# Patient Record
Sex: Female | Born: 1970
Health system: Southern US, Community
[De-identification: ages and names within clinical notes are randomized; demographics above are authoritative.]

## PROBLEM LIST (undated history)

## (undated) DIAGNOSIS — B029 Zoster without complications: Secondary | ICD-10-CM

## (undated) DIAGNOSIS — E7800 Pure hypercholesterolemia, unspecified: Secondary | ICD-10-CM

## (undated) DIAGNOSIS — F419 Anxiety disorder, unspecified: Secondary | ICD-10-CM

## (undated) DIAGNOSIS — E282 Polycystic ovarian syndrome: Secondary | ICD-10-CM

## (undated) DIAGNOSIS — I10 Essential (primary) hypertension: Secondary | ICD-10-CM

## (undated) DIAGNOSIS — F329 Major depressive disorder, single episode, unspecified: Secondary | ICD-10-CM

## (undated) DIAGNOSIS — M1990 Unspecified osteoarthritis, unspecified site: Secondary | ICD-10-CM

## (undated) DIAGNOSIS — N189 Chronic kidney disease, unspecified: Secondary | ICD-10-CM

## (undated) DIAGNOSIS — Z8619 Personal history of other infectious and parasitic diseases: Secondary | ICD-10-CM

## (undated) DIAGNOSIS — F32A Depression, unspecified: Secondary | ICD-10-CM

## (undated) DIAGNOSIS — E739 Lactose intolerance, unspecified: Secondary | ICD-10-CM

## (undated) DIAGNOSIS — M549 Dorsalgia, unspecified: Secondary | ICD-10-CM

## (undated) DIAGNOSIS — M2550 Pain in unspecified joint: Secondary | ICD-10-CM

## (undated) DIAGNOSIS — J302 Other seasonal allergic rhinitis: Secondary | ICD-10-CM

## (undated) DIAGNOSIS — G4730 Sleep apnea, unspecified: Secondary | ICD-10-CM

## (undated) DIAGNOSIS — K5792 Diverticulitis of intestine, part unspecified, without perforation or abscess without bleeding: Secondary | ICD-10-CM

## (undated) DIAGNOSIS — E119 Type 2 diabetes mellitus without complications: Secondary | ICD-10-CM

## (undated) DIAGNOSIS — E785 Hyperlipidemia, unspecified: Secondary | ICD-10-CM

## (undated) DIAGNOSIS — E1165 Type 2 diabetes mellitus with hyperglycemia: Secondary | ICD-10-CM

## (undated) HISTORY — DX: Chronic kidney disease, unspecified: N18.9

## (undated) HISTORY — DX: Type 2 diabetes mellitus without complications: E11.9

## (undated) HISTORY — DX: Major depressive disorder, single episode, unspecified: F32.9

## (undated) HISTORY — DX: Anxiety disorder, unspecified: F41.9

## (undated) HISTORY — DX: Diverticulitis of intestine, part unspecified, without perforation or abscess without bleeding: K57.92

## (undated) HISTORY — DX: Polycystic ovarian syndrome: E28.2

## (undated) HISTORY — DX: Dorsalgia, unspecified: M54.9

## (undated) HISTORY — DX: Lactose intolerance, unspecified: E73.9

## (undated) HISTORY — DX: Depression, unspecified: F32.A

## (undated) HISTORY — DX: Essential (primary) hypertension: I10

## (undated) HISTORY — DX: Other seasonal allergic rhinitis: J30.2

## (undated) HISTORY — DX: Zoster without complications: B02.9

## (undated) HISTORY — DX: Type 2 diabetes mellitus with hyperglycemia: E11.65

## (undated) HISTORY — DX: Hyperlipidemia, unspecified: E78.5

## (undated) HISTORY — DX: Pain in unspecified joint: M25.50

## (undated) HISTORY — DX: Unspecified osteoarthritis, unspecified site: M19.90

## (undated) HISTORY — DX: Personal history of other infectious and parasitic diseases: Z86.19

---
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
## 1998-12-23 ENCOUNTER — Other Ambulatory Visit: Admission: RE | Admit: 1998-12-23 | Discharge: 1998-12-23 | Payer: Self-pay | Admitting: Internal Medicine

## 1998-12-30 ENCOUNTER — Encounter: Admission: RE | Admit: 1998-12-30 | Discharge: 1999-03-30 | Payer: Self-pay | Admitting: Internal Medicine

## 2000-01-09 ENCOUNTER — Other Ambulatory Visit: Admission: RE | Admit: 2000-01-09 | Discharge: 2000-01-09 | Payer: Self-pay | Admitting: Internal Medicine

## 2000-01-17 ENCOUNTER — Encounter: Admission: RE | Admit: 2000-01-17 | Discharge: 2000-04-16 | Payer: Self-pay | Admitting: Internal Medicine

## 2000-12-10 ENCOUNTER — Emergency Department (HOSPITAL_COMMUNITY): Admission: EM | Admit: 2000-12-10 | Discharge: 2000-12-10 | Payer: Self-pay | Admitting: Internal Medicine

## 2001-02-03 ENCOUNTER — Encounter: Payer: Self-pay | Admitting: Emergency Medicine

## 2001-02-03 ENCOUNTER — Emergency Department (HOSPITAL_COMMUNITY): Admission: EM | Admit: 2001-02-03 | Discharge: 2001-02-03 | Payer: Self-pay | Admitting: Unknown Physician Specialty

## 2003-07-02 ENCOUNTER — Encounter (INDEPENDENT_AMBULATORY_CARE_PROVIDER_SITE_OTHER): Payer: Self-pay | Admitting: Specialist

## 2003-07-02 ENCOUNTER — Encounter: Admission: RE | Admit: 2003-07-02 | Discharge: 2003-07-02 | Payer: Self-pay | Admitting: Family Medicine

## 2003-07-05 ENCOUNTER — Encounter: Admission: RE | Admit: 2003-07-05 | Discharge: 2003-07-05 | Payer: Self-pay | Admitting: Family Medicine

## 2003-08-02 ENCOUNTER — Encounter: Admission: RE | Admit: 2003-08-02 | Discharge: 2003-10-31 | Payer: Self-pay | Admitting: Family Medicine

## 2003-10-07 ENCOUNTER — Encounter: Admission: RE | Admit: 2003-10-07 | Discharge: 2003-10-07 | Payer: Self-pay | Admitting: Family Medicine

## 2003-12-29 ENCOUNTER — Encounter: Admission: RE | Admit: 2003-12-29 | Discharge: 2003-12-29 | Payer: Self-pay | Admitting: Sports Medicine

## 2004-03-27 ENCOUNTER — Ambulatory Visit: Payer: Self-pay | Admitting: Sports Medicine

## 2004-05-18 ENCOUNTER — Emergency Department (HOSPITAL_COMMUNITY): Admission: EM | Admit: 2004-05-18 | Discharge: 2004-05-18 | Payer: Self-pay | Admitting: Family Medicine

## 2004-06-01 ENCOUNTER — Ambulatory Visit: Payer: Self-pay | Admitting: Family Medicine

## 2004-07-04 ENCOUNTER — Ambulatory Visit: Payer: Self-pay | Admitting: Family Medicine

## 2004-11-02 ENCOUNTER — Ambulatory Visit: Payer: Self-pay | Admitting: Sports Medicine

## 2004-11-06 ENCOUNTER — Emergency Department (HOSPITAL_COMMUNITY): Admission: EM | Admit: 2004-11-06 | Discharge: 2004-11-06 | Payer: Self-pay | Admitting: Family Medicine

## 2005-02-26 ENCOUNTER — Ambulatory Visit: Payer: Self-pay | Admitting: Family Medicine

## 2005-03-13 ENCOUNTER — Ambulatory Visit: Payer: Self-pay | Admitting: Family Medicine

## 2005-03-18 ENCOUNTER — Emergency Department (HOSPITAL_COMMUNITY): Admission: EM | Admit: 2005-03-18 | Discharge: 2005-03-18 | Payer: Self-pay | Admitting: Emergency Medicine

## 2005-03-26 ENCOUNTER — Ambulatory Visit: Payer: Self-pay | Admitting: Sports Medicine

## 2005-04-02 ENCOUNTER — Ambulatory Visit: Payer: Self-pay | Admitting: Family Medicine

## 2005-04-30 ENCOUNTER — Ambulatory Visit: Payer: Self-pay | Admitting: Family Medicine

## 2005-07-21 ENCOUNTER — Emergency Department (HOSPITAL_COMMUNITY): Admission: EM | Admit: 2005-07-21 | Discharge: 2005-07-21 | Payer: Self-pay | Admitting: Family Medicine

## 2005-07-31 ENCOUNTER — Emergency Department (HOSPITAL_COMMUNITY): Admission: EM | Admit: 2005-07-31 | Discharge: 2005-07-31 | Payer: Self-pay | Admitting: Family Medicine

## 2006-04-27 ENCOUNTER — Encounter (INDEPENDENT_AMBULATORY_CARE_PROVIDER_SITE_OTHER): Payer: Self-pay | Admitting: *Deleted

## 2006-04-27 LAB — CONVERTED CEMR LAB

## 2006-04-28 ENCOUNTER — Emergency Department (HOSPITAL_COMMUNITY): Admission: EM | Admit: 2006-04-28 | Discharge: 2006-04-28 | Payer: Self-pay | Admitting: Family Medicine

## 2006-04-29 ENCOUNTER — Ambulatory Visit: Payer: Self-pay | Admitting: Sports Medicine

## 2006-05-15 ENCOUNTER — Ambulatory Visit: Payer: Self-pay | Admitting: Family Medicine

## 2006-05-15 ENCOUNTER — Other Ambulatory Visit: Admission: RE | Admit: 2006-05-15 | Discharge: 2006-05-15 | Payer: Self-pay | Admitting: Family Medicine

## 2006-05-28 HISTORY — PX: CHOLECYSTECTOMY: SHX55

## 2006-06-20 ENCOUNTER — Encounter: Admission: RE | Admit: 2006-06-20 | Discharge: 2006-09-18 | Payer: Self-pay | Admitting: Family Medicine

## 2006-06-21 ENCOUNTER — Emergency Department (HOSPITAL_COMMUNITY): Admission: EM | Admit: 2006-06-21 | Discharge: 2006-06-21 | Payer: Self-pay | Admitting: Family Medicine

## 2006-07-18 ENCOUNTER — Ambulatory Visit: Payer: Self-pay | Admitting: Family Medicine

## 2006-07-26 ENCOUNTER — Encounter (INDEPENDENT_AMBULATORY_CARE_PROVIDER_SITE_OTHER): Payer: Self-pay | Admitting: *Deleted

## 2006-07-30 ENCOUNTER — Ambulatory Visit: Payer: Self-pay | Admitting: Family Medicine

## 2006-07-30 ENCOUNTER — Encounter: Payer: Self-pay | Admitting: Pharmacist

## 2006-07-30 DIAGNOSIS — IMO0002 Reserved for concepts with insufficient information to code with codable children: Secondary | ICD-10-CM | POA: Insufficient documentation

## 2006-07-30 DIAGNOSIS — E1165 Type 2 diabetes mellitus with hyperglycemia: Secondary | ICD-10-CM

## 2006-07-30 DIAGNOSIS — I10 Essential (primary) hypertension: Secondary | ICD-10-CM | POA: Insufficient documentation

## 2006-07-30 HISTORY — DX: Reserved for concepts with insufficient information to code with codable children: IMO0002

## 2006-07-30 HISTORY — DX: Type 2 diabetes mellitus with hyperglycemia: E11.65

## 2006-08-02 ENCOUNTER — Encounter (INDEPENDENT_AMBULATORY_CARE_PROVIDER_SITE_OTHER): Payer: Self-pay | Admitting: Family Medicine

## 2006-08-02 ENCOUNTER — Ambulatory Visit: Payer: Self-pay | Admitting: Sports Medicine

## 2006-08-02 LAB — CONVERTED CEMR LAB
Cholesterol: 211 mg/dL — ABNORMAL HIGH (ref 0–200)
HDL: 65 mg/dL (ref >39)
Hgb A1c MFr Bld: 8.8 %
LDL Cholesterol: 122 mg/dL — ABNORMAL HIGH (ref 0–99)
Total CHOL/HDL Ratio: 3.2
Triglycerides: 119 mg/dL (ref <150)
VLDL: 24 mg/dL (ref 0–40)

## 2006-08-06 ENCOUNTER — Encounter (INDEPENDENT_AMBULATORY_CARE_PROVIDER_SITE_OTHER): Payer: Self-pay | Admitting: Family Medicine

## 2006-08-06 DIAGNOSIS — E785 Hyperlipidemia, unspecified: Secondary | ICD-10-CM | POA: Insufficient documentation

## 2006-09-06 ENCOUNTER — Telehealth (INDEPENDENT_AMBULATORY_CARE_PROVIDER_SITE_OTHER): Payer: Self-pay | Admitting: Family Medicine

## 2006-09-22 ENCOUNTER — Emergency Department (HOSPITAL_COMMUNITY): Admission: EM | Admit: 2006-09-22 | Discharge: 2006-09-22 | Payer: Self-pay | Admitting: Emergency Medicine

## 2006-09-25 ENCOUNTER — Telehealth: Payer: Self-pay | Admitting: *Deleted

## 2006-09-26 ENCOUNTER — Ambulatory Visit: Payer: Self-pay | Admitting: Sports Medicine

## 2006-09-30 ENCOUNTER — Encounter (INDEPENDENT_AMBULATORY_CARE_PROVIDER_SITE_OTHER): Payer: Self-pay | Admitting: Family Medicine

## 2006-10-03 ENCOUNTER — Ambulatory Visit: Payer: Self-pay | Admitting: Family Medicine

## 2006-10-03 LAB — CONVERTED CEMR LAB

## 2006-10-08 ENCOUNTER — Telehealth: Payer: Self-pay | Admitting: *Deleted

## 2006-10-14 ENCOUNTER — Ambulatory Visit (HOSPITAL_COMMUNITY): Admission: RE | Admit: 2006-10-14 | Discharge: 2006-10-14 | Payer: Self-pay | Admitting: Family Medicine

## 2006-10-14 ENCOUNTER — Encounter (INDEPENDENT_AMBULATORY_CARE_PROVIDER_SITE_OTHER): Payer: Self-pay | Admitting: Family Medicine

## 2006-10-14 ENCOUNTER — Ambulatory Visit: Payer: Self-pay | Admitting: Family Medicine

## 2006-10-17 ENCOUNTER — Encounter: Payer: Self-pay | Admitting: *Deleted

## 2006-10-29 ENCOUNTER — Encounter: Payer: Self-pay | Admitting: *Deleted

## 2007-01-02 ENCOUNTER — Ambulatory Visit: Payer: Self-pay | Admitting: Family Medicine

## 2007-01-03 ENCOUNTER — Encounter: Payer: Self-pay | Admitting: Family Medicine

## 2007-02-03 ENCOUNTER — Ambulatory Visit: Payer: Self-pay | Admitting: Sports Medicine

## 2007-02-03 ENCOUNTER — Encounter: Payer: Self-pay | Admitting: Family Medicine

## 2007-02-03 LAB — CONVERTED CEMR LAB
BUN: 12 mg/dL (ref 6–23)
Calcium: 9.1 mg/dL (ref 8.4–10.5)
Cholesterol: 186 mg/dL (ref 0–200)
Creatinine, Ser: 0.69 mg/dL (ref 0.40–1.20)
Glucose, Bld: 136 mg/dL — ABNORMAL HIGH (ref 70–99)
Hgb A1c MFr Bld: 8.5 %
Triglycerides: 135 mg/dL (ref <150)

## 2007-02-11 ENCOUNTER — Ambulatory Visit: Payer: Self-pay | Admitting: Family Medicine

## 2007-02-11 LAB — CONVERTED CEMR LAB
HDL goal, serum: 40 mg/dL
LDL Goal: 100 mg/dL

## 2007-02-28 ENCOUNTER — Encounter: Payer: Self-pay | Admitting: Family Medicine

## 2007-03-12 ENCOUNTER — Ambulatory Visit: Payer: Self-pay | Admitting: Family Medicine

## 2007-06-27 ENCOUNTER — Telehealth: Payer: Self-pay | Admitting: *Deleted

## 2007-07-02 ENCOUNTER — Encounter: Payer: Self-pay | Admitting: Family Medicine

## 2008-01-02 ENCOUNTER — Encounter: Payer: Self-pay | Admitting: Family Medicine

## 2008-01-27 ENCOUNTER — Telehealth: Payer: Self-pay | Admitting: *Deleted

## 2008-02-18 ENCOUNTER — Telehealth: Payer: Self-pay | Admitting: *Deleted

## 2008-02-18 ENCOUNTER — Encounter: Payer: Self-pay | Admitting: Family Medicine

## 2008-02-18 ENCOUNTER — Ambulatory Visit: Payer: Self-pay | Admitting: Family Medicine

## 2008-02-18 LAB — CONVERTED CEMR LAB
Calcium: 9.3 mg/dL (ref 8.4–10.5)
Cholesterol: 261 mg/dL — ABNORMAL HIGH (ref 0–200)
Glucose, Bld: 155 mg/dL — ABNORMAL HIGH (ref 70–99)
HDL: 53 mg/dL (ref >39)
Hgb A1c MFr Bld: 10 %
Sodium: 138 meq/L (ref 135–145)
Total CHOL/HDL Ratio: 4.9
Triglycerides: 103 mg/dL (ref <150)

## 2008-02-20 ENCOUNTER — Telehealth (INDEPENDENT_AMBULATORY_CARE_PROVIDER_SITE_OTHER): Payer: Self-pay | Admitting: Family Medicine

## 2008-02-20 ENCOUNTER — Ambulatory Visit (HOSPITAL_COMMUNITY): Admission: RE | Admit: 2008-02-20 | Discharge: 2008-02-20 | Payer: Self-pay | Admitting: Family Medicine

## 2008-02-20 DIAGNOSIS — K8020 Calculus of gallbladder without cholecystitis without obstruction: Secondary | ICD-10-CM | POA: Insufficient documentation

## 2008-02-23 ENCOUNTER — Encounter (INDEPENDENT_AMBULATORY_CARE_PROVIDER_SITE_OTHER): Payer: Self-pay | Admitting: *Deleted

## 2008-02-24 ENCOUNTER — Telehealth: Payer: Self-pay | Admitting: *Deleted

## 2008-03-10 ENCOUNTER — Encounter: Payer: Self-pay | Admitting: Family Medicine

## 2008-03-17 ENCOUNTER — Ambulatory Visit: Payer: Self-pay | Admitting: Family Medicine

## 2008-03-17 ENCOUNTER — Encounter: Payer: Self-pay | Admitting: Family Medicine

## 2008-03-17 LAB — CONVERTED CEMR LAB: Whiff Test: NEGATIVE

## 2008-03-20 ENCOUNTER — Encounter: Payer: Self-pay | Admitting: Family Medicine

## 2008-03-23 ENCOUNTER — Telehealth: Payer: Self-pay | Admitting: Family Medicine

## 2008-03-24 ENCOUNTER — Encounter: Payer: Self-pay | Admitting: Family Medicine

## 2008-03-30 ENCOUNTER — Telehealth: Payer: Self-pay | Admitting: *Deleted

## 2008-04-02 ENCOUNTER — Encounter: Payer: Self-pay | Admitting: Family Medicine

## 2008-04-05 ENCOUNTER — Encounter: Payer: Self-pay | Admitting: Family Medicine

## 2008-04-07 ENCOUNTER — Encounter: Payer: Self-pay | Admitting: Family Medicine

## 2008-04-09 ENCOUNTER — Encounter (INDEPENDENT_AMBULATORY_CARE_PROVIDER_SITE_OTHER): Payer: Self-pay | Admitting: General Surgery

## 2008-04-09 ENCOUNTER — Ambulatory Visit (HOSPITAL_COMMUNITY): Admission: RE | Admit: 2008-04-09 | Discharge: 2008-04-10 | Payer: Self-pay | Admitting: General Surgery

## 2008-05-04 ENCOUNTER — Telehealth: Payer: Self-pay | Admitting: *Deleted

## 2008-07-02 ENCOUNTER — Encounter: Payer: Self-pay | Admitting: Family Medicine

## 2008-12-23 ENCOUNTER — Encounter: Payer: Self-pay | Admitting: Family Medicine

## 2009-10-27 ENCOUNTER — Encounter: Payer: Self-pay | Admitting: Family Medicine

## 2010-10-10 NOTE — Op Note (Signed)
Audrey Goodman, Audrey Goodman                ACCOUNT NO.:  [id number]   MEDICAL RECORD NO.:  [id number]          PATIENT TYPE:  OIB   LOCATION:  1523                         FAC[facility]  [facility]   PHYSICIAN:  Juanetta Gosling, MDDATE OF BIRTH:  1970-07-09   DATE OF PROCEDURE:  04/09/2008  DATE OF DISCHARGE:                               OPERATIVE REPORT   PREOPERATIVE DIAGNOSES:  Symptomatic cholelithiasis.   POSTOPERATIVE DIAGNOSES:  Symptomatic cholelithiasis.   PROCEDURE:  Laparoscopic cholecystectomy.   SURGEON:  Juanetta Gosling, M.D.   ASSISTANT:  Dr. Felicity Pellegrini   ANESTHESIA:  General.   FINDINGS:  Chronic inflammation of gallbladder.   SPECIMEN:  Gallbladder and contents to pathology.   ESTIMATED BLOOD LOSS:  Minimal.   COMPLICATIONS:  None.   DRAINS:  None.   DISPOSITION:  To PACU in stable condition.   INDICATIONS:  Audrey Goodman is a morbidly obese, 40 year old female with  a history of hypertension, diabetes, hypercholesterolemia, who has had  intermittent right upper quadrant pain for several months.  This has  been steadily getting worse over this time.  She has an ultrasound that  shows multiple gallstones with no Murphy's sign and gallbladder wall not  thickened.  Common duct was 4 mm.  Her LFTs were normal preoperatively.  Counseled her for laparoscopic cholecystectomy for symptomatic  cholelithiasis.   PROCEDURE:  After informed consent was obtained, the patient was taken  to the operating room.  She was administered 1 g of cefoxitin  preoperatively as well as 5000 units of subcutaneous heparin due to her  morbid obesity.  Sequential compression devices were placed on her lower  extremities.  She then underwent general orotracheal anesthesia with  some difficulty, as her mouth did not open very well.  Following this,  her abdomen was then prepped and draped in standard sterile surgical  fashion.  Surgical time-out was then performed.  Due to her size, I  made  a 5 mm incision in her left upper quadrant and used a 5-mm scope with an  Optiview trocar and entered the abdomen without difficulty.  The abdomen  was then insufflated to 15 mmHg pressure without complication.  I then  made a 10-mm infraumbilical vertical incision and then inserted a 10 mm  trocar under direct vision without complication.  Three other 5 mm  trocars were placed in the epigastrium and right upper quadrant after  infiltration with local anesthetic under direct vision without  complication.   Her gallbladder was then retracted cephalad and lateral.  The  gallbladder was intrahepatic and noted to be scarred in the region of  the triangle of Calot, as well as to the liver throughout, indicating  chronic cholecystitis.  The triangle of Calot was then dissected.  The  cystic artery and cystic duct were completely dissected and the critical  view of safety was obtained with these two structures entering into the  gallbladder with the liver behind that.  Following this dissection, the  cystic duct was then clipped and divided and the cystic artery was then  clipped and divided.  Bovie  electrocautery was then used to remove the  gallbladder from the liver bed.  This was very adherent to the liver and  the liver was entered in several positions during this dissection.  The  gallbladder was then removed.  The gallbladder bed was then visualized.  Electrocautery was then used to control several points of bleeding.  This was then irrigated clear.  No further bleeding was noted.  The  gallbladder was then removed from the liver bed with electrocautery.  A  5-mm scope was then inserted into the epigastrium and an EndoCatch was  inserted through the umbilicus.  The gallbladder was placed in the  umbilicus and this was then removed after enlarging the prior fascial  hole.  This was passed off the table as a specimen.  Following this,  further irrigation was performed.  Hemostasis  was observed.  All the  irrigant was suctioned from the abdomen.  An Endoclose device with a 0  Vicryl suture was then used to close the 10-mm infraumbilical port and  two stitches were used to do this.  Following this, all trocars were  removed.  The abdomen was then desufflated.  All of the trocar sites  were then closed with a 4-0 Monocryl in subcuticular fashion.  Dermabond  was placed over this.  She tolerated this procedure well, was  transferred to the recovery room in stable condition.      Juanetta Gosling, MD  Electronically Signed     MCW/MEDQ  D:  04/09/2008  T:  04/09/2008  Job:  [id number]   cc:   Pearlean Brownie, M.D.

## 2011-02-27 LAB — GLUCOSE, CAPILLARY
Glucose-Capillary: 112 — ABNORMAL HIGH
Glucose-Capillary: 114 — ABNORMAL HIGH
Glucose-Capillary: 150 — ABNORMAL HIGH
Glucose-Capillary: 229 — ABNORMAL HIGH
Glucose-Capillary: 331 — ABNORMAL HIGH

## 2011-02-27 LAB — COMPREHENSIVE METABOLIC PANEL
AST: 17
Alkaline Phosphatase: 72
CO2: 28
Chloride: 102
Creatinine, Ser: 0.83
GFR calc Af Amer: >60
GFR calc non Af Amer: >60
Potassium: 3.7
Total Bilirubin: 0.7

## 2011-02-27 LAB — CBC
HCT: 42
MCV: 88.2
RBC: 4.76
WBC: 8.5

## 2011-02-27 LAB — PREGNANCY, URINE: Preg Test, Ur: NEGATIVE

## 2011-02-27 LAB — DIFFERENTIAL
Basophils Absolute: 0.1
Basophils Relative: 1
Eosinophils Absolute: 0.2
Eosinophils Relative: 2
Lymphocytes Relative: 32
Monocytes Absolute: 0.6

## 2012-09-02 LAB — HM PAP SMEAR: HM PAP: NORMAL

## 2013-04-14 ENCOUNTER — Telehealth: Payer: Self-pay

## 2013-04-14 NOTE — Telephone Encounter (Signed)
New Patient Left message for call back Non identifiable  

## 2013-04-15 ENCOUNTER — Ambulatory Visit: Payer: Self-pay | Admitting: Family Medicine

## 2013-04-15 DIAGNOSIS — Z0289 Encounter for other administrative examinations: Secondary | ICD-10-CM

## 2013-04-16 NOTE — Telephone Encounter (Signed)
Unable to reach pre visit//no show 

## 2013-07-14 ENCOUNTER — Ambulatory Visit: Payer: Self-pay | Admitting: Family Medicine

## 2013-09-09 ENCOUNTER — Telehealth: Payer: Self-pay

## 2013-09-09 NOTE — Telephone Encounter (Signed)
Number has been disconnected.

## 2013-09-10 ENCOUNTER — Encounter: Payer: Self-pay | Admitting: Family Medicine

## 2013-09-10 ENCOUNTER — Ambulatory Visit (INDEPENDENT_AMBULATORY_CARE_PROVIDER_SITE_OTHER): Payer: 59 | Admitting: Family Medicine

## 2013-09-10 ENCOUNTER — Encounter: Payer: Self-pay | Admitting: General Practice

## 2013-09-10 ENCOUNTER — Other Ambulatory Visit: Payer: Self-pay | Admitting: Family Medicine

## 2013-09-10 VITALS — BP 132/86 | HR 81 | Temp 98.2°F | Resp 16 | Ht 65.75 in | Wt 396.1 lb

## 2013-09-10 DIAGNOSIS — E282 Polycystic ovarian syndrome: Secondary | ICD-10-CM | POA: Insufficient documentation

## 2013-09-10 DIAGNOSIS — E1165 Type 2 diabetes mellitus with hyperglycemia: Principal | ICD-10-CM

## 2013-09-10 DIAGNOSIS — IMO0001 Reserved for inherently not codable concepts without codable children: Secondary | ICD-10-CM

## 2013-09-10 DIAGNOSIS — E785 Hyperlipidemia, unspecified: Secondary | ICD-10-CM

## 2013-09-10 DIAGNOSIS — I10 Essential (primary) hypertension: Secondary | ICD-10-CM

## 2013-09-10 LAB — BASIC METABOLIC PANEL
BUN: 15 mg/dL (ref 6–23)
CO2: 27 mEq/L (ref 19–32)
Calcium: 8.9 mg/dL (ref 8.4–10.5)
Chloride: 100 mEq/L (ref 96–112)
Creatinine, Ser: 0.8 mg/dL (ref 0.4–1.2)
GFR: 97.04 mL/min (ref >60.00)
GLUCOSE: 199 mg/dL — AB (ref 70–99)
POTASSIUM: 3.8 meq/L (ref 3.5–5.1)
Sodium: 136 mEq/L (ref 135–145)

## 2013-09-10 LAB — CBC WITH DIFFERENTIAL/PLATELET
BASOS ABS: 0 10*3/uL (ref 0.0–0.1)
Basophils Relative: 0.5 % (ref 0.0–3.0)
Eosinophils Absolute: 0.3 10*3/uL (ref 0.0–0.7)
Eosinophils Relative: 3.6 % (ref 0.0–5.0)
HCT: 39.8 % (ref 36.0–46.0)
HEMOGLOBIN: 13.2 g/dL (ref 12.0–15.0)
LYMPHS PCT: 33.9 % (ref 12.0–46.0)
Lymphs Abs: 2.5 10*3/uL (ref 0.7–4.0)
MCHC: 33.1 g/dL (ref 30.0–36.0)
MCV: 87.5 fl (ref 78.0–100.0)
MONOS PCT: 6.9 % (ref 3.0–12.0)
Monocytes Absolute: 0.5 10*3/uL (ref 0.1–1.0)
NEUTROS PCT: 55.1 % (ref 43.0–77.0)
Neutro Abs: 4.1 10*3/uL (ref 1.4–7.7)
PLATELETS: 237 10*3/uL (ref 150.0–400.0)
RBC: 4.55 Mil/uL (ref 3.87–5.11)
RDW: 14 % (ref 11.5–14.6)
WBC: 7.5 10*3/uL (ref 4.5–10.5)

## 2013-09-10 LAB — HEPATIC FUNCTION PANEL
ALBUMIN: 3.4 g/dL — AB (ref 3.5–5.2)
ALK PHOS: 68 U/L (ref 39–117)
ALT: 16 U/L (ref 0–35)
AST: 16 U/L (ref 0–37)
Bilirubin, Direct: 0 mg/dL (ref 0.0–0.3)
TOTAL PROTEIN: 7.2 g/dL (ref 6.0–8.3)
Total Bilirubin: 0.9 mg/dL (ref 0.3–1.2)

## 2013-09-10 LAB — TSH: TSH: 0.81 u[IU]/mL (ref 0.35–5.50)

## 2013-09-10 LAB — LIPID PANEL
CHOLESTEROL: 142 mg/dL (ref 0–200)
HDL: 45.7 mg/dL (ref >39.00)
LDL Cholesterol: 75 mg/dL (ref 0–99)
Total CHOL/HDL Ratio: 3
Triglycerides: 107 mg/dL (ref 0.0–149.0)
VLDL: 21.4 mg/dL (ref 0.0–40.0)

## 2013-09-10 LAB — HEMOGLOBIN A1C: HEMOGLOBIN A1C: 9.3 % — AB (ref 4.6–6.5)

## 2013-09-10 NOTE — Assessment & Plan Note (Signed)
New.  Pt already on Metformin.  May need to switch Lasix to Spironolactone.  Will follow.

## 2013-09-10 NOTE — Progress Notes (Signed)
Pre visit review using our clinic review tool, if applicable. No additional management support is needed unless otherwise documented below in the visit note. 

## 2013-09-10 NOTE — Assessment & Plan Note (Signed)
New to provider, ongoing for pt.  On Crestor and Zetia w/o difficulty.  Check labs.  Adjust meds prn

## 2013-09-10 NOTE — Assessment & Plan Note (Signed)
New to provider, ongoing for pt.  Already taking 60 units of Novolog + SSI.  Depending on A1C level, may need endocrinology referral.  Pt aware and in agreement w/ plan.

## 2013-09-10 NOTE — Assessment & Plan Note (Signed)
New to provider, adequate but not ideal control.  Currently asymptomatic.  Check labs.  No anticipated med changes.  Due to pt's PCOS, may be reasonable to change lasix to spironolactone.  Will follow.

## 2013-09-10 NOTE — Patient Instructions (Signed)
Follow up in 3 months to recheck sugars We'll notify you of your lab results and make any changes if needed Try and eat a low carb diet and get regular exercise- this is very important Once we get your lab results, we'll determine the next steps Call with any questions or concerns Hang in there!  We'll figure this out!!

## 2013-09-10 NOTE — Assessment & Plan Note (Signed)
New to provider.  This is pt's biggest health issue.  Stressed need for healthy diet and regular exercise.  Will follow.

## 2013-09-10 NOTE — Progress Notes (Signed)
   Subjective:    Patient ID: Audrey Goodman, female    DOB: 11/08/1970, 43 y.o.   MRN: [id number]  HPI New to establish.  Previous MD- Dr Marquis BuggyVang in [place]  DM- chronic problem, dx'd 1995.  Currently on Novolog 20 units each meal w/ additional SSI and Lantus 60 units QAM.  Also on Metformin.  On Lisinopril for renal protection.  UTD on eye exam ([facility]).  No neuropathy.  Denies symptomatic lows.  No CP, SOB, HAs, visual changes, N/V/D, edema.  HTN- chronic problem, adequate control on Lasix, HCTZ, Lisinopril.  Currently asymptomatic  Hyperlipidemia- chronic problem, on Crestor and Zetia.  Denies abd pain, N/V, myalgias.  Obesity- current BMI is 64.4.  Admits to no regular exercise.  Not following low carb diet, 'eating what i want right now'.   Review of Systems For ROS see HPI     Objective:   Physical Exam  Vitals reviewed. Constitutional: She is oriented to person, place, and time. She appears well-developed and well-nourished. No distress.  Morbidly obese  HENT:  Head: Normocephalic and atraumatic.  Eyes: Conjunctivae and EOM are normal. Pupils are equal, round, and reactive to light.  Neck: Normal range of motion. Neck supple. No thyromegaly present.  Cardiovascular: Normal rate, regular rhythm, normal heart sounds and intact distal pulses.   No murmur heard. Pulmonary/Chest: Effort normal and breath sounds normal. No respiratory distress.  Abdominal: Soft. She exhibits no distension. There is no tenderness.  Musculoskeletal: She exhibits edema (1+ pitting edema).  Lymphadenopathy:    She has no cervical adenopathy.  Neurological: She is alert and oriented to person, place, and time.  Skin: Skin is warm and dry.  Psychiatric: She has a normal mood and affect. Her behavior is normal.          Assessment & Plan:

## 2013-09-11 ENCOUNTER — Other Ambulatory Visit: Payer: Self-pay | Admitting: Family Medicine

## 2013-09-17 ENCOUNTER — Encounter: Payer: Self-pay | Admitting: Family Medicine

## 2013-09-18 MED ORDER — INSULIN GLARGINE 100 UNIT/ML SOLOSTAR PEN: 60.0000 [IU] | PEN_INJECTOR | Freq: Every day | SUBCUTANEOUS | Status: DC

## 2013-09-18 NOTE — Telephone Encounter (Signed)
Medication filled and pt notified.  

## 2013-09-22 ENCOUNTER — Ambulatory Visit: Payer: 59 | Admitting: Internal Medicine

## 2013-09-23 ENCOUNTER — Telehealth: Payer: Self-pay

## 2013-09-23 NOTE — Telephone Encounter (Signed)
Relevant patient education assigned to patient using Emmi. ° °

## 2013-10-12 ENCOUNTER — Telehealth: Payer: Self-pay | Admitting: *Deleted

## 2013-10-12 ENCOUNTER — Encounter: Payer: Self-pay | Admitting: Internal Medicine

## 2013-10-12 ENCOUNTER — Encounter: Payer: Self-pay | Admitting: Family Medicine

## 2013-10-12 ENCOUNTER — Other Ambulatory Visit: Payer: Self-pay | Admitting: *Deleted

## 2013-10-12 ENCOUNTER — Ambulatory Visit (INDEPENDENT_AMBULATORY_CARE_PROVIDER_SITE_OTHER): Payer: 59 | Admitting: Internal Medicine

## 2013-10-12 VITALS — BP 122/76 | HR 86 | Temp 98.4°F | Resp 16 | Ht 65.0 in | Wt 390.0 lb

## 2013-10-12 DIAGNOSIS — E1165 Type 2 diabetes mellitus with hyperglycemia: Secondary | ICD-10-CM

## 2013-10-12 DIAGNOSIS — E282 Polycystic ovarian syndrome: Secondary | ICD-10-CM

## 2013-10-12 DIAGNOSIS — IMO0001 Reserved for inherently not codable concepts without codable children: Secondary | ICD-10-CM

## 2013-10-12 MED ORDER — INSULIN ASPART 100 UNIT/ML FLEXPEN: PEN_INJECTOR | SUBCUTANEOUS | Status: DC

## 2013-10-12 MED ORDER — SPIRONOLACTONE 50 MG PO TABS: 50.0000 mg | ORAL_TABLET | Freq: Two times a day (BID) | ORAL | Status: DC

## 2013-10-12 MED ORDER — INSULIN GLARGINE 100 UNIT/ML SOLOSTAR PEN: 50.0000 [IU] | PEN_INJECTOR | Freq: Every day | SUBCUTANEOUS | Status: DC

## 2013-10-12 NOTE — Telephone Encounter (Signed)
Pt wants rx for insulins to be sent to [facility].

## 2013-10-12 NOTE — Progress Notes (Signed)
Patient ID: Audrey CruelLena C Tello, female   DOB: 1971-01-29, 43 y.o.   MRN: [id number]  HPI: Audrey Goodman is a 43 y.o.-year-old female, referred by her PCP, Dr. Beverely Lowabori, for management of DM2, insulin-dependent, uncontrolled, without complications and also in consultation for possible PCOS.  Patient has been diagnosed with GDM in 1995, then diabetes 6 mo after son was born; she started insulin ~2010. Last hemoglobin A1c was: Lab Results  Component Value Date   HGBA1C 9.3* 09/10/2013   HGBA1C 10.0 02/18/2008   HGBA1C 8.5 02/03/2007   Pt is on a regimen of: - Metformin 850 mg po bid - Lantus 60 units qam - does not miss usually - Novolog 20 units tid ac - usually misses it 3-4x a week - NovoLog SSI: target 150 ISF 50 - uses it for every meal  Pt checks her sugars 1-3x a day and they are: - am: 120-200 - 2h after b'fast: n/c - before lunch: (90) 120-225 (after a snack or carb-loaded b'fast) - 2h after lunch: n/c - before dinner: 120-220 - 2h after dinner: n/c - bedtime: n/c - nighttime: n/c Can have lows at night (1-2x a week). Lowest sugar was 67 >> wakes her up; she has hypoglycemia awareness at 80.  Highest sugar was 300s.  Pt's meals are: - Breakfast: fast food, or yoghurt, or sometimes skips - Lunch: takeout or cooked: meat + veggies + starch - Dinner:  meat + veggies + starch - Snacks: apple sauce, fruit, PB and crackers Sodas: regular - most days  - no CKD, last BUN/creatinine:  Lab Results  Component Value Date   BUN 15 09/10/2013   CREATININE 0.8 09/10/2013  On Lisinopril. - last set of lipids: Lab Results  Component Value Date   CHOL 142 09/10/2013   HDL 45.70 09/10/2013   LDLCALC 75 09/10/2013   TRIG 107.0 09/10/2013   CHOLHDL 3 09/10/2013  On Zetia, Crestor 40.  - last eye exam was in 04/2013. No DR.  - no numbness and tingling in her feet.  Pt has FH of DM in [place], [place], uncles, aunts.   PCOS: - has had irregular menses since menarche - on Mirena IUD - for ~4 years - had  1 pregnancy, no miscarriages - + significant hirsutism  ROS: Constitutional: +weight gain, + fatigue, no subjective hyperthermia/hypothermia Eyes: no blurry vision, no xerophthalmia ENT: no sore throat, no nodules palpated in throat, no dysphagia/odynophagia, no hoarseness Cardiovascular: no CP/+ SOB/no palpitations/+ leg swelling Respiratory: no cough/+ SOB Gastrointestinal: no N/V/D/C Musculoskeletal: no muscle/+ joint aches Skin: no rashes, +++ excess hair on chin and chest; no acne Neurological: no tremors/numbness/tingling/dizziness Psychiatric: no depression/anxiety + low libido   Past Medical History  Diagnosis Date  . Arthritis   . History of chicken pox   . Depression   . Diabetes mellitus without complication   . Diverticulitis   . Hypertension   . Hyperlipidemia   . Shingles    Past Surgical History  Procedure Laterality Date  . Cholecystectomy  2008  . Ankle fracture surgery  1991   History   Social History  . Marital Status: Single    Spouse Name: N/A    Number of Children: 1   Occupational History  . Claims agent   Social History Main Topics  . Smoking status: Never Smoker   . Smokeless tobacco: Never Used  . Alcohol Use: Yes, socially  . Drug Use: No  . Sexual Activity: No   Current Outpatient Prescriptions on  File Prior to Visit  Medication Sig Dispense Refill  . escitalopram (LEXAPRO) 10 MG tablet Take 10 mg by mouth daily.      Marland Kitchen ezetimibe (ZETIA) 10 MG tablet Take 10 mg by mouth daily.      . furosemide (LASIX) 40 MG tablet Take 40 mg by mouth daily as needed.      . hydrochlorothiazide (HYDRODIURIL) 25 MG tablet Take 25 mg by mouth daily.      Marland Kitchen levonorgestrel (MIRENA) 20 MCG/24HR IUD 1 each by Intrauterine route once.      Marland Kitchen lisinopril (PRINIVIL,ZESTRIL) 40 MG tablet Take 40 mg by mouth daily.      . metFORMIN (GLUCOPHAGE) 850 MG tablet Take 850 mg by mouth 2 (two) times daily with a meal.      . naproxen (NAPROSYN) 500 MG tablet Take 500  mg by mouth 2 (two) times daily with a meal.      . rosuvastatin (CRESTOR) 40 MG tablet Take 40 mg by mouth daily.       No current facility-administered medications on file prior to visit.   No Known Allergies Family History  Problem Relation Age of Onset  . Cancer Mother     breast  . Hyperlipidemia Mother   . Heart disease Mother   . Hypertension Mother   . Mental illness Mother     depression  . Diabetes Mother   . Hyperlipidemia Father   . Stroke Father   . Hypertension Father   . Diabetes Maternal Uncle   . Cancer Maternal Grandmother     colon  . Diabetes Maternal Grandmother    PE: BP 122/76  Pulse 86  Temp(Src) 98.4 F (36.9 C) (Oral)  Resp 16  Ht 5\' 5"  (1.651 m)  Wt 390 lb (176.903 kg)  BMI 64.90 kg/m2  SpO2 98% Wt Readings from Last 3 Encounters:  10/12/13 390 lb (176.903 kg)  09/10/13 396 lb 2 oz (179.681 kg)  03/17/08 327 lb 8 oz (148.553 kg)   Constitutional: obese, in NAD Eyes: PERRLA, EOMI, no exophthalmos ENT: moist mucous membranes, no thyromegaly, no cervical lymphadenopathy Cardiovascular: RRR, No MRG Respiratory: CTA B Gastrointestinal: abdomen soft, NT, ND, BS+ Musculoskeletal: no deformities, strength intact in all 4 Skin: moist, warm, + rash on chin from shaving; +++ hirsutism on chin and chest; no acne Neurological: no tremor with outstretched hands, DTR normal in all 4  ASSESSMENT: 1. DM2, insulin-dependent, uncontrolled, without complications  PLAN:  1. DM2 Patient with long-standing, uncontrolled diabetes, on basal-bolus insulin regimen, with intermittent noncompliance - We discussed about options for treatment, and I suggested to:  Patient Instructions  Please continue Metformin 850 mg 2x a day. Decrease Lantus to 50 units in am  Change the Novolog insulin with meals as follows: - smaller meal: 20 units  - regular meal: 25 units - large meal: 30 units - Change the NovoLog SSI as follows: - 150- 165: + 1 unit  - 166- 180: +  2 units  - 181- 195: + 3 units  - 196- 210: + 4 units  - 211- 225: + 5 units  - 226- 240: + 6 units  - 241- 255: + 7 units  - >255: + 8 units  Please do not miss any NovoLog injections.  When injecting insulin:  Inject in the abdomen  Rotate the injection sites around the belly button  Change needle for each injection  Keep needle in for 10 sec after last unit of insulin in  Keep the insulin in use out of the fridge - Strongly advised her to start checking sugars at different times of the day - check 3 times a day, rotating checks - given sugar log and advised how to fill it and to bring it at next appt  - given foot care handout and explained the principles  - given instructions for hypoglycemia management "15-15 rule"  - advised for yearly eye exams, she is up to date - Return to clinic in 1 mo with sugar log   2. PCOS - pt with clinically evident PCOS.  - cannot test LH and FSH since on Mirena. - she will need to have the IUD out soon >> I suggested an OCP with estrogen to help with her hirsutism - will check testosterone, TFTs and 17 HO Pg - will start Spironolactone (see dose below) - will come back for nurse visit for BP in 1 week; will also check potassium then - advised to strict contraception while on Spironolactone - continue Metformin (see dose below): Patient Instructions  Please continue Metformin 850 mg 2x a day. Please start Spironolactone 50 mg 2x a day. Come back in 7 days for a nurse visit for BP check and for potasium check.  - time spent with the patient: 60 min, of which >50% was spent in obtaining information about her symptoms, diabetes control, reviewing her previous labs, evaluations, and treatments, counseling her about both DM2 and PCOS (please see the discussed topics above), and designing new tx plans for both conditions.  Component     Latest Ref Rng 10/16/2013  Testosterone     10 - 70 ng/dL 54  Sex Hormone Binding     18 - 114 nmol/L 12  (L)  Testosterone Free     0.6 - 6.8 pg/mL 15.5 (H)  Testosterone-% Free     0.4 - 2.4 % 2.9 (H)  TSH     0.35 - 4.50 uIU/mL 0.54  17-OH-Progesterone, LC/MS/MS      <8  Free T4     0.60 - 1.60 ng/dL 5.280.84  T3, Free     2.3 - 4.2 pg/mL 3.0   Labs confirm PCOS (high testosterone, low 17-HO Prog, normal TFTs). See plan above.

## 2013-10-12 NOTE — Patient Instructions (Addendum)
Please continue Metformin 850 mg 2x a day. Decrease Lantus to 50 units in am  Change the Novolog insulin with meals as follows: - smaller meal: 20 units  - regular meal: 25 units - large meal: 30 units - Change the NovoLog SSI as follows: - 150- 165: + 1 unit  - 166- 180: + 2 units  - 181- 195: + 3 units  - 196- 210: + 4 units  - 211- 225: + 5 units  - 226- 240: + 6 units  - 241- 255: + 7 units  - >255: + 8 units  Please do not miss any NovoLog injections.  When injecting insulin:  Inject in the abdomen  Rotate the injection sites around the belly button  Change needle for each injection  Keep needle in for 10 sec after last unit of insulin in  Keep the insulin in use out of the fridge  Please start Spironolactone 50 mg 2x a day.  Come back in 7 days for a nurse visit for BP check and for potasium check.  Please return in 1 month with your sugar log.   PATIENT INSTRUCTIONS FOR TYPE 2 DIABETES:  DIET AND EXERCISE Diet and exercise is an important part of diabetic treatment.  We recommended aerobic exercise in the form of brisk walking (working between 40-60% of maximal aerobic capacity, similar to brisk walking) for 150 minutes per week (such as 30 minutes five days per week) along with 3 times per week performing 'resistance' training (using various gauge rubber tubes with handles) 5-10 exercises involving the major muscle groups (upper body, lower body and core) performing 10-15 repetitions (or near fatigue) each exercise. Start at half the above goal but build slowly to reach the above goals. If limited by weight, joint pain, or disability, we recommend daily walking in a swimming pool with water up to waist to reduce pressure from joints while allow for adequate exercise.    BLOOD GLUCOSES Monitoring your blood glucoses is important for continued management of your diabetes. Please check your blood glucoses 2-4 times a day: fasting, before meals and at bedtime (you can  rotate these measurements - e.g. one day check before the 3 meals, the next day check before 2 of the meals and before bedtime, etc.).   HYPOGLYCEMIA (low blood sugar) Hypoglycemia is usually a reaction to not eating, exercising, or taking too much insulin/ other diabetes drugs.  Symptoms include tremors, sweating, hunger, confusion, headache, etc. Treat IMMEDIATELY with 15 grams of Carbs:   4 glucose tablets    cup regular juice/soda   2 tablespoons raisins   4 teaspoons sugar   1 tablespoon honey Recheck blood glucose in 15 mins and repeat above if still symptomatic/blood glucose <100.  RECOMMENDATIONS TO REDUCE YOUR RISK OF DIABETIC COMPLICATIONS: * Take your prescribed MEDICATION(S) * Follow a DIABETIC diet: Complex carbs, fiber rich foods, (monounsaturated and polyunsaturated) fats * AVOID saturated/trans fats, high fat foods, >2,300 mg salt per day. * EXERCISE at least 5 times a week for 30 minutes or preferably daily.  * DO NOT SMOKE OR DRINK more than 1 drink a day. * Check your FEET every day. Do not wear tightfitting shoes. Contact us if you develop an ulcer * See your EYE doctor once a year or more if needed * Get a FLU shot once a year * Get a PNEUMONIA vaccine once before and once after age 42 years  GOALS:  * Your Hemoglobin A1c of <7%  * fasting  sugars need to be <130 * after meals sugars need to be <180 (2h after you start eating) * Your Systolic BP should be 140 or lower  * Your Diastolic BP should be 80 or lower  * Your HDL (Good Cholesterol) should be 40 or higher  * Your LDL (Bad Cholesterol) should be 100 or lower. * Your Triglycerides should be 150 or lower  * Your Urine microalbumin (kidney function) should be <30 * Your Body Mass Index should be 25 or lower   We will be glad to help you achieve these goals. Our telephone number is: [phone].

## 2013-10-12 NOTE — Telephone Encounter (Signed)
Called pt per Dr Charlean SanfilippoGherghe's note: The testosterone needs to be drawn before then. Please call her to not start spironolactone until we get the testosterone. The rest of the labs are ok to be drawn later. Had to lvm to come back and have testosterone drawn. Be advised.

## 2013-10-13 ENCOUNTER — Other Ambulatory Visit: Payer: Self-pay | Admitting: General Practice

## 2013-10-13 MED ORDER — EZETIMIBE 10 MG PO TABS: 10.0000 mg | ORAL_TABLET | Freq: Every day | ORAL | Status: DC

## 2013-10-13 MED ORDER — HYDROCHLOROTHIAZIDE 25 MG PO TABS: 25.0000 mg | ORAL_TABLET | Freq: Every day | ORAL | Status: DC

## 2013-10-13 MED ORDER — ROSUVASTATIN CALCIUM 40 MG PO TABS: 40.0000 mg | ORAL_TABLET | Freq: Every day | ORAL | Status: DC

## 2013-10-13 NOTE — Telephone Encounter (Signed)
Referral placed and pt notified

## 2013-10-16 ENCOUNTER — Other Ambulatory Visit: Payer: Self-pay | Admitting: *Deleted

## 2013-10-16 ENCOUNTER — Other Ambulatory Visit (INDEPENDENT_AMBULATORY_CARE_PROVIDER_SITE_OTHER): Payer: 59

## 2013-10-16 DIAGNOSIS — E282 Polycystic ovarian syndrome: Secondary | ICD-10-CM

## 2013-10-16 DIAGNOSIS — E876 Hypokalemia: Secondary | ICD-10-CM

## 2013-10-16 LAB — T4, FREE: FREE T4: 0.84 ng/dL (ref 0.60–1.60)

## 2013-10-16 LAB — T3, FREE: T3, Free: 3 pg/mL (ref 2.3–4.2)

## 2013-10-16 LAB — TSH: TSH: 0.54 u[IU]/mL (ref 0.35–4.50)

## 2013-10-16 MED ORDER — INSULIN ASPART 100 UNIT/ML FLEXPEN: PEN_INJECTOR | SUBCUTANEOUS | Status: DC

## 2013-10-16 MED ORDER — INSULIN GLARGINE 100 UNIT/ML SOLOSTAR PEN: PEN_INJECTOR | SUBCUTANEOUS | Status: DC

## 2013-10-16 NOTE — Telephone Encounter (Signed)
Had to resend rx for more clarification to [facility].

## 2013-10-20 ENCOUNTER — Other Ambulatory Visit: Payer: 59

## 2013-10-20 LAB — TESTOSTERONE, FREE, TOTAL, SHBG
SEX HORMONE BINDING: 12 nmol/L — AB (ref 18–114)
TESTOSTERONE FREE: 15.5 pg/mL — AB (ref 0.6–6.8)
Testosterone-% Free: 2.9 % — ABNORMAL HIGH (ref 0.4–2.4)
Testosterone: 54 ng/dL (ref 10–70)

## 2013-10-21 LAB — 17-HYDROXYPROGESTERONE: 17-OH-Progesterone, LC/MS/MS: <8 ng/dL

## 2013-10-23 ENCOUNTER — Other Ambulatory Visit (INDEPENDENT_AMBULATORY_CARE_PROVIDER_SITE_OTHER): Payer: 59

## 2013-10-23 ENCOUNTER — Ambulatory Visit: Payer: 59

## 2013-10-23 DIAGNOSIS — E876 Hypokalemia: Secondary | ICD-10-CM

## 2013-10-23 LAB — POTASSIUM: Potassium: 4.4 mEq/L (ref 3.5–5.1)

## 2013-10-28 ENCOUNTER — Encounter: Payer: Self-pay | Admitting: General Practice

## 2013-11-17 ENCOUNTER — Ambulatory Visit: Payer: 59 | Admitting: Internal Medicine

## 2013-11-19 ENCOUNTER — Ambulatory Visit: Payer: Self-pay | Admitting: Obstetrics & Gynecology

## 2013-11-20 ENCOUNTER — Encounter: Payer: Self-pay | Admitting: Internal Medicine

## 2013-12-10 ENCOUNTER — Other Ambulatory Visit: Payer: Self-pay | Admitting: *Deleted

## 2013-12-10 ENCOUNTER — Ambulatory Visit: Payer: 59 | Admitting: Family Medicine

## 2013-12-10 MED ORDER — INSULIN GLARGINE 100 UNIT/ML SOLOSTAR PEN: PEN_INJECTOR | SUBCUTANEOUS | Status: DC

## 2014-01-15 ENCOUNTER — Telehealth: Payer: Self-pay | Admitting: *Deleted

## 2014-01-15 NOTE — Telephone Encounter (Signed)
Left message on machine for patient to call and schedule an appointment to follow up with diabetes.

## 2014-01-29 ENCOUNTER — Ambulatory Visit: Payer: 59 | Admitting: Internal Medicine

## 2014-01-29 ENCOUNTER — Other Ambulatory Visit: Payer: Self-pay | Admitting: *Deleted

## 2014-01-29 MED ORDER — METFORMIN HCL 850 MG PO TABS: 850.0000 mg | ORAL_TABLET | Freq: Two times a day (BID) | ORAL | Status: DC

## 2014-01-29 MED ORDER — LISINOPRIL 40 MG PO TABS: 40.0000 mg | ORAL_TABLET | Freq: Every day | ORAL | Status: DC

## 2014-02-01 ENCOUNTER — Other Ambulatory Visit: Payer: Self-pay | Admitting: Family Medicine

## 2014-02-01 ENCOUNTER — Other Ambulatory Visit: Payer: Self-pay | Admitting: Internal Medicine

## 2014-02-03 NOTE — Telephone Encounter (Signed)
Med filled.  

## 2014-02-12 ENCOUNTER — Encounter: Payer: Self-pay | Admitting: Internal Medicine

## 2014-02-15 ENCOUNTER — Other Ambulatory Visit: Payer: Self-pay | Admitting: *Deleted

## 2014-02-15 MED ORDER — GLUCOSE BLOOD VI STRP: ORAL_STRIP | Status: DC

## 2014-02-24 ENCOUNTER — Encounter: Payer: Self-pay | Admitting: Family Medicine

## 2014-03-12 ENCOUNTER — Other Ambulatory Visit: Payer: Self-pay

## 2014-03-15 ENCOUNTER — Telehealth: Payer: Self-pay | Admitting: *Deleted

## 2014-03-15 NOTE — Telephone Encounter (Signed)
Received medical record request via fax from [employer] Form [facility]. [id number]//[id number]

## 2014-03-22 ENCOUNTER — Encounter: Payer: Self-pay | Admitting: Internal Medicine

## 2014-03-22 ENCOUNTER — Ambulatory Visit (INDEPENDENT_AMBULATORY_CARE_PROVIDER_SITE_OTHER): Payer: 59 | Admitting: Internal Medicine

## 2014-03-22 VITALS — BP 118/68 | HR 89 | Temp 99.0°F | Resp 12 | Wt 375.0 lb

## 2014-03-22 DIAGNOSIS — E282 Polycystic ovarian syndrome: Secondary | ICD-10-CM

## 2014-03-22 DIAGNOSIS — E1165 Type 2 diabetes mellitus with hyperglycemia: Secondary | ICD-10-CM

## 2014-03-22 DIAGNOSIS — IMO0002 Reserved for concepts with insufficient information to code with codable children: Secondary | ICD-10-CM

## 2014-03-22 MED ORDER — INSULIN ASPART 100 UNIT/ML FLEXPEN: PEN_INJECTOR | SUBCUTANEOUS | Status: DC

## 2014-03-22 MED ORDER — INSULIN GLARGINE 100 UNIT/ML SOLOSTAR PEN: PEN_INJECTOR | SUBCUTANEOUS | Status: DC

## 2014-03-22 NOTE — Progress Notes (Signed)
Patient ID: Audrey Goodman, female   DOB: [birth year], 43 y.o.   MRN: [id number]  HPI: Audrey Goodman is a 43 y.o.-year-old female, returning for f/u for DM2, dx with GDM in 1995, then DM 6 mo after son was born, insulin-dependent since ~2010, uncontrolled, without complications and PCOS.  She has a URI.  Patient has been ; she started insulin   Last hemoglobin A1c was: Lab Results  Component Value Date   HGBA1C 9.3* 09/10/2013   HGBA1C 10.0 02/18/2008   HGBA1C 8.5 02/03/2007   Pt is on a regimen of: - Metformin 850 mg 2x a day - Lantus 60 >> 50 units qam - Novolog insulin with meals as follows: - smaller meal: 20 units  - regular meal: 25 units - large meal: 30 units - NovoLog SSI: - 150- 165: + 1 unit  - 166- 180: + 2 units  - 181- 195: + 3 units  - 196- 210: + 4 units  - 211- 225: + 5 units  - 226- 240: + 6 units  - 241- 255: + 7 units  - >255: + 8 units   Pt checks her sugars 1-3x a day and they are improved: - am: 120-200 >> 90-150 - 2h after b'fast: n/c - before lunch: (90) 120-225 (after a snack or carb-loaded b'fast) >> 150-160 - 2h after lunch: n/c >> 113 - before dinner: 120-220 >> 150s (rarely 200) - 2h after dinner: n/c - bedtime: n/c - nighttime: n/c Can have lows at night (1-2x a week). Lowest sugar was 90; she has hypoglycemia awareness at 80.  Highest sugar was 200s.  Pt's meals are: - Breakfast: fast food, or yoghurt, or sometimes skips - Lunch: takeout or cooked: meat + veggies + starch - Dinner:  meat + veggies + starch - Snacks: apple sauce, fruit, PB and crackers Sodas: regular - most days  - no CKD, last BUN/creatinine:  Lab Results  Component Value Date   BUN 15 09/10/2013   CREATININE 0.8 09/10/2013  On Lisinopril. - last set of lipids: Lab Results  Component Value Date   CHOL 142 09/10/2013   HDL 45.70 09/10/2013   LDLCALC 75 09/10/2013   TRIG 107.0 09/10/2013   CHOLHDL 3 09/10/2013  On Zetia, Crestor 40.  - last eye exam was in 04/2013. No  DR.  - no numbness and tingling in her feet.  She is thinking about GBP.  PCOS: - has had irregular menses since menarche - on Mirena IUD - for ~4 years - had 1 pregnancy, no miscarriages - + significant hirsutism - on Metformin 850 mg bid - On Spironolactone 50 mg bid  - tolerates it well. BP normal. Last K normal: Lab Results  Component Value Date   K 4.4 10/23/2013   Lost 15 lbs since last visit!  ROS: Constitutional: + weight loss, + fatigue, no subjective hyperthermia/hypothermia Eyes: no blurry vision, no xerophthalmia ENT: no sore throat, no nodules palpated in throat, no dysphagia/odynophagia, no hoarseness Cardiovascular: no CP/+ SOB/no palpitations/leg swelling Respiratory: + cough/+ SOB Gastrointestinal: no N/V/D/C Musculoskeletal: no muscle/joint aches Skin: no rashes, +++ excess hair on chin and chest; no acne Neurological: no tremors/numbness/tingling/dizziness  PE: BP 118/68  Pulse 89  Temp(Src) 99 F (37.2 C) (Oral)  Resp 12  Wt 375 lb (170.099 kg)  SpO2 98% Wt Readings from Last 3 Encounters:  03/22/14 375 lb (170.099 kg)  10/12/13 390 lb (176.903 kg)  09/10/13 396 lb 2 oz (179.681 kg)  Constitutional: obese, in NAD Eyes: PERRLA, EOMI, no exophthalmos ENT: moist mucous membranes, no thyromegaly, no cervical lymphadenopathy Cardiovascular: RRR, No MRG Respiratory: CTA B Gastrointestinal: abdomen soft, NT, ND, BS+ Musculoskeletal: no deformities, strength intact in all 4 Skin: moist, warm, + rash on chin from shaving; +++ hirsutism on chin and chest; no acne Neurological: no tremor with outstretched hands, DTR normal in all 4  ASSESSMENT: 1. DM2, insulin-dependent, uncontrolled, without complications  2. PCOS Component     Latest Ref Rng 10/16/2013  Testosterone     10 - 70 ng/dL 54  Sex Hormone Binding     18 - 114 nmol/L 12 (L)  Testosterone Free     0.6 - 6.8 pg/mL 15.5 (H)  Testosterone-% Free     0.4 - 2.4 % 2.9 (H)  TSH     0.35 -  4.50 uIU/mL 0.54  17-OH-Progesterone, LC/MS/MS      <8  Free T4     0.60 - 1.60 ng/dL 1.610.84  T3, Free     2.3 - 4.2 pg/mL 3.0   Labs confirm PCOS (high testosterone, low 17-HO Prog, normal TFTs). We could not test LH and FSH since on Mirena.  PLAN:  1. DM2 Patient with long-standing, uncontrolled diabetes, on basal-bolus insulin regimen, with intermittent noncompliance - We discussed about options for treatment, and I suggested to:  Patient Instructions  Please continue: - Metformin 850 mg 2x a day  - Lantus 50 units in am  Increase: - Novolog insulin with meals as follows:  - smaller meal: 23 units  - regular meal: 28 units  - large meal: 33 units  - NovoLog SSI:  - 150- 165: + 2 unit  - 166- 180: + 3 units  - 181- 195: + 4 units  - 196- 210: + 5 units  - 211- 225: + 6 units  - 226- 240: + 7 units  - 241- 255: + 8 units  - >255: + 9 units   Please stop at the lab.  Please come back for a follow-up appointment in 3 months with your sugar log.  - continue checking sugars at different times of the day - check 3 times a day, rotating checks - advised for yearly eye exams, she is up to date - Return to clinic in 1 mo with sugar log   2. PCOS - pt with clinically evident PCOS, confirmed biochemically at last visit - she will have the IUD removed soon >> I suggested an OCP with estrogen to help with her hirsutism (Orthocyclen, for e.g.) - will check not recheck testosterone today, only after at least 3 mo on the new contraceptive - will continue Spironolactone 50 mg bid - no change in hirsutism noted yet (started 09/2013) - will check potassium today - again advised to strict contraception while on Spironolactone - continue Metformin 850 mg 2x a day  Office Visit on 03/22/2014  Component Date Value Ref Range Status  . Hemoglobin A1C 03/22/2014 8.6* 4.6 - 6.5 % Final   Glycemic Control Guidelines for People with Diabetes:Non Diabetic:  <6%Goal of Therapy: <7%Additional  Action Suggested:  >8%   . Sodium 03/22/2014 135  135 - 145 mEq/L Final  . Potassium 03/22/2014 4.1  3.5 - 5.1 mEq/L Final  . Chloride 03/22/2014 101  96 - 112 mEq/L Final  . CO2 03/22/2014 24  19 - 32 mEq/L Final  . Glucose, Bld 03/22/2014 58* 70 - 99 mg/dL Final  . BUN 09/60/454010/26/2015 19  6 -  23 mg/dL Final  . Creatinine, Ser 03/22/2014 1.2  0.4 - 1.2 mg/dL Final  . Calcium 69/62/952810/26/2015 9.3  8.4 - 10.5 mg/dL Final  . GFR 41/32/440110/26/2015 66.44  >60.00 mL/min Final   HbA1c improved. Glu low >> will advise to let me know if she continues to have lows. Cr higher >> stay hydrated >> will need repeat kidney fxn at next visit. Potassium normal.

## 2014-03-22 NOTE — Patient Instructions (Addendum)
Please continue: - Metformin 850 mg 2x a day  - Lantus 50 units in am  Increase: - Novolog insulin with meals as follows:  - smaller meal: 23 units  - regular meal: 28 units  - large meal: 33 units  - NovoLog SSI:  - 150- 165: + 2 unit  - 166- 180: + 3 units  - 181- 195: + 4 units  - 196- 210: + 5 units  - 211- 225: + 6 units  - 226- 240: + 7 units  - 241- 255: + 8 units  - >255: + 9 units   Please stop at the lab.  Please come back for a follow-up appointment in 3 months with your sugar log.

## 2014-03-23 LAB — BASIC METABOLIC PANEL
BUN: 19 mg/dL (ref 6–23)
CO2: 24 meq/L (ref 19–32)
CREATININE: 1.2 mg/dL (ref 0.4–1.2)
Calcium: 9.3 mg/dL (ref 8.4–10.5)
Chloride: 101 mEq/L (ref 96–112)
GFR: 66.44 mL/min (ref >60.00)
Glucose, Bld: 58 mg/dL — ABNORMAL LOW (ref 70–99)
Potassium: 4.1 mEq/L (ref 3.5–5.1)
Sodium: 135 mEq/L (ref 135–145)

## 2014-03-23 LAB — HEMOGLOBIN A1C: HEMOGLOBIN A1C: 8.6 % — AB (ref 4.6–6.5)

## 2014-04-03 ENCOUNTER — Telehealth: Payer: 59 | Admitting: Family

## 2014-04-03 DIAGNOSIS — J069 Acute upper respiratory infection, unspecified: Secondary | ICD-10-CM

## 2014-04-03 MED ORDER — BENZONATATE 200 MG PO CAPS: 200.0000 mg | ORAL_CAPSULE | Freq: Three times a day (TID) | ORAL | Status: DC | PRN

## 2014-04-03 NOTE — Progress Notes (Signed)
We are sorry that you are not feeling well.  Here is how we plan to help!  Based on what you have shared with me it looks like you have upper respiratory tract inflammation that has resulted in a signification cough.  Inflammation and infection in the upper respiratory tract is commonly called bronchitis and has four common causes:  Allergies, Viral Infections, Acid Reflux and Bacterial Infections.  Allergies, viruses and acid reflux are treated by controlling symptoms or eliminating the cause. An example might be a cough caused by taking certain blood pressure medications. You stop the cough by changing the medication. Another example might be a cough caused by acid reflux. Controlling the reflux helps control the cough.  Based on your presentation I believe you most likely have A cough due to allergies.  I recommend that you start the an over-the counter-allergy medication such as Claritin 10 mg or Zyrtec 10 mg daily.    In addition you may use A non-prescription cough medication called Mucinex DM: take 2 tablets every 12 hours. and A prescription cough medication called Tessalon Perles 100mg . You may take 1-2 capsules every 8 hours as needed for your cough.    HOME CARE . Only take medications as instructed by your medical team. . Complete the entire course of an antibiotic. . Drink plenty of fluids and get plenty of rest. . Avoid close contacts especially the very young and the elderly . Cover your mouth if you cough or cough into your sleeve. . Always remember to wash your hands . A steam or ultrasonic humidifier can help congestion.    GET HELP RIGHT AWAY IF: . You develop worsening fever. . You become short of breath . You cough up blood. . Your symptoms persist after you have completed your treatment plan MAKE SURE YOU   Understand these instructions.  Will watch your condition.  Will get help right away if you are not doing well or get worse.  Your e-visit answers were  reviewed by a board certified advanced clinical practitioner to complete your personal care plan.  Depending on the condition, your plan could have included both over the counter or prescription medications.  Please review your pharmacy choice.  If there is a problem, you may call our nursing hot line at [phone] and have the prescription routed to another pharmacy.  Your safety is important to us.  If you have drug allergies check your prescription carefully.    You can use MyChart to ask questions about today's visit, request a non-urgent call back, or ask for a work or school excuse.  You will get an e-mail in the next two days asking about your experience.  I hope that your e-visit has been valuable and will speed your recovery. Thank you for using e-visits.

## 2014-04-12 ENCOUNTER — Encounter: Payer: Self-pay | Admitting: Internal Medicine

## 2014-04-13 ENCOUNTER — Other Ambulatory Visit: Payer: Self-pay | Admitting: *Deleted

## 2014-04-13 MED ORDER — INSULIN PEN NEEDLE 32G X 4 MM MISC: Status: DC

## 2014-04-14 ENCOUNTER — Encounter: Payer: Self-pay | Admitting: Internal Medicine

## 2014-04-27 ENCOUNTER — Other Ambulatory Visit: Payer: Self-pay | Admitting: Internal Medicine

## 2014-04-27 LAB — HM DIABETES EYE EXAM

## 2014-05-23 ENCOUNTER — Other Ambulatory Visit: Payer: Self-pay | Admitting: Internal Medicine

## 2014-05-24 ENCOUNTER — Encounter: Payer: Self-pay | Admitting: *Deleted

## 2014-05-25 ENCOUNTER — Encounter: Payer: Self-pay | Admitting: Obstetrics & Gynecology

## 2014-05-27 ENCOUNTER — Encounter: Payer: Self-pay | Admitting: Internal Medicine

## 2014-05-27 ENCOUNTER — Other Ambulatory Visit: Payer: Self-pay | Admitting: Internal Medicine

## 2014-05-27 MED ORDER — METFORMIN HCL 850 MG PO TABS: 850.0000 mg | ORAL_TABLET | Freq: Two times a day (BID) | ORAL | Status: DC

## 2014-05-27 MED ORDER — LISINOPRIL 40 MG PO TABS: 40.0000 mg | ORAL_TABLET | Freq: Every day | ORAL | Status: DC

## 2014-06-03 ENCOUNTER — Other Ambulatory Visit: Payer: Self-pay | Admitting: Family

## 2014-06-11 ENCOUNTER — Encounter: Payer: Self-pay | Admitting: Family Medicine

## 2014-06-11 MED ORDER — FLUCONAZOLE 150 MG PO TABS: 150.0000 mg | ORAL_TABLET | Freq: Once | ORAL | Status: DC

## 2014-06-11 NOTE — Telephone Encounter (Signed)
Med filled.  

## 2014-06-22 ENCOUNTER — Encounter: Payer: Self-pay | Admitting: Internal Medicine

## 2014-06-22 ENCOUNTER — Encounter: Payer: Self-pay | Admitting: *Deleted

## 2014-06-22 ENCOUNTER — Ambulatory Visit (INDEPENDENT_AMBULATORY_CARE_PROVIDER_SITE_OTHER): Payer: 59 | Admitting: Internal Medicine

## 2014-06-22 ENCOUNTER — Telehealth: Payer: Self-pay | Admitting: *Deleted

## 2014-06-22 VITALS — BP 118/64 | HR 85 | Temp 99.0°F | Resp 12 | Wt 360.0 lb

## 2014-06-22 DIAGNOSIS — IMO0002 Reserved for concepts with insufficient information to code with codable children: Secondary | ICD-10-CM

## 2014-06-22 DIAGNOSIS — E282 Polycystic ovarian syndrome: Secondary | ICD-10-CM

## 2014-06-22 DIAGNOSIS — E1165 Type 2 diabetes mellitus with hyperglycemia: Secondary | ICD-10-CM

## 2014-06-22 LAB — BASIC METABOLIC PANEL
BUN: 26 mg/dL — AB (ref 6–23)
CO2: 24 meq/L (ref 19–32)
Calcium: 9.3 mg/dL (ref 8.4–10.5)
Chloride: 103 mEq/L (ref 96–112)
Creatinine, Ser: 1.27 mg/dL — ABNORMAL HIGH (ref 0.40–1.20)
GFR: 59.18 mL/min — ABNORMAL LOW (ref >60.00)
Glucose, Bld: 174 mg/dL — ABNORMAL HIGH (ref 70–99)
Potassium: 4.6 mEq/L (ref 3.5–5.1)
Sodium: 135 mEq/L (ref 135–145)

## 2014-06-22 LAB — HEMOGLOBIN A1C: HEMOGLOBIN A1C: 10.2 % — AB (ref 4.6–6.5)

## 2014-06-22 NOTE — Patient Instructions (Signed)
Please continue: - Metformin 850 mg 2x a day  - Lantus 50 units in am  - Novolog insulin with meals as follows:  - smaller meal: 23 units  - regular meal: 28 units >> use this for breakfast - large meal: 33 units  - NovoLog SSI:  - 150- 165: + 2 unit  - 166- 180: + 3 units  - 181- 195: + 4 units  - 196- 210: + 5 units  - 211- 225: + 6 units  - 226- 240: + 7 units  - 241- 255: + 8 units  - >255: + 9 units   Please stop at the lab.  Please come back for a follow-up appointment in 3 months with your sugar log.

## 2014-06-22 NOTE — Progress Notes (Signed)
Patient ID: Audrey Goodman, female   DOB: 05-09-1971, 44 y.o.   MRN: [id number]  HPI: Audrey CruelLena C Henes is a 44 y.o.-year-old female, returning for f/u for DM2, dx with GDM in 1995, then DM 6 mo after son was born, insulin-dependent since ~2010, uncontrolled, without complications and PCOS. Last visit 3 mo ago.   Last hemoglobin A1c was: Lab Results  Component Value Date   HGBA1C 8.6* 03/22/2014   HGBA1C 9.3* 09/10/2013   HGBA1C 10.0 02/18/2008   Pt is on a regimen of: - Metformin 850 mg 2x a day - Lantus 60 >> 50 units qam - Novolog insulin with meals as follows:  - smaller meal: 23 units  - regular meal: 28 units  - large meal: 33 units - NovoLog SSI:  - 150- 165: + 2 unit  - 166- 180: + 3 units  - 181- 195: + 4 units  - 196- 210: + 5 units  - 211- 225: + 6 units  - 226- 240: + 7 units  - 241- 255: + 8 units  - >255: + 9 units    Pt checks her sugars 1-3x a day and they are a little improved: - am: 120-200 >> 90-150 >> 100-130 (uses 23 units  - shake) - 2h after b'fast: n/c - before lunch: (90) 120-225 >> 150-160 >> 150-170 - 2h after lunch: n/c >> 113 >> n/c - before dinner: 120-220 >> 150s (rarely 200) >> 140-160 - 2h after dinner: n/c - bedtime: n/c >> 150s - nighttime: n/c No more lows at night. Lowest sugar was 80; she has hypoglycemia awareness at 80.  Highest sugar was 170.  Pt's meals are: - Breakfast: fast food, or yoghurt, or sometimes skips - Lunch: takeout or cooked: meat + veggies + starch - Dinner:  meat + veggies + starch - Snacks: apple sauce, fruit, PB and crackers Sodas: regular - most days  - No CKD, last BUN/creatinine:  Lab Results  Component Value Date   BUN 19 03/22/2014   CREATININE 1.2 03/22/2014  On Lisinopril. - last set of lipids: Lab Results  Component Value Date   CHOL 142 09/10/2013   HDL 45.70 09/10/2013   LDLCALC 75 09/10/2013   TRIG 107.0 09/10/2013   CHOLHDL 3 09/10/2013  On Zetia, Crestor 40.  - last eye exam was in  04/2014. No DR.  - no numbness and tingling in her feet.  She is thinking about GBP >> needs clearance from DM pov. She will have sleeve gastrectomy.  PCOS: - has had irregular menses since menarche - on Mirena IUD - for ~4.5 years >> [facility] - had 1 pregnancy, no miscarriages - + significant hirsutism - on Metformin 850 mg bid - On Spironolactone 50 mg bid  - tolerates it well. BP normal. Last K normal: Lab Results  Component Value Date   K 4.1 03/22/2014   Lost 15 lbs since last visit!  ROS: Constitutional: + weight loss, + fatigue, no subjective hyperthermia/hypothermia Eyes: no blurry vision, no xerophthalmia ENT: no sore throat, no nodules palpated in throat, no dysphagia/odynophagia, no hoarseness Cardiovascular: no CP/+ SOB/no palpitations/leg swelling Respiratory: + cough/+ SOB Gastrointestinal: no N/V/D/C Musculoskeletal: no muscle/joint aches Skin: no rashes, +++ excess hair on chin and chest; no acne Neurological: no tremors/numbness/tingling/dizziness  PE: BP 118/64 mmHg  Pulse 85  Temp(Src) 99 F (37.2 C) (Oral)  Resp 12  Wt 360 lb (163.295 kg)  SpO2 98% Wt Readings from Last 3  Encounters:  06/22/14 360 lb (163.295 kg)  03/22/14 375 lb (170.099 kg)  10/12/13 390 lb (176.903 kg)   Constitutional: obese, in NAD Eyes: PERRLA, EOMI, no exophthalmos ENT: moist mucous membranes, no thyromegaly, no cervical lymphadenopathy Cardiovascular: RRR, No MRG Respiratory: CTA B Gastrointestinal: abdomen soft, NT, ND, BS+ Musculoskeletal: no deformities, strength intact in all 4 Skin: moist, warm, + rash on chin from shaving; +++ hirsutism on chin and chest; no acne Neurological: no tremor with outstretched hands, DTR normal in all 4  ASSESSMENT: 1. DM2, insulin-dependent, uncontrolled, without complications  2. PCOS Component     Latest Ref Rng 10/16/2013  Testosterone     10 - 70 ng/dL 54  Sex Hormone Binding     18 - 114 nmol/L 12 (L)   Testosterone Free     0.6 - 6.8 pg/mL 15.5 (H)  Testosterone-% Free     0.4 - 2.4 % 2.9 (H)  TSH     0.35 - 4.50 uIU/mL 0.54  17-OH-Progesterone, LC/MS/MS      <8  Free T4     0.60 - 1.60 ng/dL 1.30  T3, Free     2.3 - 4.2 pg/mL 3.0   Labs confirm PCOS (high testosterone, low 17-HO Prog, normal TFTs). We could not test LH and FSH since on Mirena.  PLAN:  1. DM2 Patient with long-standing, uncontrolled diabetes, on basal-bolus insulin regimen, with still higher sugars before lunch and dinner >> need to increase the NovoLog with b'fast and lunch - We discussed about options for treatment, and I suggested to:  Patient Instructions  Please continue: - Metformin 850 mg 2x a day  - Lantus 50 units in am  - Novolog insulin with meals as follows:  - smaller meal: 23 units  - regular meal: 28 units >> use this for breakfast - large meal: 33 units  - NovoLog SSI:  - 150- 165: + 2 unit  - 166- 180: + 3 units  - 181- 195: + 4 units  - 196- 210: + 5 units  - 211- 225: + 6 units  - 226- 240: + 7 units  - 241- 255: + 8 units  - >255: + 9 units   Please stop at the lab.  Please come back for a follow-up appointment in 3 months with your sugar log.  - continue checking sugars at different times of the day - check 3 times a day, rotating checks - advised for yearly eye exams, she is up to date - Return to clinic in 1 mo with sugar log   2. PCOS - pt with clinically evident PCOS, confirmed biochemically at last visit - she will have the IUD removed soon >> I suggested an OCP with estrogen to help with her hirsutism (Orthocyclen, for e.g.) - will check not recheck testosterone today, only after at least 3 mo on the new contraceptive - will continue Spironolactone 50 mg bid - no change in hirsutism noted yet (started 09/2013) - again advised to strict contraception while on Spironolactone - continue Metformin 850 mg 2x a day  Office Visit on 06/22/2014  Component Date Value Ref  Range Status  . Hgb A1c MFr Bld 06/22/2014 10.2* 4.6 - 6.5 % Final   Glycemic Control Guidelines for People with Diabetes:Non Diabetic:  <6%Goal of Therapy: <7%Additional Action Suggested:  >8%   . Sodium 06/22/2014 135  135 - 145 mEq/L Final  . Potassium 06/22/2014 4.6  3.5 - 5.1 mEq/L Final  . Chloride 06/22/2014  103  96 - 112 mEq/L Final  . CO2 06/22/2014 24  19 - 32 mEq/L Final  . Glucose, Bld 06/22/2014 174* 70 - 99 mg/dL Final  . BUN 16/02/9603 26* 6 - 23 mg/dL Final  . Creatinine, Ser 06/22/2014 1.27* 0.40 - 1.20 mg/dL Final  . Calcium 54/01/8118 9.3  8.4 - 10.5 mg/dL Final  . GFR 14/78/2956 59.18* >60.00 mL/min Final   HbA1c higher than expected from log. Needs more insulin and I advised her to titrate up mealtime insulin. Kidney fxn ~same as before, which is high for her. I will route this to Dr Beverely Low.

## 2014-06-22 NOTE — Telephone Encounter (Signed)
Pre Visit Call:  Verified medications, allergies, and health history with patient and made changes as necessary.   Preferred pharmacy: [id number]- [place], [place] [phone]  Pap:   09/02/12    Negative Flu- 02/09/14 PNM 02/09/14 (unspecified)

## 2014-06-23 ENCOUNTER — Ambulatory Visit (INDEPENDENT_AMBULATORY_CARE_PROVIDER_SITE_OTHER): Payer: 59 | Admitting: Family Medicine

## 2014-06-23 ENCOUNTER — Encounter: Payer: Self-pay | Admitting: Family Medicine

## 2014-06-23 VITALS — BP 124/80 | HR 86 | Temp 98.4°F | Resp 16 | Ht 65.0 in | Wt 364.0 lb

## 2014-06-23 DIAGNOSIS — Z0000 Encounter for general adult medical examination without abnormal findings: Secondary | ICD-10-CM | POA: Insufficient documentation

## 2014-06-23 DIAGNOSIS — Z1231 Encounter for screening mammogram for malignant neoplasm of breast: Secondary | ICD-10-CM

## 2014-06-23 DIAGNOSIS — Z975 Presence of (intrauterine) contraceptive device: Secondary | ICD-10-CM

## 2014-06-23 NOTE — Progress Notes (Signed)
Pre visit review using our clinic review tool, if applicable. No additional management support is needed unless otherwise documented below in the visit note. 

## 2014-06-23 NOTE — Assessment & Plan Note (Signed)
PE WNL w/ exception of morbid obesity.  Due for mammo- order entered.  Needs GYN referral to remove IUD and have pap- order entered.  Check labs.  Anticipatory guidance provided.

## 2014-06-23 NOTE — Progress Notes (Signed)
   Subjective:    Patient ID: Audrey Goodman, female    DOB: November 15, 1970, 44 y.o.   MRN: [id number]  HPI CPE- does not have GYN but has IUD in place that needs to be removed.  Endo is recommending estrogen containing OCPs.  Due for mammo.   Review of Systems Patient reports no vision/ hearing changes, adenopathy,fever, weight change,  persistant/recurrent hoarseness , swallowing issues, chest pain, palpitations, edema, persistant/recurrent cough, hemoptysis, dyspnea (rest/exertional/paroxysmal nocturnal), gastrointestinal bleeding (melena, rectal bleeding), abdominal pain, significant heartburn, bowel changes, GU symptoms (dysuria, hematuria, incontinence), Gyn symptoms (abnormal  bleeding, pain),  syncope, focal weakness, memory loss, numbness & tingling, skin/hair/nail changes, abnormal bruising or bleeding, anxiety, or depression.     Objective:   Physical Exam General Appearance:    Alert, cooperative, no distress, appears stated age, morbidly obese  Head:    Normocephalic, without obvious abnormality, atraumatic  Eyes:    PERRL, conjunctiva/corneas clear, EOM's intact, fundi    benign, both eyes  Ears:    Normal TM's and external ear canals, both ears  Nose:   Nares normal, septum midline, mucosa normal, no drainage    or sinus tenderness  Throat:   Lips, mucosa, and tongue normal; teeth and gums normal  Neck:   Supple, symmetrical, trachea midline, no adenopathy;    Thyroid: no enlargement/tenderness/nodules  Back:     Symmetric, no curvature, ROM normal, no CVA tenderness  Lungs:     Clear to auscultation bilaterally, respirations unlabored  Chest Wall:    No tenderness or deformity   Heart:    Regular rate and rhythm, S1 and S2 normal, no murmur, rub   or gallop  Breast Exam:    Deferred to GYN  Abdomen:     Soft, non-tender, bowel sounds active all four quadrants,    no masses, no organomegaly  Genitalia:    Deferred to GYN  Rectal:    Extremities:   Extremities normal,  atraumatic, no cyanosis or edema  Pulses:   2+ and symmetric all extremities  Skin:   Skin color, texture, turgor normal, no rashes or lesions  Lymph nodes:   Cervical, supraclavicular, and axillary nodes normal  Neurologic:   CNII-XII intact, normal strength, sensation and reflexes    throughout          Assessment & Plan:   Problem List Items Addressed This Visit    Physical exam - Primary    PE WNL w/ exception of morbid obesity.  Due for mammo- order entered.  Needs GYN referral to remove IUD and have pap- order entered.  Check labs.  Anticipatory guidance provided.       Relevant Orders   Lipid panel   TSH   Hepatic function panel   CBC with Differential/Platelet   Vitamin D (25 hydroxy)    Other Visit Diagnoses    IUD (intrauterine device) in place        Relevant Orders    Ambulatory referral to Gynecology    Encounter for screening mammogram for breast cancer        Relevant Orders    MM DIGITAL SCREENING BILATERAL

## 2014-06-23 NOTE — Patient Instructions (Signed)
Follow up in 6 months to recheck BP and cholesterol We'll call you with your mammo and GYN appts We'll notify you of your lab results and make any changes if needed Continue the good work on weight loss efforts- you can do this! Call with any questions or concerns Happy New Year!!!

## 2014-06-24 ENCOUNTER — Encounter: Payer: Self-pay | Admitting: Family Medicine

## 2014-06-24 LAB — CBC WITH DIFFERENTIAL/PLATELET
Basophils Absolute: 0 10*3/uL (ref 0.0–0.1)
Basophils Relative: 0.3 % (ref 0.0–3.0)
EOS ABS: 0.2 10*3/uL (ref 0.0–0.7)
EOS PCT: 2.4 % (ref 0.0–5.0)
HCT: 37.7 % (ref 36.0–46.0)
Hemoglobin: 12.6 g/dL (ref 12.0–15.0)
Lymphocytes Relative: 37.4 % (ref 12.0–46.0)
Lymphs Abs: 3.6 10*3/uL (ref 0.7–4.0)
MCHC: 33.5 g/dL (ref 30.0–36.0)
MCV: 85.9 fl (ref 78.0–100.0)
MONOS PCT: 7.6 % (ref 3.0–12.0)
Monocytes Absolute: 0.7 10*3/uL (ref 0.1–1.0)
Neutro Abs: 5.1 10*3/uL (ref 1.4–7.7)
Neutrophils Relative %: 52.3 % (ref 43.0–77.0)
PLATELETS: 313 10*3/uL (ref 150.0–400.0)
RBC: 4.39 Mil/uL (ref 3.87–5.11)
RDW: 13.7 % (ref 11.5–15.5)
WBC: 9.7 10*3/uL (ref 4.0–10.5)

## 2014-06-24 LAB — LIPID PANEL
Cholesterol: 128 mg/dL (ref 0–200)
HDL: 46.7 mg/dL (ref >39.00)
LDL Cholesterol: 60 mg/dL (ref 0–99)
NONHDL: 81.3
TRIGLYCERIDES: 107 mg/dL (ref 0.0–149.0)
Total CHOL/HDL Ratio: 3
VLDL: 21.4 mg/dL (ref 0.0–40.0)

## 2014-06-24 LAB — HEPATIC FUNCTION PANEL
ALT: 19 U/L (ref 0–35)
AST: 20 U/L (ref 0–37)
Albumin: 4 g/dL (ref 3.5–5.2)
Alkaline Phosphatase: 73 U/L (ref 39–117)
Bilirubin, Direct: 0.1 mg/dL (ref 0.0–0.3)
TOTAL PROTEIN: 7.8 g/dL (ref 6.0–8.3)
Total Bilirubin: 0.4 mg/dL (ref 0.2–1.2)

## 2014-06-24 LAB — VITAMIN D 25 HYDROXY (VIT D DEFICIENCY, FRACTURES): VITD: 22.54 ng/mL — ABNORMAL LOW (ref 30.00–100.00)

## 2014-06-24 LAB — TSH: TSH: 2.65 u[IU]/mL (ref 0.35–4.50)

## 2014-06-25 ENCOUNTER — Other Ambulatory Visit: Payer: Self-pay | Admitting: General Practice

## 2014-06-25 MED ORDER — VITAMIN D (ERGOCALCIFEROL) 1.25 MG (50000 UNIT) PO CAPS: 50000.0000 [IU] | ORAL_CAPSULE | ORAL | Status: DC

## 2014-07-07 ENCOUNTER — Other Ambulatory Visit: Payer: Self-pay | Admitting: Internal Medicine

## 2014-07-07 MED ORDER — LISINOPRIL 40 MG PO TABS: 40.0000 mg | ORAL_TABLET | Freq: Every day | ORAL | Status: DC

## 2014-07-27 ENCOUNTER — Other Ambulatory Visit: Payer: Self-pay | Admitting: Obstetrics & Gynecology

## 2014-07-27 LAB — HM PAP SMEAR: HM Pap smear: NORMAL

## 2014-07-28 ENCOUNTER — Ambulatory Visit
Admission: RE | Admit: 2014-07-28 | Discharge: 2014-07-28 | Disposition: A | Discharge status: Home / Self Care | Payer: 59 | Source: Ambulatory Visit | Attending: Family Medicine | Admitting: Family Medicine

## 2014-07-28 DIAGNOSIS — Z1231 Encounter for screening mammogram for malignant neoplasm of breast: Secondary | ICD-10-CM

## 2014-07-28 LAB — CYTOLOGY - PAP

## 2014-08-05 ENCOUNTER — Other Ambulatory Visit: Payer: Self-pay | Admitting: General Practice

## 2014-08-05 MED ORDER — ROSUVASTATIN CALCIUM 40 MG PO TABS: 40.0000 mg | ORAL_TABLET | Freq: Every day | ORAL | Status: DC

## 2014-08-06 ENCOUNTER — Telehealth: Payer: Self-pay | Admitting: Family Medicine

## 2014-08-06 MED ORDER — FLUCONAZOLE 150 MG PO TABS: 150.0000 mg | ORAL_TABLET | Freq: Once | ORAL | Status: DC

## 2014-08-06 NOTE — Telephone Encounter (Signed)
[facility] [address] [place]

## 2014-08-06 NOTE — Telephone Encounter (Signed)
Med filled.  

## 2014-08-12 ENCOUNTER — Telehealth: Payer: Self-pay | Admitting: Family Medicine

## 2014-08-12 NOTE — Telephone Encounter (Signed)
Rec'd from [facility] forward 5 pages to Dr. Beverely Lowabori

## 2014-08-18 ENCOUNTER — Encounter: Payer: Self-pay | Admitting: General Practice

## 2014-08-27 NOTE — Patient Instructions (Addendum)
   Your procedure is scheduled on: Monday, April 18  [facility] at: 6 AM Pick up the phone at the desk and dial [phone] and inform us of your arrival.  Please call this number if you have any problems the morning of surgery: [phone]  Remember: Do not eat or drink after midnight: Sunday Take these medicines the morning of surgery with a SIP OF WATER:  zetia and spironolactone.  Do not take metformin on Sunday night's dose or Monday morning's dose. Do not take any diabetes medication on day of surgery.  We will check your sugar upon arrival to Short Stay Dept.  Do not wear jewelry, make-up, or FINGER nail polish No metal in your hair or on your body. Do not wear lotions, powders, perfumes.  You may wear deodorant.  Do not bring valuables to the hospital. Contacts, dentures or bridgework may not be worn into surgery.  Patients discharged on the day of surgery will not be allowed to drive home.  Home with firend Lynden Angathy

## 2014-08-30 ENCOUNTER — Encounter (HOSPITAL_COMMUNITY): Payer: Self-pay

## 2014-08-30 ENCOUNTER — Other Ambulatory Visit: Payer: Self-pay

## 2014-08-30 ENCOUNTER — Encounter (HOSPITAL_COMMUNITY)
Admission: RE | Admit: 2014-08-30 | Discharge: 2014-08-30 | Disposition: A | Discharge status: Home / Self Care | Payer: 59 | Source: Ambulatory Visit | Attending: Obstetrics & Gynecology | Admitting: Obstetrics & Gynecology

## 2014-08-30 DIAGNOSIS — Z01810 Encounter for preprocedural cardiovascular examination: Secondary | ICD-10-CM | POA: Diagnosis not present

## 2014-08-30 DIAGNOSIS — Z01812 Encounter for preprocedural laboratory examination: Secondary | ICD-10-CM | POA: Diagnosis present

## 2014-08-30 HISTORY — DX: Sleep apnea, unspecified: G47.30

## 2014-08-30 LAB — CBC
HCT: 34.8 % — ABNORMAL LOW (ref 36.0–46.0)
HEMOGLOBIN: 11.4 g/dL — AB (ref 12.0–15.0)
MCH: 28.8 pg (ref 26.0–34.0)
MCHC: 32.8 g/dL (ref 30.0–36.0)
MCV: 87.9 fL (ref 78.0–100.0)
PLATELETS: 325 10*3/uL (ref 150–400)
RBC: 3.96 MIL/uL (ref 3.87–5.11)
RDW: 12.7 % (ref 11.5–15.5)
WBC: 7.3 10*3/uL (ref 4.0–10.5)

## 2014-08-30 LAB — BASIC METABOLIC PANEL
Anion gap: 6 (ref 5–15)
BUN: 17 mg/dL (ref 6–23)
CO2: 28 mmol/L (ref 19–32)
Calcium: 8.8 mg/dL (ref 8.4–10.5)
Chloride: 104 mmol/L (ref 96–112)
Creatinine, Ser: 0.86 mg/dL (ref 0.50–1.10)
GFR calc Af Amer: >90 mL/min (ref >90)
GFR, EST NON AFRICAN AMERICAN: 82 mL/min — AB (ref >90)
GLUCOSE: 129 mg/dL — AB (ref 70–99)
Potassium: 4.1 mmol/L (ref 3.5–5.1)
Sodium: 138 mmol/L (ref 135–145)

## 2014-09-07 ENCOUNTER — Other Ambulatory Visit: Payer: Self-pay | Admitting: Family Medicine

## 2014-09-07 NOTE — Telephone Encounter (Signed)
Med denied, pt only needed to take 12 weeks

## 2014-09-10 NOTE — H&P (Signed)
Audrey Goodman is an 44 y.o. female with retained IUD secondary to strings not visible on exam.  The IUD was placed in 2011 and at attempted removal, typical measures did not bring the strings into view.  Ultrasound shows IUD in endometrial cavity.  Patient did not tolerated blind attempts at grasping for strings in office.    Pertinent Gynecological History: Menses: flow is light Bleeding: dysfunctional uterine bleeding Contraception: IUD DES exposure: unknown Blood transfusions: none Sexually transmitted diseases: Trichomonas Previous GYN Procedures: none  Last mammogram: normal Date: 07/2014 Last pap: normal Date: 07/2014 OB History: G1, P1   Menstrual History: Menarche age: n/a  No LMP recorded. Patient is not currently having periods (Reason: IUD).    Past Medical History  Diagnosis Date  . History of chicken pox   . Depression   . Diverticulitis   . Hypertension   . Hyperlipidemia   . Shingles   . Type 2 diabetes mellitus, uncontrolled 07/30/2006    Qualifier: Diagnosis of  By: Lanier PrudeBolden  MD, Cathrine Musteraineisha    . SVD (spontaneous vaginal delivery) 1995    x 1  . Sleep apnea     uses CPAP nightly  . Diabetes mellitus without complication     type 2  . Arthritis     left shoulder    Past Surgical History  Procedure Laterality Date  . Cholecystectomy  2008  . Ankle fracture surgery  1991    right    Family History  Problem Relation Age of Onset  . Cancer Mother     breast  . Hyperlipidemia Mother   . Heart disease Mother   . Hypertension Mother   . Mental illness Mother     depression  . Diabetes Mother   . Hyperlipidemia Father   . Stroke Father   . Hypertension Father   . Diabetes Maternal Uncle   . Cancer Maternal Grandmother     colon  . Diabetes Maternal Grandmother     Social History:  reports that she has never smoked. She has never used smokeless tobacco. She reports that she drinks alcohol. She reports that she does not use illicit drugs.  Allergies:  No Known Allergies  No prescriptions prior to admission    ROS  There were no vitals taken for this visit. Physical Exam  Constitutional: She is oriented to person, place, and time. She appears well-developed and well-nourished.  GI: Soft. There is no rebound and no guarding.  Neurological: She is alert and oriented to person, place, and time.  Skin: Skin is warm and dry.  Psychiatric: She has a normal mood and affect. Her behavior is normal.    No results found for this or any previous visit (from the past 24 hour(s)).  No results found.  Assessment/Plan: 43yo with retained IUD -Hysteroscopy to remove IUD -Patient is counseled re: risk of bleeding, infection, scarring and damage to surrounding structures.  All questions were answered and the patient wishes to proceed.  Conway Fedora 09/10/2014, 12:00 PM

## 2014-09-12 NOTE — Anesthesia Preprocedure Evaluation (Addendum)
Anesthesia Evaluation  Patient identified by MRN, date of birth, ID band Patient awake    Reviewed: Allergy & Precautions, NPO status , Patient's Chart, lab work & pertinent test results  History of Anesthesia Complications Negative for: history of anesthetic complications  Airway Mallampati: III  TM Distance: >3 FB Neck ROM: Full    Dental no notable dental hx. (+) Dental Advisory Given   Pulmonary sleep apnea ,  breath sounds clear to auscultation  Pulmonary exam normal       Cardiovascular hypertension, Pt. on medications Rhythm:Regular Rate:Normal     Neuro/Psych PSYCHIATRIC DISORDERS Anxiety Depression negative neurological ROS     GI/Hepatic negative GI ROS, Neg liver ROS,   Endo/Other  diabetes, Insulin DependentMorbid obesity  Renal/GU negative Renal ROS  negative genitourinary   Musculoskeletal  (+) Arthritis -,   Abdominal (+) + obese,   Peds negative pediatric ROS (+)  Hematology negative hematology ROS (+)   Anesthesia Other Findings   Reproductive/Obstetrics negative OB ROS                            Anesthesia Physical Anesthesia Plan  ASA: III  Anesthesia Plan: General   Post-op Pain Management:    Induction: Intravenous  Airway Management Planned: Oral ETT  Additional Equipment:   Intra-op Plan:   Post-operative Plan: Extubation in OR  Informed Consent: I have reviewed the patients History and Physical, chart, labs and discussed the procedure including the risks, benefits and alternatives for the proposed anesthesia with the patient or authorized representative who has indicated his/her understanding and acceptance.   Dental advisory given  Plan Discussed with: CRNA  Anesthesia Plan Comments:        Anesthesia Quick Evaluation

## 2014-09-13 ENCOUNTER — Ambulatory Visit (HOSPITAL_COMMUNITY)
Admission: RE | Admit: 2014-09-13 | Discharge: 2014-09-13 | Disposition: A | Discharge status: Home / Self Care | Payer: 59 | Source: Ambulatory Visit | Attending: Obstetrics & Gynecology | Admitting: Obstetrics & Gynecology

## 2014-09-13 ENCOUNTER — Ambulatory Visit (HOSPITAL_COMMUNITY): Payer: 59 | Admitting: Anesthesiology

## 2014-09-13 ENCOUNTER — Encounter (HOSPITAL_COMMUNITY)
Admission: RE | Disposition: A | Discharge status: Home / Self Care | Payer: Self-pay | Source: Ambulatory Visit | Attending: Obstetrics & Gynecology

## 2014-09-13 ENCOUNTER — Encounter (HOSPITAL_COMMUNITY): Payer: Self-pay | Admitting: Certified Registered Nurse Anesthetist

## 2014-09-13 DIAGNOSIS — I10 Essential (primary) hypertension: Secondary | ICD-10-CM | POA: Diagnosis not present

## 2014-09-13 DIAGNOSIS — E1165 Type 2 diabetes mellitus with hyperglycemia: Secondary | ICD-10-CM | POA: Insufficient documentation

## 2014-09-13 DIAGNOSIS — G4730 Sleep apnea, unspecified: Secondary | ICD-10-CM | POA: Diagnosis not present

## 2014-09-13 DIAGNOSIS — Z79899 Other long term (current) drug therapy: Secondary | ICD-10-CM | POA: Diagnosis not present

## 2014-09-13 DIAGNOSIS — Z30432 Encounter for removal of intrauterine contraceptive device: Secondary | ICD-10-CM | POA: Insufficient documentation

## 2014-09-13 DIAGNOSIS — M13812 Other specified arthritis, left shoulder: Secondary | ICD-10-CM | POA: Insufficient documentation

## 2014-09-13 DIAGNOSIS — Z9989 Dependence on other enabling machines and devices: Secondary | ICD-10-CM | POA: Diagnosis not present

## 2014-09-13 DIAGNOSIS — N858 Other specified noninflammatory disorders of uterus: Secondary | ICD-10-CM | POA: Diagnosis not present

## 2014-09-13 HISTORY — PX: HYSTEROSCOPY: SHX211

## 2014-09-13 LAB — GLUCOSE, CAPILLARY
Glucose-Capillary: 98 mg/dL (ref 70–99)
Glucose-Capillary: 108 mg/dL — ABNORMAL HIGH (ref 70–99)

## 2014-09-13 LAB — PREGNANCY, URINE: Preg Test, Ur: NEGATIVE

## 2014-09-13 SURGERY — HYSTEROSCOPY
Anesthesia: General

## 2014-09-13 MED ORDER — ONDANSETRON HCL 4 MG/2ML IJ SOLN
INTRAMUSCULAR | Status: DC | PRN
  Administered 2014-09-13: 4 mg via INTRAVENOUS

## 2014-09-13 MED ORDER — LACTATED RINGERS IR SOLN
Status: DC | PRN
Start: 2014-09-13 — End: 2014-09-13
  Administered 2014-09-13: 1

## 2014-09-13 MED ORDER — ONDANSETRON HCL 4 MG/2ML IJ SOLN
INTRAMUSCULAR | Status: AC
  Filled 2014-09-13: qty 2

## 2014-09-13 MED ORDER — BUPIVACAINE HCL (PF) 0.5 % IJ SOLN
INTRAMUSCULAR | Status: AC
  Filled 2014-09-13: qty 30

## 2014-09-13 MED ORDER — MIDAZOLAM HCL 2 MG/2ML IJ SOLN
INTRAMUSCULAR | Status: DC | PRN
  Administered 2014-09-13 (×2): 1 mg via INTRAVENOUS

## 2014-09-13 MED ORDER — PROPOFOL 10 MG/ML IV BOLUS
INTRAVENOUS | Status: AC
  Filled 2014-09-13: qty 20

## 2014-09-13 MED ORDER — FENTANYL CITRATE (PF) 100 MCG/2ML IJ SOLN
INTRAMUSCULAR | Status: DC | PRN
  Administered 2014-09-13: 100 ug via INTRAVENOUS

## 2014-09-13 MED ORDER — DEXAMETHASONE SODIUM PHOSPHATE 10 MG/ML IJ SOLN
INTRAMUSCULAR | Status: DC | PRN
  Administered 2014-09-13: 4 mg via INTRAVENOUS

## 2014-09-13 MED ORDER — DEXAMETHASONE SODIUM PHOSPHATE 4 MG/ML IJ SOLN
INTRAMUSCULAR | Status: AC
  Filled 2014-09-13: qty 1

## 2014-09-13 MED ORDER — ONDANSETRON HCL 4 MG/2ML IJ SOLN: 4.0000 mg | Freq: Once | INTRAMUSCULAR | Status: DC | PRN

## 2014-09-13 MED ORDER — SUCCINYLCHOLINE CHLORIDE 20 MG/ML IJ SOLN
INTRAMUSCULAR | Status: AC
  Filled 2014-09-13: qty 1

## 2014-09-13 MED ORDER — FENTANYL CITRATE (PF) 100 MCG/2ML IJ SOLN
25.0000 ug | INTRAMUSCULAR | Status: DC | PRN
  Administered 2014-09-13: 25 ug via INTRAVENOUS

## 2014-09-13 MED ORDER — SUCCINYLCHOLINE CHLORIDE 20 MG/ML IJ SOLN
INTRAMUSCULAR | Status: DC | PRN
  Administered 2014-09-13: 160 mg via INTRAVENOUS

## 2014-09-13 MED ORDER — LIDOCAINE HCL (CARDIAC) 20 MG/ML IV SOLN
INTRAVENOUS | Status: AC
  Filled 2014-09-13: qty 5

## 2014-09-13 MED ORDER — MIDAZOLAM HCL 2 MG/2ML IJ SOLN
INTRAMUSCULAR | Status: AC
  Filled 2014-09-13: qty 2

## 2014-09-13 MED ORDER — DEXTROSE IN LACTATED RINGERS 5 % IV SOLN: INTRAVENOUS | Status: DC

## 2014-09-13 MED ORDER — IBUPROFEN 800 MG PO TABS: 800.0000 mg | ORAL_TABLET | Freq: Four times a day (QID) | ORAL | Status: DC | PRN

## 2014-09-13 MED ORDER — LACTATED RINGERS IV SOLN
INTRAVENOUS | Status: DC
  Administered 2014-09-13: 06:00:00 via INTRAVENOUS

## 2014-09-13 MED ORDER — KETOROLAC TROMETHAMINE 30 MG/ML IJ SOLN
INTRAMUSCULAR | Status: DC | PRN
  Administered 2014-09-13: 30 mg via INTRAVENOUS

## 2014-09-13 MED ORDER — LIDOCAINE HCL (CARDIAC) 20 MG/ML IV SOLN
INTRAVENOUS | Status: DC | PRN
  Administered 2014-09-13: 80 mg via INTRAVENOUS

## 2014-09-13 MED ORDER — FENTANYL CITRATE (PF) 100 MCG/2ML IJ SOLN
INTRAMUSCULAR | Status: AC
  Filled 2014-09-13: qty 2

## 2014-09-13 MED ORDER — LIDOCAINE HCL 1 % IJ SOLN
INTRAMUSCULAR | Status: AC
  Filled 2014-09-13: qty 20

## 2014-09-13 MED ORDER — PROPOFOL 10 MG/ML IV BOLUS
INTRAVENOUS | Status: DC | PRN
  Administered 2014-09-13: 200 mg via INTRAVENOUS
  Administered 2014-09-13: 50 mg via INTRAVENOUS

## 2014-09-13 MED ORDER — FENTANYL CITRATE (PF) 100 MCG/2ML IJ SOLN
INTRAMUSCULAR | Status: AC
  Administered 2014-09-13: 25 ug via INTRAVENOUS
  Filled 2014-09-13: qty 2

## 2014-09-13 MED ORDER — OXYCODONE-ACETAMINOPHEN 5-325 MG PO TABS: 1.0000 | ORAL_TABLET | ORAL | Status: DC | PRN

## 2014-09-13 MED ORDER — KETOROLAC TROMETHAMINE 30 MG/ML IJ SOLN
INTRAMUSCULAR | Status: AC
Start: 2014-09-13 — End: 2014-09-13
  Filled 2014-09-13: qty 1

## 2014-09-13 MED ORDER — SCOPOLAMINE 1 MG/3DAYS TD PT72: 1.0000 | MEDICATED_PATCH | Freq: Once | TRANSDERMAL | Status: DC

## 2014-09-13 SURGICAL SUPPLY — 19 items
ABLATOR ENDOMETRIAL BIPOLAR (ABLATOR) IMPLANT
CANISTER SUCT 3000ML (MISCELLANEOUS) ×2 IMPLANT
CATH ROBINSON RED A/P 16FR (CATHETERS) ×2 IMPLANT
CATH THERMACHOICE III (CATHETERS) IMPLANT
CLOTH BEACON ORANGE TIMEOUT ST (SAFETY) ×2 IMPLANT
CONTAINER PREFILL 10% NBF 60ML (FORM) ×4 IMPLANT
ELECT REM PT RETURN 9FT ADLT (ELECTROSURGICAL) ×2
ELECTRODE REM PT RTRN 9FT ADLT (ELECTROSURGICAL) ×1 IMPLANT
GLOVE BIO SURGEON STRL SZ 6 (GLOVE) ×2 IMPLANT
GLOVE BIOGEL PI IND STRL 6 (GLOVE) ×2 IMPLANT
GLOVE BIOGEL PI INDICATOR 6 (GLOVE) ×2
GOWN STRL REUS W/TWL LRG LVL3 (GOWN DISPOSABLE) ×4 IMPLANT
LOOP ANGLED CUTTING 22FR (CUTTING LOOP) IMPLANT
PACK VAGINAL MINOR WOMEN LF (CUSTOM PROCEDURE TRAY) ×2 IMPLANT
PAD OB MATERNITY 4.3X12.25 (PERSONAL CARE ITEMS) ×2 IMPLANT
TOWEL OR 17X24 6PK STRL BLUE (TOWEL DISPOSABLE) ×4 IMPLANT
TUBING AQUILEX INFLOW (TUBING) ×2 IMPLANT
TUBING AQUILEX OUTFLOW (TUBING) ×2 IMPLANT
WATER STERILE IRR 1000ML POUR (IV SOLUTION) ×2 IMPLANT

## 2014-09-13 NOTE — Transfer of Care (Signed)
Immediate Anesthesia Transfer of Care Note  Patient: Audrey Goodman  Procedure(s) Performed: Procedure(s) with comments: HYSTEROSCOPY with IUD removal (N/A) - need longest speculum and instruments that aresavailable due to patient's habitus (BMI ~60)  Patient Location: PACU  Anesthesia Type:General  Level of Consciousness: awake, alert , oriented and patient cooperative  Airway & Oxygen Therapy: Patient Spontanous Breathing  Post-op Assessment: Report given to RN and Post -op Vital signs reviewed and stable  Post vital signs: Reviewed and stable  Last Vitals:  Filed Vitals:   09/13/14 0611  BP: 144/70  Pulse: 79  Temp: 37.5 C  Resp: 20    Complications: No apparent anesthesia complications

## 2014-09-13 NOTE — Anesthesia Procedure Notes (Signed)
Procedure Name: Intubation Date/Time: 09/13/2014 7:27 AM Performed by: Yolonda KidaARVER, Neve Branscomb L Pre-anesthesia Checklist: Patient identified, Emergency Drugs available, Suction available and Patient being monitored Patient Re-evaluated:Patient Re-evaluated prior to inductionOxygen Delivery Method: Circle system utilized Preoxygenation: Pre-oxygenation with 100% oxygen Intubation Type: IV induction Ventilation: Mask ventilation without difficulty and Oral airway inserted - appropriate to patient size Laryngoscope Size: Miller and 3 Grade View: Grade III Tube type: Oral Tube size: 7.0 mm Number of attempts: 1 Airway Equipment and Method: Patient positioned with wedge pillow and Stylet Placement Confirmation: positive ETCO2,  CO2 detector and breath sounds checked- equal and bilateral Secured at: 23 cm Tube secured with: Tape Dental Injury: Teeth and Oropharynx as per pre-operative assessment  Difficulty Due To: Difficulty was anticipated Future Recommendations: Recommend- induction with short-acting agent, and alternative techniques readily available

## 2014-09-13 NOTE — Op Note (Signed)
PREOPERATIVE DIAGNOSIS:  44 y.o. with retained IUD  POSTOPERATIVE DIAGNOSIS: The same  PROCEDURE: Hysteroscopic removal of IUD  SURGEON:  Dr. Mitchel HonourMegan Tait Balistreri  INDICATIONS: 44 y.o. for scheduled surgery for hysteroscopic removal of retained IUD.  IUD removal was attempted in the office but because of patient's body habitus and IUD strings not visible, the decision was made to proceed to OR. Risks of surgery were discussed with the patient including but not limited to: bleeding which may require transfusion; infection which may require antibiotics; injury to uterus or surrounding organs; intrauterine scarring which may impair future fertility; need for additional procedures including laparotomy or laparoscopy; and other postoperative/anesthesia complications. Written informed consent was obtained.    FINDINGS:  A 6 week size uterus.  Atrophic appearing endometrium.  Normal ostia bilaterally.  IUD within cavity.  ANESTHESIA:   General  ESTIMATED BLOOD LOSS:  None  SPECIMENS: None  COMPLICATIONS:  None immediate.  PROCEDURE DETAILS:  The patient was taken to the operating room where general anesthesia was administered and was found to be adequate.  After an adequate timeout was performed, she was placed in the dorsal lithotomy position and examined; then prepped and draped in the sterile manner.   Her bladder was catheterized.  A speculum was then placed in the patient's vagina and a single tooth tenaculum was applied to the anterior lip of the cervix. The uterus was sounded to 8 cm and dilated manually with metal dilators to accommodate the 5.5 mm hysteroscope.  Once the cervix was dilated, the hysteroscope was inserted under direct visualization using saline as a suspension medium.  The uterine cavity was carefully examined with the above listed findings.   Using the hysteroscopic grasper, the IUD was removed with first attempt.  The tenaculum was removed from the anterior lip of the cervix and the  vaginal speculum was removed after noting good hemostasis.  The patient tolerated the procedure well and was taken to the recovery area awake, extubated and in stable condition.

## 2014-09-13 NOTE — Progress Notes (Signed)
No change to H&P. 

## 2014-09-13 NOTE — Anesthesia Postprocedure Evaluation (Signed)
  Anesthesia Post-op Note  Patient: Audrey Goodman  Procedure(s) Performed: Procedure(s) (LRB): HYSTEROSCOPY with IUD removal (N/A)  Patient Location: PACU  Anesthesia Type: General  Level of Consciousness: awake and alert   Airway and Oxygen Therapy: Patient Spontanous Breathing  Post-op Pain: mild  Post-op Assessment: Post-op Vital signs reviewed, Patient's Cardiovascular Status Stable, Respiratory Function Stable, Patent Airway and No signs of Nausea or vomiting  Last Vitals:  Filed Vitals:   09/13/14 0830  BP: 123/56  Pulse: 80  Temp:   Resp: 22    Post-op Vital Signs: stable   Complications: No apparent anesthesia complications

## 2014-09-13 NOTE — Discharge Instructions (Signed)
Call MD for T>100.4, heavy vaginal bleeding, severe abdominal pain, intractable nausea and/or vomiting.  Call office to schedule postop appointment in 2 weeks.  No driving while taking narcotics.  Pelvic rest x 4 weeks.   DISCHARGE INSTRUCTIONS: HYSTEROSCOPY The following instructions have been prepared to help you care for yourself upon your return home.   MAY TAKE IBUPROFEN (MOTRIN, ADVIL) OR ALEVE AFTER 1:45 PM FOR PAIN!!  Personal hygiene:  Use sanitary pads for vaginal drainage, not tampons.  Shower the day after your procedure.  NO tub baths, pools or Jacuzzis for 2-3 weeks.  Wipe front to back after using the bathroom.  Activity and limitations:  Do NOT drive or operate any equipment for 24 hours. The effects of anesthesia are still present and drowsiness may result.  Do NOT rest in bed all day.  Walking is encouraged.  Walk up and down stairs slowly.  You may resume your normal activity in one to two days or as indicated by your physician.  Sexual activity: NO intercourse for at least 2 weeks after the procedure, or as indicated by your physician.  Diet: Eat a light meal as desired this evening. You may resume your usual diet tomorrow.  Return to work: You may resume your work activities in one to two days or as indicated by your doctor.  What to expect after your surgery: Expect to have vaginal bleeding/discharge for 2-3 days and spotting for up to 10 days. It is not unusual to have soreness for up to 1-2 weeks. You may have a slight burning sensation when you urinate for the first day. Mild cramps may continue for a couple of days. You may have a regular period in 2-6 weeks.  Call your doctor for any of the following:  Excessive vaginal bleeding, saturating and changing one pad every hour.  Inability to urinate 6 hours after discharge from hospital.  Pain not relieved by pain medication.  Fever of 100.4 F or greater.  Unusual vaginal discharge or odor.   Call for an appointment:    Patients signature: ______________________  Nurses signature ________________________  Support person's signature_______________________

## 2014-09-14 ENCOUNTER — Encounter (HOSPITAL_COMMUNITY): Payer: Self-pay | Admitting: Obstetrics & Gynecology

## 2014-09-19 ENCOUNTER — Other Ambulatory Visit: Payer: Self-pay | Admitting: Internal Medicine

## 2014-09-20 ENCOUNTER — Other Ambulatory Visit: Payer: Self-pay | Admitting: *Deleted

## 2014-09-20 MED ORDER — SPIRONOLACTONE 50 MG PO TABS: ORAL_TABLET | ORAL | Status: DC

## 2014-09-21 ENCOUNTER — Other Ambulatory Visit: Payer: Self-pay | Admitting: Internal Medicine

## 2014-09-22 ENCOUNTER — Ambulatory Visit: Payer: 59 | Admitting: Internal Medicine

## 2014-09-29 ENCOUNTER — Ambulatory Visit: Payer: 59 | Admitting: Internal Medicine

## 2014-10-03 ENCOUNTER — Other Ambulatory Visit: Payer: Self-pay | Admitting: Internal Medicine

## 2014-10-04 ENCOUNTER — Other Ambulatory Visit: Payer: Self-pay | Admitting: Internal Medicine

## 2014-10-04 ENCOUNTER — Other Ambulatory Visit: Payer: Self-pay | Admitting: *Deleted

## 2014-10-04 MED ORDER — LISINOPRIL 40 MG PO TABS: 40.0000 mg | ORAL_TABLET | Freq: Every day | ORAL | Status: DC

## 2014-10-04 MED ORDER — METFORMIN HCL 850 MG PO TABS: 850.0000 mg | ORAL_TABLET | Freq: Two times a day (BID) | ORAL | Status: DC

## 2014-10-08 ENCOUNTER — Encounter: Payer: Self-pay | Admitting: *Deleted

## 2014-10-15 NOTE — Telephone Encounter (Signed)
Patient need a refill of Novalog Flex pen [facility] [address]

## 2014-10-19 ENCOUNTER — Other Ambulatory Visit: Payer: Self-pay | Admitting: *Deleted

## 2014-10-19 MED ORDER — INSULIN ASPART 100 UNIT/ML FLEXPEN: 35.0000 [IU] | PEN_INJECTOR | Freq: Three times a day (TID) | SUBCUTANEOUS | Status: DC

## 2014-11-02 ENCOUNTER — Ambulatory Visit: Payer: 59 | Admitting: Internal Medicine

## 2014-11-02 DIAGNOSIS — Z0289 Encounter for other administrative examinations: Secondary | ICD-10-CM

## 2014-11-22 ENCOUNTER — Other Ambulatory Visit: Payer: Self-pay

## 2014-12-06 ENCOUNTER — Other Ambulatory Visit: Payer: Self-pay | Admitting: Internal Medicine

## 2014-12-07 ENCOUNTER — Other Ambulatory Visit: Payer: Self-pay | Admitting: *Deleted

## 2014-12-07 MED ORDER — LISINOPRIL 40 MG PO TABS: 40.0000 mg | ORAL_TABLET | Freq: Every day | ORAL | Status: DC

## 2014-12-08 ENCOUNTER — Telehealth: Payer: Self-pay | Admitting: General Practice

## 2014-12-08 MED ORDER — ESCITALOPRAM OXALATE 5 MG PO TABS: 5.0000 mg | ORAL_TABLET | Freq: Every day | ORAL | Status: DC

## 2014-12-08 NOTE — Telephone Encounter (Signed)
Last OV was CPE in January 2016 Lexapro does not show being filled at our office previously,was D/C'd at CPE at [facility]  SIG states pt take 5mg  tablet, with not further directions. Please advise

## 2014-12-08 NOTE — Telephone Encounter (Signed)
Ok to restart  daily.  Will need OV in 1 month to recheck mood

## 2014-12-08 NOTE — Telephone Encounter (Signed)
Letter mailed to pt to schedule follow up in 1 month.

## 2014-12-10 ENCOUNTER — Telehealth: Payer: Self-pay

## 2014-12-10 NOTE — Telephone Encounter (Signed)
Left pt a VM for pt to make an appt with Dr.Gherghe

## 2014-12-22 ENCOUNTER — Ambulatory Visit: Payer: Self-pay | Admitting: Family Medicine

## 2014-12-22 ENCOUNTER — Telehealth: Payer: Self-pay | Admitting: Family Medicine

## 2014-12-29 NOTE — Telephone Encounter (Signed)
Pt was no show 12/22/14 8:30am, follow up appt, left msg for pt to call and reschedule, charge for no show?

## 2014-12-29 NOTE — Telephone Encounter (Signed)
Yes- pt needs a no-show fee 

## 2015-01-05 ENCOUNTER — Other Ambulatory Visit: Payer: Self-pay | Admitting: Internal Medicine

## 2015-02-03 ENCOUNTER — Telehealth: Payer: Self-pay | Admitting: Internal Medicine

## 2015-02-03 MED ORDER — INSULIN GLARGINE 100 UNIT/ML SOLOSTAR PEN: PEN_INJECTOR | SUBCUTANEOUS | Status: DC

## 2015-02-03 NOTE — Telephone Encounter (Signed)
Pt need refill on her lantus insulin, crestor 40 mg

## 2015-02-11 ENCOUNTER — Other Ambulatory Visit: Payer: Self-pay | Admitting: Internal Medicine

## 2015-03-01 ENCOUNTER — Ambulatory Visit: Payer: Self-pay | Admitting: Internal Medicine

## 2015-03-01 DIAGNOSIS — Z0289 Encounter for other administrative examinations: Secondary | ICD-10-CM

## 2015-03-03 ENCOUNTER — Other Ambulatory Visit: Payer: Self-pay | Admitting: Internal Medicine

## 2015-03-05 ENCOUNTER — Emergency Department (INDEPENDENT_AMBULATORY_CARE_PROVIDER_SITE_OTHER): Payer: 59

## 2015-03-05 ENCOUNTER — Encounter (HOSPITAL_COMMUNITY): Payer: Self-pay | Admitting: Emergency Medicine

## 2015-03-05 ENCOUNTER — Emergency Department (INDEPENDENT_AMBULATORY_CARE_PROVIDER_SITE_OTHER)
Admission: EM | Admit: 2015-03-05 | Discharge: 2015-03-05 | Disposition: A | Discharge status: Home / Self Care | Payer: 59 | Source: Home / Self Care | Attending: Family Medicine | Admitting: Family Medicine

## 2015-03-05 DIAGNOSIS — S93401A Sprain of unspecified ligament of right ankle, initial encounter: Secondary | ICD-10-CM

## 2015-03-05 NOTE — ED Notes (Signed)
Right ankle pain is patient's complaint.  Patient was wearing sneakers yesterday, walking and felt something pop at slightly medial of the back of the ankle.  Pain continues today and now involves pain in bottom of foot.  Patient reports she did not roll ankle

## 2015-03-05 NOTE — Discharge Instructions (Signed)
Wear ankle support as needed for comfort, activity as tolerated. advil and ice as needed, return or see orthopedist if further problems. °

## 2015-03-05 NOTE — ED Provider Notes (Signed)
CSN: [id number]     Arrival date & time 03/05/15  1353 History   First MD Initiated Contact with Patient 03/05/15 1418     Chief Complaint  Patient presents with  . Ankle Pain   (Consider location/radiation/quality/duration/timing/severity/associated sxs/prior Treatment) Patient is a 44 y.o. female presenting with ankle pain. The history is provided by the patient.  Ankle Pain Location:  Ankle Time since incident:  1 day Injury: no (walking in atletic shoes and felt medial ankle pop, today extends to medial arch, achilles intact, no lat pain or sts, toes intact.)   Ankle location:  R ankle Pain details:    Quality:  Sharp   Radiates to:  Does not radiate   Severity:  Mild   Onset quality:  Gradual   Progression:  Unchanged Chronicity:  New Dislocation: no     Past Medical History  Diagnosis Date  . History of chicken pox   . Depression   . Diverticulitis   . Hypertension   . Hyperlipidemia   . Shingles   . Type 2 diabetes mellitus, uncontrolled (HCC) 07/30/2006    Qualifier: Diagnosis of  By: Lanier Prude  MD, Cathrine Muster    . SVD (spontaneous vaginal delivery) 1995    x 1  . Sleep apnea     uses CPAP nightly  . Diabetes mellitus without complication (HCC)     type 2  . Arthritis     left shoulder   Past Surgical History  Procedure Laterality Date  . Cholecystectomy  2008  . Ankle fracture surgery  1991    right  . Hysteroscopy N/A 09/13/2014    Procedure: HYSTEROSCOPY with IUD removal;  Surgeon: Mitchel Honour, DO;  Location: [facility];  Service: Gynecology;  Laterality: N/A;  need longest speculum and instruments that aresavailable due to patient's habitus (BMI ~60)   Family History  Problem Relation Age of Onset  . Cancer Mother     breast  . Hyperlipidemia Mother   . Heart disease Mother   . Hypertension Mother   . Mental illness Mother     depression  . Diabetes Mother   . Hyperlipidemia Father   . Stroke Father   . Hypertension Father   . Diabetes Maternal Uncle    . Cancer Maternal Grandmother     colon  . Diabetes Maternal Grandmother    Social History  Substance Use Topics  . Smoking status: Never Smoker   . Smokeless tobacco: Never Used  . Alcohol Use: Yes     Comment: socially   OB History    No data available     Review of Systems  Musculoskeletal: Positive for gait problem. Negative for joint swelling.  Skin: Negative.     Allergies  Review of patient's allergies indicates no known allergies.  Home Medications   Prior to Admission medications   Medication Sig Start Date End Date Taking? Authorizing Provider  benzonatate (TESSALON) 200 MG capsule TAKE 1 CAPSULE (200 MG TOTAL) BY MOUTH 3 (THREE) TIMES DAILY AS NEEDED. Patient not taking: Reported on 06/23/2014 06/04/14   Junie Spencer, FNP  escitalopram (LEXAPRO) 5 MG tablet Take 1 tablet (5 mg total) by mouth daily. 12/08/14   Sheliah Hatch, MD  ezetimibe (ZETIA) 10 MG tablet Take 1 tablet (10 mg total) by mouth daily. Patient taking differently: Take 10 mg by mouth every morning.  10/13/13   Sheliah Hatch, MD  fluconazole (DIFLUCAN) 150 MG tablet Take 1 tablet (150 mg total) by mouth  once. Patient not taking: Reported on 08/30/2014 08/06/14   Sheliah Hatch, MD  furosemide (LASIX) 40 MG tablet Take 40 mg by mouth at bedtime as needed for fluid or edema.     Historical Provider, MD  hydrochlorothiazide (HYDRODIURIL) 25 MG tablet Take 1 tablet (25 mg total) by mouth daily. Patient not taking: Reported on 08/30/2014 10/13/13   Sheliah Hatch, MD  ibuprofen (ADVIL,MOTRIN) 800 MG tablet Take 1 tablet (800 mg total) by mouth every 6 (six) hours as needed. 09/13/14   Megan Morris, DO  insulin aspart (NOVOLOG FLEXPEN) 100 UNIT/ML FlexPen Inject 35 Units into the skin 3 (three) times daily with meals. Inject into the skin up to 35 units 3x a day as advised 10/19/14   Carlus Pavlov, MD  Insulin Pen Needle (CAREFINE PEN NEEDLES) 32G X 4 MM MISC Use to inject insulin 4 times daily  as instructed. 04/13/14   Carlus Pavlov, MD  LANTUS SOLOSTAR 100 UNIT/ML Solostar Pen INJECT 50 UNITS            SUBCUTANEOUSLY IN THE      MORNING AS ADVISED Patient not taking: Reported on 08/30/2014 07/07/14   Carlus Pavlov, MD  LANTUS SOLOSTAR 100 UNIT/ML Solostar Pen INJECT 50 UNITS INTO THE SKIN AT BEDTIME **PT NEEDS FOLLOW UP APPT** 03/03/15   Carlus Pavlov, MD  lisinopril (PRINIVIL,ZESTRIL) 40 MG tablet Take 1 tablet (40 mg total) by mouth daily. 12/07/14   Carlus Pavlov, MD  metFORMIN (GLUCOPHAGE) 850 MG tablet Take 1 tablet (850 mg total) by mouth 2 (two) times daily with a meal. **PT NEEDS FOLLOW UP APPT FOR FURTHER REFILLS** 02/11/15   Carlus Pavlov, MD  norgestimate-ethinyl estradiol (ORTHO-CYCLEN,SPRINTEC,PREVIFEM) 0.25-35 MG-MCG tablet Take 1 tablet by mouth daily.    Historical Provider, MD  ONE TOUCH ULTRA TEST test strip USE TO TEST BLOOD SUGAR 3 TIMES DAILY AS INSTRUCTED 09/21/14   Carlus Pavlov, MD  oxyCODONE-acetaminophen (ROXICET) 5-325 MG per tablet Take 1-2 tablets by mouth every 4 (four) hours as needed for severe pain. 09/13/14   Megan Morris, DO  phentermine 37.5 MG capsule Take 37.5 mg by mouth every morning.    Historical Provider, MD  rosuvastatin (CRESTOR) 40 MG tablet Take 1 tablet (40 mg total) by mouth daily. Patient taking differently: Take 40 mg by mouth at bedtime.  08/05/14   Sheliah Hatch, MD  spironolactone (ALDACTONE) 50 MG tablet TAKE 1 TABLET (50 MG TOTAL) BY MOUTH 2 (TWO) TIMES DAILY. 01/05/15   Carlus Pavlov, MD  Vitamin D, Ergocalciferol, (DRISDOL) 50000 UNITS CAPS capsule Take 1 capsule (50,000 Units total) by mouth every 7 (seven) days. 06/25/14   Sheliah Hatch, MD   Meds Ordered and Administered this Visit  Medications - No data to display  BP 117/79 mmHg  Pulse 80  Temp(Src) 98.8 F (37.1 C) (Oral)  Resp 16  SpO2 98% No data found.   Physical Exam  Constitutional: She is oriented to person, place, and time. She appears  well-developed and well-nourished.  Musculoskeletal: Normal range of motion. She exhibits tenderness.       Right ankle: She exhibits no swelling, no ecchymosis and normal pulse. Tenderness. Medial malleolus tenderness found. No head of 5th metatarsal and no proximal fibula tenderness found. Achilles tendon normal.  Neurological: She is alert and oriented to person, place, and time.  Skin: Skin is warm and dry.  Nursing note and vitals reviewed.   ED Course  Procedures (including critical care time)  Labs Review  Labs Reviewed - No data to display  Imaging Review Dg Ankle Complete Right  03/05/2015   CLINICAL DATA:  Pt states she felt a "pop" in right ankle 1 day ago; Hx of previous surgery to same ankle 18 yrs ago due to fracture. She complains of pain medial and posterior ankle  EXAM: RIGHT ANKLE - COMPLETE 3+ VIEW  COMPARISON:  None.  FINDINGS: Fixation hardware across the medial malleolus and in the distal right fibula. No fracture or dislocation. Ankle mortise intact. Normal mineralization and alignment. Regional soft tissues unremarkable.  IMPRESSION: Postop changes as above.  No acute fracture or dislocation.   Electronically Signed   By: Corlis Leak M.D.   On: 03/05/2015 14:53   X-rays reviewed and report per radiologist.  Visual Acuity Review  Right Eye Distance:   Left Eye Distance:   Bilateral Distance:    Right Eye Near:   Left Eye Near:    Bilateral Near:         MDM   1. Ankle sprain, right, initial encounter       Linna Hoff, MD 03/05/15 2021

## 2015-04-05 ENCOUNTER — Ambulatory Visit: Payer: Self-pay | Admitting: Family Medicine

## 2015-04-05 DIAGNOSIS — Z0289 Encounter for other administrative examinations: Secondary | ICD-10-CM

## 2015-04-07 ENCOUNTER — Telehealth: Payer: Self-pay | Admitting: Family Medicine

## 2015-04-07 NOTE — Telephone Encounter (Signed)
Pt was no show for follow up 04/05/15 8:45am, pt has not rescheduled, charge or no charge?

## 2015-04-07 NOTE — Telephone Encounter (Signed)
Yes- pt needs a no-show fee 

## 2015-04-18 ENCOUNTER — Other Ambulatory Visit: Payer: Self-pay | Admitting: *Deleted

## 2015-04-18 ENCOUNTER — Other Ambulatory Visit: Payer: Self-pay | Admitting: Internal Medicine

## 2015-04-18 MED ORDER — INSULIN ASPART 100 UNIT/ML FLEXPEN: 28.0000 [IU] | PEN_INJECTOR | Freq: Three times a day (TID) | SUBCUTANEOUS | Status: DC

## 2015-04-18 MED ORDER — INSULIN GLARGINE 100 UNIT/ML SOLOSTAR PEN: PEN_INJECTOR | SUBCUTANEOUS | Status: DC

## 2015-04-18 NOTE — Telephone Encounter (Signed)
Message via MyChart rx request from pt:  I am asking to have my Lantus and Novolog flex pens refilled enough to last me through Jan 1st. I am financially unable to come in for a visit until that time, which is when my flex spend for 2017 will kick in at work. Please contact me if you need to. [phone]. Thanks. Refill sent to pt's pharmacy.

## 2015-05-23 ENCOUNTER — Other Ambulatory Visit: Payer: Self-pay | Admitting: Internal Medicine

## 2015-06-20 ENCOUNTER — Telehealth: Payer: Self-pay | Admitting: Internal Medicine

## 2015-06-20 MED ORDER — INSULIN DETEMIR 100 UNIT/ML FLEXPEN: 50.0000 [IU] | PEN_INJECTOR | Freq: Every day | SUBCUTANEOUS | Status: DC

## 2015-06-20 NOTE — Telephone Encounter (Signed)
OK to refill Levemir for 2 mo until next appt (0 refills). She has multiple No Shows and cancellations. If she does not come at next visit, I will d/c her from the practice.

## 2015-06-20 NOTE — Telephone Encounter (Signed)
Patient called stating that her insurance will no longer cover her Rx  Rx: Lantus Please send Levemir   Pharmacy: [facility] [address]    Thank you

## 2015-06-20 NOTE — Telephone Encounter (Signed)
Please read message below and advise if ok to switch.

## 2015-06-29 LAB — HM DIABETES EYE EXAM

## 2015-07-13 ENCOUNTER — Telehealth: Payer: Self-pay | Admitting: *Deleted

## 2015-07-13 ENCOUNTER — Encounter: Payer: Self-pay | Admitting: Family

## 2015-07-13 ENCOUNTER — Ambulatory Visit (INDEPENDENT_AMBULATORY_CARE_PROVIDER_SITE_OTHER): Payer: 59 | Admitting: Family

## 2015-07-13 VITALS — BP 124/76 | HR 77 | Temp 99.2°F | Resp 18 | Ht 65.0 in | Wt 340.6 lb

## 2015-07-13 DIAGNOSIS — F329 Major depressive disorder, single episode, unspecified: Secondary | ICD-10-CM | POA: Diagnosis not present

## 2015-07-13 DIAGNOSIS — F32A Depression, unspecified: Secondary | ICD-10-CM | POA: Insufficient documentation

## 2015-07-13 DIAGNOSIS — Z0000 Encounter for general adult medical examination without abnormal findings: Secondary | ICD-10-CM | POA: Diagnosis not present

## 2015-07-13 DIAGNOSIS — Z23 Encounter for immunization: Secondary | ICD-10-CM | POA: Diagnosis not present

## 2015-07-13 DIAGNOSIS — Z0001 Encounter for general adult medical examination with abnormal findings: Secondary | ICD-10-CM | POA: Diagnosis not present

## 2015-07-13 LAB — HEPATIC FUNCTION PANEL
ALBUMIN: 3.7 g/dL (ref 3.5–5.2)
ALT: 11 U/L (ref 0–35)
AST: 12 U/L (ref 0–37)
Alkaline Phosphatase: 73 U/L (ref 39–117)
Bilirubin, Direct: 0.1 mg/dL (ref 0.0–0.3)
TOTAL PROTEIN: 7.4 g/dL (ref 6.0–8.3)
Total Bilirubin: 0.4 mg/dL (ref 0.2–1.2)

## 2015-07-13 LAB — URINALYSIS, ROUTINE W REFLEX MICROSCOPIC
Bilirubin Urine: NEGATIVE
KETONES UR: NEGATIVE
LEUKOCYTES UA: NEGATIVE
NITRITE: POSITIVE — AB
PH: 5.5 (ref 5.0–8.0)
Total Protein, Urine: 30 — AB
URINE GLUCOSE: NEGATIVE
UROBILINOGEN UA: 0.2 (ref 0.0–1.0)

## 2015-07-13 LAB — LIPID PANEL
CHOLESTEROL: 249 mg/dL — AB (ref 0–200)
HDL: 65.4 mg/dL (ref >39.00)
LDL Cholesterol: 150 mg/dL — ABNORMAL HIGH (ref 0–99)
NONHDL: 183.79
Total CHOL/HDL Ratio: 4
Triglycerides: 171 mg/dL — ABNORMAL HIGH (ref 0.0–149.0)
VLDL: 34.2 mg/dL (ref 0.0–40.0)

## 2015-07-13 LAB — CBC WITH DIFFERENTIAL/PLATELET
BASOS PCT: 0.5 % (ref 0.0–3.0)
Basophils Absolute: 0 10*3/uL (ref 0.0–0.1)
EOS PCT: 3.1 % (ref 0.0–5.0)
Eosinophils Absolute: 0.3 10*3/uL (ref 0.0–0.7)
HEMATOCRIT: 37.2 % (ref 36.0–46.0)
Hemoglobin: 12.6 g/dL (ref 12.0–15.0)
LYMPHS PCT: 37.9 % (ref 12.0–46.0)
Lymphs Abs: 3.2 10*3/uL (ref 0.7–4.0)
MCHC: 33.7 g/dL (ref 30.0–36.0)
MCV: 84.8 fl (ref 78.0–100.0)
MONOS PCT: 6.2 % (ref 3.0–12.0)
Monocytes Absolute: 0.5 10*3/uL (ref 0.1–1.0)
NEUTROS PCT: 52.3 % (ref 43.0–77.0)
Neutro Abs: 4.4 10*3/uL (ref 1.4–7.7)
Platelets: 288 10*3/uL (ref 150.0–400.0)
RBC: 4.39 Mil/uL (ref 3.87–5.11)
RDW: 13.8 % (ref 11.5–15.5)
WBC: 8.4 10*3/uL (ref 4.0–10.5)

## 2015-07-13 LAB — BASIC METABOLIC PANEL
BUN: 14 mg/dL (ref 6–23)
CHLORIDE: 100 meq/L (ref 96–112)
CO2: 26 mEq/L (ref 19–32)
Calcium: 9.3 mg/dL (ref 8.4–10.5)
Creatinine, Ser: 0.72 mg/dL (ref 0.40–1.20)
GFR: 113.35 mL/min (ref >60.00)
Glucose, Bld: 203 mg/dL — ABNORMAL HIGH (ref 70–99)
POTASSIUM: 4.3 meq/L (ref 3.5–5.1)
SODIUM: 136 meq/L (ref 135–145)

## 2015-07-13 LAB — TSH: TSH: 4.4 u[IU]/mL (ref 0.35–4.50)

## 2015-07-13 MED ORDER — BUPROPION HCL 75 MG PO TABS: ORAL_TABLET | ORAL | Status: DC

## 2015-07-13 NOTE — Assessment & Plan Note (Signed)
Uncontrolled.  Will give trial of wellbutrin. Advised pt on common side effects. Also advised pt to go to the ED if she develops SI/HI. Pt verbalizes understanding.

## 2015-07-13 NOTE — Assessment & Plan Note (Addendum)
Obtain routine lab work. Pt counseled on healthy diet, exercise, weight loss. Refer for mammogram. Tdap today.

## 2015-07-13 NOTE — Telephone Encounter (Signed)
-----   Message from [name], NP sent at 07/13/2015  1:29 PM EST ----- Is it possible for lab to add on Urine culture, dx abnormal UA? please

## 2015-07-13 NOTE — Progress Notes (Signed)
Subjective:    Patient ID: Audrey Goodman, female    DOB: [birth year], 45 y.o.   MRN: [id number]  HPI  Patient presents today for complete physical.  Immunizations: due for tdap Diet: working on diet, has lost 17 pounds.  Decided not to pursue bariatric surgery Exercise: not exercising but plans to resume.  Pap Smear: 07/27/14- negative Mammogram: 07/28/14 1/17- eye exam Dental:  12/16  Wt Readings from Last 3 Encounters:  07/13/15 340 lb 9.6 oz (154.495 kg)  08/30/14 357 lb (161.934 kg)  06/23/14 364 lb (165.109 kg)   Previously on Lexapro- stopped in December due to side effects. Reports lexapro made her "even more tired." felt more tired.   Sometimes has trouble getting out of bed. Feels like she just "doesn't care." Admits that depression has likely played a role in not caring for herself and her diabetes.  Reports that she sleeps too much.  Denies SI/HI  Review of Systems  Constitutional: Negative for fever, fatigue and unexpected weight change.  HENT: Negative for congestion, rhinorrhea, tinnitus, trouble swallowing and voice change.   Eyes: Negative for visual disturbance.  Respiratory: Negative for cough and shortness of breath.   Cardiovascular: Negative for chest pain and palpitations.  Gastrointestinal: Negative for nausea, diarrhea, constipation and blood in stool.       Denies LE edema  Genitourinary: Negative for dysuria and frequency.  Musculoskeletal: Negative for myalgias.       Mild joint stiffness  Skin: Negative for rash.  Neurological: Negative for dizziness.   Past Medical History  Diagnosis Date  . History of chicken pox   . Depression   . Diverticulitis   . Hypertension   . Hyperlipidemia   . Shingles   . Type 2 diabetes mellitus, uncontrolled (HCC) 07/30/2006    Qualifier: Diagnosis of  By: Lanier Prude  MD, Cathrine Muster    . SVD (spontaneous vaginal delivery) 1995    x 1  . Sleep apnea     uses CPAP nightly  . Diabetes mellitus without complication  (HCC)     type 2  . Arthritis     left shoulder    Social History   Social History  . Marital Status: Single    Spouse Name: N/A  . Number of Children: N/A  . Years of Education: N/A   Occupational History  . Not on file.   Social History Main Topics  . Smoking status: Never Smoker   . Smokeless tobacco: Never Used  . Alcohol Use: 0.0 oz/week    0 Standard drinks or equivalent per week     Comment: socially  . Drug Use: No  . Sexual Activity: Yes    Birth Control/ Protection: IUD     Comment: Mirena   Other Topics Concern  . Not on file   Social History Narrative   Single- lives with boyfriend.   Has a boyfriend   Son 71- senior at [employer]   Enjoys reading   Works as a Advertising account planner at [employer]        Past Surgical History  Procedure Laterality Date  . Cholecystectomy  2008  . Ankle fracture surgery  1991    right  . Hysteroscopy N/A 09/13/2014    Procedure: HYSTEROSCOPY with IUD removal;  Surgeon: Mitchel Honour, DO;  Location: [facility];  Service: Gynecology;  Laterality: N/A;  need longest speculum and instruments that aresavailable due to patient's habitus (BMI ~60)    Family History  Problem Relation Age of  Onset  . Cancer Mother     breast  . Hyperlipidemia Mother   . Heart disease Mother   . Hypertension Mother   . Mental illness Mother     depression  . Diabetes Mother   . Hyperlipidemia Father   . Stroke Father   . Hypertension Father   . Diabetes Maternal Uncle   . Cancer Maternal Grandmother     colon  . Diabetes Maternal Grandmother     No Known Allergies  Current Outpatient Prescriptions on File Prior to Visit  Medication Sig Dispense Refill  . furosemide (LASIX) 40 MG tablet Take 40 mg by mouth at bedtime as needed for fluid or edema.     Marland Kitchen ibuprofen (ADVIL,MOTRIN) 800 MG tablet Take 1 tablet (800 mg total) by mouth every 6 (six) hours as needed. 30 tablet 0  . insulin aspart (NOVOLOG FLEXPEN) 100 UNIT/ML FlexPen Inject 35 Units  into the skin 3 (three) times daily with meals. **PT NEEDS APPT FOR FURTHER REFILLS** 10 pen 1  . Insulin Detemir (LEVEMIR FLEXTOUCH) 100 UNIT/ML Pen Inject 50 Units into the skin daily at 10 pm. **LAST REFILL** 15 mL 1  . Insulin Pen Needle (CAREFINE PEN NEEDLES) 32G X 4 MM MISC Use to inject insulin 4 times daily as instructed. 130 each 5  . lisinopril (PRINIVIL,ZESTRIL) 40 MG tablet Take 1 tablet (40 mg total) by mouth daily. **LAST REFILL**PT NEEDS OFFICE VISIT WITH DR GHERGHE** 90 tablet 0  . metFORMIN (GLUCOPHAGE) 850 MG tablet Take 1 tablet (850 mg total) by mouth 2 (two) times daily with a meal. **PT NEEDS FOLLOW UP APPT FOR FURTHER REFILLS** 180 tablet 0  . norgestimate-ethinyl estradiol (ORTHO-CYCLEN,SPRINTEC,PREVIFEM) 0.25-35 MG-MCG tablet Take 1 tablet by mouth daily.    . ONE TOUCH ULTRA TEST test strip USE TO TEST BLOOD SUGAR 3 TIMES DAILY AS INSTRUCTED 100 each 2  . spironolactone (ALDACTONE) 50 MG tablet TAKE 1 TABLET (50 MG TOTAL) BY MOUTH 2 (TWO) TIMES DAILY. 60 tablet 1   No current facility-administered medications on file prior to visit.    BP 124/76 mmHg  Pulse 77  Temp(Src) 99.2 F (37.3 C) (Oral)  Resp 18  Ht  (1.651 m)  Wt 340 lb 9.6 oz (154.495 kg)  BMI 56.68 kg/m2  SpO2 100%  LMP 07/06/2015       Objective:   Physical Exam Physical Exam  Constitutional: Morbidly obese AA female.  She is oriented to person, place, and time.  No distress.  HENT:  Head: Normocephalic and atraumatic.  Right Ear: Tympanic membrane and ear canal normal.  Left Ear: Tympanic membrane and ear canal normal.  Mouth/Throat: Oropharynx is clear and moist.  Eyes: Pupils are equal, round, and reactive to light. No scleral icterus.  Neck: Normal range of motion. No thyromegaly present.  Cardiovascular: Normal rate and regular rhythm.   No murmur heard. Pulmonary/Chest: Effort normal and breath sounds normal. No respiratory distress. He has no wheezes. She has no rales. She  exhibits no tenderness.  Abdominal: Soft. Bowel sounds are normal. He exhibits no distension and no mass. There is no tenderness. There is no rebound and no guarding.  Musculoskeletal: She exhibits no edema.  Lymphadenopathy:    She has no cervical adenopathy.  Neurological: She is alert and oriented to person, place, and time. She exhibits normal muscle tone. Coordination normal.  Skin: Skin is warm and dry. Hirsuitism noted on neck Psychiatric: She has mildly flat affect. Her behavior is normal. Judgment  and thought content normal.  Breasts: Examined lying Right: Without masses, retractions, discharge or axillary adenopathy.  Left: Without masses, retractions, discharge or axillary adenopathy.  Pelvic: deferred to GYN          Assessment & Plan:          Assessment & Plan:  EKG tracing is personally reviewed.  EKG notes NSR.  No acute changes.   15 minutes spent with pt today discussing depression.  >50% of this time was spent counseling patient on depression/treatment.

## 2015-07-13 NOTE — Telephone Encounter (Signed)
Add on form faxed to lab, awaiting result.

## 2015-07-13 NOTE — Telephone Encounter (Signed)
Left message for pt to return my call.  Need to document tdap in [facility] and found a possible match. Has pt ever lived in [place].

## 2015-07-13 NOTE — Addendum Note (Signed)
Addended by: Mervin Kung A on: 07/13/2015 10:16 AM   Modules accepted: Orders

## 2015-07-13 NOTE — Progress Notes (Signed)
Pre visit review using our clinic review tool, if applicable. No additional management support is needed unless otherwise documented below in the visit note. 

## 2015-07-13 NOTE — Telephone Encounter (Signed)
Pt called back and confirmed previous address. [facility] updated.

## 2015-07-13 NOTE — Patient Instructions (Signed)
Please complete lab work prior to leaving. Continue to work on [employer], exercise and weight loss. Start wellbutrin 1 tab once daily for 1 week, then increase to 1 tab twice daily on week two. You will be contacted about your mammogram. Please keep your upcoming appointment with [name].

## 2015-07-14 ENCOUNTER — Other Ambulatory Visit: Payer: 59

## 2015-07-14 ENCOUNTER — Encounter: Payer: Self-pay | Admitting: General Practice

## 2015-07-14 DIAGNOSIS — R8290 Unspecified abnormal findings in urine: Secondary | ICD-10-CM

## 2015-07-16 LAB — URINE CULTURE

## 2015-07-17 ENCOUNTER — Other Ambulatory Visit: Payer: Self-pay | Admitting: Family

## 2015-07-17 DIAGNOSIS — E039 Hypothyroidism, unspecified: Secondary | ICD-10-CM

## 2015-07-17 DIAGNOSIS — E785 Hyperlipidemia, unspecified: Secondary | ICD-10-CM

## 2015-07-17 DIAGNOSIS — R946 Abnormal results of thyroid function studies: Principal | ICD-10-CM

## 2015-07-17 MED ORDER — NITROFURANTOIN MACROCRYSTAL 100 MG PO CAPS: 100.0000 mg | ORAL_CAPSULE | Freq: Two times a day (BID) | ORAL | Status: DC

## 2015-07-17 NOTE — Telephone Encounter (Signed)
Urine culture + for infection. Start macrodantin. Cholesterol elevated- recommend that she start statin, however she needs to continue OCP while taking statin as not safe to become pregnant on statin. Liver, kidney function normal.  Thyroid normal, but borderline for underactive.  I would recommend that she repeat lipid panel (hypothyroid)  tsh, free t3 and free t4  (borderline hypothyroid)  in 3 months.

## 2015-07-18 MED ORDER — ATORVASTATIN CALCIUM 40 MG PO TABS: 40.0000 mg | ORAL_TABLET | Freq: Every day | ORAL | Status: DC

## 2015-07-18 NOTE — Telephone Encounter (Signed)
Notified pt and she voices understanding. Rx sent, lab appt scheduled for 10/17/15 and future orders entered.

## 2015-07-18 NOTE — Telephone Encounter (Signed)
Left message for pt to return my call.

## 2015-07-27 ENCOUNTER — Other Ambulatory Visit (INDEPENDENT_AMBULATORY_CARE_PROVIDER_SITE_OTHER): Payer: 59 | Admitting: *Deleted

## 2015-07-27 ENCOUNTER — Encounter: Payer: Self-pay | Admitting: Internal Medicine

## 2015-07-27 ENCOUNTER — Other Ambulatory Visit: Payer: Self-pay | Admitting: *Deleted

## 2015-07-27 ENCOUNTER — Ambulatory Visit (INDEPENDENT_AMBULATORY_CARE_PROVIDER_SITE_OTHER): Payer: 59 | Admitting: Internal Medicine

## 2015-07-27 VITALS — BP 118/62 | HR 85 | Temp 99.0°F | Resp 12 | Wt 342.0 lb

## 2015-07-27 DIAGNOSIS — E282 Polycystic ovarian syndrome: Secondary | ICD-10-CM | POA: Diagnosis not present

## 2015-07-27 DIAGNOSIS — E1165 Type 2 diabetes mellitus with hyperglycemia: Secondary | ICD-10-CM

## 2015-07-27 LAB — POCT GLYCOSYLATED HEMOGLOBIN (HGB A1C): HEMOGLOBIN A1C: 7.9

## 2015-07-27 MED ORDER — LISINOPRIL 40 MG PO TABS: 40.0000 mg | ORAL_TABLET | Freq: Every day | ORAL | Status: DC

## 2015-07-27 MED ORDER — METFORMIN HCL 850 MG PO TABS: 850.0000 mg | ORAL_TABLET | Freq: Two times a day (BID) | ORAL | Status: DC

## 2015-07-27 MED ORDER — INSULIN DETEMIR 100 UNIT/ML FLEXPEN: 50.0000 [IU] | PEN_INJECTOR | Freq: Every day | SUBCUTANEOUS | Status: DC

## 2015-07-27 MED ORDER — GLUCOSE BLOOD VI STRP: ORAL_STRIP | Status: DC

## 2015-07-27 MED ORDER — INSULIN PEN NEEDLE 32G X 4 MM MISC: Status: DC

## 2015-07-27 NOTE — Patient Instructions (Signed)
Please continue: - Metformin 850 mg 2x a day  - Lantus 50 units in am  - Novolog insulin with meals as follows:  - smaller meal: 20 units - large meal: 30 units  - NovoLog SSI:  - 150- 165: + 2 unit  - 166- 180: + 3 units  - 181- 195: + 4 units  - 196- 210: + 5 units  - 211- 225: + 6 units  - 226- 240: + 7 units  - 241- 255: + 8 units  - >255: + 9 units   Continue Spironolactone 50 mg x 2 a day.  Please stop at the lab.  Please come back for a follow-up appointment in 3 months with your sugar log.

## 2015-07-27 NOTE — Progress Notes (Signed)
Patient ID: Audrey Goodman, female   DOB: 1971-03-31, 45 y.o.   MRN: [id number]  HPI: AGATHA DUPLECHAIN is a 45 y.o.-year-old female, returning for f/u for DM2, dx with GDM in 1995, then DM 6 mo after son was born, insulin-dependent since ~2010, uncontrolled, without complications and PCOS. Last visit 13 mo ago!  No GBP as she could not afford it >> depressed >> started Wellbutrin (started last week) >> feels better.  She lost 15 lbs since last Spring (had a low carb meal replacement before the surgery).   Last hemoglobin A1c was: Lab Results  Component Value Date   HGBA1C 10.2* 06/22/2014   HGBA1C 8.6* 03/22/2014   HGBA1C 9.3* 09/10/2013   Pt is on a regimen of: - Metformin 850 mg 2x a day  - Levemir 50 units in am  - Novolog insulin with meals as follows:  - smaller meal: 20 units  - large meal: 30 units  (- NovoLog SSI:  - 150- 165: + 2 unit  - 166- 180: + 3 units  - 181- 195: + 4 units  - 196- 210: + 5 units  - 211- 225: + 6 units  - 226- 240: + 7 units  - 241- 255: + 8 units  - >255: + 9 units)  Pt checks her sugars 0-1x a day: - am: 120-200 >> 90-150 >> 100-130 (uses 23 units  - shake) >> 120s - 2h after b'fast: n/c - before lunch: (90) 120-225 >> 150-160 >> 150-170 >> n/c - 2h after lunch: n/c >> 113 >> n/c - before dinner: 120-220 >> 150s (rarely 200) >> 140-160 >> n/c - 2h after dinner: n/c - bedtime: n/c >> 150s >> n/c - nighttime: n/c No more lows at night. Lowest sugar was 80 >> 59 (on the diet, 1 year ago); she has hypoglycemia awareness at 80.  Highest sugar was 170 >> 160.  Still drinking regular sodas.  - No CKD, last BUN/creatinine:  Lab Results  Component Value Date   BUN 14 07/13/2015   CREATININE 0.72 07/13/2015  On Lisinopril. - last set of lipids: Lab Results  Component Value Date   CHOL 249* 07/13/2015   HDL 65.40 07/13/2015   LDLCALC 150* 07/13/2015   TRIG 171.0* 07/13/2015   CHOLHDL 4 07/13/2015  On Zetia, Crestor 40.  - last eye exam  was: Abstract on 07/14/2015  Component Date Value Ref Range Status  . HM Diabetic Eye Exam 06/29/2015 No Retinopathy  No Retinopathy Final   - no numbness and tingling in her feet.  She is thinking about GBP >> needs clearance from DM pov. She will have sleeve gastrectomy.  PCOS: - has had irregular menses since menarche - had 1 pregnancy, no miscarriages - was on Mirena IUD - for ~4.5 years >> OCP (Sprintec) in the last year - On Spironolactone 50 mg bid  - tolerates it well. BP normal.  - + significant hirsutism >> much improved on Spironolactone - on Metformin 850 mg bid Last K normal: Lab Results  Component Value Date   K 4.3 07/13/2015   ROS: Constitutional: no weight gain/loss, no fatigue, no subjective hyperthermia/hypothermia Eyes: no blurry vision, no xerophthalmia ENT: no sore throat, no nodules palpated in throat, no dysphagia/odynophagia, no hoarseness Cardiovascular: no CP/SOB/palpitations/leg swelling Respiratory: no cough/SOB Gastrointestinal: no N/V/D/C Musculoskeletal: no muscle/joint aches Skin: no rashes Neurological: no tremors/numbness/tingling/dizziness  PE: BP 118/62 mmHg  Pulse 85  Temp(Src) 99 F (37.2 C) (Oral)  Resp 12  Wt 342 lb (155.13 kg)  SpO2 97%  LMP 07/06/2015 Wt Readings from Last 3 Encounters:  07/27/15 342 lb (155.13 kg)  07/13/15 340 lb 9.6 oz (154.495 kg)  08/30/14 357 lb (161.934 kg)   Constitutional: obese, in NAD Eyes: PERRLA, EOMI, no exophthalmos ENT: moist mucous membranes, no thyromegaly, no cervical lymphadenopathy Cardiovascular: RRR, No MRG Respiratory: CTA B Gastrointestinal: abdomen soft, NT, ND, BS+ Musculoskeletal: no deformities, strength intact in all 4 Skin: moist, warm, + rash on chin from shaving; +++ hirsutism on chin and chest; no acne Neurological: no tremor with outstretched hands, DTR normal in all 4  ASSESSMENT: 1. DM2, insulin-dependent, uncontrolled, without complications  2. PCOS Component      Latest Ref Rng 10/16/2013  Testosterone     10 - 70 ng/dL 54  Sex Hormone Binding     18 - 114 nmol/L 12 (L)  Testosterone Free     0.6 - 6.8 pg/mL 15.5 (H)  Testosterone-% Free     0.4 - 2.4 % 2.9 (H)  TSH     0.35 - 4.50 uIU/mL 0.54  17-OH-Progesterone, LC/MS/MS      <8  Free T4     0.60 - 1.60 ng/dL 2.13  T3, Free     2.3 - 4.2 pg/mL 3.0   Labs confirmed PCOS (high testosterone, low 17-HO Prog, normal TFTs). We could not test LH and FSH since on Mirena.  PLAN:  1. DM2 Patient with long-standing, uncontrolled diabetes, on basal-bolus insulin regimen, with improved sugars in last year. However, she only checks sugars in am >> advised to also check later in the day - given logs -  I suggested to:  Patient Instructions  Please continue: - Metformin 850 mg 2x a day  - Lantus 50 units in am  - Novolog insulin with meals as follows:  - smaller meal: 20 units - large meal: 30 units  - NovoLog SSI:  - 150- 165: + 2 unit  - 166- 180: + 3 units  - 181- 195: + 4 units  - 196- 210: + 5 units  - 211- 225: + 6 units  - 226- 240: + 7 units  - 241- 255: + 8 units  - >255: + 9 units   Continue Spironolactone 50 mg x 2 a day.  Please stop at the lab.  Please come back for a follow-up appointment in 3 months with your sugar log.  - start checking sugars at different times of the day - check 3 times a day, rotating checks - advised for yearly eye exams, she is up to date - checked HbA1c today >> 7.9% (great decrease!) - Return to clinic in 3 mo with sugar log   2. 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 Hemoglobin A1C 07/27/2015 7.9   Final  Office Visit on 07/27/2015  Component Date  Value Ref Range Status  . Testosterone 07/27/2015 24  8 - 48 ng/dL Final  . Testosterone, Free 07/27/2015 2.0  0.0 - 4.2 pg/mL Final   Excellent results!!!

## 2015-07-29 LAB — TESTOSTERONE,FREE AND TOTAL
TESTOSTERONE: 24 ng/dL (ref 8–48)
Testosterone, Free: 2 pg/mL (ref 0.0–4.2)

## 2015-07-30 ENCOUNTER — Encounter: Payer: Self-pay | Admitting: Family

## 2015-08-02 ENCOUNTER — Other Ambulatory Visit: Payer: Self-pay | Admitting: Internal Medicine

## 2015-08-10 ENCOUNTER — Encounter: Payer: Self-pay | Admitting: Family

## 2015-08-10 ENCOUNTER — Ambulatory Visit (INDEPENDENT_AMBULATORY_CARE_PROVIDER_SITE_OTHER): Payer: 59 | Admitting: Family

## 2015-08-10 VITALS — BP 120/70 | HR 87 | Temp 98.9°F | Resp 16 | Ht 65.0 in | Wt 336.2 lb

## 2015-08-10 DIAGNOSIS — F329 Major depressive disorder, single episode, unspecified: Secondary | ICD-10-CM | POA: Diagnosis not present

## 2015-08-10 DIAGNOSIS — F32A Depression, unspecified: Secondary | ICD-10-CM

## 2015-08-10 MED ORDER — BUPROPION HCL ER (XL) 150 MG PO TB24: 150.0000 mg | ORAL_TABLET | Freq: Every day | ORAL | Status: DC

## 2015-08-10 NOTE — Assessment & Plan Note (Signed)
Improved. She would like an extended release version. Will change wellbutrin to xr.

## 2015-08-10 NOTE — Progress Notes (Signed)
Subjective:    Patient ID: Audrey Goodman, female    DOB: [birth year], 45 y.o.   MRN: [id number]  HPI  Audrey  Goodman is a 45 yr old female who presents today for follow up of her depression.  Last visit she began wellbutrin. Feels like her mood is more even.  Denies insomnia, daytime fatigue.  Denies anxiety. Feels like it is easier to get up in the AM.  She is trying to limit portions and has lost 6 pounds.  Wt Readings from Last 3 Encounters:  08/10/15 336 lb 3.2 oz (152.499 kg)  07/27/15 342 lb (155.13 kg)  07/13/15 340 lb 9.6 oz (154.495 kg)      Review of Systems  Respiratory: Negative for shortness of breath.   Cardiovascular: Negative for chest pain, palpitations and leg swelling.  Gastrointestinal: Negative for nausea and diarrhea.   Past Medical History  Diagnosis Date  . History of chicken pox   . Depression   . Diverticulitis   . Hypertension   . Hyperlipidemia   . Shingles   . Type 2 diabetes mellitus, uncontrolled (HCC) 07/30/2006    Qualifier: Diagnosis of  By: Lanier Prude  MD, Cathrine Muster    . SVD (spontaneous vaginal delivery) 1995    x 1  . Sleep apnea     uses CPAP nightly  . Diabetes mellitus without complication (HCC)     type 2  . Arthritis     left shoulder    Social History   Social History  . Marital Status: Single    Spouse Name: N/A  . Number of Children: N/A  . Years of Education: N/A   Occupational History  . Not on file.   Social History Main Topics  . Smoking status: Never Smoker   . Smokeless tobacco: Never Used  . Alcohol Use: 0.0 oz/week    0 Standard drinks or equivalent per week     Comment: socially  . Drug Use: No  . Sexual Activity: Yes    Birth Control/ Protection: IUD     Comment: Mirena   Other Topics Concern  . Not on file   Social History Narrative   Single- lives with boyfriend.   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 Cholecystectomy  2008  . Ankle fracture surgery  1991    right  . Hysteroscopy N/A 09/13/2014    Procedure: HYSTEROSCOPY with IUD removal;  Surgeon: Mitchel Honour, DO;  Location: [facility];  Service: Gynecology;  Laterality: N/A;  need longest speculum and instruments that aresavailable due to patient's habitus (BMI ~60)    Family History  Problem Relation Age of Onset  . Cancer Mother     breast  . Hyperlipidemia Mother   . Heart disease Mother   . Hypertension Mother   . Mental illness Mother     depression  . Diabetes Mother   . Hyperlipidemia Father   . Stroke Father   . Hypertension Father   . Diabetes Maternal Uncle   . Cancer Maternal Grandmother     colon  . Diabetes Maternal Grandmother     Allergies  Allergen Reactions  . Lipitor [Atorvastatin] Other (See Comments)    Myalgia/foot cramping    Current Outpatient Prescriptions on File Prior to Visit  Medication Sig Dispense Refill  .  buPROPion (WELLBUTRIN) 75 MG tablet One tab by mouth once daily for 1 week, then increase to one tab twice daily on week two 60 tablet 0  . furosemide (LASIX) 40 MG tablet Take 40 mg by mouth at bedtime as needed for fluid or edema.     Marland Kitchen. glucose blood (ONE TOUCH ULTRA TEST) test strip USE TO TEST BLOOD SUGAR 3 TIMES DAILY AS INSTRUCTED 200 each 11  . ibuprofen (ADVIL,MOTRIN) 800 MG tablet Take 1 tablet (800 mg total) by mouth every 6 (six) hours as needed. 30 tablet 0  . insulin aspart (NOVOLOG FLEXPEN) 100 UNIT/ML FlexPen Inject 35 Units into the skin 3 (three) times daily with meals. **PT NEEDS APPT FOR FURTHER REFILLS** 10 pen 1  . Insulin Detemir (LEVEMIR FLEXTOUCH) 100 UNIT/ML Pen Inject 50 Units into the skin daily at 10 pm. 45 mL 1  . Insulin Pen Needle (CAREFINE PEN NEEDLES) 32G X 4 MM MISC Use to inject insulin 4 times daily as instructed. 200 each 11  . lisinopril (PRINIVIL,ZESTRIL) 40 MG tablet Take 1 tablet (40 mg total) by mouth daily. 90  tablet 1  . metFORMIN (GLUCOPHAGE) 850 MG tablet Take 1 tablet (850 mg total) by mouth 2 (two) times daily with a meal. 180 tablet 1  . norgestimate-ethinyl estradiol (ORTHO-CYCLEN,SPRINTEC,PREVIFEM) 0.25-35 MG-MCG tablet Take 1 tablet by mouth daily.    Marland Kitchen. spironolactone (ALDACTONE) 50 MG tablet TAKE 1 TABLET BY MOUTH TWICE A DAY 60 tablet 2   No current facility-administered medications on file prior to visit.    BP 120/70 mmHg  Pulse 87  Temp(Src) 98.9 F (37.2 C) (Oral)  Resp 16  Ht 5\' 5"  (1.651 m)  Wt 336 lb 3.2 oz (152.499 kg)  BMI 55.95 kg/m2  SpO2 100%  LMP 07/06/2015       Objective:   Physical Exam  Constitutional: She is oriented to person, place, and time. She appears well-developed and well-nourished.  HENT:  Head: Normocephalic and atraumatic.  Cardiovascular: Normal rate, regular rhythm and normal heart sounds.   No murmur heard. Pulmonary/Chest: Effort normal and breath sounds normal. No respiratory distress. She has no wheezes.  Neurological: She is alert and oriented to person, place, and time.  Psychiatric: She has a normal mood and affect. Her behavior is normal. Judgment and thought content normal.          Assessment & Plan:

## 2015-08-10 NOTE — Assessment & Plan Note (Signed)
She is losing weight, continue weight loss efforts.

## 2015-08-10 NOTE — Patient Instructions (Signed)
Continue wellbutrin (this has been changed to extended release). Follow up in 3 months.

## 2015-08-10 NOTE — Progress Notes (Signed)
Pre visit review using our clinic review tool, if applicable. No additional management support is needed unless otherwise documented below in the visit note. 

## 2015-09-15 ENCOUNTER — Encounter: Payer: Self-pay | Admitting: Family

## 2015-09-15 MED ORDER — BUPROPION HCL 75 MG PO TABS: 75.0000 mg | ORAL_TABLET | Freq: Two times a day (BID) | ORAL | Status: DC

## 2015-09-22 ENCOUNTER — Other Ambulatory Visit: Payer: Self-pay | Admitting: Internal Medicine

## 2015-09-23 ENCOUNTER — Other Ambulatory Visit: Payer: Self-pay | Admitting: *Deleted

## 2015-09-23 MED ORDER — SPIRONOLACTONE 50 MG PO TABS: 50.0000 mg | ORAL_TABLET | Freq: Two times a day (BID) | ORAL | Status: DC

## 2015-09-23 NOTE — Telephone Encounter (Signed)
Pharmacy requested 90 day supply of Spironolactone due to ins.

## 2015-10-04 ENCOUNTER — Encounter: Payer: Self-pay | Admitting: Internal Medicine

## 2015-10-04 ENCOUNTER — Other Ambulatory Visit: Payer: Self-pay | Admitting: *Deleted

## 2015-10-04 MED ORDER — LISINOPRIL 40 MG PO TABS: 40.0000 mg | ORAL_TABLET | Freq: Every day | ORAL | Status: DC

## 2015-10-04 NOTE — Telephone Encounter (Signed)
Pt requested refill of lisinopril via MyChart.

## 2015-10-17 ENCOUNTER — Other Ambulatory Visit: Payer: 59

## 2015-10-26 ENCOUNTER — Ambulatory Visit: Payer: 59 | Admitting: Internal Medicine

## 2015-10-31 ENCOUNTER — Other Ambulatory Visit: Payer: Self-pay | Admitting: Family

## 2015-10-31 NOTE — Telephone Encounter (Signed)
Rx sent to the pharmacy by e-script.//AB/CMA 

## 2015-11-02 ENCOUNTER — Telehealth: Payer: Self-pay | Admitting: Family

## 2015-11-09 ENCOUNTER — Other Ambulatory Visit: Payer: Self-pay

## 2015-11-09 MED ORDER — SPIRONOLACTONE 50 MG PO TABS: 50.0000 mg | ORAL_TABLET | Freq: Two times a day (BID) | ORAL | Status: DC

## 2015-11-25 ENCOUNTER — Ambulatory Visit: Payer: 59 | Admitting: Family

## 2015-11-28 ENCOUNTER — Telehealth: Payer: Self-pay | Admitting: Internal Medicine

## 2015-11-28 ENCOUNTER — Other Ambulatory Visit: Payer: Self-pay

## 2015-11-28 ENCOUNTER — Other Ambulatory Visit: Payer: Self-pay | Admitting: Internal Medicine

## 2015-11-28 MED ORDER — INSULIN ASPART 100 UNIT/ML FLEXPEN: PEN_INJECTOR | SUBCUTANEOUS | Status: DC

## 2015-11-28 NOTE — Telephone Encounter (Signed)
Pharmacy is requesting a 90 day supply of the flex pens please

## 2015-12-07 ENCOUNTER — Other Ambulatory Visit: Payer: Self-pay

## 2015-12-07 MED ORDER — INSULIN ASPART 100 UNIT/ML FLEXPEN: PEN_INJECTOR | SUBCUTANEOUS | Status: DC

## 2015-12-07 NOTE — Telephone Encounter (Signed)
Faxed refill request for novolog sent a 90 day supply to pharmacy.

## 2015-12-14 NOTE — Telephone Encounter (Signed)
Complete

## 2015-12-30 ENCOUNTER — Other Ambulatory Visit: Payer: Self-pay | Admitting: Family

## 2015-12-30 NOTE — Telephone Encounter (Signed)
Received a request from patient pharmacy. Atorvastatin is listed as an allergy and patient is not currently taking medication.

## 2016-01-20 ENCOUNTER — Other Ambulatory Visit: Payer: Self-pay | Admitting: Internal Medicine

## 2016-02-17 ENCOUNTER — Other Ambulatory Visit: Payer: Self-pay | Admitting: Family

## 2016-02-17 MED ORDER — BUPROPION HCL ER (XL) 150 MG PO TB24: 150.0000 mg | ORAL_TABLET | Freq: Every day | ORAL | 0 refills | Status: DC

## 2016-02-17 NOTE — Telephone Encounter (Signed)
Received eRx request from [facility] for Wellbutrin 150mg  qd, 90 day supply. Previously received 75mg  twice a day. Rx sent for 90 day supply of 150mg  strength. Pt has f/u with PCP on 02/24/16.

## 2016-02-24 ENCOUNTER — Ambulatory Visit: Payer: 59 | Admitting: Family

## 2016-02-24 ENCOUNTER — Encounter: Payer: Self-pay | Admitting: Family

## 2016-02-24 DIAGNOSIS — Z0289 Encounter for other administrative examinations: Secondary | ICD-10-CM

## 2016-03-20 ENCOUNTER — Emergency Department (HOSPITAL_COMMUNITY)
Admission: EM | Admit: 2016-03-20 | Discharge: 2016-03-20 | Disposition: A | Discharge status: Home / Self Care | Payer: 59 | Attending: Emergency Medicine | Admitting: Emergency Medicine

## 2016-03-20 ENCOUNTER — Telehealth: Payer: Self-pay | Admitting: Family

## 2016-03-20 ENCOUNTER — Telehealth: Payer: Self-pay

## 2016-03-20 ENCOUNTER — Encounter (HOSPITAL_COMMUNITY): Payer: Self-pay | Admitting: Emergency Medicine

## 2016-03-20 DIAGNOSIS — Z794 Long term (current) use of insulin: Secondary | ICD-10-CM | POA: Insufficient documentation

## 2016-03-20 DIAGNOSIS — Z7984 Long term (current) use of oral hypoglycemic drugs: Secondary | ICD-10-CM | POA: Diagnosis not present

## 2016-03-20 DIAGNOSIS — T7840XA Allergy, unspecified, initial encounter: Secondary | ICD-10-CM | POA: Insufficient documentation

## 2016-03-20 DIAGNOSIS — I10 Essential (primary) hypertension: Secondary | ICD-10-CM | POA: Diagnosis not present

## 2016-03-20 DIAGNOSIS — R220 Localized swelling, mass and lump, head: Secondary | ICD-10-CM | POA: Diagnosis present

## 2016-03-20 DIAGNOSIS — E119 Type 2 diabetes mellitus without complications: Secondary | ICD-10-CM | POA: Insufficient documentation

## 2016-03-20 DIAGNOSIS — Z79899 Other long term (current) drug therapy: Secondary | ICD-10-CM | POA: Diagnosis not present

## 2016-03-20 HISTORY — DX: Pure hypercholesterolemia, unspecified: E78.00

## 2016-03-20 MED ORDER — METHYLPREDNISOLONE SODIUM SUCC 125 MG IJ SOLR
125.0000 mg | Freq: Once | INTRAMUSCULAR | Status: AC
  Administered 2016-03-20: 125 mg via INTRAVENOUS
  Filled 2016-03-20: qty 2

## 2016-03-20 MED ORDER — FAMOTIDINE 20 MG PO TABS: 20.0000 mg | ORAL_TABLET | Freq: Two times a day (BID) | ORAL | 0 refills | Status: DC

## 2016-03-20 MED ORDER — DIPHENHYDRAMINE HCL 25 MG PO CAPS: 25.0000 mg | ORAL_CAPSULE | Freq: Four times a day (QID) | ORAL | 0 refills | Status: DC | PRN

## 2016-03-20 MED ORDER — FAMOTIDINE IN NACL 20-0.9 MG/50ML-% IV SOLN
20.0000 mg | Freq: Once | INTRAVENOUS | Status: AC
  Administered 2016-03-20: 20 mg via INTRAVENOUS
  Filled 2016-03-20: qty 50

## 2016-03-20 MED ORDER — PREDNISONE 10 MG PO TABS: 20.0000 mg | ORAL_TABLET | Freq: Every day | ORAL | 0 refills | Status: DC

## 2016-03-20 NOTE — ED Provider Notes (Signed)
[facility]-EMERGENCY DEPT Provider Note   CSN: [id number] Arrival date & time: 03/20/16  0608     History   Chief Complaint Chief Complaint  Patient presents with  . Oral Swelling    HPI Audrey Goodman is a 45 y.o. female.  HPI Audrey Goodman is a 45 y.o. female with DM, depression, HTN, presents to ED with complaint of tongue swelling. Pt states swelling started this morning. Denies pain or injuries. Denies swelling of lips, throat. Denies difficulty breathing. Denies hx of the same. Denies any allergies. Pt is on Lisinopril.   Past Medical History:  Diagnosis Date  . Arthritis    left shoulder  . Depression   . Diabetes mellitus without complication (HCC)    type 2  . Diverticulitis   . High cholesterol   . History of chicken pox   . Hyperlipidemia   . Hypertension   . Shingles   . Sleep apnea    uses CPAP nightly  . SVD (spontaneous vaginal delivery) 1995   x 1  . Type 2 diabetes mellitus, uncontrolled (HCC) 07/30/2006   Qualifier: Diagnosis of  By: Lanier PrudeBolden  MD, Taineisha      Patient Active Problem List   Diagnosis Date Noted  . Depression 07/13/2015  . Preventative health care 06/23/2014  . PCOS (polycystic ovarian syndrome) 09/10/2013  . CHOLELITHIASIS 02/20/2008  . HYPERLIPIDEMIA NEC/NOS 08/06/2006  . Type 2 diabetes mellitus, uncontrolled (HCC) 07/30/2006  . HYPERTENSION, BENIGN ESSENTIAL 07/30/2006  . MORBID OBESITY 07/25/2006    Past Surgical History:  Procedure Laterality Date  . ANKLE FRACTURE SURGERY  1991   right  . CHOLECYSTECTOMY  2008  . HYSTEROSCOPY N/A 09/13/2014   Procedure: HYSTEROSCOPY with IUD removal;  Surgeon: Mitchel HonourMegan Morris, DO;  Location: [facility];  Service: Gynecology;  Laterality: N/A;  need longest speculum and instruments that aresavailable due to patient's habitus (BMI ~60)    OB History    No data available       Home Medications    Prior to Admission medications   Medication Sig Start Date End Date Taking? Authorizing  Provider  buPROPion (WELLBUTRIN XL) 150 MG 24 hr tablet Take 1 tablet (150 mg total) by mouth daily. 02/17/16   Sandford CrazeMelissa O'Sullivan, NP  furosemide (LASIX) 40 MG tablet Take 40 mg by mouth at bedtime as needed for fluid or edema.     Historical Provider, MD  glucose blood (ONE TOUCH ULTRA TEST) test strip USE TO TEST BLOOD SUGAR 3 TIMES DAILY AS INSTRUCTED 07/27/15   Carlus Pavlovristina Gherghe, MD  ibuprofen (ADVIL,MOTRIN) 800 MG tablet Take 1 tablet (800 mg total) by mouth every 6 (six) hours as needed. 09/13/14   Megan Morris, DO  insulin aspart (NOVOLOG FLEXPEN) 100 UNIT/ML FlexPen INJECT 28 TO 35 UNITS INTO THE SKIN 3 TIMES DAILY BEFORE MEALS. 12/07/15   Carlus Pavlovristina Gherghe, MD  Insulin Pen Needle (CAREFINE PEN NEEDLES) 32G X 4 MM MISC Use to inject insulin 4 times daily as instructed. 07/27/15   Carlus Pavlovristina Gherghe, MD  LEVEMIR FLEXTOUCH 100 UNIT/ML Pen INJECT 50 UNITS INTO THE SKIN DAILY AT 10 PM. 01/20/16   Carlus Pavlovristina Gherghe, MD  lisinopril (PRINIVIL,ZESTRIL) 40 MG tablet Take 1 tablet (40 mg total) by mouth daily. 10/04/15   Carlus Pavlovristina Gherghe, MD  metFORMIN (GLUCOPHAGE) 850 MG tablet Take 1 tablet (850 mg total) by mouth 2 (two) times daily with a meal. 07/27/15   Carlus Pavlovristina Gherghe, MD  norgestimate-ethinyl estradiol (ORTHO-CYCLEN,SPRINTEC,PREVIFEM) 0.25-35 MG-MCG tablet Take 1 tablet by mouth  daily.    Historical Provider, MD  spironolactone (ALDACTONE) 50 MG tablet Take 1 tablet (50 mg total) by mouth 2 (two) times daily. 11/09/15   Carlus Pavlov, MD    Family History Family History  Problem Relation Age of Onset  . Cancer Mother     breast  . Hyperlipidemia Mother   . Heart disease Mother   . Hypertension Mother   . Mental illness Mother     depression  . Diabetes Mother   . Hyperlipidemia Father   . Stroke Father   . Hypertension Father   . Cancer Maternal Grandmother     colon  . Diabetes Maternal Grandmother   . Diabetes Maternal Uncle     Social History Social History  Substance Use Topics  .  Smoking status: Never Smoker  . Smokeless tobacco: Never Used  . Alcohol use No     Allergies   Lipitor [atorvastatin]   Review of Systems Review of Systems  Constitutional: Negative for chills and fever.  HENT: Negative for facial swelling, sore throat and trouble swallowing.        Tongue swelling  Respiratory: Negative for cough, chest tightness and shortness of breath.   Cardiovascular: Negative for chest pain, palpitations and leg swelling.  Gastrointestinal: Negative for abdominal pain, diarrhea, nausea and vomiting.  Musculoskeletal: Negative for arthralgias, myalgias, neck pain and neck stiffness.  Skin: Negative for rash.  Neurological: Negative for dizziness, weakness and headaches.  All other systems reviewed and are negative.    Physical Exam Updated Vital Signs BP 143/72 (BP Location: Left Arm)   Pulse 78   Temp 98.3 F (36.8 C) (Oral)   Resp 18   SpO2 100%   Physical Exam  Constitutional: She appears well-developed and well-nourished. No distress.  HENT:  Head: Normocephalic.  Left-sided tongue swelling. Normal lips, oropharynx, uvula.  Eyes: Conjunctivae are normal.  Neck: Neck supple.  Cardiovascular: Normal rate, regular rhythm and normal heart sounds.   Pulmonary/Chest: Effort normal and breath sounds normal. No respiratory distress. She has no wheezes. She has no rales.  No stridor  Musculoskeletal: She exhibits no edema.  Neurological: She is alert.  Skin: Skin is warm and dry.  Psychiatric: She has a normal mood and affect. Her behavior is normal.  Nursing note and vitals reviewed.    ED Treatments / Results  Labs (all labs ordered are listed, but only abnormal results are displayed) Labs Reviewed - No data to display  EKG  EKG Interpretation None       Radiology No results found.  Procedures Procedures (including critical care time)  Medications Ordered in ED Medications  famotidine (PEPCID) IVPB 20 mg premix (20 mg  Intravenous New Bag/Given 03/20/16 1610)  methylPREDNISolone sodium succinate (SOLU-MEDROL) 125 mg/2 mL injection 125 mg (125 mg Intravenous Given 03/20/16 0651)     Initial Impression / Assessment and Plan / ED Course  I have reviewed the triage vital signs and the nursing notes.  Pertinent labs & imaging results that were available during my care of the patient were reviewed by me and considered in my medical decision making (see chart for details).  Clinical Course    Patient seen and examined, patient with left-sided tongue swelling, no injuries, no pain. No difficulty breathing. No swelling to the throat, lips, no stridor on exam. Will give Solu-Medrol and Pepcid. Patient is driving home, requested not to be given any Benadryl at this time. She is in no acute distress. Most likely angioedema  from lisinopril. Will monitor.   Patient monitored for 3 hours. No worsening and swelling, patient states tongue swelling almost resolved. Most likely reaction from lisinopril. Stop lisinopril. Will discharge home with Benadryl, Pepcid, prednisone for 4 days. Follow-up with primary care doctor.  Vitals:   03/20/16 0622 03/20/16 0952  BP: 143/72 146/78  Pulse: 78 76  Resp: 18 16  Temp: 98.3 F (36.8 C)   TempSrc: Oral   SpO2: 100% 99%    Final Clinical Impressions(s) / ED Diagnoses   Final diagnoses:  None    New Prescriptions New Prescriptions   No medications on file     Jaynie Crumble, PA-C 03/20/16 1549    Lyndal Pulley, MD 03/20/16 2004

## 2016-03-20 NOTE — Telephone Encounter (Signed)
Attempted to contact patient, no answer and mailbox full at this time so no message left. Would advise patient to stop lisinopril, and to not restart anything at this time per Dr.Gherghe.

## 2016-03-20 NOTE — Discharge Instructions (Signed)
Stop taking lisinopril. Take prednisone, Pepcid, Benadryl as prescribed. Follow-up with your doctor closely, call and let them know that you are here today. See if they want to change her medications. Follow-up as needed. Return if worsening

## 2016-03-20 NOTE — Telephone Encounter (Signed)
Patient had an allergic reaction to her Lisinopril medication.  Half of her tongue was swollen.  So she needs something else called in for her.

## 2016-03-20 NOTE — Telephone Encounter (Signed)
Pt called back, gave her the message that you put in her chart. She understood with no further questions.

## 2016-03-20 NOTE — Telephone Encounter (Signed)
Sorry to hear that. Please stop lisinopril completely and to not restart anything for now.

## 2016-03-20 NOTE — ED Triage Notes (Signed)
Pt states she woke up and the left side of her tongue is swollen  Pt denies any difficulty breathing or swallowing at this time  Pt states it has not changed since she has been awake  Pt does take lisinopril

## 2016-03-30 ENCOUNTER — Encounter: Payer: Self-pay | Admitting: Internal Medicine

## 2016-04-04 ENCOUNTER — Ambulatory Visit: Payer: 59 | Admitting: Internal Medicine

## 2016-05-17 ENCOUNTER — Ambulatory Visit: Payer: 59 | Admitting: Internal Medicine

## 2016-05-21 ENCOUNTER — Other Ambulatory Visit: Payer: Self-pay | Admitting: Family

## 2016-05-29 ENCOUNTER — Encounter: Payer: Self-pay | Admitting: Family

## 2016-05-29 ENCOUNTER — Ambulatory Visit (INDEPENDENT_AMBULATORY_CARE_PROVIDER_SITE_OTHER): Payer: 59 | Admitting: Family

## 2016-05-29 VITALS — BP 130/70 | HR 75 | Temp 98.8°F | Resp 16 | Ht 65.0 in | Wt 334.4 lb

## 2016-05-29 DIAGNOSIS — I10 Essential (primary) hypertension: Secondary | ICD-10-CM | POA: Diagnosis not present

## 2016-05-29 DIAGNOSIS — F32A Depression, unspecified: Secondary | ICD-10-CM

## 2016-05-29 DIAGNOSIS — F329 Major depressive disorder, single episode, unspecified: Secondary | ICD-10-CM | POA: Diagnosis not present

## 2016-05-29 LAB — BASIC METABOLIC PANEL
BUN: 17 mg/dL (ref 6–23)
CALCIUM: 9.2 mg/dL (ref 8.4–10.5)
CO2: 26 mEq/L (ref 19–32)
Chloride: 101 mEq/L (ref 96–112)
Creatinine, Ser: 1.01 mg/dL (ref 0.40–1.20)
GFR: 76.39 mL/min (ref >60.00)
GLUCOSE: 130 mg/dL — AB (ref 70–99)
Potassium: 4.1 mEq/L (ref 3.5–5.1)
SODIUM: 136 meq/L (ref 135–145)

## 2016-05-29 MED ORDER — BUPROPION HCL ER (XL) 150 MG PO TB24: 150.0000 mg | ORAL_TABLET | Freq: Every day | ORAL | 0 refills | Status: DC

## 2016-05-29 NOTE — Patient Instructions (Signed)
Please complete lab work prior to leaving. Continue current medication.

## 2016-05-29 NOTE — Progress Notes (Signed)
Pre visit review using our clinic review tool, if applicable. No additional management support is needed unless otherwise documented below in the visit note. 

## 2016-05-29 NOTE — Assessment & Plan Note (Signed)
Stable on wellbutrin. Continue same.  

## 2016-05-29 NOTE — Assessment & Plan Note (Signed)
Stable on aldactone. Obtain bmet.

## 2016-05-29 NOTE — Progress Notes (Signed)
Subjective:    Patient ID: Audrey Goodman, female    DOB: 07-26-1970, 46 y.o.   MRN: [id number]  HPI  Audrey Goodman is a 46 yr old female who presents today for follow up of her depression. Last visit we changed her wellbutrin to extended release.  She reports that this transition was smooth. Reports good mood, sleeping well.    HTN- she had swelling secondary to lisinopril back in October.  Lisinopril was d/c'd. She is maintained on aldactone. She reports occasional LE edema and only rarely uses prn lasix.  BP Readings from Last 3 Encounters:  05/29/16 130/70  03/20/16 146/78  08/10/15 120/70     Review of Systems See HPI  Past Medical History:  Diagnosis Date  . Arthritis    left shoulder  . Depression   . Diabetes mellitus without complication (HCC)    type 2  . Diverticulitis   . High cholesterol   . History of chicken pox   . Hyperlipidemia   . Hypertension   . Shingles   . Sleep apnea    uses CPAP nightly  . SVD (spontaneous vaginal delivery) 1995   x 1  . Type 2 diabetes mellitus, uncontrolled (HCC) 07/30/2006   Qualifier: Diagnosis of  By: Lanier Prude  MD, Cathrine Muster       Social History   Social History  . Marital status: Single    Spouse name: N/A  . Number of children: N/A  . Years of education: N/A   Occupational History  . Not on file.   Social History Main Topics  . Smoking status: Never Smoker  . Smokeless tobacco: Never Used  . Alcohol use No  . Drug use: No  . Sexual activity: Yes    Birth control/ protection: IUD     Comment: Mirena   Other Topics Concern  . Not on file   Social History Narrative   Single- lives with boyfriend.   Has a boyfriend   Son 63- senior at [employer]   Enjoys reading   Works as a Advertising account planner at [employer]        Past Surgical History:  Procedure Laterality Date  . ANKLE FRACTURE SURGERY  1991   right  . CHOLECYSTECTOMY  2008  . HYSTEROSCOPY N/A 09/13/2014   Procedure: HYSTEROSCOPY with IUD removal;   Surgeon: Mitchel Honour, DO;  Location: [facility];  Service: Gynecology;  Laterality: N/A;  need longest speculum and instruments that aresavailable due to patient's habitus (BMI ~60)    Family History  Problem Relation Age of Onset  . Cancer Mother     breast  . Hyperlipidemia Mother   . Heart disease Mother   . Hypertension Mother   . Mental illness Mother     depression  . Diabetes Mother   . Hyperlipidemia Father   . Stroke Father   . Hypertension Father   . Cancer Maternal Grandmother     colon  . Diabetes Maternal Grandmother   . Diabetes Maternal Uncle     Allergies  Allergen Reactions  . Lisinopril Swelling    Swelling of the left side of tongue  . Lipitor [Atorvastatin] Other (See Comments)    Myalgia/foot cramping    Current Outpatient Prescriptions on File Prior to Visit  Medication Sig Dispense Refill  . buPROPion (WELLBUTRIN XL) 150 MG 24 hr tablet TAKE 1 TABLET BY MOUTH EVERY DAY 90 tablet 0  . diphenhydrAMINE (BENADRYL) 25 mg capsule Take 1 capsule (25 mg total) by  mouth every 6 (six) hours as needed. 30 capsule 0  . famotidine (PEPCID) 20 MG tablet Take 1 tablet (20 mg total) by mouth 2 (two) times daily. 30 tablet 0  . furosemide (LASIX) 40 MG tablet Take 40 mg by mouth at bedtime as needed for fluid or edema.     Marland Kitchen. glucose blood (ONE TOUCH ULTRA TEST) test strip USE TO TEST BLOOD SUGAR 3 TIMES DAILY AS INSTRUCTED 200 each 11  . ibuprofen (ADVIL,MOTRIN) 800 MG tablet Take 1 tablet (800 mg total) by mouth every 6 (six) hours as needed. (Patient taking differently: Take 800 mg by mouth every 6 (six) hours as needed for headache or moderate pain. ) 30 tablet 0  . insulin aspart (NOVOLOG FLEXPEN) 100 UNIT/ML FlexPen INJECT 28 TO 35 UNITS INTO THE SKIN 3 TIMES DAILY BEFORE MEALS. 35 mL 1  . Insulin Pen Needle (CAREFINE PEN NEEDLES) 32G X 4 MM MISC Use to inject insulin 4 times daily as instructed. 200 each 11  . LEVEMIR FLEXTOUCH 100 UNIT/ML Pen INJECT 50 UNITS INTO THE  SKIN DAILY AT 10 PM. 45 mL 1  . metFORMIN (GLUCOPHAGE) 850 MG tablet Take 1 tablet (850 mg total) by mouth 2 (two) times daily with a meal. 180 tablet 1  . norgestimate-ethinyl estradiol (ORTHO-CYCLEN,SPRINTEC,PREVIFEM) 0.25-35 MG-MCG tablet Take 1 tablet by mouth daily.    . predniSONE (DELTASONE) 10 MG tablet Take 2 tablets (20 mg total) by mouth daily. 15 tablet 0  . spironolactone (ALDACTONE) 50 MG tablet Take 1 tablet (50 mg total) by mouth 2 (two) times daily. 180 tablet 0   No current facility-administered medications on file prior to visit.     BP 130/70 (BP Location: Left Arm) Comment (Cuff Size): thigh  Pulse 75   Temp 98.8 F (37.1 C) (Oral)   Resp 16   Ht 5\' 5"  (1.651 m)   Wt (!) 334 lb 6.4 oz (151.7 kg)   SpO2 98% Comment: room air  BMI 55.65 kg/m       Objective:   Physical Exam  Constitutional: She is oriented to person, place, and time. She appears well-developed and well-nourished.  HENT:  Head: Normocephalic and atraumatic.  Cardiovascular: Normal rate, regular rhythm and normal heart sounds.   No murmur heard. Pulmonary/Chest: Effort normal and breath sounds normal. No respiratory distress. She has no wheezes.  Musculoskeletal: She exhibits no edema.  Neurological: She is alert and oriented to person, place, and time.  Skin: Skin is warm and dry.  Psychiatric: She has a normal mood and affect. Her behavior is normal. Judgment and thought content normal.          Assessment & Plan:

## 2016-06-12 ENCOUNTER — Ambulatory Visit: Payer: 59 | Admitting: Internal Medicine

## 2016-06-15 ENCOUNTER — Ambulatory Visit: Payer: 59 | Admitting: Internal Medicine

## 2016-06-18 ENCOUNTER — Encounter: Payer: Self-pay | Admitting: Internal Medicine

## 2016-06-18 ENCOUNTER — Ambulatory Visit (INDEPENDENT_AMBULATORY_CARE_PROVIDER_SITE_OTHER): Payer: 59 | Admitting: Internal Medicine

## 2016-06-18 VITALS — BP 128/84 | HR 84 | Wt 331.0 lb

## 2016-06-18 DIAGNOSIS — E1165 Type 2 diabetes mellitus with hyperglycemia: Secondary | ICD-10-CM | POA: Diagnosis not present

## 2016-06-18 DIAGNOSIS — E282 Polycystic ovarian syndrome: Secondary | ICD-10-CM

## 2016-06-18 LAB — LIPID PANEL
CHOL/HDL RATIO: 3
Cholesterol: 225 mg/dL — ABNORMAL HIGH (ref 0–200)
HDL: 68.3 mg/dL (ref >39.00)
LDL Cholesterol: 124 mg/dL — ABNORMAL HIGH (ref 0–99)
NONHDL: 156.8
Triglycerides: 164 mg/dL — ABNORMAL HIGH (ref 0.0–149.0)
VLDL: 32.8 mg/dL (ref 0.0–40.0)

## 2016-06-18 LAB — POCT GLYCOSYLATED HEMOGLOBIN (HGB A1C): Hemoglobin A1C: 8.1

## 2016-06-18 MED ORDER — GLUCOSE BLOOD VI STRP: ORAL_STRIP | 11 refills | Status: DC

## 2016-06-18 MED ORDER — INSULIN PEN NEEDLE 32G X 4 MM MISC: 11 refills | Status: DC

## 2016-06-18 MED ORDER — DULAGLUTIDE 0.75 MG/0.5ML ~~LOC~~ SOAJ: SUBCUTANEOUS | 1 refills | Status: DC

## 2016-06-18 NOTE — Patient Instructions (Addendum)
Check with your insurance which of the following CGMs are covered: - Dexcom - FreeStyle Libre   Please start Trulicity 0.75 mg in am once a week. Let me know in ~3 weeks if I need to call in the 1.5 mg dose.  Please return in 1.5 months with your sugar log.

## 2016-06-18 NOTE — Progress Notes (Addendum)
Patient ID: Audrey CruelLena C Simmer, female   DOB: 03-05-1971, 46 y.o.   MRN: [id number]  HPI: Audrey Goodman is a 46 y.o.-year-old female, returning for f/u for DM2, dx with GDM in 1995, then DM 6 mo after son was born, insulin-dependent since ~2010, uncontrolled, without complications and PCOS. Last visit 10 mo ago!  She has depression >> she is noncompliant with her DM meds.  Changed antidepressant >> Wellbutrin XL.   Last hemoglobin A1c was: Lab Results  Component Value Date   HGBA1C 7.9 07/27/2015   HGBA1C 10.2 (H) 06/22/2014   HGBA1C 8.6 (H) 03/22/2014   Pt is on a regimen of: - Metformin 850 mg 2x a day  - Levemir 50 units in am  - Novolog insulin with meals as follows: (takes it after a meal!) (takes 2/3 doses a day!) - smaller meal: 20 units  - large meal: 30 units  (- NovoLog SSI:  - 150- 165: + 2 unit  - 166- 180: + 3 units  - 181- 195: + 4 units  - 196- 210: + 5 units  - 211- 225: + 6 units  - 226- 240: + 7 units  - 241- 255: + 8 units  - >255: + 9 units)  Pt checks her sugars 0-1x a day: - am: 120-200 >> 90-150 >> 100-130 (uses 23 units  - shake) >> 120s >> 134-200 (when forgets insulin) - 2h after b'fast: n/c - before lunch: (90) 120-225 >> 150-160 >> 150-170 >> n/c - 2h after lunch: n/c >> 113 >> n/c - before dinner: 120-220 >> 150s (rarely 200) >> 140-160 >> n/c - 2h after dinner: n/c - bedtime: n/c >> 150s >> n/c - nighttime: n/c No more lows at night. Lowest sugar was 80 >> 59 (on the diet, 1 year ago) >> 69 (lunchtime); she has hypoglycemia awareness at 80.  Highest sugar was 170 >> 160 >> n/c >> 230.  Still drinking regular sodas.  - No CKD, last BUN/creatinine:  Lab Results  Component Value Date   BUN 17 05/29/2016   CREATININE 1.01 05/29/2016  Stopped Lisinopril. - last set of lipids: Lab Results  Component Value Date   CHOL 249 (H) 07/13/2015   HDL 65.40 07/13/2015   LDLCALC 150 (H) 07/13/2015   TRIG 171.0 (H) 07/13/2015   CHOLHDL 4 07/13/2015   Off Zetia, Crestor 40 - stopped mo ago b/c cramps. - last eye exam was: Abstract on 07/14/2015  Component Date Value Ref Range Status  . HM Diabetic Eye Exam 06/29/2015 No Retinopathy  No Retinopathy Final   - no numbness and tingling in her feet.  No GBP as she could not afford it. Her insurance may cover it this year.  PCOS: - has had irregular menses since menarche - had 1 pregnancy, no miscarriages - was on Mirena IUD - for ~4.5 years >> OCP (Sprintec) in 2016 - On Spironolactone 50 mg bid  - tolerates it well. - + significant hirsutism >> much improved on Spironolactone - on Metformin 850 mg bid Last K normal: Lab Results  Component Value Date   K 4.1 05/29/2016   ROS: Constitutional: no weight gain/loss, no fatigue, no subjective hyperthermia/hypothermia Eyes: no blurry vision, no xerophthalmia ENT: no sore throat, no nodules palpated in throat, no dysphagia/odynophagia, no hoarseness Cardiovascular: no CP/SOB/palpitations/leg swelling Respiratory: no cough/SOB Gastrointestinal: no N/V/D/C Musculoskeletal: no muscle/joint aches Skin: no rashes Neurological: no tremors/numbness/tingling/dizziness  PE: BP 128/84 (BP Location: Right Arm, Patient Position: Sitting)  Pulse 84   Wt (!) 331 lb (150.1 kg)   SpO2 98%   BMI 55.08 kg/m  Wt Readings from Last 3 Encounters:  06/18/16 (!) 331 lb (150.1 kg)  05/29/16 (!) 334 lb 6.4 oz (151.7 kg)  08/10/15 (!) 336 lb 3.2 oz (152.5 kg)   Constitutional: obese, in NAD Eyes: PERRLA, EOMI, no exophthalmos ENT: moist mucous membranes, no thyromegaly, no cervical lymphadenopathy Cardiovascular: RRR, No MRG Respiratory: CTA B Gastrointestinal: abdomen soft, NT, ND, BS+ Musculoskeletal: no deformities, strength intact in all 4 Skin: moist, warm,  +++ hirsutism on chin and chest; no acne Neurological: no tremor with outstretched hands, DTR normal in all 4  ASSESSMENT: 1. DM2, insulin-dependent, uncontrolled, without  complications  2. PCOS Component     Latest Ref Rng 10/16/2013  Testosterone     10 - 70 ng/dL 54  Sex Hormone Binding     18 - 114 nmol/L 12 (L)  Testosterone Free     0.6 - 6.8 pg/mL 15.5 (H)  Testosterone-% Free     0.4 - 2.4 % 2.9 (H)  TSH     0.35 - 4.50 uIU/mL 0.54  17-OH-Progesterone, LC/MS/MS      <8  Free T4     0.60 - 1.60 ng/dL 1.30  T3, Free     2.3 - 4.2 pg/mL 3.0   Labs confirmed PCOS (high testosterone, low 17-HO Prog, normal TFTs). We could not test LH and FSH since on Mirena.  PLAN:  1. DM2 Patient with long-standing, uncontrolled diabetes, on basal-bolus insulin regimen, with improved sugars in last year. However, she tells me she is noncompliant with her med regimen >> improving. Will move Novolog to before meals and try to add Trulicity. - she is interested in a CGM >> will check with her insurance if covered. -  I suggested to:  Patient Instructions  Check with your insurance which of the following CGMs are covered: - Dexcom - FreeStyle Libre   Please start Trulicity 0.75 mg in am once a week. Let me know in ~3 weeks if I need to call in the 1.5 mg dose.  Please return in 1.5 months with your sugar log.   - start checking sugars at different times of the day - check 3 times a day, rotating checks - advised for yearly eye exams, she is up to date - will check a Lipid level but I suspect this is uncontrolled as she is off her Crestor and Zetia 2/2 cramps - checked HbA1c today >> 8.1% (higher) - Return to clinic in 3 mo with sugar log   2. PCOS - pt with clinically evident PCOS, confirmed biochemically in 2015 - she had the IUD removed >> started OCP's (Orthocyclen) in 2016 - continue Metformin 850 mg 2x a day - continue Spironolactone 50 mg bid (started 09/2013)  Office Visit on 06/18/2016  Component Date Value Ref Range Status  . Hemoglobin A1C 06/18/2016 8.1   Final  . Cholesterol 06/18/2016 225* 0 - 200 mg/dL Final  . Triglycerides 06/18/2016  164.0* 0.0 - 149.0 mg/dL Final  . HDL 86/57/8469 68.30  >39.00 mg/dL Final  . VLDL 62/95/2841 32.8  0.0 - 40.0 mg/dL Final  . LDL Cholesterol 06/18/2016 124* 0 - 99 mg/dL Final  . Total CHOL/HDL Ratio 06/18/2016 3   Final  . NonHDL 06/18/2016 156.80   Final   Message sent: Dear Ms. Audrey Goodman, Your cholesterol level has improved, but it is still high. I would still suggest to  restart your Crestor, possibly at a lower dose of 5 or 10 mg daily. Please let me know what you think. Sincerely, Carlus Pavlov MD  Carlus Pavlov, MD PhD [facility] End[facility]

## 2016-06-19 ENCOUNTER — Other Ambulatory Visit: Payer: Self-pay

## 2016-06-19 MED ORDER — ROSUVASTATIN CALCIUM 5 MG PO TABS: 5.0000 mg | ORAL_TABLET | Freq: Every day | ORAL | 5 refills | Status: DC

## 2016-06-21 ENCOUNTER — Other Ambulatory Visit: Payer: Self-pay

## 2016-06-21 MED ORDER — DULAGLUTIDE 0.75 MG/0.5ML ~~LOC~~ SOAJ: SUBCUTANEOUS | 1 refills | Status: DC

## 2016-07-05 LAB — HM DIABETES EYE EXAM

## 2016-07-06 ENCOUNTER — Other Ambulatory Visit: Payer: Self-pay

## 2016-07-06 MED ORDER — INSULIN PEN NEEDLE 32G X 4 MM MISC: 11 refills | Status: DC

## 2016-07-08 ENCOUNTER — Other Ambulatory Visit: Payer: Self-pay | Admitting: Internal Medicine

## 2016-07-13 ENCOUNTER — Other Ambulatory Visit: Payer: Self-pay

## 2016-07-13 MED ORDER — GLUCOSE BLOOD VI STRP: ORAL_STRIP | 11 refills | Status: DC

## 2016-07-13 MED ORDER — INSULIN PEN NEEDLE 32G X 4 MM MISC: 3 refills | Status: DC

## 2016-07-30 ENCOUNTER — Encounter: Payer: Self-pay | Admitting: Internal Medicine

## 2016-07-30 ENCOUNTER — Ambulatory Visit (INDEPENDENT_AMBULATORY_CARE_PROVIDER_SITE_OTHER): Payer: 59 | Admitting: Internal Medicine

## 2016-07-30 VITALS — BP 112/82 | HR 100 | Ht 66.0 in | Wt 320.0 lb

## 2016-07-30 DIAGNOSIS — E282 Polycystic ovarian syndrome: Secondary | ICD-10-CM

## 2016-07-30 DIAGNOSIS — E1165 Type 2 diabetes mellitus with hyperglycemia: Secondary | ICD-10-CM | POA: Diagnosis not present

## 2016-07-30 MED ORDER — DULAGLUTIDE 1.5 MG/0.5ML ~~LOC~~ SOAJ: SUBCUTANEOUS | 3 refills | Status: DC

## 2016-07-30 NOTE — Patient Instructions (Addendum)
Please continue: - Metformin 850 mg 2x a day  - Levemir 50 units in am   Please decrease: - Novolog insulin with meals as follows: - smaller meal: 15 units  - large meal: 25 units   Please continue: - NovoLog SSI:  - 150- 165: + 2 unit  - 166- 180: + 3 units  - 181- 195: + 4 units  - 196- 210: + 5 units  - 211- 225: + 6 units  - 226- 240: + 7 units  - 241- 255: + 8 units  - >255: + 9 units  Please increase Trulicity to 1.5 mg in am once a week.  Please return in 1.5 months with your sugar log.

## 2016-07-30 NOTE — Progress Notes (Signed)
Patient ID: ENGLAND GREB, female   DOB: 1971-02-06, 46 y.o.   MRN: [id number]  HPI: Audrey Goodman is a 46 y.o.-year-old female, returning for f/u for DM2, dx with GDM in 1995, then DM 6 mo after son was born, insulin-dependent since ~2010, uncontrolled, without complications and PCOS. Last visit 1.5 mo ago.  Lost 14 lbs in 2 months!  Last hemoglobin A1c was: Lab Results  Component Value Date   HGBA1C 8.1 06/18/2016   HGBA1C 7.9 07/27/2015   HGBA1C 10.2 (H) 06/22/2014   Pt is on a regimen of: - Metformin 850 mg 2x a day  - Levemir 50 units in am  - Novolog insulin with meals as follows: - smaller meal: 20 units  - large meal: 30 units  - NovoLog SSI:  - 150- 165: + 2 unit  - 166- 180: + 3 units  - 181- 195: + 4 units  - 196- 210: + 5 units  - 211- 225: + 6 units  - 226- 240: + 7 units  - 241- 255: + 8 units  - >255: + 9 units - Trulicity 0.75 mg weekly  - started 05/2016.  Pt checks her sugars 0-1x a day: - am: 100-130 (uses 23 units  - shake) >> 120s >> 134-200 (when forgets insulin) >> 120-140,184 - 2h after b'fast: n/c - before lunch: (90) 120-225 >> 150-160 >> 150-170 >> n/c >> 79 x1 - 2h after lunch: n/c >> 113 >> n/c - before dinner: 120-220 >> 150s (rarely 200) >> 140-160 >> n/c >> 130s - 2h after dinner: n/c - bedtime: n/c >> 150s >> n/c - nighttime: n/c No more lows at night. Lowest sugar was 80 >> 59 (on the diet, 1 year ago) >> 69 (lunchtime) >> 79; she has hypoglycemia awareness at 80.  Highest sugar was 170 >> 160 >> n/c >> 230 >> 273 (missed insulin).  She is drinking regular sodas >> now on diet sodas.  - No CKD, last BUN/creatinine:  Lab Results  Component Value Date   BUN 17 05/29/2016   CREATININE 1.01 05/29/2016  Stopped Lisinopril. - last set of lipids: Lab Results  Component Value Date   CHOL 225 (H) 06/18/2016   HDL 68.30 06/18/2016   LDLCALC 124 (H) 06/18/2016   TRIG 164.0 (H) 06/18/2016   CHOLHDL 3 06/18/2016  Off Zetia, Crestor 40 -  stopped b/c cramps. Restarted at 5 mg in 05/2016. - last eye exam was: 05/2016 >> No DR. Dr. Dione Booze. - no numbness and tingling in her feet.  No GBP as she could not afford it. Her insurance may cover it this year.  PCOS: - has had irregular menses since menarche - had 1 pregnancy, no miscarriages - was on Mirena IUD - for ~4.5 years >> OCP (Sprintec) in 2016 - On Spironolactone 50 mg bid  - tolerates it well. - + significant hirsutism >> better on Spironolactone - on Metformin 850 mg bid Last K normal: Lab Results  Component Value Date   K 4.1 05/29/2016   ROS: Constitutional: + weight loss, no fatigue, no subjective hyperthermia/hypothermia Eyes: no blurry vision, no xerophthalmia ENT: no sore throat, no nodules palpated in throat, no dysphagia/odynophagia, no hoarseness Cardiovascular: no CP/SOB/palpitations/leg swelling Respiratory: no cough/SOB Gastrointestinal: no N/V/D/C Musculoskeletal: no muscle/joint aches Skin: no rashes Neurological: no tremors/numbness/tingling/dizziness  PE: BP 112/82 (BP Location: Left Arm, Patient Position: Sitting)   Pulse 100   Ht 5\' 6"  (1.676 m)   Wt (!) 320 lb (  145.2 kg)   SpO2 98%   BMI 51.65 kg/m  Wt Readings from Last 3 Encounters:  07/30/16 (!) 320 lb (145.2 kg)  06/18/16 (!) 331 lb (150.1 kg)  05/29/16 (!) 334 lb 6.4 oz (151.7 kg)   Constitutional: obese, in NAD Eyes: PERRLA, EOMI, no exophthalmos ENT: moist mucous membranes, no thyromegaly, no cervical lymphadenopathy Cardiovascular: tachycardia, RR, No MRG Respiratory: CTA B Gastrointestinal: abdomen soft, NT, ND, BS+ Musculoskeletal: no deformities, strength intact in all 4 Skin: moist, warm,  ++ hirsutism on chin and chest; no acne Neurological: no tremor with outstretched hands, DTR normal in all 4  ASSESSMENT: 1. DM2, insulin-dependent, uncontrolled, without complications  2. PCOS Component     Latest Ref Rng 10/16/2013  Testosterone     10 - 70 ng/dL 54  Sex  Hormone Binding     18 - 114 nmol/L 12 (L)  Testosterone Free     0.6 - 6.8 pg/mL 15.5 (H)  Testosterone-% Free     0.4 - 2.4 % 2.9 (H)  TSH     0.35 - 4.50 uIU/mL 0.54  17-OH-Progesterone, LC/MS/MS      <8  Free T4     0.60 - 1.60 ng/dL 1.610.84  T3, Free     2.3 - 4.2 pg/mL 3.0   Labs confirmed PCOS (high testosterone, low 17-HO Prog, normal TFTs). We could not test LH and FSH since on Mirena.  PLAN:  1. DM2 Patient with long-standing, uncontrolled diabetes, on basal-bolus insulin regimen, Metformin and now Trulicity, with good improvement in CBGs after adding the GLP1 R agonist. She is still taking the low dose Trulicity, w/o SEs >> will increase to 1.5 mg weekly and decrease the mealtme insulin. - she is interested in a CGM >> would not want to start for now -  I suggested to:  Patient Instructions  Please continue: - Metformin 850 mg 2x a day  - Levemir 50 units in am   Please decrease: - Novolog insulin with meals as follows: - smaller meal: 15 units  - large meal: 25 units   Please continue: - NovoLog SSI:  - 150- 165: + 2 unit  - 166- 180: + 3 units  - 181- 195: + 4 units  - 196- 210: + 5 units  - 211- 225: + 6 units  - 226- 240: + 7 units  - 241- 255: + 8 units  - >255: + 9 units  Please increase Trulicity to 1.5 mg in am once a week.  Please return in 1.5 months with your sugar log.   - start checking sugars at different times of the day - check 3 times a day, rotating checks - advised for yearly eye exams, she is up to date - for her HL >> continue Crestor 5 mg daily - will check HbA1c at next visit - Return to clinic in 1.5 mo with sugar log   2. PCOS - pt with clinically evident PCOS, confirmed biochemically in 2015 - she had the IUD removed >> on OCP's (Orthocyclen) since 2016. No SEs. - continue Metformin 850 mg 2x a day - continue Spironolactone 50 mg bid (started 09/2013) - latest K reviewed >> normal, in 05/2016  Audrey Pavlovristina Halston Kintz, MD  PhD [facility]

## 2016-07-31 ENCOUNTER — Other Ambulatory Visit: Payer: Self-pay

## 2016-07-31 ENCOUNTER — Encounter: Payer: Self-pay | Admitting: Family

## 2016-07-31 ENCOUNTER — Encounter: Payer: Self-pay | Admitting: Internal Medicine

## 2016-07-31 MED ORDER — DULAGLUTIDE 1.5 MG/0.5ML ~~LOC~~ SOAJ: SUBCUTANEOUS | 0 refills | Status: DC

## 2016-07-31 MED ORDER — DULAGLUTIDE 1.5 MG/0.5ML ~~LOC~~ SOAJ: SUBCUTANEOUS | 3 refills | Status: DC

## 2016-08-10 ENCOUNTER — Other Ambulatory Visit: Payer: Self-pay

## 2016-08-10 MED ORDER — ROSUVASTATIN CALCIUM 5 MG PO TABS
5.0000 mg | ORAL_TABLET | Freq: Every day | ORAL | 1 refills | Status: DC
Start: 2016-08-10 — End: 2017-03-12

## 2016-08-23 ENCOUNTER — Other Ambulatory Visit: Payer: Self-pay | Admitting: Internal Medicine

## 2016-08-26 LAB — HM PAP SMEAR: HM PAP: NORMAL

## 2016-08-26 LAB — HM MAMMOGRAPHY: HM MAMMO: NORMAL (ref 0–4)

## 2016-08-29 ENCOUNTER — Encounter: Payer: 59 | Admitting: Family

## 2016-09-12 ENCOUNTER — Encounter: Payer: 59 | Admitting: Family

## 2016-09-27 ENCOUNTER — Ambulatory Visit: Payer: 59 | Admitting: Internal Medicine

## 2016-10-06 ENCOUNTER — Other Ambulatory Visit: Payer: Self-pay | Admitting: Internal Medicine

## 2016-11-30 ENCOUNTER — Other Ambulatory Visit: Payer: Self-pay | Admitting: Internal Medicine

## 2016-12-03 ENCOUNTER — Telehealth: Payer: Self-pay | Admitting: *Deleted

## 2016-12-03 MED ORDER — BUPROPION HCL ER (XL) 150 MG PO TB24: 150.0000 mg | ORAL_TABLET | Freq: Every day | ORAL | 0 refills | Status: DC

## 2016-12-03 NOTE — Telephone Encounter (Signed)
Faxed refill request received from [facility] for Bupropion HCL XL 150 mg tab Last filled by MD on 05/29/16, #[id number] Last AEX - 05/29/16 Next AEX - 4-Mths. [pt has appointment scheduled for 01/14/17] Please Advise on refills/SLS 07/09

## 2017-01-14 ENCOUNTER — Encounter: Payer: 59 | Admitting: Family

## 2017-01-31 ENCOUNTER — Other Ambulatory Visit: Payer: Self-pay | Admitting: Family

## 2017-01-31 IMAGING — MG MM SCREEN MAMMOGRAM BILATERAL
7 series · 7 of 7 positions shown · non-contrast
Comparison: Previous exam(s).

CLINICAL DATA: Screening.

EXAM:
DIGITAL SCREENING BILATERAL MAMMOGRAM WITH CAD

[R CC]
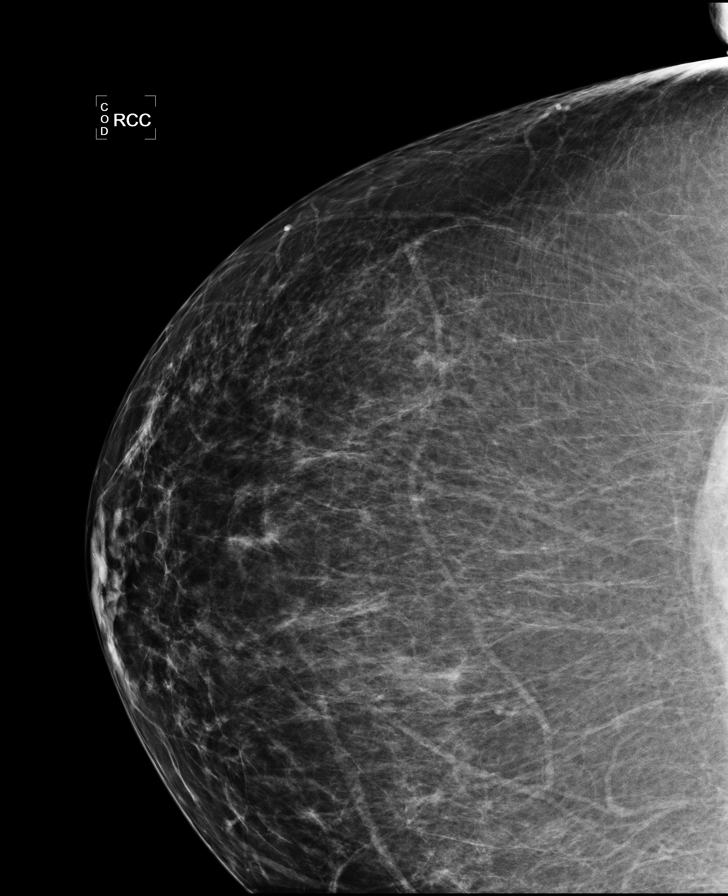

[L CC]
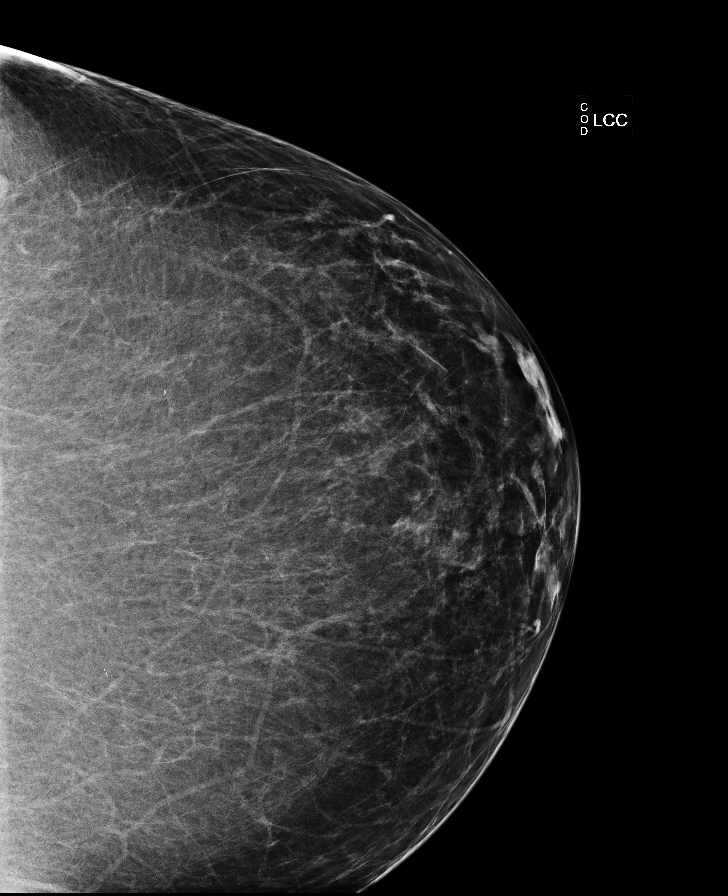

[L MLO (1 of 2)]
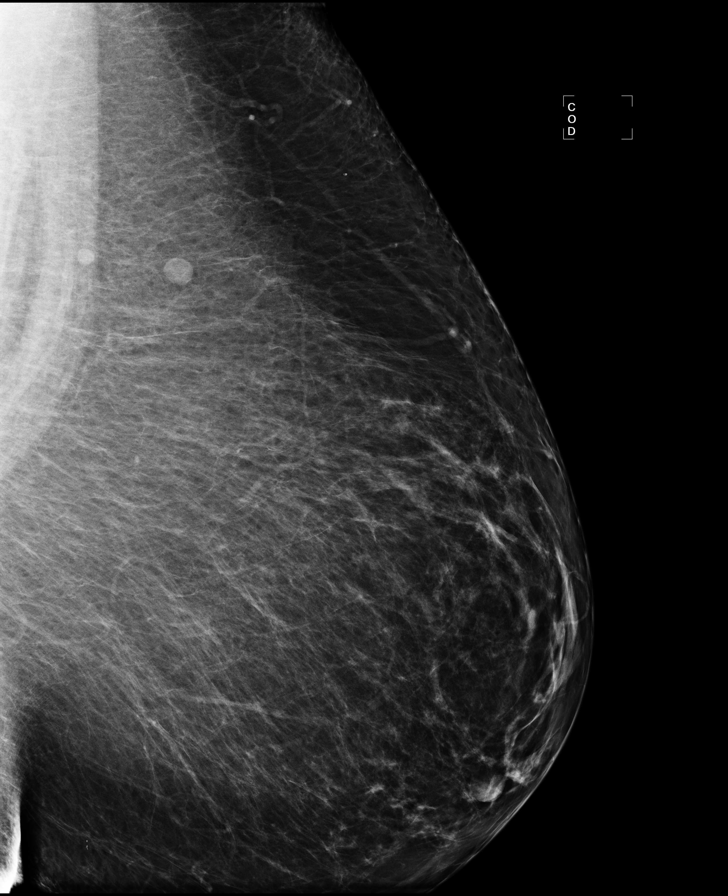

[R MLO (1 of 2)]
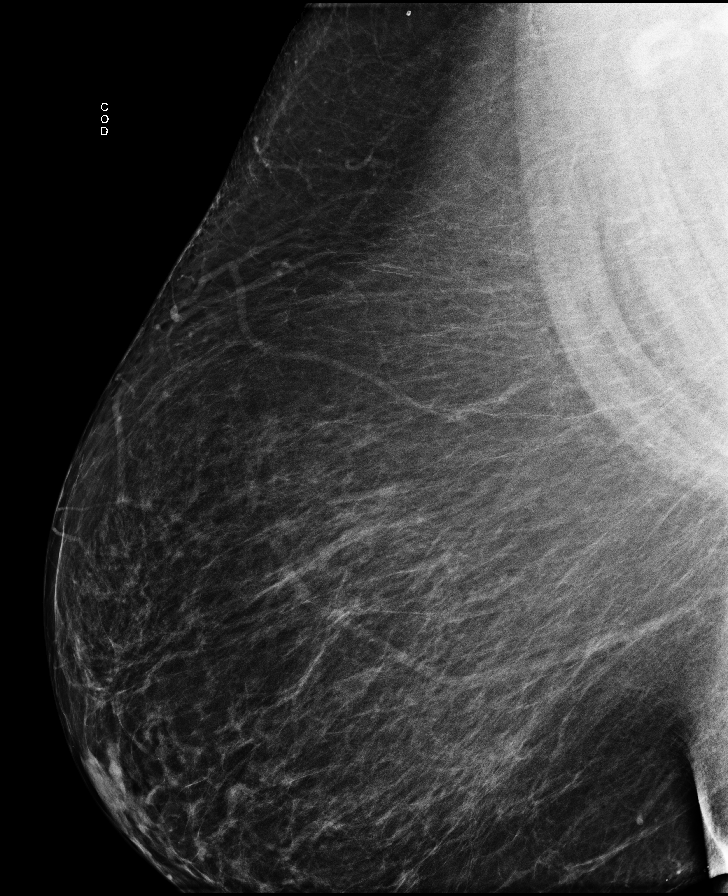

[R CV]
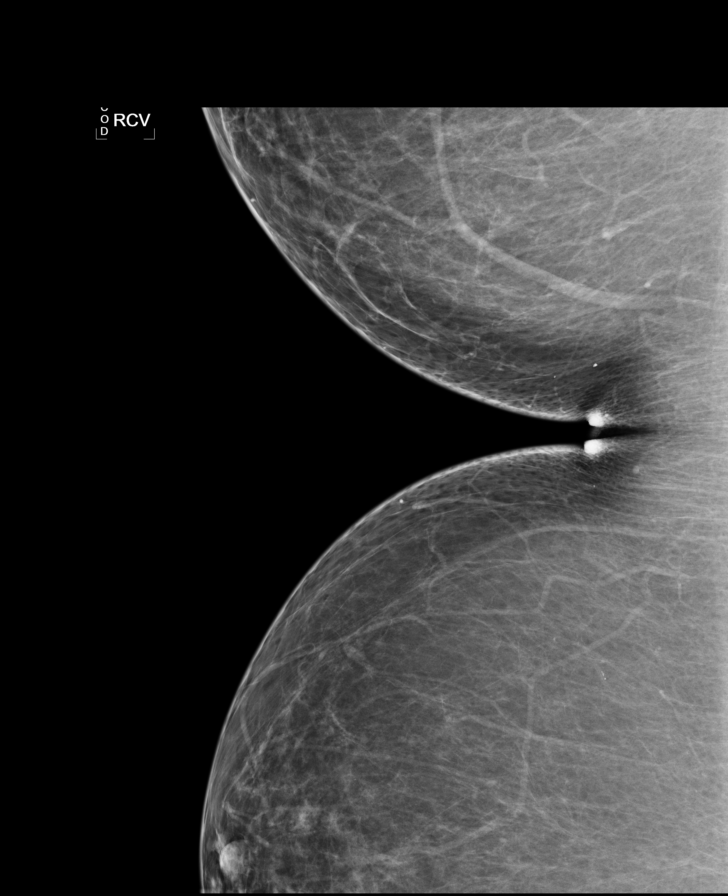

[L MLO (2 of 2)]
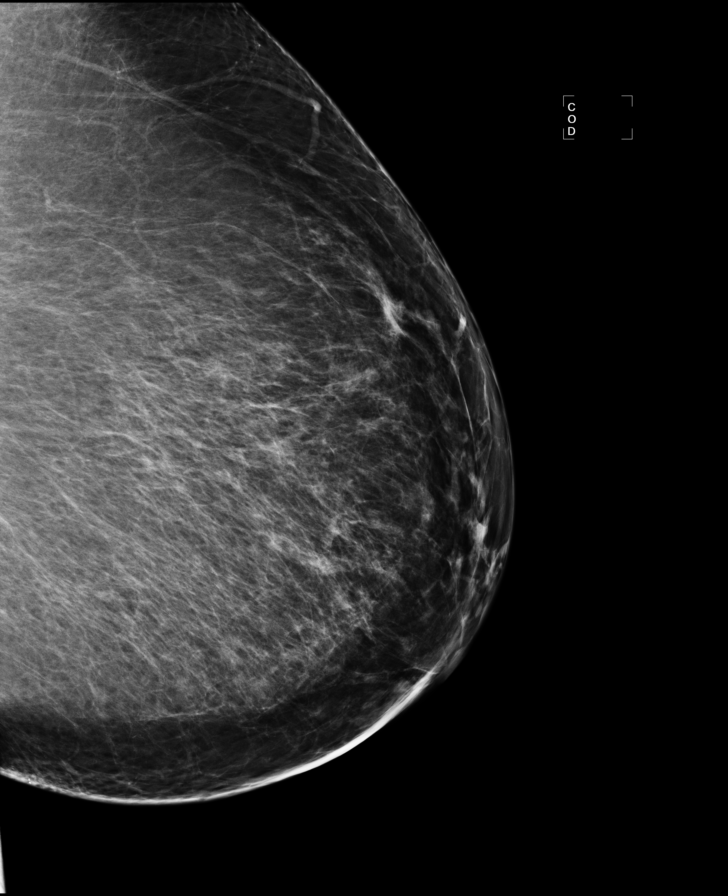

[R MLO (2 of 2)]
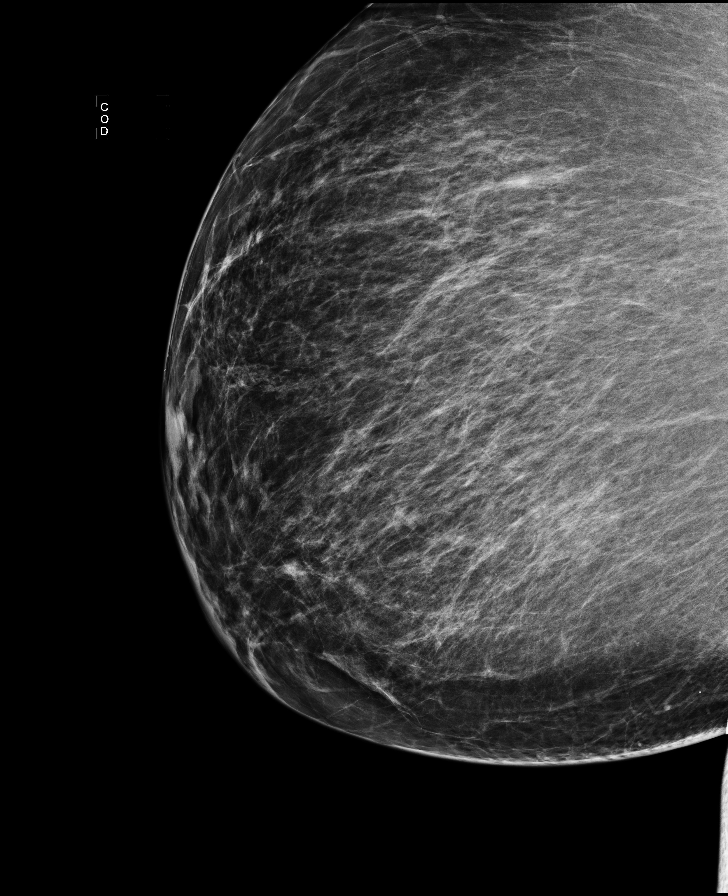

[7 of 7 positions shown; findings below may reference images not displayed]

ACR Breast Density Category b: There are scattered areas of
fibroglandular density.
FINDINGS: There are no findings suspicious for malignancy. Images were
processed with CAD.
IMPRESSION: No mammographic evidence of malignancy. A result letter of this
screening mammogram will be mailed directly to the patient.

RECOMMENDATION:
Screening mammogram in one year. (Code:AS-G-LCT)

BI-RADS CATEGORY  1: Negative.

## 2017-02-27 ENCOUNTER — Other Ambulatory Visit: Payer: Self-pay | Admitting: Family

## 2017-02-27 MED ORDER — BUPROPION HCL ER (XL) 150 MG PO TB24: 150.0000 mg | ORAL_TABLET | Freq: Every day | ORAL | 0 refills | Status: DC

## 2017-03-08 ENCOUNTER — Ambulatory Visit (INDEPENDENT_AMBULATORY_CARE_PROVIDER_SITE_OTHER): Payer: 59 | Admitting: Family

## 2017-03-08 ENCOUNTER — Encounter: Payer: Self-pay | Admitting: Family

## 2017-03-08 VITALS — BP 138/70 | HR 90 | Temp 99.4°F | Resp 16 | Ht 65.0 in | Wt 329.4 lb

## 2017-03-08 DIAGNOSIS — Z23 Encounter for immunization: Secondary | ICD-10-CM | POA: Diagnosis not present

## 2017-03-08 DIAGNOSIS — L03115 Cellulitis of right lower limb: Secondary | ICD-10-CM | POA: Diagnosis not present

## 2017-03-08 DIAGNOSIS — Z0000 Encounter for general adult medical examination without abnormal findings: Secondary | ICD-10-CM

## 2017-03-08 MED ORDER — CEPHALEXIN 500 MG PO CAPS: 500.0000 mg | ORAL_CAPSULE | Freq: Three times a day (TID) | ORAL | 0 refills | Status: DC

## 2017-03-08 MED ORDER — BUPROPION HCL ER (XL) 150 MG PO TB24: 150.0000 mg | ORAL_TABLET | Freq: Every day | ORAL | 1 refills | Status: DC

## 2017-03-08 NOTE — Progress Notes (Addendum)
Subjective:    Patient ID: Audrey Goodman, female    DOB: [birth year], 46 y.o.   MRN: [id number]  HPI  Audrey Goodman is a 46 yr old female who presents today for complete physical.  Immunizations: tetanus up to date Diet: needs improvement, eats fast food.   Wt Readings from Last 3 Encounters:  03/08/17 (!) 329 lb 6.4 oz (149.4 kg)  07/30/16 (!) 320 lb (145.2 kg)  06/18/16 (!) 331 lb (150.1 kg)  Exercise: not exercising.    Pap Smear: 08/26/16 Mammogram: 08/26/16 Dental: up to date   Review of Systems  Constitutional: Negative for unexpected weight change.  HENT: Negative for rhinorrhea.   Respiratory: Negative for cough.   Cardiovascular: Negative for leg swelling.  Gastrointestinal: Negative for constipation.       Some diarrhea since cholecystectomy  Genitourinary: Negative for dysuria and frequency.  Musculoskeletal: Negative for myalgias.       Some bursitis in her shoulders  Skin: Negative for rash.  Neurological:       Rare headaches  Hematological: Negative for adenopathy.  Psychiatric/Behavioral:       Denies depression/anxiety   Past Medical History:  Diagnosis Date  . Arthritis    left shoulder  . Depression   . Diabetes mellitus without complication (HCC)    type 2  . Diverticulitis   . High cholesterol   . History of chicken pox   . Hyperlipidemia   . Hypertension   . Shingles   . Sleep apnea    uses CPAP nightly  . SVD (spontaneous vaginal delivery) 1995   x 1  . Type 2 diabetes mellitus, uncontrolled (HCC) 07/30/2006   Qualifier: Diagnosis of  By: Lanier Prude  MD, Cathrine Muster       Social History   Social History  . Marital status: Single    Spouse name: N/A  . Number of children: N/A  . Years of education: N/A   Occupational History  . Not on file.   Social History Main Topics  . Smoking status: Never Smoker  . Smokeless tobacco: Never Used  . Alcohol use No  . Drug use: No  . Sexual activity: Yes    Birth control/ protection: IUD   Comment: Mirena   Other Topics Concern  . Not on file   Social History Narrative   Single- lives with boyfriend.   Has a boyfriend   Son 56- senior at [employer]   Enjoys reading   Works as a Advertising account planner at [employer]        Past Surgical History:  Procedure Laterality Date  . ANKLE FRACTURE SURGERY  1991   right  . CHOLECYSTECTOMY  2008  . HYSTEROSCOPY N/A 09/13/2014   Procedure: HYSTEROSCOPY with IUD removal;  Surgeon: Mitchel Honour, DO;  Location: [facility];  Service: Gynecology;  Laterality: N/A;  need longest speculum and instruments that aresavailable due to patient's habitus (BMI ~60)    Family History  Problem Relation Age of Onset  . Cancer Mother        breast  . Hyperlipidemia Mother   . Heart disease Mother   . Hypertension Mother   . Mental illness Mother        depression  . Diabetes Mother   . Hyperlipidemia Father   . Stroke Father   . Hypertension Father   . Cancer Maternal Grandmother        colon  . Diabetes Maternal Grandmother   . Diabetes Maternal Uncle   .  Diabetes Maternal Aunt   . Diabetes Maternal Uncle     Allergies  Allergen Reactions  . Lisinopril Swelling    Swelling of the left side of tongue  . Lipitor [Atorvastatin] Other (See Comments)    Myalgia/foot cramping    Current Outpatient Prescriptions on File Prior to Visit  Medication Sig Dispense Refill  . Dulaglutide (TRULICITY) 1.5 MG/0.5ML SOPN Inject 0.52ml  weekly under skin 12 pen 0  . furosemide (LASIX) 40 MG tablet Take 40 mg by mouth at bedtime as needed for fluid or edema.     Marland Kitchen glucose blood (ONE TOUCH ULTRA TEST) test strip USE TO TEST BLOOD SUGAR 3 TIMES DAILY AS INSTRUCTED 300 each 11  . insulin aspart (NOVOLOG FLEXPEN) 100 UNIT/ML FlexPen INJECT 28 TO 35 UNITS INTO THE SKIN 3 TIMES DAILY BEFORE MEALS. 35 mL 1  . Insulin Pen Needle 32G X 4 MM MISC Use to inject insulin 4 times daily 400 each 3  . LEVEMIR FLEXTOUCH 100 UNIT/ML Pen INJECT 50 UNITS SUBCUTANEOUSLY DAILY AT  10PM 45 mL 2  . metFORMIN (GLUCOPHAGE) 850 MG tablet Take 1 tablet (850 mg total) by mouth 2 (two) times daily with a meal. 180 tablet 1  . norgestimate-ethinyl estradiol (ORTHO-CYCLEN,SPRINTEC,PREVIFEM) 0.25-35 MG-MCG tablet Take 1 tablet by mouth daily.    Marland Kitchen NOVOLOG FLEXPEN 100 UNIT/ML FlexPen INJECT 28 TO 35 UNITS INTO THE SKIN 3 TIMES DAILY BEFORE MEALS. 105 mL 1  . rosuvastatin (CRESTOR) 5 MG tablet Take 1 tablet (5 mg total) by mouth daily. 90 tablet 1  . spironolactone (ALDACTONE) 50 MG tablet TAKE 1 TABLET (50 MG TOTAL) BY MOUTH 2 (TWO) TIMES DAILY. 180 tablet 0   No current facility-administered medications on file prior to visit.     BP 138/70 (BP Location: Right Arm) Comment (Cuff Size): thigh  Pulse 90   Temp 99.4 F (37.4 C) (Oral)   Resp 16   Ht  (1.651 m)   Wt (!) 329 lb 6.4 oz (149.4 kg)   SpO2 100%   BMI 54.82 kg/m       Objective:   Physical Exam  Physical Exam  Constitutional: She is oriented to person, place, and time. She appears well-developed and well-nourished. No distress.  HENT:  Head: Normocephalic and atraumatic.  Right Ear: Tympanic membrane and ear canal normal.  Left Ear: Tympanic membrane and ear canal normal.  Mouth/Throat: Oropharynx is clear and moist.  Eyes: Pupils are equal, round, and reactive to light. No scleral icterus.  Neck: Normal range of motion. No thyromegaly present.  Cardiovascular: Normal rate and regular rhythm.   No murmur heard. Pulmonary/Chest: Effort normal and breath sounds normal. No respiratory distress. He has no wheezes. She has no rales. She exhibits no tenderness.  Abdominal: Soft. Bowel sounds are normal. She exhibits no distension and no mass. There is no tenderness. There is no rebound and no guarding.  Musculoskeletal: She exhibits no edema.  Lymphadenopathy:    She has no cervical adenopathy.  Neurological: She is alert and oriented to person, place, and time. She has normal patellar reflexes. She  exhibits normal muscle tone. Coordination normal.  Skin: Skin is warm and dry. + area of swelling/erythema/warmth right dorsal thigh Psychiatric: She has a normal mood and affect. Her behavior is normal. Judgment and thought content normal.            Assessment & Plan:           Assessment & Plan:  Cellulitis-  rx with keflex. Pt is advised to call if new/worsening symptoms or if symptoms do not improve. This is likely cause for low grade temp.   Preventative care- Discussed healthy diet, exercise, weight loss.  Obtain routine lab work. EKG tracing is personally reviewed.  EKG notes NSR.  No acute changes.

## 2017-03-08 NOTE — Patient Instructions (Addendum)
Please complete lab work prior to leaving. Work on eating a healthier diet and adding regular exercise. Begin keflex for skin infection on your leg. Call if symptoms worsen or if it does not improve in the next few days. Please schedule follow up with [name].

## 2017-03-09 LAB — URINALYSIS, ROUTINE W REFLEX MICROSCOPIC
BILIRUBIN URINE: NEGATIVE
Bacteria, UA: NONE SEEN /HPF
GLUCOSE, UA: NEGATIVE
HGB URINE DIPSTICK: NEGATIVE
Hyaline Cast: NONE SEEN /LPF
KETONES UR: NEGATIVE
LEUKOCYTES UA: NEGATIVE
NITRITE: NEGATIVE
PH: 6 (ref 5.0–8.0)
RBC / HPF: NONE SEEN /HPF (ref 0–2)
Specific Gravity, Urine: 1.027 (ref 1.001–1.03)
WBC UA: NONE SEEN /HPF (ref 0–5)

## 2017-03-09 LAB — CBC WITH DIFFERENTIAL/PLATELET
Basophils Absolute: 55 cells/uL (ref 0–200)
Basophils Relative: 0.5 %
EOS PCT: 2.9 %
Eosinophils Absolute: 319 cells/uL (ref 15–500)
HEMATOCRIT: 36.2 % (ref 35.0–45.0)
HEMOGLOBIN: 11.9 g/dL (ref 11.7–15.5)
LYMPHS ABS: 3047 {cells}/uL (ref 850–3900)
MCH: 27.9 pg (ref 27.0–33.0)
MCHC: 32.9 g/dL (ref 32.0–36.0)
MCV: 84.8 fL (ref 80.0–100.0)
MPV: 10.4 fL (ref 7.5–12.5)
Monocytes Relative: 6.5 %
NEUTROS ABS: 6864 {cells}/uL (ref 1500–7800)
NEUTROS PCT: 62.4 %
Platelets: 384 10*3/uL (ref 140–400)
RBC: 4.27 10*6/uL (ref 3.80–5.10)
RDW: 12.5 % (ref 11.0–15.0)
Total Lymphocyte: 27.7 %
WBC: 11 10*3/uL — AB (ref 3.8–10.8)
WBCMIX: 715 {cells}/uL (ref 200–950)

## 2017-03-09 LAB — HEPATIC FUNCTION PANEL
AG Ratio: 1.1 (calc) (ref 1.0–2.5)
ALBUMIN MSPROF: 3.5 g/dL — AB (ref 3.6–5.1)
ALT: 9 U/L (ref 6–29)
AST: 8 U/L — AB (ref 10–35)
Alkaline phosphatase (APISO): 86 U/L (ref 33–115)
BILIRUBIN DIRECT: 0 mg/dL (ref 0.0–0.2)
BILIRUBIN TOTAL: 0.3 mg/dL (ref 0.2–1.2)
GLOBULIN: 3.2 g/dL (ref 1.9–3.7)
Indirect Bilirubin: 0.3 mg/dL (calc) (ref 0.2–1.2)
Total Protein: 6.7 g/dL (ref 6.1–8.1)

## 2017-03-09 LAB — LIPID PANEL
CHOL/HDL RATIO: 2.8 (calc) (ref <5.0)
CHOLESTEROL: 227 mg/dL — AB (ref <200)
HDL: 81 mg/dL (ref >50)
LDL Cholesterol (Calc): 114 mg/dL (calc) — ABNORMAL HIGH
NON-HDL CHOLESTEROL (CALC): 146 mg/dL — AB (ref <130)
Triglycerides: 202 mg/dL — ABNORMAL HIGH (ref <150)

## 2017-03-09 LAB — BASIC METABOLIC PANEL
BUN: 16 mg/dL (ref 7–25)
CHLORIDE: 103 mmol/L (ref 98–110)
CO2: 25 mmol/L (ref 20–32)
CREATININE: 1.02 mg/dL (ref 0.50–1.10)
Calcium: 9.1 mg/dL (ref 8.6–10.2)
Glucose, Bld: 145 mg/dL — ABNORMAL HIGH (ref 65–99)
POTASSIUM: 4.4 mmol/L (ref 3.5–5.3)
Sodium: 138 mmol/L (ref 135–146)

## 2017-03-09 LAB — TSH: TSH: 2.61 mIU/L

## 2017-03-11 ENCOUNTER — Telehealth: Payer: Self-pay | Admitting: Family

## 2017-03-11 DIAGNOSIS — R809 Proteinuria, unspecified: Secondary | ICD-10-CM

## 2017-03-11 NOTE — Telephone Encounter (Signed)
Attempted to reach pt and left message to check mychart acct. Message sent. 

## 2017-03-11 NOTE — Telephone Encounter (Signed)
I have scheduled pt lab appt on 03/22/17 at 7am. Future order has been entered. Please see mychart response and advise re: Crestor?

## 2017-03-11 NOTE — Telephone Encounter (Signed)
Please let pt know that there is is some protein in her urine.  I would like her to repeat UA with micro in 2 weeks please.  Cholesterol is above goal. I* see that she had foot cramping with atorvastatin but did tolerate crestor in the past. Is she willing tor retry crestor for her cholesterol?

## 2017-03-11 NOTE — Addendum Note (Signed)
Addended by: Mervin Kung A on: 03/11/2017 06:15 PM   Modules accepted: Orders

## 2017-03-12 MED ORDER — ROSUVASTATIN CALCIUM 10 MG PO TABS: 10.0000 mg | ORAL_TABLET | Freq: Every day | ORAL | 3 refills | Status: DC

## 2017-03-12 NOTE — Addendum Note (Signed)
Addended by: [name] on: 03/12/2017 12:41 PM   Modules accepted: Orders

## 2017-03-14 ENCOUNTER — Encounter: Payer: Self-pay | Admitting: Family

## 2017-03-15 ENCOUNTER — Other Ambulatory Visit: Payer: Self-pay | Admitting: Family

## 2017-03-15 MED ORDER — MELOXICAM 7.5 MG PO TABS: 7.5000 mg | ORAL_TABLET | Freq: Every day | ORAL | 0 refills | Status: DC

## 2017-03-20 ENCOUNTER — Emergency Department (HOSPITAL_COMMUNITY)
Admission: EM | Admit: 2017-03-20 | Discharge: 2017-03-20 | Disposition: A | Discharge status: Home / Self Care | Payer: 59 | Attending: Emergency Medicine | Admitting: Emergency Medicine

## 2017-03-20 ENCOUNTER — Emergency Department (HOSPITAL_COMMUNITY): Payer: 59

## 2017-03-20 ENCOUNTER — Encounter (HOSPITAL_COMMUNITY): Payer: Self-pay | Admitting: Emergency Medicine

## 2017-03-20 DIAGNOSIS — Z794 Long term (current) use of insulin: Secondary | ICD-10-CM | POA: Diagnosis not present

## 2017-03-20 DIAGNOSIS — K5900 Constipation, unspecified: Secondary | ICD-10-CM

## 2017-03-20 DIAGNOSIS — E119 Type 2 diabetes mellitus without complications: Secondary | ICD-10-CM | POA: Insufficient documentation

## 2017-03-20 DIAGNOSIS — Z793 Long term (current) use of hormonal contraceptives: Secondary | ICD-10-CM | POA: Insufficient documentation

## 2017-03-20 DIAGNOSIS — I10 Essential (primary) hypertension: Secondary | ICD-10-CM | POA: Insufficient documentation

## 2017-03-20 DIAGNOSIS — Z79899 Other long term (current) drug therapy: Secondary | ICD-10-CM | POA: Diagnosis not present

## 2017-03-20 LAB — URINALYSIS, ROUTINE W REFLEX MICROSCOPIC
BILIRUBIN URINE: NEGATIVE
GLUCOSE, UA: NEGATIVE mg/dL
Ketones, ur: 5 mg/dL — AB
Leukocytes, UA: NEGATIVE
NITRITE: NEGATIVE
PH: 5 (ref 5.0–8.0)
PROTEIN: 100 mg/dL — AB
SPECIFIC GRAVITY, URINE: 1.026 (ref 1.005–1.030)

## 2017-03-20 LAB — POC URINE PREG, ED: PREG TEST UR: NEGATIVE

## 2017-03-20 MED ORDER — MILK AND MOLASSES ENEMA
1.0000 | Freq: Once | RECTAL | Status: AC
  Administered 2017-03-20: 250 mL via RECTAL
  Filled 2017-03-20: qty 250

## 2017-03-20 MED ORDER — POLYETHYLENE GLYCOL 3350 17 GM/SCOOP PO POWD: 17.0000 g | Freq: Two times a day (BID) | ORAL | 0 refills | Status: DC

## 2017-03-20 NOTE — ED Triage Notes (Signed)
Pt states she has been trying to use the restroom and is unable  Pt states she is having pain in her rectum from straining

## 2017-03-20 NOTE — ED Provider Notes (Signed)
[facility]-EMERGENCY DEPT Provider Note   CSN: [id number] Arrival date & time: 03/20/17  0303     History   Chief Complaint Chief Complaint  Patient presents with  . Constipation    HPI Audrey Goodman is a 46 y.o. female with a hx of IDDM, depression, diverticulitis, HTN, cholecystitis, presents to the Emergency Department complaining of gradual, persistent, progressively worsening constipation onset this afternoon.  Pt reports last BM was 3 days ago.  Pt has tried docolax and manual disimpaction at home without relief. Associated symptoms include nausea.  Pt reports she does have flatus.  No melena or hematochezia.  Pt reports that straining for BM induces lower abd cramping and rectal pain.  Pt reports she has intermittent constipation for which she uses Doculax.    Pt denies fever, chills, headache, neck pain, chest pain, SOB, weakness, dizziness, syncope.     The history is provided by the patient and medical records. No language interpreter was used.    Past Medical History:  Diagnosis Date  . Arthritis    left shoulder  . Depression   . Diabetes mellitus without complication (HCC)    type 2  . Diverticulitis   . High cholesterol   . History of chicken pox   . Hyperlipidemia   . Hypertension   . Shingles   . Sleep apnea    uses CPAP nightly  . SVD (spontaneous vaginal delivery) 1995   x 1  . Type 2 diabetes mellitus, uncontrolled (HCC) 07/30/2006   Qualifier: Diagnosis of  By: Lanier Prude  MD, Taineisha      Patient Active Problem List   Diagnosis Date Noted  . Depression 07/13/2015  . Preventative health care 06/23/2014  . PCOS (polycystic ovarian syndrome) 09/10/2013  . CHOLELITHIASIS 02/20/2008  . HYPERLIPIDEMIA NEC/NOS 08/06/2006  . Type 2 diabetes mellitus, uncontrolled (HCC) 07/30/2006  . HYPERTENSION, BENIGN ESSENTIAL 07/30/2006  . MORBID OBESITY 07/25/2006    Past Surgical History:  Procedure Laterality Date  . ANKLE FRACTURE  SURGERY  1991   right  . CHOLECYSTECTOMY  2008  . HYSTEROSCOPY N/A 09/13/2014   Procedure: HYSTEROSCOPY with IUD removal;  Surgeon: Mitchel Honour, DO;  Location: [facility];  Service: Gynecology;  Laterality: N/A;  need longest speculum and instruments that aresavailable due to patient's habitus (BMI ~60)    OB History    No data available       Home Medications    Prior to Admission medications   Medication Sig Start Date End Date Taking? Authorizing Provider  buPROPion (WELLBUTRIN XL) 150 MG 24 hr tablet Take 1 tablet (150 mg total) by mouth daily. 03/08/17   [name], NP  cephALEXin (KEFLEX) 500 MG capsule Take 1 capsule (500 mg total) by mouth 3 (three) times daily. 03/08/17   [name], NP  Dulaglutide (TRULICITY) 1.5 MG/0.5ML SOPN Inject 0.23ml  weekly under skin 07/31/16   Carlus Pavlov, MD  furosemide (LASIX) 40 MG tablet Take 40 mg by mouth at bedtime as needed for fluid or edema.     [provider]  glucose blood (ONE TOUCH ULTRA TEST) test strip USE TO TEST BLOOD SUGAR 3 TIMES DAILY AS INSTRUCTED 07/13/16   Carlus Pavlov, MD  insulin aspart (NOVOLOG FLEXPEN) 100 UNIT/ML FlexPen INJECT 28 TO 35 UNITS INTO THE SKIN 3 TIMES DAILY BEFORE MEALS. 12/07/15   Carlus Pavlov, MD  Insulin Pen Needle 32G X 4 MM MISC Use to inject insulin 4 times daily 07/13/16   Elvera Lennox,  Silvestre Mesi, MD  LEVEMIR FLEXTOUCH 100 UNIT/ML Pen INJECT 50 UNITS SUBCUTANEOUSLY DAILY AT 10PM 08/23/16   Carlus Pavlov, MD  meloxicam (MOBIC) 7.5 MG tablet Take 1 tablet (7.5 mg total) by mouth daily. 03/15/17   [name], NP  metFORMIN (GLUCOPHAGE) 850 MG tablet Take 1 tablet (850 mg total) by mouth 2 (two) times daily with a meal. 07/27/15   Carlus Pavlov, MD  norgestimate-ethinyl estradiol (ORTHO-CYCLEN,SPRINTEC,PREVIFEM) 0.25-35 MG-MCG tablet Take 1 tablet by mouth daily.    [provider]  NOVOLOG FLEXPEN 100 UNIT/ML FlexPen INJECT 28 TO 35 UNITS INTO THE SKIN 3  TIMES DAILY BEFORE MEALS. 11/30/16   Carlus Pavlov, MD  polyethylene glycol powder (GLYCOLAX/MIRALAX) powder Take 17 g by mouth 2 (two) times daily. 03/20/17   Ashton Sabine, Dahlia Client, PA-C  rosuvastatin (CRESTOR) 10 MG tablet Take 1 tablet (10 mg total) by mouth daily. 03/12/17   [name], NP  spironolactone (ALDACTONE) 50 MG tablet TAKE 1 TABLET (50 MG TOTAL) BY MOUTH 2 (TWO) TIMES DAILY. 10/08/16   Carlus Pavlov, MD    Family History Family History  Problem Relation Age of Onset  . Cancer Mother        breast  . Hyperlipidemia Mother   . Heart disease Mother   . Hypertension Mother   . Mental illness Mother        depression  . Diabetes Mother   . Hyperlipidemia Father   . Stroke Father   . Hypertension Father   . Cancer Maternal Grandmother        colon  . Diabetes Maternal Grandmother   . Diabetes Maternal Uncle   . Diabetes Maternal Aunt   . Diabetes Maternal Uncle     Social History Social History  Substance Use Topics  . Smoking status: Never Smoker  . Smokeless tobacco: Never Used  . Alcohol use No     Allergies   Lisinopril and Lipitor [atorvastatin]   Review of Systems Review of Systems  Constitutional: Negative for appetite change, diaphoresis, fatigue, fever and unexpected weight change.  HENT: Negative for mouth sores.   Eyes: Negative for visual disturbance.  Respiratory: Negative for cough, chest tightness, shortness of breath and wheezing.   Cardiovascular: Negative for chest pain.  Gastrointestinal: Positive for abdominal pain (lower, only with straining) and constipation. Negative for diarrhea, nausea and vomiting.       Rectal pain  Endocrine: Negative for polydipsia, polyphagia and polyuria.  Genitourinary: Negative for dysuria, frequency, hematuria and urgency.  Musculoskeletal: Negative for back pain and neck stiffness.  Skin: Negative for rash.  Allergic/Immunologic: Negative for immunocompromised state.  Neurological:  Negative for syncope, light-headedness and headaches.  Hematological: Does not bruise/bleed easily.  Psychiatric/Behavioral: Negative for sleep disturbance. The patient is not nervous/anxious.      Physical Exam Updated Vital Signs BP (!) 156/60 (BP Location: Right Arm)   Pulse 94   Temp 98 F (36.7 C) (Oral)   Resp 18   Ht 5\' 5"  (1.651 m)   Wt (!) 144.7 kg (319 lb)   SpO2 98%   BMI 53.08 kg/m   Physical Exam  Constitutional: She appears well-developed and well-nourished. No distress.  Awake, alert, nontoxic appearance  HENT:  Head: Normocephalic and atraumatic.  Mouth/Throat: Oropharynx is clear and moist. No oropharyngeal exudate.  Eyes: Conjunctivae are normal. No scleral icterus.  Neck: Normal range of motion. Neck supple.  Cardiovascular: Normal rate, regular rhythm and intact distal pulses.   Pulmonary/Chest: Effort normal and breath sounds normal. No  respiratory distress. She has no wheezes.  Equal chest expansion  Abdominal: Soft. Bowel sounds are normal. She exhibits no mass. There is no tenderness ( mild, lower abd). There is no rigidity, no rebound, no guarding and no CVA tenderness.  Obese abdomen limiting exam Soft and nontender  Genitourinary: Rectal exam shows external hemorrhoid. Pelvic exam was performed with patient in the knee-chest position.  Genitourinary Comments: Small external hemorrhoid noted.  Rectal vault is full of soft brown stool.  Musculoskeletal: Normal range of motion. She exhibits no edema.  Neurological: She is alert.  Speech is clear and goal oriented Moves extremities without ataxia  Skin: Skin is warm and dry. She is not diaphoretic.  Psychiatric: She has a normal mood and affect.  Nursing note and vitals reviewed.    ED Treatments / Results  Labs (all labs ordered are listed, but only abnormal results are displayed) Labs Reviewed  URINALYSIS, ROUTINE W REFLEX MICROSCOPIC - Abnormal; Notable for the following:       Result Value     APPearance HAZY (*)    Hgb urine dipstick SMALL (*)    Ketones, ur 5 (*)    Protein, ur 100 (*)    Bacteria, UA RARE (*)    Squamous Epithelial / LPF 0-5 (*)    All other components within normal limits  POC URINE PREG, ED    Radiology Dg Abdomen Acute W/chest  Result Date: 03/20/2017 CLINICAL DATA:  Constipation for 1 week. EXAM: DG ABDOMEN ACUTE W/ 1V CHEST COMPARISON:  Chest radiograph April 08, 2008 FINDINGS: Cardiomediastinal silhouette is normal. Lungs are clear, no pleural effusions. No pneumothorax. Soft tissue planes and included osseous structures are unremarkable. Bowel gas pattern is nondilated and nonobstructive. Moderate amount of retained large bowel stool. Surgical clips in the included right abdomen compatible with cholecystectomy. No intra-abdominal mass effect, pathologic calcifications or free air. Soft tissue planes and included osseous structures are non-suspicious. IMPRESSION: Normal chest. Moderate amount of retained large bowel stool. Normal bowel gas pattern. Electronically Signed   By: Awilda Metroourtnay  Bloomer M.D.   On: 03/20/2017 05:51    Procedures Procedures (including critical care time)  Medications Ordered in ED Medications  milk and molasses enema (not administered)     Initial Impression / Assessment and Plan / ED Course  I have reviewed the triage vital signs and the nursing notes.  Pertinent labs & imaging results that were available during my care of the patient were reviewed by me and considered in my medical decision making (see chart for details).  Clinical Course as of Mar 20 699  Wed Mar 20, 2017  [id number] Pt with large volume bowel movement and feels much better  [HM]    Clinical Course User Index [HM] Doy Taaffe, Dahlia ClientHannah, [place] JerseyPA-C    Patient presents with constipation and rectal pain.  No clinical evidence of impaction on DRE.  Plain films show large stool burden without evidence of perforation.  No free air.  Patient will be given enema  and bowel regimen discussed.  7:00 AM Pt has had a BM here in the ED and feels much better.  She will be d/c home.    Final Clinical Impressions(s) / ED Diagnoses   Final diagnoses:  Constipation, unspecified constipation type    New Prescriptions New Prescriptions   POLYETHYLENE GLYCOL POWDER (GLYCOLAX/MIRALAX) POWDER    Take 17 g by mouth 2 (two) times daily.     Bobie Kistler, Boyd KerbsHannah, PA-C 03/20/17 0701    Palumbo, April, MD  03/20/17 0731  

## 2017-03-20 NOTE — Discharge Instructions (Signed)
1. Medications: Miralax, usual home medications 2. Treatment: rest, drink plenty of fluids,  3. Follow Up: Please followup with your primary doctor in 2-3 days for discussion of your diagnoses and further evaluation after today's visit; if you do not have a primary care doctor use the resource guide provided to find one; Please return to the ER for worsening symptoms or other concerns

## 2017-03-22 ENCOUNTER — Telehealth: Payer: Self-pay | Admitting: Family

## 2017-03-22 ENCOUNTER — Other Ambulatory Visit (INDEPENDENT_AMBULATORY_CARE_PROVIDER_SITE_OTHER): Payer: 59

## 2017-03-22 DIAGNOSIS — R809 Proteinuria, unspecified: Secondary | ICD-10-CM

## 2017-03-22 DIAGNOSIS — R801 Persistent proteinuria, unspecified: Secondary | ICD-10-CM

## 2017-03-22 LAB — URINALYSIS, ROUTINE W REFLEX MICROSCOPIC
Ketones, ur: NEGATIVE
LEUKOCYTES UA: NEGATIVE
NITRITE: NEGATIVE
Specific Gravity, Urine: >=1.03 — AB (ref 1.000–1.030)
Total Protein, Urine: 100 — AB
Urine Glucose: NEGATIVE
Urobilinogen, UA: 0.2 (ref 0.0–1.0)
pH: 6 (ref 5.0–8.0)

## 2017-03-22 NOTE — Telephone Encounter (Signed)
Please contact patient and let him know follow-up urinalysis is still showing a good amount of protein.  I would like for her to return to the lab at her convenience to pick up a 24-hour urine collection container and some additional lab work.

## 2017-03-25 NOTE — Telephone Encounter (Signed)
Pt called back and was scheduled for lab appt for tomorrow at 7:15am. Future orders were already signed. [place]

## 2017-03-25 NOTE — Telephone Encounter (Signed)
Attempted to reach pt and left message to check mychart acct. Message sent. 

## 2017-03-26 ENCOUNTER — Other Ambulatory Visit: Payer: Self-pay | Admitting: Family

## 2017-03-26 ENCOUNTER — Other Ambulatory Visit: Payer: 59

## 2017-03-28 ENCOUNTER — Other Ambulatory Visit: Payer: 59

## 2017-03-28 DIAGNOSIS — R801 Persistent proteinuria, unspecified: Secondary | ICD-10-CM

## 2017-03-28 NOTE — Addendum Note (Signed)
Addended by: Harley AltoPRICE, KRISTY M on: 03/28/2017 07:27 AM   Modules accepted: Orders

## 2017-03-29 LAB — PROTEIN, URINE, 24 HOUR: Protein, 24H Urine: 480 mg/24 h — ABNORMAL HIGH (ref 0–149)

## 2017-04-01 LAB — PROTEIN ELECTROPHORESIS,RANDOM URN
ALBUMIN UR 24 HR ELECTRO: 63 %
ALPHA-1-GLOBULIN, U: 6 %
Alpha-2-Globulin, U: 8 %
BETA GLOBULIN, U: 12 %
CREATININE, URINE: 189 mg/dL (ref 20–275)
GAMMA GLOBULIN, U: 11 %
Protein/Creat Ratio: 180 mg/g creat — ABNORMAL HIGH (ref 21–161)
TOTAL PROTEIN, URINE: 34 mg/dL — AB (ref 5–24)

## 2017-04-02 ENCOUNTER — Telehealth: Payer: Self-pay | Admitting: Family

## 2017-04-02 DIAGNOSIS — R809 Proteinuria, unspecified: Secondary | ICD-10-CM

## 2017-04-02 NOTE — Telephone Encounter (Signed)
Please let patient know that her urine shows protein.  This is most likely due to her history of diabetes and hypertension.  However I would like for her to meet with a kidney specialist.  Referral is pending.

## 2017-04-02 NOTE — Telephone Encounter (Signed)
Patient called and stts she needs to speak to someone about test results that posted to my chart. Patient would like to speak to someone in regards to this. Please call patient back at @ [phone].

## 2017-04-02 NOTE — Telephone Encounter (Signed)
Notified pt and she is agreeable to proceed with referral. 

## 2017-04-09 ENCOUNTER — Other Ambulatory Visit: Payer: Self-pay | Admitting: Internal Medicine

## 2017-04-10 ENCOUNTER — Telehealth: Payer: Self-pay | Admitting: Family

## 2017-04-11 ENCOUNTER — Encounter: Payer: Self-pay | Admitting: Family

## 2017-04-11 NOTE — Telephone Encounter (Signed)
Pt requesting refill on Keflex 500mg ?

## 2017-04-11 NOTE — Telephone Encounter (Signed)
Need office visit first please.

## 2017-04-12 ENCOUNTER — Ambulatory Visit: Payer: 59 | Admitting: Family Medicine

## 2017-04-12 NOTE — Telephone Encounter (Signed)
Schedule left message for pt to call us around 9 this morning. Pt sent another mychart message so I responded via mychart that pt needs to call for an appointment.

## 2017-04-12 NOTE — Telephone Encounter (Signed)
Sent mychart message and scheduled pt appt for today.

## 2017-04-12 NOTE — Telephone Encounter (Signed)
lvm for pt to return call to schedule an apt.  °

## 2017-04-12 NOTE — Telephone Encounter (Signed)
Patient needs OV.

## 2017-04-15 NOTE — Telephone Encounter (Signed)
Opened in error

## 2017-04-23 ENCOUNTER — Encounter: Payer: Self-pay | Admitting: Internal Medicine

## 2017-04-23 ENCOUNTER — Other Ambulatory Visit: Payer: Self-pay | Admitting: Family

## 2017-04-23 ENCOUNTER — Ambulatory Visit: Payer: 59 | Admitting: Internal Medicine

## 2017-04-23 VITALS — BP 138/88 | HR 93 | Wt 325.0 lb

## 2017-04-23 DIAGNOSIS — E282 Polycystic ovarian syndrome: Secondary | ICD-10-CM

## 2017-04-23 DIAGNOSIS — E785 Hyperlipidemia, unspecified: Secondary | ICD-10-CM | POA: Diagnosis not present

## 2017-04-23 DIAGNOSIS — E1165 Type 2 diabetes mellitus with hyperglycemia: Secondary | ICD-10-CM

## 2017-04-23 LAB — POCT GLYCOSYLATED HEMOGLOBIN (HGB A1C): Hemoglobin A1C: 8

## 2017-04-23 MED ORDER — METFORMIN HCL 850 MG PO TABS: 850.0000 mg | ORAL_TABLET | Freq: Two times a day (BID) | ORAL | 3 refills | Status: DC

## 2017-04-23 MED ORDER — INSULIN ASPART 100 UNIT/ML FLEXPEN: PEN_INJECTOR | SUBCUTANEOUS | 3 refills | Status: DC

## 2017-04-23 NOTE — Patient Instructions (Addendum)
Please look up: Rip Esselstyn - The Engine 2 diet   Rip Esselstyn - The Engine 2 7-day Rescue diet  Please restart:  - Metformin 850 mg 2x a day   Please continue: - Trulicity 1.5 mg weekly - Levemir 50 units in am  - Novolog - smaller meal: 15 units  - large meal: 25 units  - NovoLog SSI:  - 150- 165: + 2 unit  - 166- 180: + 3 units  - 181- 195: + 4 units  - 196- 210: + 5 units  - 211- 225: + 6 units  - 226- 240: + 7 units  - 241- 255: + 8 units  - >255: + 9 units  Please return in 3 months with your sugar log.

## 2017-04-23 NOTE — Addendum Note (Signed)
Addended by: Yolande JollyLAWSON, Emre Stock on: 04/23/2017 02:54 PM   Modules accepted: Orders

## 2017-04-23 NOTE — Progress Notes (Signed)
Patient ID: Chauncey CruelLena C Heinz, female   DOB: Nov 14, 1970, 46 y.o.   MRN: [id number]  HPI: Chauncey CruelLena C Fann is a 46 y.o.-year-old female, returning for f/u for DM2, dx with GDM in 1995, then DM 6 mo after son was born, insulin-dependent since ~2010, uncontrolled, without complications and PCOS. Last visit 9 mo ago!  She was in the emergency room with constipation on 03/20/2017.  She started Wellbutrin ER and increased the dose >> feeling better.  Last hemoglobin A1c was: Lab Results  Component Value Date   HGBA1C 8.1 06/18/2016   HGBA1C 7.9 07/27/2015   HGBA1C 10.2 (H) 06/22/2014   Pt is on a regimen of: - Levemir 50 units in am  - Novolog insulin with meals as follows: - smaller meal: 20 >> 15 units  - large meal: 30 >> 25 units  - NovoLog SSI:  - 150- 165: + 2 unit  - 166- 180: + 3 units  - 181- 195: + 4 units  - 196- 210: + 5 units  - 211- 225: + 6 units  - 226- 240: + 7 units  - 241- 255: + 8 units  - >255: + 9 units - Trulicity 0.75 mg weekly  - started 05/2016 >> 1.5 mg weekly She stopped Metformin 850 mg 2x a day - 2 mo ago b/c hypoglycemia!  Pt checks her sugars 1x a day: - am:  134-200 (when forgets insulin) >> 120-140,184 >> 120-150 - 2h after b'fast: n/c - before lunch: 150-160 >> 150-170 >> n/c >> 79 x1 >> 130-140 - 2h after lunch: n/c >> 113 >> n/c - before dinner: 150s (rarely 200) >> 140-160 >> n/c >> 130s >> 150-160 - 2h after dinner: n/c - bedtime: n/c >> 150s >> n/c - nighttime: n/c Lowest sugar was  59 >> 69 >> 79 >> 69 (ate a small dinner); she has hypoglycemia awareness at 80.  Highest sugar was 273 (missed insulin) >> 180  - + CKD, last BUN/creatinine:  Lab Results  Component Value Date   BUN 16 03/08/2017   CREATININE 1.02 03/08/2017  Off Lisinopril. She had increased urinary proteins recently >> will see nephrology soon: Component     Latest Ref Rng & Units 03/28/2017  Creatinine, Urine     20 - 275 mg/dL [id number]  Protein/Creat Ratio     21 - 161  mg/g creat 180 (H)  Total Protein, Urine     5 - 24 mg/dL 34 (H)  Albumin     % 63  Alpha-1-Globulin, U     % 6  ALPHA-2-GLOBULIN, U     % 8  Beta Globulin, U     % 12  Gamma Globulin, U     % 11  Interpretation        Prot,24hr calculated     0 - 149 mg/24 h 480 (H)   - + HL; last set of lipids: Lab Results  Component Value Date   CHOL 227 (H) 03/08/2017   HDL 81 03/08/2017   LDLCALC 124 (H) 06/18/2016   TRIG 202 (H) 03/08/2017   CHOLHDL 2.8 03/08/2017  Off Zetia, , Crestor 40 - stopped b/c cramps. Restarted at 5 mg in 05/2016.  Now on Crestor 10 mg daily. - last eye exam was: 05/2016 >> No DR. Dr. Dione BoozeGroat. - no numbness and tingling in her feet.  No GBP as she could not afford it.   PCOS: - has had irreg. Menses since menarche - + 1 pregnancy,  no miscarriages - was on Mirena IUD - for ~4.5 years >> OCP (Sprintec), in 2016 >> no SEs - On Spironolactone 50 mg bid - + significant hirsutism >> helped by Spironolactone  - on Metformin 850 mg bid  Last K normal last mo: Lab Results  Component Value Date   K 4.4 03/08/2017   ROS: Constitutional: + weight gain/+ weight loss, no fatigue, no subjective hyperthermia, no subjective hypothermia Eyes: no blurry vision, no xerophthalmia ENT: no sore throat, no nodules palpated in throat, no dysphagia, no odynophagia, no hoarseness Cardiovascular: no CP/no SOB/no palpitations/no leg swelling Respiratory: no cough/no SOB/no wheezing Gastrointestinal: no N/no V/no D/no C/no acid reflux Musculoskeletal: no muscle aches/no joint aches Skin: no rashes, no hair loss Neurological: no tremors/no numbness/no tingling/no dizziness  I reviewed pt's medications, allergies, PMH, social hx, family hx, and changes were documented in the history of present illness. Otherwise, unchanged from my initial visit note.  PE: BP 138/88   Pulse 93   Wt (!) 325 lb (147.4 kg)   SpO2 97%   BMI 54.08 kg/m  Wt Readings from Last 3 Encounters:   04/23/17 (!) 325 lb (147.4 kg)  03/20/17 (!) 319 lb (144.7 kg)  03/08/17 (!) 329 lb 6.4 oz (149.4 kg)   Constitutional: overweight, in NAD Eyes: PERRLA, EOMI, no exophthalmos ENT: moist mucous membranes, no thyromegaly, no cervical lymphadenopathy Cardiovascular: RRR, No MRG Respiratory: CTA B Gastrointestinal: abdomen soft, NT, ND, BS+ Musculoskeletal: no deformities, strength intact in all 4 Skin: moist, warm, ++ hirsutism on chin and chest; no acne Neurological: no tremor with outstretched hands, DTR normal in all 4  ASSESSMENT: 1. DM2, insulin-dependent, uncontrolled, without complications  2. PCOS Component     Latest Ref Rng 10/16/2013  Testosterone     10 - 70 ng/dL 54  Sex Hormone Binding     18 - 114 nmol/L 12 (L)  Testosterone Free     0.6 - 6.8 pg/mL 15.5 (H)  Testosterone-% Free     0.4 - 2.4 % 2.9 (H)  TSH     0.35 - 4.50 uIU/mL 0.54  17-OH-Progesterone, LC/MS/MS      <8  Free T4     0.60 - 1.60 ng/dL 1.610.84  T3, Free     2.3 - 4.2 pg/mL 3.0   Labs confirmed PCOS (high testosterone, low 17-HO Prog, normal TFTs). We could not test LH and FSH since on Mirena.  PLAN:  1. DM2 - Patient with long-standing, uncontrolled diabetes, returning after long absence.  She is on basal-bolus insulin regimen with Trulicity and previously metformin, however, she did stop metformin 2 months ago due to lower blood sugars while she was on a diet.  We discussed that she should have continued Metformin and decreased the dose of her insulin.   - At this visit, since sugars are above target, we will restart metformin, continue Trulicity and for now, stay on the same dose of insulin - We discussed about dietary changes and I made some suggestions (given references) -  I suggested to:  Patient Instructions  Please continue: - Metformin 850 mg 2x a day  - Trulicity 1.5 mg weekly - Levemir 50 units in am  - Novolog - smaller meal: 15 units  - large meal: 25 units  - NovoLog SSI:   - 150- 165: + 2 unit  - 166- 180: + 3 units  - 181- 195: + 4 units  - 196- 210: + 5 units  - 211- 225: +  6 units  - 226- 240: + 7 units  - 241- 255: + 8 units  - >255: + 9 units  Please return in 3 months with your sugar log.   - today, HbA1c is 8% (stable) - continue checking sugars at different times of the day - check 3x a day, rotating checks - advised for yearly eye exams >> she is UTD - Return to clinic in 3 mo with sugar log   2. PCOS - pt with clinically evident PCOS, confirmed biochemically in 2015 - she had the IUD removed >> on OCP's (Orthocyclen) since 2016. No SEs. - continue Metformin 850 mg 2x a day - continue Spironolactone 50 mg 2x a day - latest K reviewed >> normal 02/2017  3. HL - LDL above target at last check in 02/2017.  - On Crestor. No SEs >> continue.  Carlus Pavlov, MD PhD [facility]

## 2017-04-24 ENCOUNTER — Telehealth: Payer: Self-pay

## 2017-04-24 MED ORDER — GLUCOSE BLOOD VI STRP: ORAL_STRIP | 11 refills | Status: DC

## 2017-04-24 NOTE — Telephone Encounter (Signed)
Received fax from [facility] in regards to a refill on One Touch Ultra test strips for patient. Sent to pharmacy

## 2017-05-08 ENCOUNTER — Other Ambulatory Visit: Payer: Self-pay | Admitting: Internal Medicine

## 2017-05-30 ENCOUNTER — Other Ambulatory Visit: Payer: Self-pay | Admitting: Family

## 2017-05-31 NOTE — Telephone Encounter (Signed)
Audrey Goodman-- please advise meloxicam refill. Pt last seen 02/2017 and has no future appt scheduled. Last refill was 04/23/17, #30.

## 2017-06-01 ENCOUNTER — Other Ambulatory Visit: Payer: Self-pay | Admitting: Internal Medicine

## 2017-06-04 ENCOUNTER — Other Ambulatory Visit: Payer: Self-pay | Admitting: Internal Medicine

## 2017-06-04 MED ORDER — SPIRONOLACTONE 50 MG PO TABS: 50.0000 mg | ORAL_TABLET | Freq: Two times a day (BID) | ORAL | 0 refills | Status: DC

## 2017-06-04 NOTE — Telephone Encounter (Signed)
OK 

## 2017-06-04 NOTE — Telephone Encounter (Signed)
Is this okay to refill? 

## 2017-06-14 ENCOUNTER — Encounter: Payer: Self-pay | Admitting: Family

## 2017-06-14 ENCOUNTER — Ambulatory Visit: Payer: 59 | Admitting: Family

## 2017-06-14 VITALS — BP 148/70 | HR 81 | Temp 99.0°F | Resp 16 | Ht 65.0 in | Wt 323.0 lb

## 2017-06-14 DIAGNOSIS — M7660 Achilles tendinitis, unspecified leg: Secondary | ICD-10-CM

## 2017-06-14 NOTE — Patient Instructions (Signed)
Please continue meloxicam. Ice heel/achilles twice daily. Elevate/rest as able. You will be contacted about your referral to sports medicine.

## 2017-06-14 NOTE — Progress Notes (Signed)
Subjective:    Patient ID: Audrey Goodman, female    DOB: 1971-03-11, 47 y.o.   MRN: [id number]  HPI  Patient is a 47 yr old female who presents today with chief complaint of left heel pain.  Pain started 3 days ago. She denies known injury. Has been taking tylenol, meloxicam, elevation. Reports that elevation helps but as soon as she weight bears her symptoms return.     Review of Systems See HPI  Past Medical History:  Diagnosis Date  . Arthritis    left shoulder  . Depression   . Diabetes mellitus without complication (HCC)    type 2  . Diverticulitis   . High cholesterol   . History of chicken pox   . Hyperlipidemia   . Hypertension   . Shingles   . Sleep apnea    uses CPAP nightly  . SVD (spontaneous vaginal delivery) 1995   x 1  . Type 2 diabetes mellitus, uncontrolled (HCC) 07/30/2006   Qualifier: Diagnosis of  By: Lanier PrudeBolden  MD, Cathrine Musteraineisha       Social History   Socioeconomic History  . Marital status: Single    Spouse name: Not on file  . Number of children: Not on file  . Years of education: Not on file  . Highest education level: Not on file  Social Needs  . Financial resource strain: Not on file  . Food insecurity - worry: Not on file  . Food insecurity - inability: Not on file  . Transportation needs - medical: Not on file  . Transportation needs - non-medical: Not on file  Occupational History  . Not on file  Tobacco Use  . Smoking status: Never Smoker  . Smokeless tobacco: Never Used  Substance and Sexual Activity  . Alcohol use: No    Alcohol/week: 0.0 oz  . Drug use: No  . Sexual activity: Yes    Birth control/protection: Pill  Other Topics Concern  . Not on file  Social History Narrative   Single- lives with boyfriend.   Has a boyfriend   Son 1921- senior at [employer]   Enjoys reading   Works as a Advertising account plannerclaims processor at [employer]     Past Surgical History:  Procedure Laterality Date  . ANKLE FRACTURE SURGERY  1991   right  .  CHOLECYSTECTOMY  2008  . HYSTEROSCOPY N/A 09/13/2014   Procedure: HYSTEROSCOPY with IUD removal;  Surgeon: Mitchel HonourMegan Morris, DO;  Location: [facility];  Service: Gynecology;  Laterality: N/A;  need longest speculum and instruments that aresavailable due to patient's habitus (BMI ~60)    Family History  Problem Relation Age of Onset  . Cancer Mother        breast  . Hyperlipidemia Mother   . Heart disease Mother   . Hypertension Mother   . Mental illness Mother        depression  . Diabetes Mother   . Hyperlipidemia Father   . Stroke Father   . Hypertension Father   . Cancer Maternal Grandmother        colon  . Diabetes Maternal Grandmother   . Diabetes Maternal Uncle   . Diabetes Maternal Aunt   . Diabetes Maternal Uncle     Allergies  Allergen Reactions  . Lisinopril Swelling    Swelling of the left side of tongue  . Lipitor [Atorvastatin] Other (See Comments)    Myalgia/foot cramping    Current Outpatient Medications on File Prior to Visit  Medication Sig  Dispense Refill  . buPROPion (WELLBUTRIN XL) 150 MG 24 hr tablet Take 1 tablet (150 mg total) by mouth daily. 90 tablet 1  . Dulaglutide (TRULICITY) 1.5 MG/0.5ML SOPN Inject 0.31ml  weekly under skin 12 pen 0  . furosemide (LASIX) 40 MG tablet Take 40 mg by mouth at bedtime as needed for fluid or edema.     Marland Kitchen glucose blood (ONE TOUCH ULTRA TEST) test strip USE TO TEST BLOOD SUGAR 3 TIMES DAILY AS INSTRUCTED 300 each 11  . insulin aspart (NOVOLOG FLEXPEN) 100 UNIT/ML FlexPen INJECT 15-30 UNITS INTO THE SKIN 3 TIMES DAILY BEFORE MEALS. 105 mL 3  . Insulin Pen Needle 32G X 4 MM MISC Use to inject insulin 4 times daily 400 each 3  . LEVEMIR FLEXTOUCH 100 UNIT/ML Pen INJECT 50 UNITS SUBCUTANEOUSLY DAILY AT 10PM 45 pen 1  . meloxicam (MOBIC) 7.5 MG tablet TAKE 1 TABLET BY MOUTH EVERY DAY 30 tablet 0  . metFORMIN (GLUCOPHAGE) 850 MG tablet Take 1 tablet (850 mg total) by mouth 2 (two) times daily with a meal. 180 tablet 3  .  norgestimate-ethinyl estradiol (ORTHO-CYCLEN,SPRINTEC,PREVIFEM) 0.25-35 MG-MCG tablet Take 1 tablet by mouth daily.    Marland Kitchen NOVOLOG FLEXPEN 100 UNIT/ML FlexPen INJECT 28 TO 35 UNITS INTO THE SKIN 3 TIMES DAILY BEFORE MEALS. 105 pen 0  . polyethylene glycol powder (GLYCOLAX/MIRALAX) powder Take 17 g by mouth 2 (two) times daily. 255 g 0  . rosuvastatin (CRESTOR) 10 MG tablet Take 1 tablet (10 mg total) by mouth daily. 30 tablet 3  . spironolactone (ALDACTONE) 50 MG tablet Take 1 tablet (50 mg total) by mouth 2 (two) times daily. 180 tablet 0   No current facility-administered medications on file prior to visit.     BP (!) 148/70 (BP Location: Right Arm, Patient Position: Sitting, Cuff Size: Normal)   Pulse 81   Temp 99 F (37.2 C) (Oral)   Resp 16   Ht 5\' 5"  (1.651 m)   Wt (!) 323 lb (146.5 kg)   SpO2 99%   BMI 53.75 kg/m       Objective:   Physical Exam  Constitutional: She is oriented to person, place, and time. She appears well-developed and well-nourished.  Cardiovascular: Normal rate, regular rhythm and normal heart sounds.  No murmur heard. Pulmonary/Chest: Effort normal and breath sounds normal. No respiratory distress. She has no wheezes.  Musculoskeletal: She exhibits no edema.  + tenderness to palpation of left posterior heel and overlying left achilles tendon.  Increased pain with flexion of left foot.   Neurological: She is alert and oriented to person, place, and time.  Psychiatric: She has a normal mood and affect. Her behavior is normal. Judgment and thought content normal.          Assessment & Plan:  Achilles tendon pain- new.  Advised pt as follows:  Please continue meloxicam. Ice heel/achilles twice daily. Elevate/rest as able. You will be contacted about your referral to sports medicine.

## 2017-06-18 ENCOUNTER — Encounter: Payer: Self-pay | Admitting: Family Medicine

## 2017-06-18 ENCOUNTER — Ambulatory Visit: Payer: 59 | Admitting: Family Medicine

## 2017-06-18 DIAGNOSIS — M7662 Achilles tendinitis, left leg: Secondary | ICD-10-CM

## 2017-06-18 NOTE — Patient Instructions (Addendum)
You have Achilles Tendinopathy Aleve 2 tabs twice a day with food for pain and inflammation - take for 7-10 days then as needed. Stop the meloxicam for now while you try the aleve instead. Calf raises 3 sets of 10 on level ground once a day first. When these are easy, can do them one legged 3 sets of 10. Finally advance to doing them on a step. Can add heel walks, toe walks forward and backward as well Icing 15 minutes at a time 3-4 times a day. Avoid uneven ground, hills as much as possible. Heel lifts in shoes or shoes with a natural heel lift. Consider inserts as well (spencos, superfeet, our sports insoles with scaphoid pads). Consider physical therapy, orthotics, nitro patches if not improving as expected. Follow up in 5-6 weeks.

## 2017-06-19 ENCOUNTER — Encounter: Payer: Self-pay | Admitting: Family Medicine

## 2017-06-19 DIAGNOSIS — M7662 Achilles tendinitis, left leg: Secondary | ICD-10-CM | POA: Insufficient documentation

## 2017-06-19 NOTE — Assessment & Plan Note (Signed)
aleve twice a day with food.  Shown home exercises/stretches to do daily.  Icing, avoid uneven ground and hills.  Heel lifts.  Consider inserts, PT, nitro patches.  F/u in 5-6 weeks.

## 2017-06-19 NOTE — Progress Notes (Signed)
PCP and consultation requested by: [name], NP  Subjective:   HPI: Patient is a 47 y.o. female here for left heel pain.  Patient reports she's had posterior left heel pain since 1/16. No acute injury or trauma. No swelling or bruising. Pain worse after walking when she got up in the morning. Tried tylenol, meloxicam. Pain level 3/10 and sharp posteriorly. No skin changes, numbness.  Past Medical History:  Diagnosis Date  . Arthritis    left shoulder  . Depression   . Diabetes mellitus without complication (HCC)    type 2  . Diverticulitis   . High cholesterol   . History of chicken pox   . Hyperlipidemia   . Hypertension   . Shingles   . Sleep apnea    uses CPAP nightly  . SVD (spontaneous vaginal delivery) 1995   x 1  . Type 2 diabetes mellitus, uncontrolled (HCC) 07/30/2006   Qualifier: Diagnosis of  By: Lanier Prude  MD, Cathrine Muster      Current Outpatient Medications on File Prior to Visit  Medication Sig Dispense Refill  . buPROPion (WELLBUTRIN XL) 150 MG 24 hr tablet Take 1 tablet (150 mg total) by mouth daily. 90 tablet 1  . Dulaglutide (TRULICITY) 1.5 MG/0.5ML SOPN Inject 0.68ml  weekly under skin 12 pen 0  . furosemide (LASIX) 40 MG tablet Take 40 mg by mouth at bedtime as needed for fluid or edema.     Marland Kitchen glucose blood (ONE TOUCH ULTRA TEST) test strip USE TO TEST BLOOD SUGAR 3 TIMES DAILY AS INSTRUCTED 300 each 11  . insulin aspart (NOVOLOG FLEXPEN) 100 UNIT/ML FlexPen INJECT 15-30 UNITS INTO THE SKIN 3 TIMES DAILY BEFORE MEALS. 105 mL 3  . Insulin Pen Needle 32G X 4 MM MISC Use to inject insulin 4 times daily 400 each 3  . LEVEMIR FLEXTOUCH 100 UNIT/ML Pen INJECT 50 UNITS SUBCUTANEOUSLY DAILY AT 10PM 45 pen 1  . meloxicam (MOBIC) 7.5 MG tablet TAKE 1 TABLET BY MOUTH EVERY DAY 30 tablet 0  . metFORMIN (GLUCOPHAGE) 850 MG tablet Take 1 tablet (850 mg total) by mouth 2 (two) times daily with a meal. 180 tablet 3  . norgestimate-ethinyl estradiol  (ORTHO-CYCLEN,SPRINTEC,PREVIFEM) 0.25-35 MG-MCG tablet Take 1 tablet by mouth daily.    Marland Kitchen NOVOLOG FLEXPEN 100 UNIT/ML FlexPen INJECT 28 TO 35 UNITS INTO THE SKIN 3 TIMES DAILY BEFORE MEALS. 105 pen 0  . polyethylene glycol powder (GLYCOLAX/MIRALAX) powder Take 17 g by mouth 2 (two) times daily. 255 g 0  . rosuvastatin (CRESTOR) 10 MG tablet Take 1 tablet (10 mg total) by mouth daily. 30 tablet 3  . spironolactone (ALDACTONE) 50 MG tablet Take 1 tablet (50 mg total) by mouth 2 (two) times daily. 180 tablet 0   No current facility-administered medications on file prior to visit.     Past Surgical History:  Procedure Laterality Date  . ANKLE FRACTURE SURGERY  1991   right  . CHOLECYSTECTOMY  2008  . HYSTEROSCOPY N/A 09/13/2014   Procedure: HYSTEROSCOPY with IUD removal;  Surgeon: Mitchel Honour, DO;  Location: [facility];  Service: Gynecology;  Laterality: N/A;  need longest speculum and instruments that aresavailable due to patient's habitus (BMI ~60)    Allergies  Allergen Reactions  . Lisinopril Swelling    Swelling of the left side of tongue  . Lipitor [Atorvastatin] Other (See Comments)    Myalgia/foot cramping    Social History   Socioeconomic History  . Marital status: Single  Spouse name: Not on file  . Number of children: Not on file  . Years of education: Not on file  . Highest education level: Not on file  Social Needs  . Financial resource strain: Not on file  . Food insecurity - worry: Not on file  . Food insecurity - inability: Not on file  . Transportation needs - medical: Not on file  . Transportation needs - non-medical: Not on file  Occupational History  . Not on file  Tobacco Use  . Smoking status: Never Smoker  . Smokeless tobacco: Never Used  Substance and Sexual Activity  . Alcohol use: No    Alcohol/week: 0.0 oz  . Drug use: No  . Sexual activity: Yes    Birth control/protection: Pill  Other Topics Concern  . Not on file  Social History Narrative    Single- lives with boyfriend.   Has a boyfriend   Son 2221- senior at [employer]   Enjoys reading   Works as a Advertising account plannerclaims processor at [employer]     Family History  Problem Relation Age of Onset  . Cancer Mother        breast  . Hyperlipidemia Mother   . Heart disease Mother   . Hypertension Mother   . Mental illness Mother        depression  . Diabetes Mother   . Hyperlipidemia Father   . Stroke Father   . Hypertension Father   . Cancer Maternal Grandmother        colon  . Diabetes Maternal Grandmother   . Diabetes Maternal Uncle   . Diabetes Maternal Aunt   . Diabetes Maternal Uncle     BP 131/82   Pulse 80   Ht 5\' 5"  (1.651 m)   Wt (!) 320 lb (145.2 kg)   BMI 53.25 kg/m   Review of Systems: See HPI above.     Objective:  Physical Exam:  Gen: NAD, comfortable in exam room  Left foot/ankle: No gross deformity, swelling, ecchymoses FROM with pain on plantarflexion and dorsiflexion.  5/5 strength all motions. TTP achilles about 3 cm proximal to insertion on calcaneus.   Negative ant drawer and talar tilt.   Negative syndesmotic compression. Negative calcaneal squeeze. Thompsons test negative. NV intact distally.  Right foot/ankle: No deformity. FROM with 5/5 strength. No tenderness to palpation. NVI distally.   Assessment & Plan:  1. Left achilles tendinopathy - aleve twice a day with food.  Shown home exercises/stretches to do daily.  Icing, avoid uneven ground and hills.  Heel lifts.  Consider inserts, PT, nitro patches.  F/u in 5-6 weeks.

## 2017-07-04 ENCOUNTER — Other Ambulatory Visit: Payer: Self-pay

## 2017-07-04 MED ORDER — ROSUVASTATIN CALCIUM 10 MG PO TABS: 10.0000 mg | ORAL_TABLET | Freq: Every day | ORAL | 0 refills | Status: DC

## 2017-07-25 ENCOUNTER — Ambulatory Visit: Payer: 59 | Admitting: Internal Medicine

## 2017-08-02 ENCOUNTER — Ambulatory Visit: Payer: 59 | Admitting: Family Medicine

## 2017-08-02 DIAGNOSIS — M791 Myalgia, unspecified site: Secondary | ICD-10-CM | POA: Diagnosis not present

## 2017-08-02 DIAGNOSIS — N182 Chronic kidney disease, stage 2 (mild): Secondary | ICD-10-CM | POA: Diagnosis not present

## 2017-08-02 DIAGNOSIS — I129 Hypertensive chronic kidney disease with stage 1 through stage 4 chronic kidney disease, or unspecified chronic kidney disease: Secondary | ICD-10-CM | POA: Diagnosis not present

## 2017-08-02 DIAGNOSIS — R809 Proteinuria, unspecified: Secondary | ICD-10-CM | POA: Diagnosis not present

## 2017-08-05 ENCOUNTER — Other Ambulatory Visit: Payer: Self-pay | Admitting: Internal Medicine

## 2017-08-05 MED ORDER — DULAGLUTIDE 1.5 MG/0.5ML ~~LOC~~ SOAJ: SUBCUTANEOUS | 0 refills | Status: DC

## 2017-08-06 ENCOUNTER — Other Ambulatory Visit: Payer: Self-pay | Admitting: Nephrology

## 2017-08-06 DIAGNOSIS — E785 Hyperlipidemia, unspecified: Secondary | ICD-10-CM

## 2017-08-06 DIAGNOSIS — E119 Type 2 diabetes mellitus without complications: Secondary | ICD-10-CM

## 2017-08-06 DIAGNOSIS — M791 Myalgia, unspecified site: Secondary | ICD-10-CM

## 2017-08-06 DIAGNOSIS — R809 Proteinuria, unspecified: Secondary | ICD-10-CM

## 2017-08-06 DIAGNOSIS — N182 Chronic kidney disease, stage 2 (mild): Secondary | ICD-10-CM

## 2017-08-06 DIAGNOSIS — I10 Essential (primary) hypertension: Secondary | ICD-10-CM

## 2017-08-11 ENCOUNTER — Encounter: Payer: Self-pay | Admitting: Family

## 2017-08-15 ENCOUNTER — Ambulatory Visit
Admission: RE | Admit: 2017-08-15 | Discharge: 2017-08-15 | Disposition: A | Discharge status: Home / Self Care | Payer: 59 | Source: Ambulatory Visit | Attending: Nephrology | Admitting: Nephrology

## 2017-08-15 DIAGNOSIS — E119 Type 2 diabetes mellitus without complications: Secondary | ICD-10-CM | POA: Diagnosis not present

## 2017-08-15 DIAGNOSIS — R809 Proteinuria, unspecified: Secondary | ICD-10-CM

## 2017-08-15 DIAGNOSIS — H2513 Age-related nuclear cataract, bilateral: Secondary | ICD-10-CM | POA: Diagnosis not present

## 2017-08-15 DIAGNOSIS — I10 Essential (primary) hypertension: Secondary | ICD-10-CM

## 2017-08-15 DIAGNOSIS — N182 Chronic kidney disease, stage 2 (mild): Secondary | ICD-10-CM

## 2017-08-15 DIAGNOSIS — N189 Chronic kidney disease, unspecified: Secondary | ICD-10-CM | POA: Diagnosis not present

## 2017-08-15 DIAGNOSIS — E785 Hyperlipidemia, unspecified: Secondary | ICD-10-CM

## 2017-08-15 DIAGNOSIS — M791 Myalgia, unspecified site: Secondary | ICD-10-CM

## 2017-08-31 ENCOUNTER — Encounter: Payer: Self-pay | Admitting: Internal Medicine

## 2017-09-16 ENCOUNTER — Encounter: Payer: Self-pay | Admitting: Internal Medicine

## 2017-09-16 ENCOUNTER — Ambulatory Visit: Payer: 59 | Admitting: Internal Medicine

## 2017-09-16 VITALS — BP 126/74 | HR 78 | Ht 65.0 in | Wt 320.4 lb

## 2017-09-16 DIAGNOSIS — E282 Polycystic ovarian syndrome: Secondary | ICD-10-CM | POA: Diagnosis not present

## 2017-09-16 DIAGNOSIS — E1165 Type 2 diabetes mellitus with hyperglycemia: Secondary | ICD-10-CM

## 2017-09-16 DIAGNOSIS — E785 Hyperlipidemia, unspecified: Secondary | ICD-10-CM | POA: Diagnosis not present

## 2017-09-16 LAB — POCT GLYCOSYLATED HEMOGLOBIN (HGB A1C): Hemoglobin A1C: 8.5

## 2017-09-16 MED ORDER — EMPAGLIFLOZIN 10 MG PO TABS: 10.0000 mg | ORAL_TABLET | Freq: Every day | ORAL | 11 refills | Status: DC

## 2017-09-16 NOTE — Patient Instructions (Addendum)
Please continue: - Metformin 850 mg 2x a day  - Trulicity 1.5 mg weekly - Levemir 50 units in am  - Novolog - smaller meal: 15 units  - large meal: 25 units  - NovoLog SSI:  - 150- 165: + 2 unit  - 166- 180: + 3 units  - 181- 195: + 4 units  - 196- 210: + 5 units  - 211- 225: + 6 units  - 226- 240: + 7 units  - 241- 255: + 8 units  - >255: + 9 units  Please start CoQ10 daily.  Please stop potassium.  Start Jardiance 10 mg daily before b'fast.  Please return in 3 months with your sugar log.

## 2017-09-16 NOTE — Progress Notes (Signed)
Patient ID: Audrey Goodman, female   DOB: 1970-12-13, 47 y.o.   MRN: [id number]  HPI: Audrey Goodman is a 47 y.o.-year-old female, returning for f/u for DM2, dx with GDM in 1995, then DM 6 mo after son was born, insulin-dependent since ~2010, uncontrolled, without complications and PCOS. Last visit 6 months ago.  Before last visit, she started Wellbutrin ER  and she started to feel better.  However, she has multiple stressors in her life right now, including being affected and filing for bankruptcy.  She did not check sugars lately.  She also had dietary indiscretions.  Last hemoglobin A1c was: Lab Results  Component Value Date   HGBA1C 8 04/23/2017   HGBA1C 8.1 06/18/2016   HGBA1C 7.9 07/27/2015   Pt is on a regimen of: - Levemir 50 units in am  - Novolog insulin with meals as follows: - smaller meal: 20 >> 15 units  - large meal: 30 >> 25 units  - NovoLog SSI:  - 150- 165: + 2 unit  - 166- 180: + 3 units  - 181- 195: + 4 units  - 196- 210: + 5 units  - 211- 225: + 6 units  - 226- 240: + 7 units  - 241- 255: + 8 units  - >255: + 9 units - Trulicity 0.75 mg weekly  - started 05/2016 >> 1.5 mg weekly  Pt does checks her sugars now. Prev.: - am:  120-140,184 >> 120-150  - 2h after b'fast: n/c - before lunch: 79 x1 >> 130-140 - 2h after lunch: n/c >> 113 >> n/c - before dinner:  130s >> 150-160 - 2h after dinner: n/c - bedtime: n/c >> 150s >> n/c - nighttime: n/c Lowest sugar was  69 (ate a small dinner) >> ?; she has hypoglycemia awareness in the 80s.  Highest sugar was 273 (missed insulin) >> 180 >> 250.  -+ CKD, last BUN/creatinine:  08/02/2017: BUN/creatinine 13/0.87, GFR 93, potassium 5.3 (3.5-5.2) Lab Results  Component Value Date   BUN 16 03/08/2017   CREATININE 1.02 03/08/2017  Off lisinopril. She had increased urinary proteins recently >> now seeing nephrology. Component     Latest Ref Rng & Units 03/28/2017  Creatinine, Urine     20 - 275 mg/dL 409   Protein/Creat Ratio     21 - 161 mg/g creat 180 (H)  Total Protein, Urine     5 - 24 mg/dL 34 (H)  Albumin     % 63  Alpha-1-Globulin, U     % 6  ALPHA-2-GLOBULIN, U     % 8  Beta Globulin, U     % 12  Gamma Globulin, U     % 11  Interpretation        Prot,24hr calculated     0 - 149 mg/24 h 480 (H)   - + HL; last set of lipids: Lab Results  Component Value Date   CHOL 227 (H) 03/08/2017   HDL 81 03/08/2017   LDLCALC 114 (H) 03/08/2017   TRIG 202 (H) 03/08/2017   CHOLHDL 2.8 03/08/2017  Off Zetia.  Continues to have leg cramps - Crestor 10.  We started magnesium since last visit, but not helping that much.  She is already on potassium. - last eye exam was: 06/2016: No DR. Dr. Dione Booze. -Denies numbness and tingling in her feet.  She could not afford gastric bypass surgery.  PCOS: -+ Irregular menses since menarche -  +1 pregnancy, no miscarriages -  was on Mirena IUD - for ~4.5 years >> started Sprintec in 2016.  No side effects>> no SEs - + significant hirsutism >> this is helped by spironolactone 50 mg twice daily - o continues on metformin 850 mg twice daily without side effects  Last K level was normal: Lab Results  Component Value Date   K 4.4 03/08/2017   ROS: Constitutional: no weight gain/no weight loss, no fatigue, no subjective hyperthermia, no subjective hypothermia Eyes: no blurry vision, no xerophthalmia ENT: no sore throat, no nodules palpated in throat, no dysphagia, no odynophagia, no hoarseness Cardiovascular: no CP/no SOB/no palpitations/no leg swelling Respiratory: no cough/no SOB/no wheezing Gastrointestinal: no N/no V/no D/no C/no acid reflux Musculoskeletal: + muscle aches/no joint aches Skin: no rashes, no hair loss Neurological: no tremors/no numbness/no tingling/no dizziness  I reviewed pt's medications, allergies, PMH, social hx, family hx, and changes were documented in the history of present illness. Otherwise, unchanged from my  initial visit note.  PE: BP 126/74   Pulse 78   Ht 5\' 5"  (1.651 m)   Wt (!) 320 lb 6.4 oz (145.3 kg)   SpO2 98%   BMI 53.32 kg/m  Wt Readings from Last 3 Encounters:  09/16/17 (!) 320 lb 6.4 oz (145.3 kg)  06/18/17 (!) 320 lb (145.2 kg)  06/14/17 (!) 323 lb (146.5 kg)   Constitutional: overweight, in NAD Eyes: PERRLA, EOMI, no exophthalmos ENT: moist mucous membranes, no thyromegaly, no cervical lymphadenopathy Cardiovascular: RRR, No MRG Respiratory: CTA B Gastrointestinal: abdomen soft, NT, ND, BS+ Musculoskeletal: no deformities, strength intact in all 4 Skin: moist, warm,  ++ hirsutism on chin and chest; no acne Neurological: no tremor with outstretched hands, DTR normal in all 4  ASSESSMENT: 1. DM2, insulin-dependent, uncontrolled, without complications  2. PCOS Component     Latest Ref Rng 10/16/2013  Testosterone     10 - 70 ng/dL 54  Sex Hormone Binding     18 - 114 nmol/L 12 (L)  Testosterone Free     0.6 - 6.8 pg/mL 15.5 (H)  Testosterone-% Free     0.4 - 2.4 % 2.9 (H)  TSH     0.35 - 4.50 uIU/mL 0.54  17-OH-Progesterone, LC/MS/MS      <8  Free T4     0.60 - 1.60 ng/dL 1.610.84  T3, Free     2.3 - 4.2 pg/mL 3.0   Labs confirmed PCOS (high testosterone, low 17-HO Prog, normal TFTs).  LH and FSH were not tested as she was on the Mirena IUD.  3. HL  PLAN:  1. DM2 - Patient with long-standing, uncontrolled, diabetes, on basal-bolus insulin regimen, with metformin and also GLP-1 receptor agonist.  At last visit, she was off metformin for 2 months while she was on a diet.  At that time, since sugars were above target, we restarted metformin.  We did not change her insulin regimen and we also continue Trulicity.  I gave her references about improving her diet. - Since last visit, she did not check her sugars as she had a lot of stress in her life.  She also had dietary indiscretions. - today, HbA1c is 8.5% (higher) - Discussed about the importance of starting  checking sugars - I also suggested to add an SGLT2 inhibitor, low-dose Jardiance, while stopping the potassium as latest potassium level was slightly elevated - I advised her to return in 3 to 4 weeks for another BMP -  I suggested to:  Patient Instructions  Please  continue: - Metformin 850 mg 2x a day  - Trulicity 1.5 mg weekly - Levemir 50 units in am  - Novolog - smaller meal: 15 units  - large meal: 25 units  - NovoLog SSI:  - 150- 165: + 2 unit  - 166- 180: + 3 units  - 181- 195: + 4 units  - 196- 210: + 5 units  - 211- 225: + 6 units  - 226- 240: + 7 units  - 241- 255: + 8 units  - >255: + 9 units  Please start CoQ10 daily.  Please stop potassium.  Start Jardiance 10 mg daily before b'fast.  Please return in 3 months with your sugar log.   -Start checking sugars at different times of the day - check 3x a day, rotating checks - advised for yearly eye exams >> she is not UTD - Return to clinic in 3 mo with sugar log   2. PCOS -Patient with clinically evident PCOS, confirmed biochemically in 2015 -She had an IUD before, this was removed in 2016 and she was started on Ortho-Cyclen OCPs.   -Also continue metformin 850 mg twice a day and Spironolactone 50 mg twice a day.  Latest potassium was reviewed and this was slightly high in 07/2017, so we are stopping her potassium supplement.  3. HL - Reviewed latest lipid panel from 02/2017 >> LDL above goal - Continues the statin >> muscle cramps.   - Reviewed recent CK level from nephrology appointment in 07/2017: Normal - We will start co-Q10  Carlus Pavlov, MD PhD [facility] Endocrinology

## 2017-09-18 ENCOUNTER — Other Ambulatory Visit: Payer: Self-pay | Admitting: Family

## 2017-09-30 ENCOUNTER — Ambulatory Visit: Payer: 59 | Admitting: Family

## 2017-09-30 DIAGNOSIS — Z0289 Encounter for other administrative examinations: Secondary | ICD-10-CM

## 2017-10-01 ENCOUNTER — Encounter: Payer: Self-pay | Admitting: Family

## 2017-10-04 ENCOUNTER — Other Ambulatory Visit: Payer: Self-pay | Admitting: Family

## 2017-10-09 ENCOUNTER — Other Ambulatory Visit: Payer: Self-pay | Admitting: Internal Medicine

## 2017-10-09 ENCOUNTER — Other Ambulatory Visit: Payer: 59

## 2017-10-09 ENCOUNTER — Other Ambulatory Visit: Payer: Self-pay

## 2017-10-09 DIAGNOSIS — E1165 Type 2 diabetes mellitus with hyperglycemia: Secondary | ICD-10-CM | POA: Diagnosis not present

## 2017-10-09 LAB — BASIC METABOLIC PANEL WITH GFR
BUN: 17 mg/dL (ref 7–25)
CO2: 25 mmol/L (ref 20–32)
Calcium: 9.6 mg/dL (ref 8.6–10.2)
Chloride: 99 mmol/L (ref 98–110)
Creat: 0.98 mg/dL (ref 0.50–1.10)
GFR, EST AFRICAN AMERICAN: 81 mL/min/{1.73_m2} (ref >=60)
GFR, EST NON AFRICAN AMERICAN: 70 mL/min/{1.73_m2} (ref >=60)
Glucose, Bld: 190 mg/dL — ABNORMAL HIGH (ref 65–99)
Potassium: 5.3 mmol/L (ref 3.5–5.3)
SODIUM: 135 mmol/L (ref 135–146)

## 2017-10-09 MED ORDER — GLUCOSE BLOOD VI STRP: ORAL_STRIP | 11 refills | Status: DC

## 2017-10-10 ENCOUNTER — Other Ambulatory Visit: Payer: Self-pay

## 2017-10-10 MED ORDER — ACCU-CHEK GUIDE W/DEVICE KIT: 1.0000 | PACK | 1 refills | Status: DC

## 2017-10-10 MED ORDER — GLUCOSE BLOOD VI STRP: ORAL_STRIP | 5 refills | Status: DC

## 2017-10-23 DIAGNOSIS — Z803 Family history of malignant neoplasm of breast: Secondary | ICD-10-CM | POA: Diagnosis not present

## 2017-10-23 DIAGNOSIS — Z800 Family history of malignant neoplasm of digestive organs: Secondary | ICD-10-CM | POA: Diagnosis not present

## 2017-10-23 DIAGNOSIS — Z6841 Body Mass Index (BMI) 40.0 and over, adult: Secondary | ICD-10-CM | POA: Diagnosis not present

## 2017-10-23 DIAGNOSIS — Z01419 Encounter for gynecological examination (general) (routine) without abnormal findings: Secondary | ICD-10-CM | POA: Diagnosis not present

## 2017-10-23 DIAGNOSIS — Z8042 Family history of malignant neoplasm of prostate: Secondary | ICD-10-CM | POA: Diagnosis not present

## 2017-10-23 LAB — HM MAMMOGRAPHY

## 2017-10-23 LAB — HM PAP SMEAR: HM Pap smear: NEGATIVE

## 2017-10-24 ENCOUNTER — Encounter: Payer: Self-pay | Admitting: Internal Medicine

## 2017-10-24 MED ORDER — INSULIN PEN NEEDLE 32G X 4 MM MISC: 3 refills | Status: DC

## 2017-10-25 ENCOUNTER — Other Ambulatory Visit: Payer: Self-pay | Admitting: Internal Medicine

## 2017-11-06 ENCOUNTER — Other Ambulatory Visit: Payer: Self-pay | Admitting: Internal Medicine

## 2017-11-23 ENCOUNTER — Other Ambulatory Visit: Payer: Self-pay | Admitting: Internal Medicine

## 2017-12-04 ENCOUNTER — Other Ambulatory Visit: Payer: Self-pay | Admitting: Internal Medicine

## 2017-12-04 MED ORDER — SPIRONOLACTONE 50 MG PO TABS: 50.0000 mg | ORAL_TABLET | Freq: Two times a day (BID) | ORAL | 0 refills | Status: DC

## 2017-12-20 ENCOUNTER — Ambulatory Visit: Payer: 59 | Admitting: Internal Medicine

## 2017-12-26 DIAGNOSIS — N760 Acute vaginitis: Secondary | ICD-10-CM | POA: Diagnosis not present

## 2018-01-19 ENCOUNTER — Telehealth: Payer: Self-pay | Admitting: Family

## 2018-01-20 NOTE — Telephone Encounter (Signed)
Pt requesting refill of crestor. Last OV was 06/14/17.  Please advise?

## 2018-01-20 NOTE — Telephone Encounter (Signed)
30 day supply sent. Needs OV prior to additional refills please.

## 2018-01-28 NOTE — Telephone Encounter (Signed)
Called pt and LVM regarding her Rx refill request. Informed pt that a 30 day supply was sent in, but she would need an office visit before and further refills could be sent. Advised pt to call and schedule an appt at her earliest convenience.

## 2018-02-02 NOTE — Progress Notes (Signed)
Subjective:    Patient ID: Audrey Goodman, female    DOB: Jan 19, 1971, 47 y.o.   MRN: [id number]  HPI   Patient is a 47 year old female who presents today for routine follow-up  Hyperlipidemia-patient is maintained on Crestor. Denies myalgias.  Lab Results  Component Value Date   CHOL 227 (H) 03/08/2017   HDL 81 03/08/2017   LDLCALC 114 (H) 03/08/2017   TRIG 202 (H) 03/08/2017   CHOLHDL 2.8 03/08/2017   Diabetes type 2-this is being managed by endocrinology. Reports sugars have been running in the high 100's.  Diet has not been great.   Lab Results  Component Value Date   HGBA1C 8.5 09/16/2017   HGBA1C 8 04/23/2017   HGBA1C 8.1 06/18/2016   Lab Results  Component Value Date   LDLCALC 114 (H) 03/08/2017   CREATININE 0.98 10/09/2017   History of depression-she was previously maintained on Wellbutrin. She stopped >3 months ago. Felt better while she was on it but did not want to become dependent on the medication. Admits that she felt better while on the medication and did better with her diabetes and "other things I should be doing." Noticed that it helped her with he appetite as well.    Review of Systems See HPI  Past Medical History:  Diagnosis Date  . Arthritis    left shoulder  . Depression   . Diabetes mellitus without complication ([place])    type 2  . Diverticulitis   . High cholesterol   . History of chicken pox   . Hyperlipidemia   . Hypertension   . Shingles   . Sleep apnea    uses CPAP nightly  . SVD (spontaneous vaginal delivery) 1995   x 1  . Type 2 diabetes mellitus, uncontrolled ([place]) 07/30/2006   Qualifier: Diagnosis of  By: Drue Flirt  MD, Merrily Brittle       Social History   Socioeconomic History  . Marital status: Single    Spouse name: Not on file  . Number of children: Not on file  . Years of education: Not on file  . Highest education level: Not on file  Occupational History  . Not on file  Social Needs  . Financial resource strain: Not on  file  . Food insecurity:    Worry: Not on file    Inability: Not on file  . Transportation needs:    Medical: Not on file    Non-medical: Not on file  Tobacco Use  . Smoking status: Never Smoker  . Smokeless tobacco: Never Used  Substance and Sexual Activity  . Alcohol use: No    Alcohol/week: 0.0 standard drinks  . Drug use: No  . Sexual activity: Yes    Birth control/protection: Pill  Lifestyle  . Physical activity:    Days per week: Not on file    Minutes per session: Not on file  . Stress: Not on file  Relationships  . Social connections:    Talks on phone: Not on file    Gets together: Not on file    Attends religious service: Not on file    Active member of club or organization: Not on file    Attends meetings of clubs or organizations: Not on file    Relationship status: Not on file  . Intimate partner violence:    Fear of current or ex partner: Not on file    Emotionally abused: Not on file    Physically abused: Not on file  Forced sexual activity: Not on file  Other Topics Concern  . Not on file  Social History Narrative   Single- lives with boyfriend.   Has a boyfriend   Son 22- senior at [employer]   Enjoys reading   Works as a Marketing executive at [employer]     Past Surgical History:  Procedure Laterality Date  . [place]   right  . CHOLECYSTECTOMY  2008  . HYSTEROSCOPY N/A 09/13/2014   Procedure: HYSTEROSCOPY with IUD removal;  Surgeon: Linda Hedges, DO;  Location: [facility];  Service: Gynecology;  Laterality: N/A;  need longest speculum and instruments that aresavailable due to patient's habitus (BMI ~60)    Family History  Problem Relation Age of Onset  . Cancer Mother        breast  . Hyperlipidemia Mother   . Heart disease Mother   . Hypertension Mother   . Mental illness Mother        depression  . Diabetes Mother   . Hyperlipidemia Father   . Stroke Father   . Hypertension Father   . Cancer Maternal Grandmother         colon  . Diabetes Maternal Grandmother   . Diabetes Maternal Uncle   . Diabetes Maternal Aunt   . Diabetes Maternal Uncle     Allergies  Allergen Reactions  . Lisinopril Swelling    Swelling of the left side of tongue  . Lipitor [Atorvastatin] Other (See Comments)    Myalgia/foot cramping    Current Outpatient Medications on File Prior to Visit  Medication Sig Dispense Refill  . Blood Glucose Monitoring Suppl (ACCU-CHEK GUIDE) w/Device KIT 1 Device by Does not apply route as directed. 1 kit 1  . glucose blood (ACCU-CHEK GUIDE) test strip Use to test blood sugar 3 times daily 100 each 5  . glucose blood (ONE TOUCH ULTRA TEST) test strip USE TO TEST BLOOD SUGAR 3 TIMES DAILY AS INSTRUCTED 300 each 11  . insulin aspart (NOVOLOG FLEXPEN) 100 UNIT/ML FlexPen INJECT 15-30 UNITS INTO THE SKIN 3 TIMES DAILY BEFORE MEALS. 105 mL 3  . Insulin Pen Needle 32G X 4 MM MISC Use to inject insulin 4 times daily 400 each 3  . JARDIANCE 10 MG TABS tablet TAKE 1 TABLET BY MOUTH EVERY DAY 90 tablet 4  . LEVEMIR FLEXTOUCH 100 UNIT/ML Pen INJECT 50 UNITS SUBCUTANEOUSLY DAILY AT 10PM 45 mL 3  . metFORMIN (GLUCOPHAGE) 850 MG tablet Take 1 tablet (850 mg total) by mouth 2 (two) times daily with a meal. 180 tablet 3  . norgestimate-ethinyl estradiol (ORTHO-CYCLEN,SPRINTEC,PREVIFEM) 0.25-35 MG-MCG tablet Take 1 tablet by mouth daily.    Marland Kitchen NOVOLOG FLEXPEN 100 UNIT/ML FlexPen INJECT 28 TO 35 UNITS INTO THE SKIN 3 TIMES DAILY BEFORE MEALS. 105 pen 0  . polyethylene glycol powder (GLYCOLAX/MIRALAX) powder Take 17 g by mouth 2 (two) times daily. 255 g 0  . spironolactone (ALDACTONE) 50 MG tablet Take 1 tablet (50 mg total) by mouth 2 (two) times daily. 875 tablet 0  . TRULICITY 1.5 IE/3.3IR SOPN INJECT 0.5ML ONCE WEEKLY UNDER THE SKIN 6 pen 0   No current facility-administered medications on file prior to visit.     BP 132/78 (BP Location: Right Arm) Comment (Cuff Size): thigh cuff  Pulse 76   Temp 99.5 F (37.5  C) (Oral)   Resp 18   Wt (!) 323 lb 12.8 oz (146.9 kg)   SpO2 99%   BMI 53.88 kg/m  Objective:   Physical Exam  Constitutional: She appears well-developed and well-nourished.  Cardiovascular: Normal rate, regular rhythm and normal heart sounds.  No murmur heard. Pulmonary/Chest: Effort normal and breath sounds normal. No respiratory distress. She has no wheezes.  Psychiatric: She has a normal mood and affect. Her behavior is normal. Judgment and thought content normal.          Assessment & Plan:  DM2-uncontrolled. Obtain a1c, advised pt to arrange follow up with endo.   Hyperlipidemia- obtain lipid panel. Continue atorvastatin.  Depression- uncontrolled. Will restart wellbutrin.  Flu shot today.

## 2018-02-03 ENCOUNTER — Ambulatory Visit: Payer: 59 | Admitting: Family

## 2018-02-03 ENCOUNTER — Encounter: Payer: Self-pay | Admitting: Family

## 2018-02-03 VITALS — BP 132/78 | HR 76 | Temp 99.5°F | Resp 18 | Wt 323.8 lb

## 2018-02-03 DIAGNOSIS — Z23 Encounter for immunization: Secondary | ICD-10-CM | POA: Diagnosis not present

## 2018-02-03 DIAGNOSIS — E1165 Type 2 diabetes mellitus with hyperglycemia: Secondary | ICD-10-CM

## 2018-02-03 DIAGNOSIS — E785 Hyperlipidemia, unspecified: Secondary | ICD-10-CM | POA: Diagnosis not present

## 2018-02-03 DIAGNOSIS — F32A Depression, unspecified: Secondary | ICD-10-CM

## 2018-02-03 DIAGNOSIS — F329 Major depressive disorder, single episode, unspecified: Secondary | ICD-10-CM | POA: Diagnosis not present

## 2018-02-03 LAB — LIPID PANEL
CHOLESTEROL: 155 mg/dL (ref 0–200)
HDL: 58 mg/dL (ref >39.00)
LDL Cholesterol: 71 mg/dL (ref 0–99)
NONHDL: 97.49
Total CHOL/HDL Ratio: 3
Triglycerides: 133 mg/dL (ref 0.0–149.0)
VLDL: 26.6 mg/dL (ref 0.0–40.0)

## 2018-02-03 LAB — HEMOGLOBIN A1C: HEMOGLOBIN A1C: 8.7 % — AB (ref 4.6–6.5)

## 2018-02-03 LAB — BASIC METABOLIC PANEL
BUN: 16 mg/dL (ref 6–23)
CO2: 23 mEq/L (ref 19–32)
Calcium: 9.1 mg/dL (ref 8.4–10.5)
Chloride: 103 mEq/L (ref 96–112)
Creatinine, Ser: 0.99 mg/dL (ref 0.40–1.20)
GFR: 77.59 mL/min (ref >60.00)
Glucose, Bld: 162 mg/dL — ABNORMAL HIGH (ref 70–99)
Potassium: 4.3 mEq/L (ref 3.5–5.1)
SODIUM: 136 meq/L (ref 135–145)

## 2018-02-03 MED ORDER — ROSUVASTATIN CALCIUM 10 MG PO TABS: 10.0000 mg | ORAL_TABLET | Freq: Every day | ORAL | 1 refills | Status: DC

## 2018-02-03 MED ORDER — BUPROPION HCL ER (XL) 150 MG PO TB24: 150.0000 mg | ORAL_TABLET | Freq: Every day | ORAL | 1 refills | Status: DC

## 2018-02-03 NOTE — Patient Instructions (Signed)
Please restart wellutrin. Complete lab work prior to leaving.

## 2018-02-03 NOTE — Addendum Note (Signed)
Addended by: Mervin Kung A on: 02/03/2018 09:47 AM   Modules accepted: Orders

## 2018-02-17 DIAGNOSIS — N939 Abnormal uterine and vaginal bleeding, unspecified: Secondary | ICD-10-CM | POA: Diagnosis not present

## 2018-02-17 DIAGNOSIS — E669 Obesity, unspecified: Secondary | ICD-10-CM | POA: Diagnosis not present

## 2018-02-17 DIAGNOSIS — N926 Irregular menstruation, unspecified: Secondary | ICD-10-CM | POA: Diagnosis not present

## 2018-03-19 ENCOUNTER — Ambulatory Visit: Payer: 59 | Admitting: Family

## 2018-03-19 DIAGNOSIS — Z0289 Encounter for other administrative examinations: Secondary | ICD-10-CM

## 2018-03-27 ENCOUNTER — Encounter: Payer: Self-pay | Admitting: Family

## 2018-03-28 ENCOUNTER — Encounter: Payer: Self-pay | Admitting: Family

## 2018-04-01 ENCOUNTER — Encounter: Payer: Self-pay | Admitting: Family

## 2018-04-02 ENCOUNTER — Ambulatory Visit: Payer: 59 | Admitting: Family

## 2018-04-02 VITALS — BP 142/65 | HR 80 | Temp 98.8°F | Resp 18 | Ht 65.0 in | Wt 326.8 lb

## 2018-04-02 DIAGNOSIS — F418 Other specified anxiety disorders: Secondary | ICD-10-CM

## 2018-04-02 MED ORDER — FLUOXETINE HCL 10 MG PO TABS: ORAL_TABLET | ORAL | 0 refills | Status: DC

## 2018-04-02 MED ORDER — BUPROPION HCL 75 MG PO TABS: ORAL_TABLET | ORAL | 0 refills | Status: DC

## 2018-04-02 NOTE — Progress Notes (Signed)
Subjective:    Patient ID: Audrey Goodman, female    DOB: Sep 18, 1970, 47 y.o.   MRN: [id number]  HPI  Patient is a 47 year old female who presents today for follow-up.  [place] visit depression symptoms were noted to be uncontrolled.  We restarted her Wellbutrin. Reports that her mind seems to race at night and finds it difficult to fall asleep.  Notes that she is able to get up and motivate in the AM.  Has work/financial issues which are ongoing. Some support. She is not working with a Social worker.    Reports low libido x 1 month.  "this is becoming an issue."   Reports that she slipped in the tub 6 weeks ago and hit her right shin. Area is still discolored and tissue has not returned to normal.   Review of Systems See HPI  Past Medical History:  Diagnosis Date  . Arthritis    left shoulder  . Depression   . Diabetes mellitus without complication ([place])    type 2  . Diverticulitis   . High cholesterol   . History of chicken pox   . Hyperlipidemia   . Hypertension   . Shingles   . Sleep apnea    uses CPAP nightly  . SVD (spontaneous vaginal delivery) 1995   x 1  . Type 2 diabetes mellitus, uncontrolled ([place]) 07/30/2006   Qualifier: Diagnosis of  By: Drue Flirt  MD, Merrily Brittle       Social History   Socioeconomic History  . Marital status: Single    Spouse name: Not on file  . Number of children: Not on file  . Years of education: Not on file  . Highest education level: Not on file  Occupational History  . Not on file  Social Needs  . Financial resource strain: Not on file  . Food insecurity:    Worry: Not on file    Inability: Not on file  . Transportation needs:    Medical: Not on file    Non-medical: Not on file  Tobacco Use  . Smoking status: Never Smoker  . Smokeless tobacco: Never Used  Substance and Sexual Activity  . Alcohol use: No    Alcohol/week: 0.0 standard drinks  . Drug use: No  . Sexual activity: Yes    Birth control/protection: Pill    Lifestyle  . Physical activity:    Days per week: Not on file    Minutes per session: Not on file  . Stress: Not on file  Relationships  . Social connections:    Talks on phone: Not on file    Gets together: Not on file    Attends religious service: Not on file    Active member of club or organization: Not on file    Attends meetings of clubs or organizations: Not on file    Relationship status: Not on file  . Intimate partner violence:    Fear of current or ex partner: Not on file    Emotionally abused: Not on file    Physically abused: Not on file    Forced sexual activity: Not on file  Other Topics Concern  . Not on file  Social History Narrative   Single- lives with boyfriend.   Has a boyfriend   Son 26- senior at [employer]   Enjoys reading   Works as a Marketing executive at [employer]     Past Surgical History:  Procedure Laterality Date  . [place]  right  . CHOLECYSTECTOMY  2008  . HYSTEROSCOPY N/A 09/13/2014   Procedure: HYSTEROSCOPY with IUD removal;  Surgeon: Linda Hedges, DO;  Location: [facility];  Service: Gynecology;  Laterality: N/A;  need longest speculum and instruments that aresavailable due to patient's habitus (BMI ~60)    Family History  Problem Relation Age of Onset  . Cancer Mother        breast  . Hyperlipidemia Mother   . Heart disease Mother   . Hypertension Mother   . Mental illness Mother        depression  . Diabetes Mother   . Hyperlipidemia Father   . Stroke Father   . Hypertension Father   . Cancer Maternal Grandmother        colon  . Diabetes Maternal Grandmother   . Diabetes Maternal Uncle   . Diabetes Maternal Aunt   . Diabetes Maternal Uncle     Allergies  Allergen Reactions  . Lisinopril Swelling    Swelling of the left side of tongue  . Lipitor [Atorvastatin] Other (See Comments)    Myalgia/foot cramping    Current Outpatient Medications on File Prior to Visit  Medication Sig Dispense Refill  .  Blood Glucose Monitoring Suppl (ACCU-CHEK GUIDE) w/Device KIT 1 Device by Does not apply route as directed. 1 kit 1  . buPROPion (WELLBUTRIN XL) 150 MG 24 hr tablet Take 1 tablet (150 mg total) by mouth daily. 90 tablet 1  . glucose blood (ACCU-CHEK GUIDE) test strip Use to test blood sugar 3 times daily 100 each 5  . Insulin Pen Needle 32G X 4 MM MISC Use to inject insulin 4 times daily 400 each 3  . JARDIANCE 10 MG TABS tablet TAKE 1 TABLET BY MOUTH EVERY DAY 90 tablet 4  . LEVEMIR FLEXTOUCH 100 UNIT/ML Pen INJECT 50 UNITS SUBCUTANEOUSLY DAILY AT 10PM 45 mL 3  . metFORMIN (GLUCOPHAGE) 850 MG tablet Take 1 tablet (850 mg total) by mouth 2 (two) times daily with a meal. 180 tablet 3  . norgestimate-ethinyl estradiol (ORTHO-CYCLEN,SPRINTEC,PREVIFEM) 0.25-35 MG-MCG tablet Take 1 tablet by mouth daily.    Marland Kitchen NOVOLOG FLEXPEN 100 UNIT/ML FlexPen INJECT 28 TO 35 UNITS INTO THE SKIN 3 TIMES DAILY BEFORE MEALS. 105 pen 0  . polyethylene glycol powder (GLYCOLAX/MIRALAX) powder Take 17 g by mouth 2 (two) times daily. 255 g 0  . rosuvastatin (CRESTOR) 10 MG tablet Take 1 tablet (10 mg total) by mouth daily. 90 tablet 1  . spironolactone (ALDACTONE) 50 MG tablet Take 1 tablet (50 mg total) by mouth 2 (two) times daily. 009 tablet 0  . TRULICITY 1.5 FG/1.8EX SOPN INJECT 0.5ML ONCE WEEKLY UNDER THE SKIN 6 pen 0   No current facility-administered medications on file prior to visit.     BP (!) 142/65 (BP Location: Left Arm, Patient Position: Sitting, Cuff Size: Large)   Pulse 80   Temp 98.8 F (37.1 C) (Oral)   Resp 18   Ht 5' 5"  (1.651 m)   Wt (!) 326 lb 12.8 oz (148.2 kg)   SpO2 99%   BMI 54.38 kg/m       Objective:   Physical Exam  Constitutional: She is oriented to person, place, and time. She appears well-developed and well-nourished.  Cardiovascular: Normal rate, regular rhythm and normal heart sounds.  No murmur heard. Pulmonary/Chest: Effort normal and breath sounds normal. No respiratory  distress. She has no wheezes.  Musculoskeletal: She exhibits no edema.  Some irregular scar tissue noted  on right shin with mild skin discoloration.  Tender.  Neurological: She is alert and oriented to person, place, and time.  Psychiatric: She has a normal mood and affect. Her behavior is normal. Judgment and thought content normal.          Assessment & Plan:  Depression/anxiety-depression is improved however she continues to have some anxiety issues particularly in the evenings.  She is also having decreased libido which I suspect is related to addition of Wellbutrin.  I have advised the patient as follows:  Please change wellbutrin to 17m 1/2 tab twice daily for 1 week, the 1/2 tab once daily on week two x 1 week then stop. Start prozac 163m1/2 tab once daily for 1 week, then increase to a full tab once daily on week two. Pt advised to call if she has any issues with mood changes during this transition period. Suggested an appointment with a counselor to help your anxiety and depression.  She was given contact information for [facility]  A total of 15  minutes were spent face-to-face with the patient during this encounter and over half of that time was spent on counseling and coordination of care. The patient was counseled on depression and anxiety and treatment of these conditions.

## 2018-04-02 NOTE — Patient Instructions (Signed)
Please change wellbutrin to 75mg  1/2 tab twice daily for 1 week, the 1/2 tab once daily on week two x 1 week then stop. Start prozac 10mg  1/2 tab once daily for 1 week, then increase to a full tab once daily on week two.

## 2018-04-06 ENCOUNTER — Other Ambulatory Visit: Payer: Self-pay | Admitting: Internal Medicine

## 2018-04-23 ENCOUNTER — Telehealth: Payer: Self-pay

## 2018-04-23 NOTE — Telephone Encounter (Signed)
Received request from [facility] for 90-day supply of buproprion, but per PCP notes from 11/6, bupropion was a time-limited rx, with pt. starting prozac after buproprion finished. Request denied.

## 2018-04-25 ENCOUNTER — Other Ambulatory Visit: Payer: Self-pay | Admitting: Family

## 2018-05-05 ENCOUNTER — Encounter: Payer: Self-pay | Admitting: Family

## 2018-05-05 ENCOUNTER — Ambulatory Visit: Payer: 59 | Admitting: Family

## 2018-05-05 VITALS — BP 138/62 | HR 77 | Temp 99.3°F | Resp 16 | Ht 65.0 in | Wt 321.0 lb

## 2018-05-05 DIAGNOSIS — H6120 Impacted cerumen, unspecified ear: Secondary | ICD-10-CM

## 2018-05-05 DIAGNOSIS — H6121 Impacted cerumen, right ear: Secondary | ICD-10-CM

## 2018-05-05 DIAGNOSIS — F32A Depression, unspecified: Secondary | ICD-10-CM

## 2018-05-05 DIAGNOSIS — F329 Major depressive disorder, single episode, unspecified: Secondary | ICD-10-CM | POA: Diagnosis not present

## 2018-05-05 MED ORDER — FLUOXETINE HCL 10 MG PO TABS: 10.0000 mg | ORAL_TABLET | Freq: Every day | ORAL | 1 refills | Status: DC

## 2018-05-05 NOTE — Patient Instructions (Addendum)
Please continue prozac once daily.  Watch your weight.

## 2018-05-05 NOTE — Progress Notes (Signed)
Subjective:    Patient ID: Audrey Goodman, female    DOB: 03-13-1971, 47 y.o.   MRN: [id number]  HPI  Patient is a 47 yr old female who presents today for follow up.  Depression- last visit we tapered off of wellbutrin and began prozac.  She reports that taper off her Wellbutrin was smooth.  She has been feeling better overall on the Prozac.  Does not feel like her mind has been racing as much at night.  She does note that she has had recent stress with the death in the family.  Wt Readings from Last 3 Encounters:  05/05/18 (!) 321 lb (145.6 kg)  04/02/18 (!) 326 lb 12.8 oz (148.2 kg)  02/03/18 (!) 323 lb 12.8 oz (146.9 kg)     R ear fullness-reports that she has had fullness in the right ear. Denies pain.    Review of Systems    see HPI  Past Medical History:  Diagnosis Date  . Arthritis    left shoulder  . Depression   . Diabetes mellitus without complication ([place])    type 2  . Diverticulitis   . High cholesterol   . History of chicken pox   . Hyperlipidemia   . Hypertension   . Shingles   . Sleep apnea    uses CPAP nightly  . SVD (spontaneous vaginal delivery) 1995   x 1  . Type 2 diabetes mellitus, uncontrolled ([place]) 07/30/2006   Qualifier: Diagnosis of  By: Drue Flirt  MD, Merrily Brittle       Social History   Socioeconomic History  . Marital status: Single    Spouse name: Not on file  . Number of children: Not on file  . Years of education: Not on file  . Highest education level: Not on file  Occupational History  . Not on file  Social Needs  . Financial resource strain: Not on file  . Food insecurity:    Worry: Not on file    Inability: Not on file  . Transportation needs:    Medical: Not on file    Non-medical: Not on file  Tobacco Use  . Smoking status: Never Smoker  . Smokeless tobacco: Never Used  Substance and Sexual Activity  . Alcohol use: No    Alcohol/week: 0.0 standard drinks  . Drug use: No  . Sexual activity: Yes    Birth  control/protection: Pill  Lifestyle  . Physical activity:    Days per week: Not on file    Minutes per session: Not on file  . Stress: Not on file  Relationships  . Social connections:    Talks on phone: Not on file    Gets together: Not on file    Attends religious service: Not on file    Active member of club or organization: Not on file    Attends meetings of clubs or organizations: Not on file    Relationship status: Not on file  . Intimate partner violence:    Fear of current or ex partner: Not on file    Emotionally abused: Not on file    Physically abused: Not on file    Forced sexual activity: Not on file  Other Topics Concern  . Not on file  Social History Narrative   Single- lives with boyfriend.   Has a boyfriend   Son 28- senior at [employer]   Enjoys reading   Works as a Marketing executive at [employer]     Past Surgical  History:  Procedure Laterality Date  . [place]   right  . CHOLECYSTECTOMY  2008  . HYSTEROSCOPY N/A 09/13/2014   Procedure: HYSTEROSCOPY with IUD removal;  Surgeon: Linda Hedges, DO;  Location: [facility];  Service: Gynecology;  Laterality: N/A;  need longest speculum and instruments that aresavailable due to patient's habitus (BMI ~60)    Family History  Problem Relation Age of Onset  . Cancer Mother        breast  . Hyperlipidemia Mother   . Heart disease Mother   . Hypertension Mother   . Mental illness Mother        depression  . Diabetes Mother   . Hyperlipidemia Father   . Stroke Father   . Hypertension Father   . Cancer Maternal Grandmother        colon  . Diabetes Maternal Grandmother   . Diabetes Maternal Uncle   . Diabetes Maternal Aunt   . Diabetes Maternal Uncle     Allergies  Allergen Reactions  . Lisinopril Swelling    Swelling of the left side of tongue  . Lipitor [Atorvastatin] Other (See Comments)    Myalgia/foot cramping    Current Outpatient Medications on File Prior to Visit  Medication Sig  Dispense Refill  . ACCU-CHEK GUIDE test strip USE TO TEST BLOOD SUGAR 3 TIMES DAILY 300 each 1  . Blood Glucose Monitoring Suppl (ACCU-CHEK GUIDE) w/Device KIT 1 Device by Does not apply route as directed. 1 kit 1  . Insulin Pen Needle 32G X 4 MM MISC Use to inject insulin 4 times daily 400 each 3  . JARDIANCE 10 MG TABS tablet TAKE 1 TABLET BY MOUTH EVERY DAY 90 tablet 4  . LEVEMIR FLEXTOUCH 100 UNIT/ML Pen INJECT 50 UNITS SUBCUTANEOUSLY DAILY AT 10PM 45 mL 3  . metFORMIN (GLUCOPHAGE) 850 MG tablet Take 1 tablet (850 mg total) by mouth 2 (two) times daily with a meal. 180 tablet 3  . norgestimate-ethinyl estradiol (ORTHO-CYCLEN,SPRINTEC,PREVIFEM) 0.25-35 MG-MCG tablet Take 1 tablet by mouth daily.    Marland Kitchen NOVOLOG FLEXPEN 100 UNIT/ML FlexPen INJECT 28 TO 35 UNITS INTO THE SKIN 3 TIMES DAILY BEFORE MEALS. 105 pen 0  . polyethylene glycol powder (GLYCOLAX/MIRALAX) powder Take 17 g by mouth 2 (two) times daily. 255 g 0  . rosuvastatin (CRESTOR) 10 MG tablet Take 1 tablet (10 mg total) by mouth daily. 90 tablet 1  . spironolactone (ALDACTONE) 50 MG tablet Take 1 tablet (50 mg total) by mouth 2 (two) times daily. 191 tablet 0  . TRULICITY 1.5 YN/8.2NF SOPN INJECT 0.5ML ONCE WEEKLY UNDER THE SKIN 6 pen 0   No current facility-administered medications on file prior to visit.     BP 138/62 (BP Location: Right Arm, Patient Position: Sitting, Cuff Size: Large)   Pulse 77   Temp 99.3 F (37.4 C) (Oral)   Resp 16   Ht 5' 5"  (1.651 m)   Wt (!) 321 lb (145.6 kg)   SpO2 100%   BMI 53.42 kg/m    Objective:   Physical Exam  Constitutional: She appears well-developed and well-nourished. No distress.  HENT:  Right TM occluded by cerumen  Skin: Skin is warm and dry.  Psychiatric: She has a normal mood and affect. Her behavior is normal. Judgment and thought content normal.          Assessment & Plan:  Depression-improved- will continue Prozac at current dose.  Plan to check on her again in about  6 weeks.  I think that she is having some stress related to the recent death in the family so I do not want to increase her medication at this time.  We will plan to reevaluate at follow-up.  We did discuss importance of watching her weight closely to avoid further weight gain while on Prozac.  Right cerumen impaction- cerumen was removed by CMA with flushing.  Patient tolerated procedure well.  Normal tympanic membrane is revealed following cerumen removal.

## 2018-05-08 ENCOUNTER — Ambulatory Visit: Payer: 59 | Admitting: Psychology

## 2018-05-10 ENCOUNTER — Telehealth: Payer: 59 | Admitting: Nurse Practitioner

## 2018-05-10 DIAGNOSIS — J00 Acute nasopharyngitis [common cold]: Secondary | ICD-10-CM

## 2018-05-10 MED ORDER — BENZONATATE 100 MG PO CAPS: 100.0000 mg | ORAL_CAPSULE | Freq: Three times a day (TID) | ORAL | 0 refills | Status: DC | PRN

## 2018-05-10 MED ORDER — FLUTICASONE PROPIONATE 50 MCG/ACT NA SUSP: 2.0000 | Freq: Every day | NASAL | 6 refills | Status: DC

## 2018-05-10 NOTE — Progress Notes (Signed)
We are sorry you are not feeling well.  Here is how we plan to help!  Based on what you have shared with me, it looks like you may have a viral upper respiratory infection or a "common cold".  Colds are caused by a large number of viruses; however, rhinovirus is the most common cause.   Symptoms of the common cold vary from person to person, with common symptoms including sore throat, cough, and malaise.  A low-grade fever of 100.4 may present, but is often uncommon.  Symptoms vary however, and are closely related to a person's age or underlying illnesses.  The most common symptoms associated with the common cold are nasal discharge or congestion, cough, sneezing, headache and pressure in the ears and face.  Cold symptoms usually persist for about 3 to 10 days, but can last up to 2 weeks.  It is important to know that colds do not cause serious illness or complications in most cases.    The common cold is transmitted from person to person, with the most common method of transmission being a person's hands.  The virus is able to live on the skin and can infect other persons for up to 2 hours after direct contact.  Also, colds are transmitted when someone coughs or sneezes; thus, it is important to cover the mouth to reduce this risk.  To keep the spread of the common cold at bay, good hand hygiene is very important.  This is an infection that is most likely caused by a virus. There are no specific treatments for the common cold other than to help you with the symptoms until the infection runs its course.    For nasal congestion, you may use an oral decongestants such as Mucinex D or if you have glaucoma or high blood pressure use plain Mucinex.  Saline nasal spray or nasal drops can help and can safely be used as often as needed for congestion.  For your congestion, I have prescribed Fluticasone nasal spray one spray in each nostril twice a day  If you do not have a history of heart disease, hypertension,  diabetes or thyroid disease, prostate/bladder issues or glaucoma, you may also use Sudafed to treat nasal congestion.  It is highly recommended that you consult with a pharmacist or your primary care physician to ensure this medication is safe for you to take.     If you have a cough, you may use cough suppressants such as Delsym and Robitussin.  If you have glaucoma or high blood pressure, you can also use Coricidin HBP.   For cough I have prescribed for you A prescription cough medication called Tessalon Perles 100 mg. You may take 1-2 capsules every 8 hours as needed for cough  If you have a sore or scratchy throat, use a saltwater gargle-  to  teaspoon of salt dissolved in a 4-ounce to 8-ounce glass of warm water.  Gargle the solution for approximately 15-30 seconds and then spit.  It is important not to swallow the solution.  You can also use throat lozenges/cough drops and Chloraseptic spray to help with throat pain or discomfort.  Warm or cold liquids can also be helpful in relieving throat pain.  For headache, pain or general discomfort, you can use Ibuprofen or Tylenol as directed.   Some authorities believe that zinc sprays or the use of Echinacea may shorten the course of your symptoms.   HOME CARE . Only take medications as instructed by your   medical team. . Be sure to drink plenty of fluids. Water is fine as well as fruit juices, sodas and electrolyte beverages. You may want to stay away from caffeine or alcohol. If you are nauseated, try taking small sips of liquids. How do you know if you are getting enough fluid? Your urine should be a pale yellow or almost colorless. . Get rest. . Taking a steamy shower or using a humidifier may help nasal congestion and ease sore throat pain. You can place a towel over your head and breathe in the steam from hot water coming from a faucet. . Using a saline nasal spray works much the same way. . Cough drops, hard candies and sore throat lozenges  may ease your cough. . Avoid close contacts especially the very young and the elderly . Cover your mouth if you cough or sneeze . Always remember to wash your hands.   GET HELP RIGHT AWAY IF: . You develop worsening fever. . If your symptoms do not improve within 10 days . You develop yellow or green discharge from your nose over 3 days. . You have coughing fits . You develop a severe head ache or visual changes. . You develop shortness of breath, difficulty breathing or start having chest pain . Your symptoms persist after you have completed your treatment plan  MAKE SURE YOU   Understand these instructions.  Will watch your condition.  Will get help right away if you are not doing well or get worse.  Your e-visit answers were reviewed by a board certified advanced clinical practitioner to complete your personal care plan. Depending upon the condition, your plan could have included both over the counter or prescription medications. Please review your pharmacy choice. If there is a problem, you may call our nursing hot line at and have the prescription routed to another pharmacy. Your safety is important to us. If you have drug allergies check your prescription carefully.   You can use MyChart to ask questions about today's visit, request a non-urgent call back, or ask for a work or school excuse for 24 hours related to this e-Visit. If it has been greater than 24 hours you will need to follow up with your provider, or enter a new e-Visit to address those concerns. You will get an e-mail in the next two days asking about your experience.  I hope that your e-visit has been valuable and will speed your recovery. Thank you for using e-visits.      

## 2018-05-17 ENCOUNTER — Encounter: Payer: Self-pay | Admitting: Family

## 2018-05-19 ENCOUNTER — Other Ambulatory Visit: Payer: Self-pay | Admitting: Internal Medicine

## 2018-05-19 MED ORDER — HYDROCODONE-HOMATROPINE 5-1.5 MG/5ML PO SYRP: 5.0000 mL | ORAL_SOLUTION | Freq: Every evening | ORAL | 0 refills | Status: DC | PRN

## 2018-05-19 MED ORDER — BENZONATATE 100 MG PO CAPS: 100.0000 mg | ORAL_CAPSULE | Freq: Three times a day (TID) | ORAL | 0 refills | Status: DC | PRN

## 2018-05-19 NOTE — Telephone Encounter (Signed)
Patient called and given advice per Dr. Drue NovelPaz below, she verbalized understanding. She says she needs a refill on [employer]alon Perles.

## 2018-05-19 NOTE — Telephone Encounter (Signed)
LMOM informing Pt to return call. Okay for PEC to discuss.  

## 2018-05-19 NOTE — Telephone Encounter (Signed)
Please call the patient: Recommend Mucinex DM, continue Tessalon Perles, use Flonase if she has nasal congestion. In addition I will send a small amount of hydrocodone to be taken at night only, it is a very good cough suppressant, it would make her sleepy so recommend to be careful. If she needs a refill on the [employer]alon Perles that is okay, #20.

## 2018-05-20 NOTE — Telephone Encounter (Signed)
Rx sent for Tessalon.

## 2018-05-23 ENCOUNTER — Telehealth: Payer: Self-pay | Admitting: *Deleted

## 2018-05-23 NOTE — Telephone Encounter (Signed)
Received Medical records from [facility]; forwarded to provider/SLS 12/27  

## 2018-05-26 ENCOUNTER — Other Ambulatory Visit: Payer: Self-pay | Admitting: Internal Medicine

## 2018-05-27 ENCOUNTER — Telehealth: Payer: Self-pay | Admitting: Family

## 2018-05-27 NOTE — Telephone Encounter (Signed)
See my chart message

## 2018-05-29 ENCOUNTER — Ambulatory Visit: Payer: 59 | Admitting: Family Medicine

## 2018-05-29 ENCOUNTER — Encounter: Payer: Self-pay | Admitting: Family Medicine

## 2018-05-29 VITALS — BP 142/82 | HR 99 | Temp 99.0°F | Resp 18 | Ht 65.0 in | Wt 322.0 lb

## 2018-05-29 DIAGNOSIS — J209 Acute bronchitis, unspecified: Secondary | ICD-10-CM

## 2018-05-29 DIAGNOSIS — R05 Cough: Secondary | ICD-10-CM | POA: Diagnosis not present

## 2018-05-29 DIAGNOSIS — R059 Cough, unspecified: Secondary | ICD-10-CM

## 2018-05-29 MED ORDER — AZITHROMYCIN 250 MG PO TABS: ORAL_TABLET | ORAL | 0 refills | Status: DC

## 2018-05-29 MED ORDER — ALBUTEROL SULFATE HFA 108 (90 BASE) MCG/ACT IN AERS: 2.0000 | INHALATION_SPRAY | Freq: Four times a day (QID) | RESPIRATORY_TRACT | 0 refills | Status: DC | PRN

## 2018-05-29 NOTE — Progress Notes (Signed)
[place] [facility] [address], [place], [place] [address] [phone]  Date:  05/29/2018   Name:  Audrey Goodman   DOB:  02-25-1971   MRN:  [id number]  PCP:  [name], NP    Chief Complaint: Cough (ongoing cough, since 05/09/18, fatigue, headache, no known fever, taking tessalon perles, hycodan syrup(out of))   History of Present Illness:  Audrey Goodman is a 48 y.o. very pleasant female patient who presents with the following:  History of diabetes, hyperlipidemia, hypertension, obesity.  Patientof Audrey Goodman, here today for sick visit.  Her diabetes is managed with Dr. Eulis Canner  She has complaint on 1214 of a cough as her main symptom She got sick with a cold mid December.  The cough has not resolved No fever noted She is not generally coughing anything up She did do an evisit on 12/14 and they treated her with cough meds- tesslon- but no abx She called back and got hycodan from [name]   She is not wheezing No sick contacts She is a never smoker  She is ok when she is still, moving around makes her cough more   No history of asthma , however her son does have asthma Her glucose has been ok during this recent illness After lunch her glucose was 150 today    Lab Results  Component Value Date   HGBA1C 8.7 (H) 02/03/2018     Patient Active Problem List   Diagnosis Date Noted  . Left Achilles tendinitis 06/19/2017  . Depression 07/13/2015  . Preventative health care 06/23/2014  . PCOS (polycystic ovarian syndrome) 09/10/2013  . CHOLELITHIASIS 02/20/2008  . Hyperlipidemia 08/06/2006  . Type 2 diabetes mellitus, uncontrolled ([place]) 07/30/2006  . HYPERTENSION, BENIGN ESSENTIAL 07/30/2006  . MORBID OBESITY 07/25/2006    Past Medical History:  Diagnosis Date  . Arthritis    left shoulder  . Depression   . Diabetes mellitus without complication ([place])    type 2  . Diverticulitis   . High  cholesterol   . History of chicken pox   . Hyperlipidemia   . Hypertension   . Shingles   . Sleep apnea    uses CPAP nightly  . SVD (spontaneous vaginal delivery) 1995   x 1  . Type 2 diabetes mellitus, uncontrolled ([place]) 07/30/2006   Qualifier: Diagnosis of  By: Drue Flirt  MD, Merrily Brittle      Past Surgical History:  Procedure Laterality Date  . [place]   right  . CHOLECYSTECTOMY  2008  . HYSTEROSCOPY N/A 09/13/2014   Procedure: HYSTEROSCOPY with IUD removal;  Surgeon: Linda Hedges, DO;  Location: [facility];  Service: Gynecology;  Laterality: N/A;  need longest speculum and instruments that aresavailable due to patient's habitus (BMI ~60)    Social History   Tobacco Use  . Smoking status: Never Smoker  . Smokeless tobacco: Never Used  Substance Use Topics  . Alcohol use: No    Alcohol/week: 0.0 standard drinks  . Drug use: No    Family History  Problem Relation Age of Onset  . Cancer Mother        breast  . Hyperlipidemia Mother   . Heart disease Mother   . Hypertension Mother   . Mental illness Mother        depression  . Diabetes Mother   . Hyperlipidemia Father   . Stroke Father   . Hypertension Father   .  Cancer Maternal Grandmother        colon  . Diabetes Maternal Grandmother   . Diabetes Maternal Uncle   . Diabetes Maternal Aunt   . Diabetes Maternal Uncle     Allergies  Allergen Reactions  . Lisinopril Swelling    Swelling of the left side of tongue  . Lipitor [Atorvastatin] Other (See Comments)    Myalgia/foot cramping    Medication list has been reviewed and updated.  Current Outpatient Medications on File Prior to Visit  Medication Sig Dispense Refill  . ACCU-CHEK GUIDE test strip USE TO TEST BLOOD SUGAR 3 TIMES DAILY 300 each 1  . benzonatate (TESSALON PERLES) 100 MG capsule Take 1 capsule (100 mg total) by mouth 3 (three) times daily as needed. 30 capsule 0  . Blood Glucose Monitoring Suppl (ACCU-CHEK GUIDE) w/Device KIT 1  Device by Does not apply route as directed. 1 kit 1  . FLUoxetine (PROZAC) 10 MG tablet Take 1 tablet (10 mg total) by mouth daily. 90 tablet 1  . fluticasone (FLONASE) 50 MCG/ACT nasal spray Place 2 sprays into both nostrils daily. 16 g 6  . HYDROcodone-homatropine (HYCODAN) 5-1.5 MG/5ML syrup Take 5 mLs by mouth at bedtime as needed for cough. 90 mL 0  . Insulin Pen Needle 32G X 4 MM MISC Use to inject insulin 4 times daily 400 each 3  . JARDIANCE 10 MG TABS tablet TAKE 1 TABLET BY MOUTH EVERY DAY 90 tablet 4  . LEVEMIR FLEXTOUCH 100 UNIT/ML Pen INJECT 50 UNITS SUBCUTANEOUSLY DAILY AT 10PM 45 mL 3  . metFORMIN (GLUCOPHAGE) 850 MG tablet Take 1 tablet (850 mg total) by mouth 2 (two) times daily with a meal. 180 tablet 3  . norgestimate-ethinyl estradiol (ORTHO-CYCLEN,SPRINTEC,PREVIFEM) 0.25-35 MG-MCG tablet Take 1 tablet by mouth daily.    Marland Kitchen NOVOLOG FLEXPEN 100 UNIT/ML FlexPen INJECT 28 TO 35 UNITS INTO THE SKIN 3 TIMES DAILY BEFORE MEALS. 105 pen 0  . polyethylene glycol powder (GLYCOLAX/MIRALAX) powder Take 17 g by mouth 2 (two) times daily. 255 g 0  . rosuvastatin (CRESTOR) 10 MG tablet Take 1 tablet (10 mg total) by mouth daily. 90 tablet 1  . spironolactone (ALDACTONE) 50 MG tablet Take 1 tablet (50 mg total) by mouth 2 (two) times daily. 951 tablet 0  . TRULICITY 1.5 OA/4.1YS SOPN INJECT 0.5ML ONCE WEEKLY UNDER THE SKIN 6 pen 0   No current facility-administered medications on file prior to visit.     Review of Systems:  As per HPI- otherwise negative. No vomiting or diarrhea, no rash   Physical Examination: Vitals:   05/29/18 1530  BP: (!) 142/82  Pulse: 99  Resp: 18  Temp: 99 F (37.2 C)  SpO2: 97%   Vitals:   05/29/18 1530  Weight: (!) 322 lb (146.1 kg)  Height: 5' 5"  (1.651 m)   Body mass index is 53.58 kg/m. Ideal Body Weight: Weight in (lb) to have BMI = 25: 149.9  GEN: WDWN, NAD, Non-toxic, A & O x 3, obese, looks well  HEENT: Atraumatic, Normocephalic. Neck  supple. No masses, No LAD.  Bilateral TM wnl, oropharynx normal.  PEERL,EOMI.   Ears and Nose: No external deformity. CV: RRR, No M/G/R. No JVD. No thrill. No extra heart sounds. PULM: CTA B, no wheezes, crackles, rhonchi. No retractions. No resp. distress. No accessory muscle use. ABD: S, NT, ND EXTR: No c/c/e NEURO Normal gait.  PSYCH: Normally interactive. Conversant. Not depressed or anxious appearing.  Calm demeanor.  Coughing  some in room today, but no wheezing appreciated   Assessment and Plan: Acute bronchitis, unspecified organism - Plan: azithromycin (ZITHROMAX) 250 MG tablet, albuterol (PROVENTIL HFA;VENTOLIN HFA) 108 (90 Base) MCG/ACT inhaler  Cough  Here today with cough for about 3 weeks.  This is failed to get better despite cough medication. No fever, will treat for bronchitis with azithromycin. Albuterol inhaler to use as needed. We will avoid steroids for now she does have diabetes-if her symptoms fail to resolve we may need to use a short course of low-dose prednisone  I have asked the patient to let us know if not feeling better next few days, she agrees and feels comfortable with our current plan  Signed Lamar Blinks, MD

## 2018-05-29 NOTE — Patient Instructions (Signed)
We are going to treat you for bronchitis with azithromycin antibiotic and also albuterol inhaler as needed Please let me know if you are not feeling better in the next few days- Sooner if worse.

## 2018-06-03 ENCOUNTER — Ambulatory Visit (HOSPITAL_BASED_OUTPATIENT_CLINIC_OR_DEPARTMENT_OTHER)
Admission: RE | Admit: 2018-06-03 | Discharge: 2018-06-03 | Disposition: A | Discharge status: Home / Self Care | Payer: 59 | Source: Ambulatory Visit | Attending: Family | Admitting: Family

## 2018-06-03 ENCOUNTER — Ambulatory Visit: Payer: Self-pay

## 2018-06-03 ENCOUNTER — Telehealth: Payer: Self-pay | Admitting: *Deleted

## 2018-06-03 DIAGNOSIS — J40 Bronchitis, not specified as acute or chronic: Secondary | ICD-10-CM

## 2018-06-03 DIAGNOSIS — R0989 Other specified symptoms and signs involving the circulatory and respiratory systems: Secondary | ICD-10-CM | POA: Diagnosis not present

## 2018-06-03 NOTE — Telephone Encounter (Signed)
Order has been placed for chest x-ray.

## 2018-06-03 NOTE — Telephone Encounter (Signed)
Pt seen 05/29/2018 by Dr.Copland.Marland Kitchen"Acute bronchitis"  States symptoms have not improved. Completed course of ATBs yesterday. States inhaler ineffective.Denies wheezing.  Cough productive for "Whitish" phlegm. Reports mild headache, unsure if febrile. States "No new symptoms, just never got any better." Pt reports Dr. Patsy Lager mentioned CXR if no improvement. Pt states she would like CXR ordered  "To be sure."   Please advise:  [phone] Reason for Disposition . [1] Continuous (nonstop) coughing interferes with work or school AND [2] no improvement using cough treatment per Care Advice  Answer Assessment - Initial Assessment Questions 1. ONSET: "When did the cough begin?"      05/29/2018 2. SEVERITY: "How bad is the cough today?"      severe 3. RESPIRATORY DISTRESS: "Describe your breathing."      SOB with coughing 4. FEVER: "Do you have a fever?" If so, ask: "What is your temperature, how was it measured, and when did it start?"     Unsure maybe LGT 5. SPUTUM: "Describe the color of your sputum" (clear, white, yellow, green)     Whitish 6. HEMOPTYSIS: "Are you coughing up any blood?" If so ask: "How much?" (flecks, streaks, tablespoons, etc.)     *No Answer* 7. CARDIAC HISTORY: "Do you have any history of heart disease?" (e.g., heart attack, congestive heart failure)      *No Answer* 8. LUNG HISTORY: "Do you have any history of lung disease?"  (e.g., pulmonary embolus, asthma, emphysema)     *No Answer* 9. PE RISK FACTORS: "Do you have a history of blood clots?" (or: recent major surgery, recent prolonged travel, bedridden)     *No Answer* 10. OTHER SYMPTOMS: "Do you have any other symptoms?" (e.g., runny nose, wheezing, chest pain)      Fatugue  Protocols used: COUGH - ACUTE PRODUCTIVE-A-AH

## 2018-06-03 NOTE — Telephone Encounter (Signed)
Patient advised she can come for chest xray today.

## 2018-06-03 NOTE — Telephone Encounter (Signed)
Call returned to patient. Left VM to return call to office. 

## 2018-06-03 NOTE — Telephone Encounter (Signed)
Copied from CRM [id number]. Topic: General - Other >> Jun 03, 2018  6:01 PM Jilda Roche wrote: Reason for CRM: Patient called to get results of her chest xray, please advise  Best call back is [phone]

## 2018-06-04 ENCOUNTER — Encounter: Payer: Self-pay | Admitting: Family

## 2018-06-04 ENCOUNTER — Other Ambulatory Visit: Payer: Self-pay | Admitting: Internal Medicine

## 2018-06-04 MED ORDER — BENZONATATE 100 MG PO CAPS: 100.0000 mg | ORAL_CAPSULE | Freq: Three times a day (TID) | ORAL | 0 refills | Status: DC | PRN

## 2018-06-05 NOTE — Telephone Encounter (Signed)
Please contact pt to schedule an appointment.  Needs OV- ok to schedule with someone else since I am out of the office. (see mychart message).

## 2018-06-06 ENCOUNTER — Encounter: Payer: Self-pay | Admitting: Family

## 2018-06-06 ENCOUNTER — Ambulatory Visit: Payer: 59 | Admitting: Family

## 2018-06-06 VITALS — BP 161/71 | HR 101 | Temp 99.4°F | Resp 16 | Ht 65.0 in | Wt 320.0 lb

## 2018-06-06 DIAGNOSIS — R059 Cough, unspecified: Secondary | ICD-10-CM

## 2018-06-06 DIAGNOSIS — J45901 Unspecified asthma with (acute) exacerbation: Secondary | ICD-10-CM

## 2018-06-06 DIAGNOSIS — I10 Essential (primary) hypertension: Secondary | ICD-10-CM | POA: Diagnosis not present

## 2018-06-06 DIAGNOSIS — R05 Cough: Secondary | ICD-10-CM | POA: Diagnosis not present

## 2018-06-06 MED ORDER — AMLODIPINE BESYLATE 5 MG PO TABS: 5.0000 mg | ORAL_TABLET | Freq: Every day | ORAL | 3 refills | Status: DC

## 2018-06-06 MED ORDER — ALBUTEROL SULFATE (2.5 MG/3ML) 0.083% IN NEBU
2.5000 mg | INHALATION_SOLUTION | Freq: Once | RESPIRATORY_TRACT | Status: AC
  Administered 2018-06-06: 2.5 mg via RESPIRATORY_TRACT

## 2018-06-06 MED ORDER — PREDNISONE 10 MG PO TABS: ORAL_TABLET | ORAL | 0 refills | Status: DC

## 2018-06-06 MED ORDER — HYDROCODONE-HOMATROPINE 5-1.5 MG/5ML PO SYRP: 5.0000 mL | ORAL_SOLUTION | Freq: Two times a day (BID) | ORAL | 0 refills | Status: DC | PRN

## 2018-06-06 NOTE — Telephone Encounter (Signed)
Patient received results in MyChart from [facility].

## 2018-06-06 NOTE — Addendum Note (Signed)
Addended by: [name] on: 06/06/2018 01:56 PM   Modules accepted: Orders

## 2018-06-06 NOTE — Patient Instructions (Addendum)
Please add amlodipine once daily for blood pressure. Begin prednisone for asthma flare. Use albuterol 2 puffs very 6 hours until breathing is improved, then as needed.

## 2018-06-06 NOTE — Progress Notes (Signed)
Subjective:    Patient ID: Audrey Goodman, female    DOB: March 19, 1971, 48 y.o.   MRN: [id number]  HPI   Patient is a 48 yr old female who presents today for follow up of her bronchitis. She was treated on 05/29/17   She was also treated for cough on 12/14 which had not resolved at the time of her 1/2 visit.  When she was reevaluated on 1/2 she was treated with zpak.  She called back to report ongoing cough and a cxr was performed on 06/03/17 and was clear. Reports no change in her cough with the zpak.  Reports that she has felt sob. Hurts to take a deep breath.  Wt Readings from Last 3 Encounters:  06/06/18 (!) 320 lb (145.2 kg)  05/29/18 (!) 322 lb (146.1 kg)  05/05/18 (!) 321 lb (145.6 kg)      Review of Systems    see HPI  Past Medical History:  Diagnosis Date  . Arthritis    left shoulder  . Depression   . Diabetes mellitus without complication ([place])    type 2  . Diverticulitis   . High cholesterol   . History of chicken pox   . Hyperlipidemia   . Hypertension   . Shingles   . Sleep apnea    uses CPAP nightly  . SVD (spontaneous vaginal delivery) 1995   x 1  . Type 2 diabetes mellitus, uncontrolled ([place]) 07/30/2006   Qualifier: Diagnosis of  By: Drue Flirt  MD, Merrily Brittle       Social History   Socioeconomic History  . Marital status: Single    Spouse name: Not on file  . Number of children: Not on file  . Years of education: Not on file  . Highest education level: Not on file  Occupational History  . Not on file  Social Needs  . Financial resource strain: Not on file  . Food insecurity:    Worry: Not on file    Inability: Not on file  . Transportation needs:    Medical: Not on file    Non-medical: Not on file  Tobacco Use  . Smoking status: Never Smoker  . Smokeless tobacco: Never Used  Substance and Sexual Activity  . Alcohol use: No    Alcohol/week: 0.0 standard drinks  . Drug use: No  . Sexual activity: Yes    Birth control/protection: Pill    Lifestyle  . Physical activity:    Days per week: Not on file    Minutes per session: Not on file  . Stress: Not on file  Relationships  . Social connections:    Talks on phone: Not on file    Gets together: Not on file    Attends religious service: Not on file    Active member of club or organization: Not on file    Attends meetings of clubs or organizations: Not on file    Relationship status: Not on file  . Intimate partner violence:    Fear of current or ex partner: Not on file    Emotionally abused: Not on file    Physically abused: Not on file    Forced sexual activity: Not on file  Other Topics Concern  . Not on file  Social History Narrative   Single- lives with boyfriend.   Has a boyfriend   Son 56- senior at [employer]   Enjoys reading   Works as a Marketing executive at [employer]     Past  Surgical History:  Procedure Laterality Date  . [place]   right  . CHOLECYSTECTOMY  2008  . HYSTEROSCOPY N/A 09/13/2014   Procedure: HYSTEROSCOPY with IUD removal;  Surgeon: Linda Hedges, DO;  Location: [facility];  Service: Gynecology;  Laterality: N/A;  need longest speculum and instruments that aresavailable due to patient's habitus (BMI ~60)    Family History  Problem Relation Age of Onset  . Cancer Mother        breast  . Hyperlipidemia Mother   . Heart disease Mother   . Hypertension Mother   . Mental illness Mother        depression  . Diabetes Mother   . Hyperlipidemia Father   . Stroke Father   . Hypertension Father   . Cancer Maternal Grandmother        colon  . Diabetes Maternal Grandmother   . Diabetes Maternal Uncle   . Diabetes Maternal Aunt   . Diabetes Maternal Uncle     Allergies  Allergen Reactions  . Lisinopril Swelling    Swelling of the left side of tongue  . Lipitor [Atorvastatin] Other (See Comments)    Myalgia/foot cramping    Current Outpatient Medications on File Prior to Visit  Medication Sig Dispense Refill  .  ACCU-CHEK GUIDE test strip USE TO TEST BLOOD SUGAR 3 TIMES DAILY 300 each 1  . albuterol (PROVENTIL HFA;VENTOLIN HFA) 108 (90 Base) MCG/ACT inhaler Inhale 2 puffs into the lungs every 6 (six) hours as needed for wheezing or shortness of breath. 1 Inhaler 0  . benzonatate (TESSALON PERLES) 100 MG capsule Take 1 capsule (100 mg total) by mouth 3 (three) times daily as needed. 30 capsule 0  . Blood Glucose Monitoring Suppl (ACCU-CHEK GUIDE) w/Device KIT 1 Device by Does not apply route as directed. 1 kit 1  . FLUoxetine (PROZAC) 10 MG tablet Take 1 tablet (10 mg total) by mouth daily. 90 tablet 1  . fluticasone (FLONASE) 50 MCG/ACT nasal spray Place 2 sprays into both nostrils daily. 16 g 6  . HYDROcodone-homatropine (HYCODAN) 5-1.5 MG/5ML syrup Take 5 mLs by mouth at bedtime as needed for cough. 90 mL 0  . Insulin Pen Needle 32G X 4 MM MISC Use to inject insulin 4 times daily 400 each 3  . JARDIANCE 10 MG TABS tablet TAKE 1 TABLET BY MOUTH EVERY DAY 90 tablet 4  . LEVEMIR FLEXTOUCH 100 UNIT/ML Pen INJECT 50 UNITS SUBCUTANEOUSLY DAILY AT 10PM 45 mL 3  . metFORMIN (GLUCOPHAGE) 850 MG tablet Take 1 tablet (850 mg total) by mouth 2 (two) times daily with a meal. 180 tablet 3  . norgestimate-ethinyl estradiol (ORTHO-CYCLEN,SPRINTEC,PREVIFEM) 0.25-35 MG-MCG tablet Take 1 tablet by mouth daily.    Marland Kitchen NOVOLOG FLEXPEN 100 UNIT/ML FlexPen INJECT 28 TO 35 UNITS INTO THE SKIN 3 TIMES DAILY BEFORE MEALS. 105 pen 0  . polyethylene glycol powder (GLYCOLAX/MIRALAX) powder Take 17 g by mouth 2 (two) times daily. 255 g 0  . rosuvastatin (CRESTOR) 10 MG tablet Take 1 tablet (10 mg total) by mouth daily. 90 tablet 1  . spironolactone (ALDACTONE) 50 MG tablet Take 1 tablet (50 mg total) by mouth 2 (two) times daily. 599 tablet 0  . TRULICITY 1.5 JT/7.0VX SOPN INJECT 0.5ML ONCE WEEKLY UNDER THE SKIN 6 pen 0   No current facility-administered medications on file prior to visit.     BP (!) 161/71 (BP Location: Right Arm,  Patient Position: Sitting, Cuff Size: Large)  Pulse (!) 101   Temp 99.4 F (37.4 C) (Oral)   Resp 16   Ht 5' 5"  (1.651 m)   Wt (!) 320 lb (145.2 kg)   LMP 06/03/2018   SpO2 96%   BMI 53.25 kg/m    Objective:   Physical Exam Constitutional:      Appearance: She is well-developed.  HENT:     Right Ear: Tympanic membrane and ear canal normal.     Left Ear: Tympanic membrane and ear canal normal.     Nose: No congestion.  Neck:     Musculoskeletal: Neck supple.     Thyroid: No thyromegaly.  Cardiovascular:     Rate and Rhythm: Normal rate and regular rhythm.     Heart sounds: Normal heart sounds. No murmur.  Pulmonary:     Effort: Pulmonary effort is normal. No respiratory distress.     Breath sounds: Normal breath sounds. No wheezing.  Skin:    General: Skin is warm and dry.  Neurological:     Mental Status: She is alert and oriented to person, place, and time.  Psychiatric:        Behavior: Behavior normal.        Thought Content: Thought content normal.        Judgment: Judgment normal.           Assessment/plan    HTN- uncontrolled. Add amlodipine 54m once daily.  BP Readings from Last 3 Encounters:  06/06/18 (!) 161/71  05/29/18 (!) 142/82  05/05/18 138/62   Acute asthma exacerbation- no overt wheezing today but patient was given albuterol inhaler with improvement in cough and tightness in her breathing.  Will rx with prednisone taper as well as albuterol as needed.

## 2018-06-09 ENCOUNTER — Ambulatory Visit: Payer: 59 | Admitting: Internal Medicine

## 2018-06-09 ENCOUNTER — Encounter: Payer: Self-pay | Admitting: Internal Medicine

## 2018-06-09 ENCOUNTER — Telehealth: Payer: Self-pay | Admitting: Internal Medicine

## 2018-06-09 VITALS — BP 138/82 | HR 84 | Ht 65.0 in | Wt 319.0 lb

## 2018-06-09 DIAGNOSIS — E785 Hyperlipidemia, unspecified: Secondary | ICD-10-CM

## 2018-06-09 DIAGNOSIS — E282 Polycystic ovarian syndrome: Secondary | ICD-10-CM

## 2018-06-09 DIAGNOSIS — E1165 Type 2 diabetes mellitus with hyperglycemia: Secondary | ICD-10-CM | POA: Diagnosis not present

## 2018-06-09 LAB — POCT GLYCOSYLATED HEMOGLOBIN (HGB A1C): Hemoglobin A1C: 9.6 % — AB (ref 4.0–5.6)

## 2018-06-09 MED ORDER — INSULIN DETEMIR 100 UNIT/ML FLEXPEN: PEN_INJECTOR | SUBCUTANEOUS | 3 refills | Status: DC

## 2018-06-09 NOTE — Telephone Encounter (Signed)
OK, I will see her today and decide about possible changes in tx

## 2018-06-09 NOTE — Telephone Encounter (Signed)
Per [facility] "Caller states her blood sugar is 351. She uses Novalog S/S and Lantus 50 units at bedtime. She is on Prednisone. She took 40 units of Novalog tonight. She took 20 extra units. She has an appt tomorrow."

## 2018-06-09 NOTE — Patient Instructions (Addendum)
Please continue: - Metformin 850 mg 2x a day  - Trulicity 1.5 mg weekly - Novolog - smaller meal: 15 units  - large meal: 25 units  - NovoLog SSI:  - 150- 165: + 2 unit  - 166- 180: + 3 units  - 181- 195: + 4 units  - 196- 210: + 5 units  - 211- 225: + 6 units  - 226- 240: + 7 units  - 241- 255: + 8 units  - >255: + 9 units  Please increase: - Levemir 50 units in am and 20 units at bedtime  Please return in 3 months with your sugar log.

## 2018-06-09 NOTE — Progress Notes (Signed)
Patient ID: Audrey Goodman, female   DOB: [birth year], 47 y.o.   MRN: [id number]  HPI: Audrey Goodman is a 48 y.o.-year-old female, returning for f/u for DM2, dx with GDM in 1995, then DM 6 mo after son was born, insulin-dependent since ~2010, uncontrolled, without complications and PCOS. Last visit 4 months ago.  She had bronchitis >> asthma, in 04/2018. She is on Prednisone - taper: now   Last hemoglobin A1c was: Lab Results  Component Value Date   HGBA1C 8.7 (H) 02/03/2018   HGBA1C 8.5 09/16/2017   HGBA1C 8 04/23/2017   Pt is on a regimen of: - Metformin 850 mg 2x a day  - -started 01/2018, could not tolerate b/c increase urination - Trulicity 1.5 mg weekly - Levemir 50 units in am  - Novolog - smaller meal: 15 units  - large meal: 25 units  - NovoLog SSI:  - 150- 165: + 2 unit  - 166- 180: + 3 units  - 181- 195: + 4 units  - 196- 210: + 5 units  - 211- 225: + 6 units  - 226- 240: + 7 units  - 241- 255: + 8 units  - >255: + 9 units  Pt checks her sugars 3x a day: - am:  120-140,184 >> 120-150 >> 93 (corrected CBG of 501), low 200s - 2h after b'fast: n/c - before lunch: 79 x1 >> 130-140 >> n/c - 2h after lunch: n/c >> 113 >> n/c - before dinner:  130s >> 150-160 >> 180-260 - 2h after dinner: n/c - bedtime: n/c >> 150s >> n/c - nighttime: n/c Lowest sugar was  69 (ate a small dinner) >> 93; she has hypoglycemia awareness in the 80s Highest sugar was 273 (missed insulin) >> 180 >> 250 >> 501 (streroid).  -+ CKD, last BUN/creatinine:  Lab Results  Component Value Date   BUN 16 02/03/2018   CREATININE 0.99 02/03/2018  08/02/2017: BUN/creatinine 13/0.87, GFR 93, potassium 5.3 (3.5-5.2) Off lisinopril. She had increased urinary proteins>> now seeing nephrology Component     Latest Ref Rng & Units 03/28/2017  Creatinine, Urine     20 - 275 mg/dL 189  Protein/Creat Ratio     21 - 161 mg/g creat 180 (H)  Total Protein, Urine     5 - 24 mg/dL 34 (H)  Albumin     % 63   Alpha-1-Globulin, U     % 6  ALPHA-2-GLOBULIN, U     % 8  Beta Globulin, U     % 12  Gamma Globulin, U     % 11  Interpretation        Prot,24hr calculated     0 - 149 mg/24 h 480 (H)   -+ HL; last set of lipids: Lab Results  Component Value Date   CHOL 155 02/03/2018   HDL 58.00 02/03/2018   LDLCALC 71 02/03/2018   TRIG 133.0 02/03/2018   CHOLHDL 3 02/03/2018  Off Zetia.  + Leg cramps on Crestor 10.  Tried magnesium but did not help.  I recommended co-Q10 at last visit. - last eye exam was: 06/2017: No DR. Dr. Katy Fitch. -No numbness and tingling in her feet.  She could not afford gastric bypass surgery.  PCOS: -She had irregular menses since menarche -+1 pregnancy, no miscarriages - was on Mirena IUD - for ~4.5 years >> started Sprintec in 2016.  No side effects. -She has significant hirsutism-helped by spironolactone 50 mg twice a day -Also  takes metformin 850 mg twice a day  She had a slightly elevated potassium before last visit so we stopped her potassium supplement.  Subsequent potassium level was normal: Lab Results  Component Value Date   K 4.3 02/03/2018   ROS: Constitutional: no weight gain/no weight loss, no fatigue, no subjective hyperthermia, no subjective hypothermia Eyes: no blurry vision, no xerophthalmia ENT: no sore throat, no nodules palpated in neck, no dysphagia, no odynophagia, no hoarseness Cardiovascular: no CP/+ SOB/no palpitations/no leg swelling Respiratory: + Cough/+ SOB/no wheezing Gastrointestinal: no N/no V/no D/no C/no acid reflux Musculoskeletal: no muscle aches/no joint aches Skin: no rashes, no hair loss Neurological: no tremors/no numbness/no tingling/no dizziness  I reviewed pt's medications, allergies, PMH, social hx, family hx, and changes were documented in the history of present illness. Otherwise, unchanged from my initial visit note.  Started amlodipine and albuterol inhaler since last visit.  Stopped Wellbutrin.  Past  Medical History:  Diagnosis Date  . Arthritis    left shoulder  . Depression   . Diabetes mellitus without complication ([place])    type 2  . Diverticulitis   . High cholesterol   . History of chicken pox   . Hyperlipidemia   . Hypertension   . Shingles   . Sleep apnea    uses CPAP nightly  . SVD (spontaneous vaginal delivery) 1995   x 1  . Type 2 diabetes mellitus, uncontrolled ([place]) 07/30/2006   Qualifier: Diagnosis of  By: Drue Flirt  MD, Merrily Brittle     Past Surgical History:  Procedure Laterality Date  . [place]   right  . CHOLECYSTECTOMY  2008  . HYSTEROSCOPY N/A 09/13/2014   Procedure: HYSTEROSCOPY with IUD removal;  Surgeon: Linda Hedges, DO;  Location: [facility];  Service: Gynecology;  Laterality: N/A;  need longest speculum and instruments that aresavailable due to patient's habitus (BMI ~60)   Social History   Socioeconomic History  . Marital status: Single    Spouse name: Not on file  . Number of children: Not on file  . Years of education: Not on file  . Highest education level: Not on file  Occupational History  . Not on file  Social Needs  . Financial resource strain: Not on file  . Food insecurity:    Worry: Not on file    Inability: Not on file  . Transportation needs:    Medical: Not on file    Non-medical: Not on file  Tobacco Use  . Smoking status: Never Smoker  . Smokeless tobacco: Never Used  Substance and Sexual Activity  . Alcohol use: No    Alcohol/week: 0.0 standard drinks  . Drug use: No  . Sexual activity: Yes    Birth control/protection: Pill  Lifestyle  . Physical activity:    Days per week: Not on file    Minutes per session: Not on file  . Stress: Not on file  Relationships  . Social connections:    Talks on phone: Not on file    Gets together: Not on file    Attends religious service: Not on file    Active member of club or organization: Not on file    Attends meetings of clubs or organizations: Not on file     Relationship status: Not on file  . Intimate partner violence:    Fear of current or ex partner: Not on file    Emotionally abused: Not on file    Physically abused: Not on file  Forced sexual activity: Not on file  Other Topics Concern  . Not on file  Social History Narrative   Single- lives with boyfriend.   Has a boyfriend   Son 39- senior at [employer]   Enjoys reading   Works as a Marketing executive at [employer] Medications on File Prior to Visit  Medication Sig Dispense Refill  . ACCU-CHEK GUIDE test strip USE TO TEST BLOOD SUGAR 3 TIMES DAILY 300 each 1  . albuterol (PROVENTIL HFA;VENTOLIN HFA) 108 (90 Base) MCG/ACT inhaler Inhale 2 puffs into the lungs every 6 (six) hours as needed for wheezing or shortness of breath. 1 Inhaler 0  . amLODipine (NORVASC) 5 MG tablet Take 1 tablet (5 mg total) by mouth daily. 30 tablet 3  . benzonatate (TESSALON PERLES) 100 MG capsule Take 1 capsule (100 mg total) by mouth 3 (three) times daily as needed. 30 capsule 0  . Blood Glucose Monitoring Suppl (ACCU-CHEK GUIDE) w/Device KIT 1 Device by Does not apply route as directed. 1 kit 1  . FLUoxetine (PROZAC) 10 MG tablet Take 1 tablet (10 mg total) by mouth daily. 90 tablet 1  . fluticasone (FLONASE) 50 MCG/ACT nasal spray Place 2 sprays into both nostrils daily. 16 g 6  . HYDROcodone-homatropine (HYCODAN) 5-1.5 MG/5ML syrup Take 5 mLs by mouth 2 (two) times daily as needed for cough. 50 mL 0  . Insulin Pen Needle 32G X 4 MM MISC Use to inject insulin 4 times daily 400 each 3  . JARDIANCE 10 MG TABS tablet TAKE 1 TABLET BY MOUTH EVERY DAY 90 tablet 4  . LEVEMIR FLEXTOUCH 100 UNIT/ML Pen INJECT 50 UNITS SUBCUTANEOUSLY DAILY AT 10PM 45 mL 3  . metFORMIN (GLUCOPHAGE) 850 MG tablet Take 1 tablet (850 mg total) by mouth 2 (two) times daily with a meal. 180 tablet 3  . norgestimate-ethinyl estradiol (ORTHO-CYCLEN,SPRINTEC,PREVIFEM) 0.25-35 MG-MCG tablet Take 1 tablet by mouth daily.     Marland Kitchen NOVOLOG FLEXPEN 100 UNIT/ML FlexPen INJECT 28 TO 35 UNITS INTO THE SKIN 3 TIMES DAILY BEFORE MEALS. 105 pen 0  . polyethylene glycol powder (GLYCOLAX/MIRALAX) powder Take 17 g by mouth 2 (two) times daily. 255 g 0  . predniSONE (DELTASONE) 10 MG tablet 4 tabs by mouth once daily for 2 days, then 3 tabs daily x 2 days, then 2 tabs daily x 2 days, then 1 tab daily x 2 days 20 tablet 0  . rosuvastatin (CRESTOR) 10 MG tablet Take 1 tablet (10 mg total) by mouth daily. 90 tablet 1  . spironolactone (ALDACTONE) 50 MG tablet Take 1 tablet (50 mg total) by mouth 2 (two) times daily. 017 tablet 0  . TRULICITY 1.5 PZ/0.2HE SOPN INJECT 0.5ML ONCE WEEKLY UNDER THE SKIN 6 pen 0   No current facility-administered medications on file prior to visit.    Allergies  Allergen Reactions  . Lisinopril Swelling    Swelling of the left side of tongue  . Lipitor [Atorvastatin] Other (See Comments)    Myalgia/foot cramping   Family History  Problem Relation Age of Onset  . Cancer Mother        breast  . Hyperlipidemia Mother   . Heart disease Mother   . Hypertension Mother   . Mental illness Mother        depression  . Diabetes Mother   . Hyperlipidemia Father   . Stroke Father   . Hypertension Father   . Cancer Maternal Grandmother  colon  . Diabetes Maternal Grandmother   . Diabetes Maternal Uncle   . Diabetes Maternal Aunt   . Diabetes Maternal Uncle     PE: BP 138/82   Pulse 84   Ht 5' 5"  (1.651 m) Comment: measured  Wt (!) 319 lb (144.7 kg)   LMP 06/03/2018   SpO2 95%   BMI 53.08 kg/m  Wt Readings from Last 3 Encounters:  06/09/18 (!) 319 lb (144.7 kg)  06/06/18 (!) 320 lb (145.2 kg)  05/29/18 (!) 322 lb (146.1 kg)   Constitutional: overweight, in NAD Eyes: PERRLA, EOMI, no exophthalmos ENT: moist mucous membranes, no thyromegaly, no cervical lymphadenopathy Cardiovascular: RRR, No MRG Respiratory: CTA B Gastrointestinal: abdomen soft, NT, ND, BS+ Musculoskeletal: no  deformities, strength intact in all 4 Skin: moist, warm, no rashes, + hirsutism on chin and chest Neurological: no tremor with outstretched hands, DTR normal in all 4  ASSESSMENT: 1. DM2, insulin-dependent, uncontrolled, without complications  2. PCOS Component     Latest Ref Rng 10/16/2013  Testosterone     10 - 70 ng/dL 54  Sex Hormone Binding     18 - 114 nmol/L 12 (L)  Testosterone Free     0.6 - 6.8 pg/mL 15.5 (H)  Testosterone-% Free     0.4 - 2.4 % 2.9 (H)  TSH     0.35 - 4.50 uIU/mL 0.54  17-OH-Progesterone, LC/MS/MS      <8  Free T4     0.60 - 1.60 ng/dL 0.84  T3, Free     2.3 - 4.2 pg/mL 3.0   Labs confirmed PCOS (high testosterone, low 17-HO Prog, normal TFTs).  LH and FSH were not tested as she was on the Mirena IUD.  3. HL  4.  Obesity class III  PLAN:  1. DM2 - Patient with longstanding, uncontrolled, type 2 diabetes, on basal-bolus insulin regimen, with metformin, GLP-1 receptor agonist.  We also tried to add a low dose SGLT 2 inhibitor at last visit, but she could not tolerate it due to increased urination. -At this visit, sugars are much higher possibly due to the holidays but also as she is on prednisone for bronchitis/asthma. -We discussed about adding a lower dose Levemir at bedtime, also.  I advised her how to balance the 2 doses depending on the sugars. -Also discussed about diet and I suggested a low-fat diet, more plant-based, but she not like a lot of fruits and vegetables.  She was thinking about a low-carb diet.  I suggested a referral to the weight management clinic.  Losing weight will also improve her diabetes. -  I suggested to:  Patient Instructions  Please continue: - Metformin 850 mg 2x a day  - Trulicity 1.5 mg weekly - Novolog - smaller meal: 15 units  - large meal: 25 units  - NovoLog SSI:  - 150- 165: + 2 unit  - 166- 180: + 3 units  - 181- 195: + 4 units  - 196- 210: + 5 units  - 211- 225: + 6 units  - 226- 240: + 7 units  -  241- 255: + 8 units  - >255: + 9 units  Please increase: - Levemir 50 units in am and 20 units at bedtime  Please return in 3 months with your sugar log.    - today, HbA1c is 9.6% (higher) - continue checking sugars at different times of the day - check 3-4x a day, rotating checks - advised for yearly eye exams >>  she is UTD - Return to clinic in 3 mo with sugar log    2. PCOS -Patient with clinically evident PCOS, confirmed biochemically in 2015 -She had an IUD before, removed in 2016, now on Ortho-Cyclen OCPs -She also continues on metformin 850 mg twice a day and spironolactone 50 mg twice a day.  We stopped her potassium supplement at last visit as her potassium level was slightly high in 07/2017.  Potassium normalized as per last check 4 months ago.  3. HL - Reviewed latest lipid panel from 01/2018: All fractions at goal Lab Results  Component Value Date   CHOL 155 02/03/2018   HDL 58.00 02/03/2018   LDLCALC 71 02/03/2018   TRIG 133.0 02/03/2018   CHOLHDL 3 02/03/2018  - Continues the statin but does have muscle cramps.  CK obtained by nephrology 07/2017 normal. -At last visit, I suggested co-Q10  4.  Obesity class III -Referred her to the weight management clinic. -Discussed about a plant-based versus a keto diet.  Philemon Kingdom, MD PhD [facility]

## 2018-06-16 ENCOUNTER — Other Ambulatory Visit: Payer: Self-pay | Admitting: Internal Medicine

## 2018-06-17 ENCOUNTER — Ambulatory Visit (INDEPENDENT_AMBULATORY_CARE_PROVIDER_SITE_OTHER): Payer: 59 | Admitting: Psychology

## 2018-06-17 ENCOUNTER — Ambulatory Visit: Payer: Self-pay | Admitting: Family

## 2018-06-17 DIAGNOSIS — F4322 Adjustment disorder with anxiety: Secondary | ICD-10-CM | POA: Diagnosis not present

## 2018-06-24 ENCOUNTER — Other Ambulatory Visit: Payer: Self-pay | Admitting: Family Medicine

## 2018-06-24 DIAGNOSIS — J209 Acute bronchitis, unspecified: Secondary | ICD-10-CM

## 2018-06-25 MED ORDER — ALBUTEROL SULFATE HFA 108 (90 BASE) MCG/ACT IN AERS: 2.0000 | INHALATION_SPRAY | Freq: Four times a day (QID) | RESPIRATORY_TRACT | 5 refills | Status: DC | PRN

## 2018-06-29 ENCOUNTER — Other Ambulatory Visit: Payer: Self-pay | Admitting: Internal Medicine

## 2018-07-07 ENCOUNTER — Ambulatory Visit: Payer: 59 | Admitting: Family

## 2018-07-07 ENCOUNTER — Encounter: Payer: Self-pay | Admitting: Family

## 2018-07-07 VITALS — BP 125/61 | HR 82 | Temp 99.1°F | Resp 16 | Ht 66.0 in | Wt 313.0 lb

## 2018-07-07 DIAGNOSIS — J45909 Unspecified asthma, uncomplicated: Secondary | ICD-10-CM

## 2018-07-07 DIAGNOSIS — I10 Essential (primary) hypertension: Secondary | ICD-10-CM | POA: Diagnosis not present

## 2018-07-07 NOTE — Progress Notes (Signed)
Subjective:    Patient ID: Audrey Goodman, female    DOB: Oct 28, 1970, 48 y.o.   MRN: [id number]  HPI  Last visit we evaluated the patient for an acute asthma exacerbation.  She was treated with albuterol and a prednisone taper. Reports that symptoms have resolved.   Hypertension- last visit blood pressure was noted to be uncontrolled.  We added amlodipine 5 mg by mouth once daily. Reports tolerating amlodipine without difficulty.  Denies LE edema.  BP Readings from Last 3 Encounters:  07/07/18 125/61  06/09/18 138/82  06/06/18 (!) 161/71     Review of Systems See HPI  Past Medical History:  Diagnosis Date  . Arthritis    left shoulder  . Depression   . Diabetes mellitus without complication ([place])    type 2  . Diverticulitis   . High cholesterol   . History of chicken pox   . Hyperlipidemia   . Hypertension   . Shingles   . Sleep apnea    uses CPAP nightly  . SVD (spontaneous vaginal delivery) 1995   x 1  . Type 2 diabetes mellitus, uncontrolled ([place]) 07/30/2006   Qualifier: Diagnosis of  By: Drue Flirt  MD, Merrily Brittle       Social History   Socioeconomic History  . Marital status: Single    Spouse name: Not on file  . Number of children: Not on file  . Years of education: Not on file  . Highest education level: Not on file  Occupational History  . Not on file  Social Needs  . Financial resource strain: Not on file  . Food insecurity:    Worry: Not on file    Inability: Not on file  . Transportation needs:    Medical: Not on file    Non-medical: Not on file  Tobacco Use  . Smoking status: Never Smoker  . Smokeless tobacco: Never Used  Substance and Sexual Activity  . Alcohol use: No    Alcohol/week: 0.0 standard drinks  . Drug use: No  . Sexual activity: Yes    Birth control/protection: Pill  Lifestyle  . Physical activity:    Days per week: Not on file    Minutes per session: Not on file  . Stress: Not on file  Relationships  . Social connections:      Talks on phone: Not on file    Gets together: Not on file    Attends religious service: Not on file    Active member of club or organization: Not on file    Attends meetings of clubs or organizations: Not on file    Relationship status: Not on file  . Intimate partner violence:    Fear of current or ex partner: Not on file    Emotionally abused: Not on file    Physically abused: Not on file    Forced sexual activity: Not on file  Other Topics Concern  . Not on file  Social History Narrative   Single- lives with boyfriend.   Has a boyfriend   Son 24- senior at [employer]   Enjoys reading   Works as a Marketing executive at [employer]     Past Surgical History:  Procedure Laterality Date  . [place]   right  . CHOLECYSTECTOMY  2008  . HYSTEROSCOPY N/A 09/13/2014   Procedure: HYSTEROSCOPY with IUD removal;  Surgeon: Linda Hedges, DO;  Location: [facility];  Service: Gynecology;  Laterality: N/A;  need longest speculum and  instruments that aresavailable due to patient's habitus (BMI ~60)    Family History  Problem Relation Age of Onset  . Cancer Mother        breast  . Hyperlipidemia Mother   . Heart disease Mother   . Hypertension Mother   . Mental illness Mother        depression  . Diabetes Mother   . Hyperlipidemia Father   . Stroke Father   . Hypertension Father   . Cancer Maternal Grandmother        colon  . Diabetes Maternal Grandmother   . Diabetes Maternal Uncle   . Diabetes Maternal Aunt   . Diabetes Maternal Uncle     Allergies  Allergen Reactions  . Lisinopril Swelling    Swelling of the left side of tongue  . Lipitor [Atorvastatin] Other (See Comments)    Myalgia/foot cramping    Current Outpatient Medications on File Prior to Visit  Medication Sig Dispense Refill  . ACCU-CHEK GUIDE test strip USE TO TEST BLOOD SUGAR 3 TIMES DAILY 300 each 1  . albuterol (PROVENTIL HFA;VENTOLIN HFA) 108 (90 Base) MCG/ACT inhaler Inhale 2 puffs into  the lungs every 6 (six) hours as needed for wheezing or shortness of breath. 18 g 5  . amLODipine (NORVASC) 5 MG tablet Take 1 tablet (5 mg total) by mouth daily. 30 tablet 3  . Blood Glucose Monitoring Suppl (ACCU-CHEK GUIDE) w/Device KIT 1 Device by Does not apply route as directed. 1 kit 1  . FLUoxetine (PROZAC) 10 MG tablet Take 1 tablet (10 mg total) by mouth daily. 90 tablet 1  . fluticasone (FLONASE) 50 MCG/ACT nasal spray Place 2 sprays into both nostrils daily. 16 g 6  . HYDROcodone-homatropine (HYCODAN) 5-1.5 MG/5ML syrup Take 5 mLs by mouth 2 (two) times daily as needed for cough. 50 mL 0  . Insulin Detemir (LEVEMIR FLEXTOUCH) 100 UNIT/ML Pen INJECT 50 UNITS SUBCUTANEOUSLY in am and 20 units 45 mL 3  . Insulin Pen Needle 32G X 4 MM MISC Use to inject insulin 4 times daily 400 each 3  . metFORMIN (GLUCOPHAGE) 850 MG tablet Take 1 tablet (850 mg total) by mouth 2 (two) times daily with a meal. 180 tablet 3  . norgestimate-ethinyl estradiol (ORTHO-CYCLEN,SPRINTEC,PREVIFEM) 0.25-35 MG-MCG tablet Take 1 tablet by mouth daily.    Marland Kitchen NOVOLOG FLEXPEN 100 UNIT/ML FlexPen INJECT 28 TO 35 UNITS INTO THE SKIN 3 TIMES DAILY BEFORE MEALS. 105 pen 0  . polyethylene glycol powder (GLYCOLAX/MIRALAX) powder Take 17 g by mouth 2 (two) times daily. 255 g 0  . rosuvastatin (CRESTOR) 10 MG tablet Take 1 tablet (10 mg total) by mouth daily. 90 tablet 1  . spironolactone (ALDACTONE) 50 MG tablet TAKE 1 TABLET (50 MG TOTAL) BY MOUTH 2 (TWO) TIMES DAILY. 177 tablet 0  . TRULICITY 1.5 NH/6.5BX SOPN INJECT 0.5ML ONCE WEEKLY UNDER THE SKIN 6 pen 0   No current facility-administered medications on file prior to visit.     BP 125/61 (BP Location: Right Arm, Patient Position: Sitting, Cuff Size: Large)   Pulse 82   Temp 99.1 F (37.3 C) (Oral)   Resp 16   Ht _0  (1.676 m)   Wt (!) 313 lb (142 kg)   SpO2 100%   BMI 50.52 kg/m       Objective:   Physical Exam Constitutional:      Appearance: She is  well-developed.  Neck:     Musculoskeletal: Neck supple.  Thyroid: No thyromegaly.  Cardiovascular:     Rate and Rhythm: Normal rate and regular rhythm.     Heart sounds: Normal heart sounds. No murmur.  Pulmonary:     Effort: Pulmonary effort is normal. No respiratory distress.     Breath sounds: Normal breath sounds. No wheezing.  Skin:    General: Skin is warm and dry.  Neurological:     Mental Status: She is alert and oriented to person, place, and time.  Psychiatric:        Behavior: Behavior normal.        Thought Content: Thought content normal.        Judgment: Judgment normal.           Assessment & Plan:  Hypertension- blood pressure is much better today on amlodipine. Tolerating without side effects. Continue same.  Asthma- stable.  No longer symptomatic. Continue albuterol as needed.

## 2018-07-07 NOTE — Patient Instructions (Signed)
Please complete lab work prior to leaving.   

## 2018-07-22 ENCOUNTER — Ambulatory Visit: Payer: 59 | Admitting: Psychology

## 2018-08-05 ENCOUNTER — Ambulatory Visit: Payer: 59 | Admitting: Psychology

## 2018-08-21 ENCOUNTER — Other Ambulatory Visit: Payer: Self-pay | Admitting: Internal Medicine

## 2018-08-21 MED ORDER — INSULIN ASPART 100 UNIT/ML FLEXPEN: PEN_INJECTOR | SUBCUTANEOUS | 4 refills | Status: DC

## 2018-08-23 ENCOUNTER — Other Ambulatory Visit: Payer: Self-pay | Admitting: Family

## 2018-08-28 DIAGNOSIS — E119 Type 2 diabetes mellitus without complications: Secondary | ICD-10-CM | POA: Diagnosis not present

## 2018-08-28 DIAGNOSIS — N182 Chronic kidney disease, stage 2 (mild): Secondary | ICD-10-CM | POA: Diagnosis not present

## 2018-08-28 DIAGNOSIS — I10 Essential (primary) hypertension: Secondary | ICD-10-CM | POA: Diagnosis not present

## 2018-09-01 ENCOUNTER — Encounter: Payer: Self-pay | Admitting: Internal Medicine

## 2018-09-02 ENCOUNTER — Encounter: Payer: Self-pay | Admitting: Internal Medicine

## 2018-09-02 ENCOUNTER — Other Ambulatory Visit: Payer: Self-pay

## 2018-09-02 ENCOUNTER — Ambulatory Visit (INDEPENDENT_AMBULATORY_CARE_PROVIDER_SITE_OTHER): Payer: 59 | Admitting: Internal Medicine

## 2018-09-02 DIAGNOSIS — E1165 Type 2 diabetes mellitus with hyperglycemia: Secondary | ICD-10-CM

## 2018-09-02 DIAGNOSIS — E282 Polycystic ovarian syndrome: Secondary | ICD-10-CM

## 2018-09-02 DIAGNOSIS — E785 Hyperlipidemia, unspecified: Secondary | ICD-10-CM

## 2018-09-02 MED ORDER — DAPAGLIFLOZIN PROPANEDIOL 5 MG PO TABS: 5.0000 mg | ORAL_TABLET | Freq: Every day | ORAL | 5 refills | Status: DC

## 2018-09-02 MED ORDER — METFORMIN HCL ER 750 MG PO TB24: 750.0000 mg | ORAL_TABLET | Freq: Two times a day (BID) | ORAL | 3 refills | Status: DC

## 2018-09-02 NOTE — Progress Notes (Signed)
Patient ID: Audrey Goodman, female   DOB: 27-Jan-1971, 48 y.o.   MRN: [id number]   Patient location: Home My location: Office  Referring Provider: [name], NP  I connected with the patient on 09/02/18 at  2:00 PM EDT by a video enabled telemedicine application and verified that I am speaking with the correct person.   I discussed the limitations of evaluation and management by telemedicine and the availability of in person appointments. The patient expressed understanding and agreed to proceed.   Details of the encounter are shown below.  HPI: Audrey Goodman is a 48 y.o.-year-old female, returning for f/u for DM2, dx with GDM in 1995, then DM 6 mo after son was born, insulin-dependent since ~2010, uncontrolled, without complications and PCOS. Last visit 3 months ago.  We scheduled this appointment after her nephrologist (Dr. Hollie Salk) recommended addition of an SGLT 2 inhibitor for delaying her CKD progression.  She joined [employer] 4 weeks ago >> sugars better >> decreased Novolog doses.  However, in the last 1 to 2 weeks, as she was working from home, she had more dietary indiscretions and sugars are much higher.  Last hemoglobin A1c was: Lab Results  Component Value Date   HGBA1C 9.6 (A) 06/09/2018   HGBA1C 8.7 (H) 02/03/2018   HGBA1C 8.5 09/16/2017   Pt is on a regimen of: - Metformin 1/2 of 850 mg 2x a day  - Trulicity 1.5 mg weekly - Levemir 50 units in am and 20 units at bedtime >> on the diet 30 units at bedtime - Novolog 15 (to 25) units depending on the size of the meal - NovoLog SSI:  - 150- 165: + 2 unit  - 166- 180: + 3 units  - 181- 195: + 4 units  - 196- 210: + 5 units  - 211- 225: + 6 units  - 226- 240: + 7 units  - 241- 255: + 8 units  - >255: + 9 units Of note, she could not tolerate Jardiance 10 (started 01/2018) because of increased urination.  Pt checks her sugars 3x a day: - am: 120-150 >> 93 (corrected CBG of 501), low 200s >> 100-126 on  the diet, 300 this am - 2h after b'fast: n/c - before lunch: 79 x1 >> 130-140 >> n/c >> 130-160 on the diet, 310 now - 2h after lunch: n/c >> 113 >> n/c - before dinner:  130s >> 150-160 >> 180-260 >> 130-160 on the diet - 2h after dinner: n/c - bedtime: n/c >> 150s >> n/c >> 150-170 - nighttime: n/c Lowest sugar was  69 (ate a small dinner) >> 93 >> 100; she has hypoglycemia awareness in the 80s. Highest sugar was 501 (streroid) >> 310.  -+ CKD- sees nephrology, last BUN/creatinine:  Lab Results  Component Value Date   BUN 16 02/03/2018   CREATININE 0.99 02/03/2018  08/02/2017: BUN/creatinine 13/0.87, GFR 93, potassium 5.3 (3.5-5.2) She is off lisinopril. She has a history of increased urinary proteins: Component     Latest Ref Rng & Units 03/28/2017  Creatinine, Urine     20 - 275 mg/dL 189  Protein/Creat Ratio     21 - 161 mg/g creat 180 (H)  Total Protein, Urine     5 - 24 mg/dL 34 (H)  Albumin     % 63  Alpha-1-Globulin, U     % 6  ALPHA-2-GLOBULIN, U     % 8  Beta Globulin, U     %  12  Gamma Globulin, U     % 11  Interpretation        Prot,24hr calculated     0 - 149 mg/24 h 480 (H)   -+ HL; last set of lipids: Lab Results  Component Value Date   CHOL 155 02/03/2018   HDL 58.00 02/03/2018   LDLCALC 71 02/03/2018   TRIG 133.0 02/03/2018   CHOLHDL 3 02/03/2018  Off Zetia.  She had leg cramps on Crestor 10.  Tried magnesium but did not help.  I recommended co-Q10 previously.  - last eye exam was: 06/2017: No DR.  Dr. Katy Fitch.  - no numbness and tingling in her feet.  She could not afford gastric bypass surgery.  PCOS: -She had irregular menses since menarche -+1 pregnancy, no miscarriages - was on Mirena IUD - for ~4.5 years >> started Sprintec in 2016.  No side effects. -She takes metformin 850 mg twice a day. -She has significant hirsutism, helped by spironolactone 50 mg twice a day.  She had a slightly elevated potassium in the past and we stopped her  potassium supplement, after which the level normalized: Lab Results  Component Value Date   K 4.3 02/03/2018   ROS: Constitutional: + Weight gain/no weight loss, no fatigue, no subjective hyperthermia, no subjective hypothermia Eyes: no blurry vision, no xerophthalmia ENT: no sore throat, no nodules palpated in neck, no dysphagia, no odynophagia, no hoarseness Cardiovascular: no CP/no SOB/no palpitations/no leg swelling Respiratory: no cough/no SOB/no wheezing Gastrointestinal: no N/no V/no D/no C/no acid reflux Musculoskeletal: no muscle aches/no joint aches Skin: no rashes, no hair loss Neurological: no tremors/no numbness/no tingling/no dizziness  I reviewed pt's medications, allergies, PMH, social hx, family hx, and changes were documented in the history of present illness. Otherwise, unchanged from my initial visit note.  Past Medical History:  Diagnosis Date  . Arthritis    left shoulder  . Depression   . Diabetes mellitus without complication ([place])    type 2  . Diverticulitis   . High cholesterol   . History of chicken pox   . Hyperlipidemia   . Hypertension   . Shingles   . Sleep apnea    uses CPAP nightly  . SVD (spontaneous vaginal delivery) 1995   x 1  . Type 2 diabetes mellitus, uncontrolled ([place]) 07/30/2006   Qualifier: Diagnosis of  By: Drue Flirt  MD, Merrily Brittle     Past Surgical History:  Procedure Laterality Date  . Stem   right  . CHOLECYSTECTOMY  2008  . HYSTEROSCOPY N/A 09/13/2014   Procedure: HYSTEROSCOPY with IUD removal;  Surgeon: Linda Hedges, DO;  Location: [facility];  Service: Gynecology;  Laterality: N/A;  need longest speculum and instruments that aresavailable due to patient's habitus (BMI ~60)   Social History   Socioeconomic History  . Marital status: Single    Spouse name: Not on file  . Number of children: Not on file  . Years of education: Not on file  . Highest education level: Not on file  Occupational History  .  Not on file  Social Needs  . Financial resource strain: Not on file  . Food insecurity:    Worry: Not on file    Inability: Not on file  . Transportation needs:    Medical: Not on file    Non-medical: Not on file  Tobacco Use  . Smoking status: Never Smoker  . Smokeless tobacco: Never Used  Substance and Sexual Activity  .  Alcohol use: No    Alcohol/week: 0.0 standard drinks  . Drug use: No  . Sexual activity: Yes    Birth control/protection: Pill  Lifestyle  . Physical activity:    Days per week: Not on file    Minutes per session: Not on file  . Stress: Not on file  Relationships  . Social connections:    Talks on phone: Not on file    Gets together: Not on file    Attends religious service: Not on file    Active member of club or organization: Not on file    Attends meetings of clubs or organizations: Not on file    Relationship status: Not on file  . Intimate partner violence:    Fear of current or ex partner: Not on file    Emotionally abused: Not on file    Physically abused: Not on file    Forced sexual activity: Not on file  Other Topics Concern  . Not on file  Social History Narrative   Single- lives with boyfriend.   Has a boyfriend   Son 51- senior at [employer]   Enjoys reading   Works as a Marketing executive at [employer] Medications on File Prior to Visit  Medication Sig Dispense Refill  . ACCU-CHEK GUIDE test strip USE TO TEST BLOOD SUGAR 3 TIMES DAILY 300 each 1  . albuterol (PROVENTIL HFA;VENTOLIN HFA) 108 (90 Base) MCG/ACT inhaler Inhale 2 puffs into the lungs every 6 (six) hours as needed for wheezing or shortness of breath. 18 g 5  . amLODipine (NORVASC) 5 MG tablet Take 1 tablet (5 mg total) by mouth daily. 30 tablet 3  . Blood Glucose Monitoring Suppl (ACCU-CHEK GUIDE) w/Device KIT 1 Device by Does not apply route as directed. 1 kit 1  . FLUoxetine (PROZAC) 10 MG tablet Take 1 tablet (10 mg total) by mouth daily. 90 tablet 1  .  fluticasone (FLONASE) 50 MCG/ACT nasal spray Place 2 sprays into both nostrils daily. 16 g 6  . HYDROcodone-homatropine (HYCODAN) 5-1.5 MG/5ML syrup Take 5 mLs by mouth 2 (two) times daily as needed for cough. 50 mL 0  . insulin aspart (NOVOLOG FLEXPEN) 100 UNIT/ML FlexPen Inject 28-35 units in the skin three times daily with meals 30 pen 4  . Insulin Detemir (LEVEMIR FLEXTOUCH) 100 UNIT/ML Pen INJECT 50 UNITS SUBCUTANEOUSLY in am and 20 units 45 mL 3  . Insulin Pen Needle 32G X 4 MM MISC Use to inject insulin 4 times daily 400 each 3  . metFORMIN (GLUCOPHAGE) 850 MG tablet Take 1 tablet (850 mg total) by mouth 2 (two) times daily with a meal. 180 tablet 3  . norgestimate-ethinyl estradiol (ORTHO-CYCLEN,SPRINTEC,PREVIFEM) 0.25-35 MG-MCG tablet Take 1 tablet by mouth daily.    . polyethylene glycol powder (GLYCOLAX/MIRALAX) powder Take 17 g by mouth 2 (two) times daily. 255 g 0  . rosuvastatin (CRESTOR) 10 MG tablet TAKE 1 TABLET BY MOUTH EVERY DAY 90 tablet 1  . spironolactone (ALDACTONE) 50 MG tablet TAKE 1 TABLET (50 MG TOTAL) BY MOUTH 2 (TWO) TIMES DAILY. 650 tablet 0  . TRULICITY 1.5 PT/4.6FK SOPN INJECT 0.5ML ONCE WEEKLY UNDER THE SKIN 6 pen 0   No current facility-administered medications on file prior to visit.    Allergies  Allergen Reactions  . Lisinopril Swelling    Swelling of the left side of tongue  . Lipitor [Atorvastatin] Other (See Comments)    Myalgia/foot cramping   Family History  Problem Relation Age of Onset  . Cancer Mother        breast  . Hyperlipidemia Mother   . Heart disease Mother   . Hypertension Mother   . Mental illness Mother        depression  . Diabetes Mother   . Hyperlipidemia Father   . Stroke Father   . Hypertension Father   . Cancer Maternal Grandmother        colon  . Diabetes Maternal Grandmother   . Diabetes Maternal Uncle   . Diabetes Maternal Aunt   . Diabetes Maternal Uncle     PE: There were no vitals taken for this visit. Wt  Readings from Last 3 Encounters:  07/07/18 (!) 313 lb (142 kg)  06/09/18 (!) 319 lb (144.7 kg)  06/06/18 (!) 320 lb (145.2 kg)   Constitutional:  in NAD  The physical exam was not performed (virtual visit).  ASSESSMENT: 1. DM2, insulin-dependent, uncontrolled, without complications  2. PCOS Component     Latest Ref Rng 10/16/2013  Testosterone     10 - 70 ng/dL 54  Sex Hormone Binding     18 - 114 nmol/L 12 (L)  Testosterone Free     0.6 - 6.8 pg/mL 15.5 (H)  Testosterone-% Free     0.4 - 2.4 % 2.9 (H)  TSH     0.35 - 4.50 uIU/mL 0.54  17-OH-Progesterone, LC/MS/MS      <8  Free T4     0.60 - 1.60 ng/dL 0.84  T3, Free     2.3 - 4.2 pg/mL 3.0   Labs confirmed PCOS (high testosterone, low 17-HO Prog, normal TFTs).  LH and FSH were not tested as she was on the Mirena IUD.  3. HL  4.  Obesity class III  PLAN:  1. DM2 - Patient with longstanding, uncontrolled, type 2 diabetes, on basal-bolus insulin regimen, with metformin, weekly GLP-1 receptor agonist and basal-bolus insulin regimen.  We tried to add Jardiance in the past but she could not tolerate this even at a low dose due to increased urination.  However, since last visit, she saw nephrology (Dr. Hollie Salk) and she recommended retrying an SGLT 2 inhibitor (for example Invokana) to help delay her CKD progression.  We scheduled this appointment to discuss this possibility. -At last visit, sugars were much higher due to the holidays and she was also on prednisone for bronchitis/asthma.  We added a lower dose Levemir at bedtime.  We discussed about a low-fat diet, more plant-based but she is limited by the fact that she does not like a lot of fruits and vegetables.  I suggested a referral to the weight management clinic but she did not do this yet. - at this visit, she tells me that after starting weight watchers, her sugars improved significantly so she had to decrease her insulin doses.  She also decrease the metformin dose, but  this mostly because it was causing her diarrhea.  However, she is now off the diet after starting to work at home and sugars are much higher, in the 300s now.  She increased her insulin but not to the previous levels.  She is planning to restart weight watchers.  I advised her to increase her Levemir for now and also increase the Metformin dose and switch to the ER formulation for better tolerance.   - we discussed about trying to add Farxiga, at a low dose and we can increase the dose if she is tolerating it.  Advised her to stay well hydrated while on this.  She agrees to try this. -  I suggested to:  Patient Instructions  Please continue: - Trulicity 1.5 mg weekly  Please change to: - Metformin ER 750 mg 2x a day  Please increase: - Levemir 40 units at bedtime - Novolog 15 to 20 units depending on the size of the meal - NovoLog SSI:  - 150- 165: + 2 unit  - 166- 180: + 3 units  - 181- 195: + 4 units  - 196- 210: + 5 units  - 211- 225: + 6 units  - 226- 240: + 7 units  - 241- 255: + 8 units  - >255: + 9 units   Please start: - Farxiga 5 mg in am, before b'fast  Please return in 3 months with your sugar log.   - We will recheck her HbA1c when she returns to the clinic - continue checking sugars at different times of the day - check 3-4x a day, rotating checks - advised for yearly eye exams >> she is UTD - Return to clinic in 3 mo with sugar log     2. PCOS -Patient with clinically evident PCOS, confirmed biochemically in 2015 -She had an IUD before, removed in 2016, now on Ortho-Cyclen OCPs, tolerating them well. -She also continues on metformin 850 mg twice a day and spironolactone 50 mg twice a day.  We stopped her potassium supplement at last visit as her potassium level was slightly high in 07/2017.  Potassium normalized as per last check in 01/2018. -We will need to recheck her potassium at next visit if we are starting an SGLT2 inhibitor.  3. HL - Reviewed latest lipid  panel from 01/2018: All fractions at goal Lab Results  Component Value Date   CHOL 155 02/03/2018   HDL 58.00 02/03/2018   LDLCALC 71 02/03/2018   TRIG 133.0 02/03/2018   CHOLHDL 3 02/03/2018  -Continues the statin but does have muscle cramps (normal CK).  I suggested co-Q10.  4.  Obesity class III -I did refer her to the weight management clinic but she did not see them yet. -At last visit we discussed about the benefits of a plant-based versus a keto diet.  She did start weight watchers and she did fairly well on this - she is now off the diet but plans to restart.  Philemon Kingdom, MD PhD [facility] Endocr[facility]

## 2018-09-02 NOTE — Patient Instructions (Addendum)
Please continue: - Trulicity 1.5 mg weekly  Please change to: - Metformin ER 750 mg 2x a day  Please increase: - Levemir 40 units at bedtime - Novolog 15 to 20 units depending on the size of the meal - NovoLog SSI:  - 150- 165: + 2 unit  - 166- 180: + 3 units  - 181- 195: + 4 units  - 196- 210: + 5 units  - 211- 225: + 6 units  - 226- 240: + 7 units  - 241- 255: + 8 units  - >255: + 9 units   Please start: - Farxiga 5 mg in am, before b'fast  Please return in 3 months with your sugar log.

## 2018-09-03 ENCOUNTER — Other Ambulatory Visit: Payer: Self-pay | Admitting: Internal Medicine

## 2018-09-03 ENCOUNTER — Telehealth: Payer: Self-pay | Admitting: Internal Medicine

## 2018-09-03 MED ORDER — FREESTYLE LIBRE 14 DAY SENSOR MISC: 1.0000 | 11 refills | Status: DC

## 2018-09-03 MED ORDER — FREESTYLE LIBRE 14 DAY READER DEVI: 1.0000 | Freq: Once | 1 refills | Status: AC

## 2018-09-03 NOTE — Telephone Encounter (Signed)
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 Received an e script from the office and needs to do follow up on the document information. This is for the [facility] and SPIRONOLACTONE"   PHONE- [phone]

## 2018-09-03 NOTE — Telephone Encounter (Signed)
A fax was received regarding this, [name] responded and I have faxed it back to the pharmacy.

## 2018-09-09 ENCOUNTER — Encounter: Payer: Self-pay | Admitting: Internal Medicine

## 2018-09-09 MED ORDER — FLUCONAZOLE 150 MG PO TABS: 150.0000 mg | ORAL_TABLET | Freq: Once | ORAL | 0 refills | Status: AC

## 2018-09-15 ENCOUNTER — Ambulatory Visit: Payer: Self-pay | Admitting: Internal Medicine

## 2018-09-17 ENCOUNTER — Other Ambulatory Visit: Payer: Self-pay | Admitting: Internal Medicine

## 2018-10-02 ENCOUNTER — Other Ambulatory Visit: Payer: Self-pay | Admitting: Family

## 2018-10-02 MED ORDER — AMLODIPINE BESYLATE 5 MG PO TABS: 5.0000 mg | ORAL_TABLET | Freq: Every day | ORAL | 3 refills | Status: DC

## 2018-10-02 NOTE — Telephone Encounter (Signed)
Copied from CRM [id number]. Topic: Quick Communication - Rx Refill/Question >> Oct 02, 2018 12:23 PM Wyonia Hough E wrote: Medication: amLODipine (NORVASC) 5 MG tablet   Has the patient contacted their pharmacy? Yes- request sent with no response   Preferred Pharmacy (with phone number or street name): [id number] [place], [place] - [address] [phone] (Phone) [phone] (Fax)    Agent: Please be advised that RX refills may take up to 3 business days. We ask that you follow-up with your pharmacy.

## 2018-10-03 ENCOUNTER — Telehealth: Payer: Self-pay

## 2018-10-03 MED ORDER — AMLODIPINE BESYLATE 5 MG PO TABS: 5.0000 mg | ORAL_TABLET | Freq: Every day | ORAL | 1 refills | Status: DC

## 2018-10-03 NOTE — Telephone Encounter (Signed)
Copied from CRM [id number]. Topic: Appointment Scheduling - Scheduling Inquiry for Clinic >> Oct 02, 2018 12:25 PM Wyonia Hough E wrote: Reason for CRM: Pt called to verify if her upcoming appt is in office or virtual / please advise

## 2018-10-03 NOTE — Telephone Encounter (Signed)
Called patient to verify appointment will be Virtual visit.

## 2018-10-03 NOTE — Addendum Note (Signed)
Addended by: Steve Rattler A on: 10/03/2018 07:44 AM   Modules accepted: Orders

## 2018-10-03 NOTE — Telephone Encounter (Signed)
Refill sent to pharmacy.   

## 2018-10-06 ENCOUNTER — Other Ambulatory Visit: Payer: Self-pay

## 2018-10-06 ENCOUNTER — Other Ambulatory Visit: Payer: Self-pay | Admitting: Internal Medicine

## 2018-10-06 ENCOUNTER — Ambulatory Visit (INDEPENDENT_AMBULATORY_CARE_PROVIDER_SITE_OTHER): Payer: 59 | Admitting: Family

## 2018-10-06 DIAGNOSIS — F32A Depression, unspecified: Secondary | ICD-10-CM

## 2018-10-06 DIAGNOSIS — I10 Essential (primary) hypertension: Secondary | ICD-10-CM

## 2018-10-06 DIAGNOSIS — F329 Major depressive disorder, single episode, unspecified: Secondary | ICD-10-CM

## 2018-10-06 DIAGNOSIS — E785 Hyperlipidemia, unspecified: Secondary | ICD-10-CM | POA: Diagnosis not present

## 2018-10-06 DIAGNOSIS — E1165 Type 2 diabetes mellitus with hyperglycemia: Secondary | ICD-10-CM | POA: Diagnosis not present

## 2018-10-06 NOTE — Progress Notes (Signed)
Virtual Visit via Video Note  I connected with@ on 10/06/18 at  7:00 AM EDT by a video enabled telemedicine application and verified that I am speaking with the correct person using two identifiers. This visit type was conducted due to national recommendations for restrictions regarding the COVID-19 Pandemic (e.g. social distancing).  This format is felt to be most appropriate for this patient at this time.   I discussed the limitations of evaluation and management by telemedicine and the availability of in person appointments. The patient expressed understanding and agreed to proceed.  Only the patient and myself were on today's video visit. The patient was at home and I was in my office at the time of today's visit.   History of Present Illness:   DM2- continues to follow with [name]. Reports that her sugar was 198 this AM. Admits that her diet has not been the best during her shelter at home time. She has not weighed herself recently.   Lab Results  Component Value Date   HGBA1C 9.6 (A) 06/09/2018   HGBA1C 8.7 (H) 02/03/2018   HGBA1C 8.5 09/16/2017   Lab Results  Component Value Date   LDLCALC 71 02/03/2018   CREATININE 0.99 02/03/2018    HTN- she ordered a bp cuff that she is waiting for. Reports good compliance with bp meds.   Depression- reports that she is working form home but mood is ok.  Continues prozac.   Hyperlipidemia-  Reports good compliance with crestor.  Lab Results  Component Value Date   CHOL 155 02/03/2018   HDL 58.00 02/03/2018   LDLCALC 71 02/03/2018   TRIG 133.0 02/03/2018   CHOLHDL 3 02/03/2018      Observations/Objective:    Gen: Awake, alert, no acute distress Resp: Breathing is even and non-labored Psych: calm/pleasant demeanor Neuro: Alert and Oriented x 3, + facial symmetry, speech is clear. Skin: + facial hair noted  Assessment and Plan:  1) DM2- reports + elevated sugars. Advised pt to continue to work on improving her diet and  adding regular exercise.    2) HTN- she will check her bp once daily for several days when her cuff arrives and will send me her readings via mychart.  3) Hyperlipidemia- tolerating statin, obtain follow up lipid panel.  4) Depression- stable on prozac.   Follow Up Instructions:   1 month for lab work (postpone due to covid). 3 months for face to face office visit.  I discussed the assessment and treatment plan with the patient. The patient was provided an opportunity to ask questions and all were answered. The patient agreed with the plan and demonstrated an understanding of the instructions.   The patient was advised to call back or seek an in-person evaluation if the symptoms worsen or if the condition fails to improve as anticipated.    Lemont Fillers, NP

## 2018-10-08 DIAGNOSIS — E119 Type 2 diabetes mellitus without complications: Secondary | ICD-10-CM | POA: Diagnosis not present

## 2018-10-08 DIAGNOSIS — H2513 Age-related nuclear cataract, bilateral: Secondary | ICD-10-CM | POA: Diagnosis not present

## 2018-10-08 LAB — HM DIABETES EYE EXAM

## 2018-10-25 ENCOUNTER — Other Ambulatory Visit: Payer: Self-pay | Admitting: Family

## 2018-11-06 ENCOUNTER — Other Ambulatory Visit (INDEPENDENT_AMBULATORY_CARE_PROVIDER_SITE_OTHER): Payer: 59

## 2018-11-06 ENCOUNTER — Other Ambulatory Visit: Payer: Self-pay

## 2018-11-06 ENCOUNTER — Encounter: Payer: Self-pay | Admitting: Family

## 2018-11-06 ENCOUNTER — Encounter: Payer: Self-pay | Admitting: Internal Medicine

## 2018-11-06 DIAGNOSIS — E1165 Type 2 diabetes mellitus with hyperglycemia: Secondary | ICD-10-CM | POA: Diagnosis not present

## 2018-11-06 DIAGNOSIS — E785 Hyperlipidemia, unspecified: Secondary | ICD-10-CM | POA: Diagnosis not present

## 2018-11-06 LAB — LIPID PANEL
Cholesterol: 155 mg/dL (ref 0–200)
HDL: 55 mg/dL (ref >39.00)
LDL Cholesterol: 70 mg/dL (ref 0–99)
NonHDL: 100.03
Total CHOL/HDL Ratio: 3
Triglycerides: 148 mg/dL (ref 0.0–149.0)
VLDL: 29.6 mg/dL (ref 0.0–40.0)

## 2018-11-06 LAB — BASIC METABOLIC PANEL
BUN: 16 mg/dL (ref 6–23)
CO2: 25 mEq/L (ref 19–32)
Calcium: 9.1 mg/dL (ref 8.4–10.5)
Chloride: 100 mEq/L (ref 96–112)
Creatinine, Ser: 0.9 mg/dL (ref 0.40–1.20)
GFR: 81.22 mL/min (ref >60.00)
Glucose, Bld: 176 mg/dL — ABNORMAL HIGH (ref 70–99)
Potassium: 4.7 mEq/L (ref 3.5–5.1)
Sodium: 136 mEq/L (ref 135–145)

## 2018-11-06 LAB — HEMOGLOBIN A1C: Hgb A1c MFr Bld: 9.2 % — ABNORMAL HIGH (ref 4.6–6.5)

## 2018-11-06 LAB — HEPATIC FUNCTION PANEL
ALT: 10 U/L (ref 0–35)
AST: 11 U/L (ref 0–37)
Albumin: 3.6 g/dL (ref 3.5–5.2)
Alkaline Phosphatase: 66 U/L (ref 39–117)
Bilirubin, Direct: 0.1 mg/dL (ref 0.0–0.3)
Total Bilirubin: 0.4 mg/dL (ref 0.2–1.2)
Total Protein: 6.3 g/dL (ref 6.0–8.3)

## 2018-11-06 LAB — MICROALBUMIN / CREATININE URINE RATIO
Creatinine,U: 128.3 mg/dL
Microalb Creat Ratio: 5.4 mg/g (ref 0.0–30.0)
Microalb, Ur: 7 mg/dL — ABNORMAL HIGH (ref 0.0–1.9)

## 2018-11-06 NOTE — Addendum Note (Signed)
Addended by: Kelle Darting A on: 11/06/2018 08:22 AM   Modules accepted: Orders

## 2018-11-07 NOTE — Telephone Encounter (Signed)
Please let pt know that her sugar remains uncontrolled.  I am going to forward lab work to Dr. Cruzita Lederer.    Also, you can let her know that the covid antibody test is available.  I would want to complete a virtual visit with her in regards to possible covid exposure if she wishes to complete antibody testing.

## 2018-11-10 NOTE — Telephone Encounter (Signed)
Patient called back and results given to her. She will hold off on virtual due to covid exposure, "she is ok at this time".

## 2018-11-10 NOTE — Telephone Encounter (Signed)
LVM for patient to call back about results and to schedule virtual visit

## 2018-12-06 ENCOUNTER — Other Ambulatory Visit: Payer: Self-pay | Admitting: Internal Medicine

## 2018-12-08 ENCOUNTER — Encounter: Payer: Self-pay | Admitting: Internal Medicine

## 2018-12-08 ENCOUNTER — Other Ambulatory Visit: Payer: Self-pay | Admitting: Internal Medicine

## 2018-12-08 MED ORDER — LEVEMIR FLEXTOUCH 100 UNIT/ML ~~LOC~~ SOPN: PEN_INJECTOR | SUBCUTANEOUS | 1 refills | Status: DC

## 2018-12-12 ENCOUNTER — Other Ambulatory Visit: Payer: Self-pay

## 2018-12-16 ENCOUNTER — Other Ambulatory Visit: Payer: Self-pay

## 2018-12-16 ENCOUNTER — Ambulatory Visit: Payer: 59 | Admitting: Internal Medicine

## 2018-12-16 ENCOUNTER — Encounter: Payer: Self-pay | Admitting: Internal Medicine

## 2018-12-16 VITALS — BP 122/70 | HR 94 | Ht 65.0 in | Wt 312.0 lb

## 2018-12-16 DIAGNOSIS — E785 Hyperlipidemia, unspecified: Secondary | ICD-10-CM

## 2018-12-16 DIAGNOSIS — E1165 Type 2 diabetes mellitus with hyperglycemia: Secondary | ICD-10-CM

## 2018-12-16 DIAGNOSIS — E282 Polycystic ovarian syndrome: Secondary | ICD-10-CM

## 2018-12-16 LAB — POCT GLYCOSYLATED HEMOGLOBIN (HGB A1C): Hemoglobin A1C: 7.8 % — AB (ref 4.0–5.6)

## 2018-12-16 NOTE — Progress Notes (Signed)
Patient ID: Audrey Goodman, female   DOB: 11-Sep-1970, 48 y.o.   MRN: [id number]   HPI: Audrey Goodman is a 48 y.o.-year-old female, returning for f/u for DM2, dx with GDM in 1995, then DM 6 mo after son was born, insulin-dependent since ~2010, uncontrolled, without complications and PCOS. Last visit 3 months ago (virtual)  We scheduled last appointment after her nephrologist (Dr. Hollie Salk) recommended addition of an SGLT 2 inhibitor for delaying her CKD progression.  We added Farxiga 5 mg in the morning.  She felt that her sugars improved afterwards.  She does have increased urination, but not bothersome.  When I last saw her, she came off her weight watchers diet and sugars greatly increased in the 1 to 2 weeks prior to our visit.  She was determined to get back on the diet.  At this visit, she is telling me that she is on a hybrid diet, but she does still have dietary indiscretions.  Last hemoglobin A1c was: Lab Results  Component Value Date   HGBA1C 9.2 (H) 11/06/2018   HGBA1C 9.6 (A) 06/09/2018   HGBA1C 8.7 (H) 02/03/2018   Pt is on a regimen of: - Metformin ER 750 mg 2x a day - Farxiga 5 mg in am, before b'fast - started 47/2458 - Trulicity 1.5 mg weekly - Levemir 40 units at bedtime - Novolog 15-20 units depending on the size of the meal - NovoLog SSI:  - 150- 165: + 2 unit  - 166- 180: + 3 units  - 181- 195: + 4 units  - 196- 210: + 5 units  - 211- 225: + 6 units  - 226- 240: + 7 units  - 241- 255: + 8 units  - >255: + 9 units Of note, she could not tolerate Jardiance 10 (started 01/2018) because of increased urination.  Pt checks her sugars 3x a day: - am:  100-126 (on diet) 300 >> 80-130, 144, 206 - 2h after b'fast: n/c >> 98-169, 204, 253 - before lunch: 130-160 (on diet), 310 >> 110, 135-180, 212, 221 - 2h after lunch: n/c >> 113 >> n/c >> 122-172, 211 - before dinner: 130-160 (on diet) >> 120-150 >> 125-163, 180 - 2h after dinner: n/c - bedtime: n/c >> 150s >> n/c >>  150-170 >> n/c - nighttime: n/c Lowest sugar was  69 (ate a small dinner) >> 93 >> 80; she has hypoglycemia awareness in the 80s Highest sugar was 501 (streroid) >> 310 >> 253.  -+ CKD-sees nephrology, last BUN/creatinine:  Lab Results  Component Value Date   BUN 16 11/06/2018   CREATININE 0.90 11/06/2018  08/02/2017: BUN/creatinine 13/0.87, GFR 93, potassium 5.3 (3.5-5.2) Off lisinopril. She has a history of proteinuria: Component     Latest Ref Rng & Units 03/28/2017  Creatinine, Urine     20 - 275 mg/dL 189  Protein/Creat Ratio     21 - 161 mg/g creat 180 (H)  Total Protein, Urine     5 - 24 mg/dL 34 (H)  Albumin     % 63  Alpha-1-Globulin, U     % 6  ALPHA-2-GLOBULIN, U     % 8  Beta Globulin, U     % 12  Gamma Globulin, U     % 11  Interpretation        Prot,24hr calculated     0 - 149 mg/24 h 480 (H)   -+ HL; last set of lipids: Lab Results  Component Value  Date   CHOL 155 11/06/2018   HDL 55.00 11/06/2018   LDLCALC 70 11/06/2018   TRIG 148.0 11/06/2018   CHOLHDL 3 11/06/2018  She had leg cramps on Crestor 10, which were not helped by a magnesium supplement.  I did recommend co-Q10 in the past.  - last eye exam was: 05/2018: No DR.  Dr. Katy Fitch.  - no numbness and tingling in her feet.  She could not afford gastric bypass surgery in the past >> now retrying.  PCOS: -She had irregular menses since menarche -+1 pregnancy, no miscarriages - was on Mirena IUD - for ~4.5 years >> now on Sprintec since 2016 without side effects. -Also on metformin ER 750 mg twice a day -Also on spironolactone 50 mg twice a day which helps with her significant hirsutism  Latest blood test Lab Results  Component Value Date   K 4.7 11/06/2018   ROS: Constitutional: no weight gain/no weight loss, no fatigue, no subjective hyperthermia, no subjective hypothermia, + increased urination Eyes: no blurry vision, no xerophthalmia ENT: no sore throat, no nodules palpated in neck, no  dysphagia, no odynophagia, no hoarseness Cardiovascular: no CP/no SOB/no palpitations/no leg swelling Respiratory: no cough/no SOB/no wheezing Gastrointestinal: no N/no V/no D/no C/no acid reflux Musculoskeletal: no muscle aches/no joint aches Skin: no rashes, no hair loss Neurological: no tremors/no numbness/no tingling/no dizziness  I reviewed pt's medications, allergies, PMH, social hx, family hx, and changes were documented in the history of present illness. Otherwise, unchanged from my initial visit note.  Past Medical History:  Diagnosis Date  . Arthritis    left shoulder  . Depression   . Diabetes mellitus without complication ([place])    type 2  . Diverticulitis   . High cholesterol   . History of chicken pox   . Hyperlipidemia   . Hypertension   . Shingles   . Sleep apnea    uses CPAP nightly  . SVD (spontaneous vaginal delivery) 1995   x 1  . Type 2 diabetes mellitus, uncontrolled ([place]) 07/30/2006   Qualifier: Diagnosis of  By: Drue Flirt  MD, Merrily Brittle     Past Surgical History:  Procedure Laterality Date  . [place]   right  . CHOLECYSTECTOMY  2008  . HYSTEROSCOPY N/A 09/13/2014   Procedure: HYSTEROSCOPY with IUD removal;  Surgeon: Linda Hedges, DO;  Location: [facility];  Service: Gynecology;  Laterality: N/A;  need longest speculum and instruments that aresavailable due to patient's habitus (BMI ~60)   Social History   Socioeconomic History  . Marital status: Single    Spouse name: Not on file  . Number of children: Not on file  . Years of education: Not on file  . Highest education level: Not on file  Occupational History  . Not on file  Social Needs  . Financial resource strain: Not on file  . Food insecurity    Worry: Not on file    Inability: Not on file  . Transportation needs    Medical: Not on file    Non-medical: Not on file  Tobacco Use  . Smoking status: Never Smoker  . Smokeless tobacco: Never Used  Substance and Sexual  Activity  . Alcohol use: No    Alcohol/week: 0.0 standard drinks  . Drug use: No  . Sexual activity: Yes    Birth control/protection: Pill  Lifestyle  . Physical activity    Days per week: Not on file    Minutes per session: Not on  file  . Stress: Not on file  Relationships  . Social Herbalist on phone: Not on file    Gets together: Not on file    Attends religious service: Not on file    Active member of club or organization: Not on file    Attends meetings of clubs or organizations: Not on file    Relationship status: Not on file  . Intimate partner violence    Fear of current or ex partner: Not on file    Emotionally abused: Not on file    Physically abused: Not on file    Forced sexual activity: Not on file  Other Topics Concern  . Not on file  Social History Narrative   Single- lives with boyfriend.   Has a boyfriend   Son 17- senior at [employer]   Enjoys reading   Works as a Marketing executive at [employer] Medications on File Prior to Visit  Medication Sig Dispense Refill  . ACCU-CHEK GUIDE test strip USE TO TEST BLOOD SUGAR 3 TIMES DAILY 300 each 1  . amLODipine (NORVASC) 5 MG tablet Take 1 tablet (5 mg total) by mouth daily. 90 tablet 1  . Blood Glucose Monitoring Suppl (ACCU-CHEK GUIDE) w/Device KIT 1 Device by Does not apply route as directed. 1 kit 1  . Continuous Blood Gluc Sensor (FREESTYLE LIBRE 14 DAY SENSOR) MISC 1 each by Does not apply route every 14 (fourteen) days. Change every 2 weeks 2 each 11  . dapagliflozin propanediol (FARXIGA) 5 MG TABS tablet Take 5 mg by mouth daily. 30 tablet 5  . FLUoxetine (PROZAC) 10 MG tablet TAKE 1 TABLET BY MOUTH EVERY DAY 90 tablet 1  . Insulin Detemir (LEVEMIR FLEXTOUCH) 100 UNIT/ML Pen Inject 40 units at bedtime. 36 mL 1  . Insulin Pen Needle 32G X 4 MM MISC Use to inject insulin 4 times daily 400 each 3  . metFORMIN (GLUCOPHAGE-XR) 750 MG 24 hr tablet Take 1 tablet (750 mg total) by mouth  2 (two) times daily. 180 tablet 3  . norgestimate-ethinyl estradiol (ORTHO-CYCLEN,SPRINTEC,PREVIFEM) 0.25-35 MG-MCG tablet Take 1 tablet by mouth daily.    Marland Kitchen NOVOLOG FLEXPEN 100 UNIT/ML FlexPen INJECT 28-35 UNITS IN THE SKIN THREE TIMES DAILY WITH MEALS 15 mL 1  . rosuvastatin (CRESTOR) 10 MG tablet TAKE 1 TABLET BY MOUTH EVERY DAY 90 tablet 1  . spironolactone (ALDACTONE) 50 MG tablet TAKE 1 TABLET BY MOUTH TWICE A DAY 492 tablet 0  . TRULICITY 1.5 EF/0.0FH SOPN INJECT 0.5ML ONCE WEEKLY UNDER THE SKIN 6 pen 2   No current facility-administered medications on file prior to visit.    Allergies  Allergen Reactions  . Lisinopril Swelling    Swelling of the left side of tongue  . Lipitor [Atorvastatin] Other (See Comments)    Myalgia/foot cramping   Family History  Problem Relation Age of Onset  . Cancer Mother        breast  . Hyperlipidemia Mother   . Heart disease Mother   . Hypertension Mother   . Mental illness Mother        depression  . Diabetes Mother   . Hyperlipidemia Father   . Stroke Father   . Hypertension Father   . Cancer Maternal Grandmother        colon  . Diabetes Maternal Grandmother   . Diabetes Maternal Uncle   . Diabetes Maternal Aunt   . Diabetes Maternal Uncle  PE: BP 122/70   Pulse 94   Ht _0  (1.651 m)   Wt (!) 312 lb (141.5 kg)   SpO2 96%   BMI 51.92 kg/m  Wt Readings from Last 3 Encounters:  12/16/18 (!) 312 lb (141.5 kg)  07/07/18 (!) 313 lb (142 kg)  06/09/18 (!) 319 lb (144.7 kg)   Constitutional: overweight, in NAD Eyes: PERRLA, EOMI, no exophthalmos ENT: moist mucous membranes, no thyromegaly, no cervical lymphadenopathy Cardiovascular: tachycardia, RR, No MRG Respiratory: CTA B Gastrointestinal: abdomen soft, NT, ND, BS+ Musculoskeletal: no deformities, strength intact in all 4 Skin: moist, warm, no rashes Neurological: no tremor with outstretched hands, DTR normal in all 4  ASSESSMENT: 1. DM2, insulin-dependent,  uncontrolled, without complications  2. PCOS Component     Latest Ref Rng 10/16/2013  Testosterone     10 - 70 ng/dL 54  Sex Hormone Binding     18 - 114 nmol/L 12 (L)  Testosterone Free     0.6 - 6.8 pg/mL 15.5 (H)  Testosterone-% Free     0.4 - 2.4 % 2.9 (H)  TSH     0.35 - 4.50 uIU/mL 0.54  17-OH-Progesterone, LC/MS/MS      <8  Free T4     0.60 - 1.60 ng/dL 0.84  T3, Free     2.3 - 4.2 pg/mL 3.0   Labs confirmed PCOS (high testosterone, low 17-HO Prog, normal TFTs).  LH and FSH were not tested as she was on the Mirena IUD.  3. HL  4.  Obesity class III  PLAN:  1. DM2 - Patient with longstanding, uncontrolled, type 2 diabetes, on basal-bolus insulin regimen, and also metformin, weekly GLP-1 receptor agonist and SGLT 2 inhibitor added at last visit.  In the past, we tried to add Jardiance but she could not tolerate this even at a low dose due to increased urination.  However, her nephrologist recommended a retrial to help delay her CKD progression. -At last visit, sugars are higher after she came off weight watchers diet but she was planning to restart.  At that time, besides adding [place], we also increased her NovoLog and changed her metformin to the ER formulation. -She had another HbA1c 2.5 months ago and this was still high, at 9.2%, but decreased from 9.6% -At this visit, she tells me that she is tolerating [place] well, with increased urination, but not bothersome.  She feels her sugars improved afterwards.  She did not restart the weight watchers diet, but she is including more fruits and vegetables and sugars are better.  She still has dietary indiscretions after which she has hyperglycemic spikes and she also occasionally forgets her insulin.  However, overall, sugars are improved.  They are consistently higher than target before lunch and this is due to more hearty breakfast.  I advised her that she may need to increase the NovoLog dose with these meals, but ideally to  improve them -She is planning to have gastric bypass and we discussed that she will most likely be able to reduce the insulin that may be long, with NovoLog after her surgery. -  I suggested to:  Patient Instructions  Please continue: - Metformin ER 750 mg 2x a day - Farxiga 5 mg in am, before b'fast - Trulicity 1.5 mg weekly - Levemir 40 units at bedtime - Novolog 15-20 (25) units depending on the size of the meal - NovoLog SSI:  - 150- 165: + 2 unit  - 166- 180: + 3  units  - 181- 195: + 4 units  - 196- 210: + 5 units  - 211- 225: + 6 units  - 226- 240: + 7 units  - 241- 255: + 8 units  - >255: + 9 units  Please return in 3-4 months with your sugar log.   - we checked her HbA1c: 7.8% (improved) - advised to check sugars at different times of the day - 3x a day, rotating check times.  She also has a CGM but just attached a new sensor yesterday so we cannot use it today - advised for yearly eye exams >> she is UTD - return to clinic in 3-4 months     2. PCOS -Patient with clinically evident PCOS, confirmed biochemically in 2015 -She was previously on an IUD, removed in 2016, then on Ortho-Cyclen OCPs-tolerating that well -She also continues on metformin 800 and grams twice a day spironolactone 50 mg twice a day.  Her potassium was slightly high 07/2017 but at that time she was on a potassium supplement, which we stopped.  Potassium level normalized afterwards -At last visit we started her on an SGLT 2 inhibitor increase her potassium.  However, she did have a BMP 10/2018 and there was no hyperkalemia.  3. HL - Reviewed latest lipid panel from 10/2018: All fractions at goal Lab Results  Component Value Date   CHOL 155 11/06/2018   HDL 55.00 11/06/2018   LDLCALC 70 11/06/2018   TRIG 148.0 11/06/2018   CHOLHDL 3 11/06/2018  - Continues the statin >> muscle cramps.  I suggested co-Q10 in the past but she did not start.  4.  Obesity class III - I referred her to weight  management clinic but she did not see them  -We did discuss about the benefits of a plant-based diet in the past but she did not try this -She needs to start weight watchers and she did fairly well on this.  At last visit he was on the diet but planning to restart. -She is also determined to get gastric bypass surgery.  She tried this in the past but it was not covered by her insurance plan, but as of now, this is covered.  Philemon Kingdom, MD PhD [facility] Endocrinology

## 2018-12-16 NOTE — Patient Instructions (Addendum)
Please continue: - Metformin ER 750 mg 2x a day - Farxiga 5 mg in am, before b'fast - Trulicity 1.5 mg weekly - Levemir 40 units at bedtime - Novolog 15-20 (25) units depending on the size of the meal - NovoLog SSI:  - 150- 165: + 2 unit  - 166- 180: + 3 units  - 181- 195: + 4 units  - 196- 210: + 5 units  - 211- 225: + 6 units  - 226- 240: + 7 units  - 241- 255: + 8 units  - >255: + 9 units  Please return in 3-4 months with your sugar log.

## 2018-12-28 ENCOUNTER — Other Ambulatory Visit: Payer: Self-pay | Admitting: Internal Medicine

## 2018-12-29 ENCOUNTER — Other Ambulatory Visit: Payer: Self-pay

## 2018-12-29 ENCOUNTER — Other Ambulatory Visit: Payer: Self-pay | Admitting: Internal Medicine

## 2018-12-29 DIAGNOSIS — E1165 Type 2 diabetes mellitus with hyperglycemia: Secondary | ICD-10-CM

## 2018-12-29 MED ORDER — NOVOLOG FLEXPEN 100 UNIT/ML ~~LOC~~ SOPN: PEN_INJECTOR | SUBCUTANEOUS | 0 refills | Status: DC

## 2018-12-29 NOTE — Progress Notes (Signed)
error 

## 2018-12-31 ENCOUNTER — Other Ambulatory Visit: Payer: Self-pay | Admitting: Internal Medicine

## 2019-01-09 ENCOUNTER — Ambulatory Visit: Payer: 59 | Admitting: Family

## 2019-01-26 ENCOUNTER — Ambulatory Visit (INDEPENDENT_AMBULATORY_CARE_PROVIDER_SITE_OTHER): Payer: 59 | Admitting: Psychology

## 2019-01-26 DIAGNOSIS — F4322 Adjustment disorder with anxiety: Secondary | ICD-10-CM | POA: Diagnosis not present

## 2019-01-28 ENCOUNTER — Other Ambulatory Visit: Payer: Self-pay | Admitting: Internal Medicine

## 2019-02-01 ENCOUNTER — Other Ambulatory Visit: Payer: Self-pay | Admitting: Family

## 2019-02-03 ENCOUNTER — Encounter: Payer: Self-pay | Admitting: Family

## 2019-02-03 ENCOUNTER — Other Ambulatory Visit: Payer: Self-pay | Admitting: Internal Medicine

## 2019-02-03 ENCOUNTER — Encounter: Payer: Self-pay | Admitting: Internal Medicine

## 2019-02-03 MED ORDER — FARXIGA 10 MG PO TABS: 5.0000 mg | ORAL_TABLET | Freq: Every day | ORAL | 3 refills | Status: DC

## 2019-02-05 ENCOUNTER — Other Ambulatory Visit: Payer: Self-pay

## 2019-02-06 ENCOUNTER — Ambulatory Visit: Payer: 59 | Admitting: Family

## 2019-02-06 ENCOUNTER — Encounter: Payer: Self-pay | Admitting: Family

## 2019-02-06 VITALS — BP 126/80 | HR 99 | Temp 97.4°F | Resp 18 | Ht 65.0 in | Wt 315.0 lb

## 2019-02-06 DIAGNOSIS — Z23 Encounter for immunization: Secondary | ICD-10-CM

## 2019-02-06 DIAGNOSIS — H6983 Other specified disorders of Eustachian tube, bilateral: Secondary | ICD-10-CM | POA: Diagnosis not present

## 2019-02-06 MED ORDER — FLUTICASONE PROPIONATE 50 MCG/ACT NA SUSP: 2.0000 | Freq: Every day | NASAL | 6 refills | Status: DC

## 2019-02-06 NOTE — Patient Instructions (Addendum)
Please add flonase 2 sprays to each nostril once daily. Continue zyrtec once daily.  Call if symptoms worsen or if not improved in 1 week.

## 2019-02-06 NOTE — Progress Notes (Signed)
Subjective:    Patient ID: Audrey Goodman, female    DOB: 02-13-1971, 48 y.o.   MRN: [id number]  HPI  Patient is a 48 yr old female who presents today for follow up.  Reports bilateral ear fullness x 2 week. Occasional right ear pain. Feels like hearing is affected.  Using zyrtec, has chronic mild sinus congestion which is at baseline.   DM2- following with endocrinology.  Lab Results  Component Value Date   HGBA1C 7.8 (A) 12/16/2018   HGBA1C 9.2 (H) 11/06/2018   HGBA1C 9.6 (A) 06/09/2018   Lab Results  Component Value Date   MICROALBUR 7.0 (H) 11/06/2018   LDLCALC 70 11/06/2018   CREATININE 0.90 11/06/2018       Review of Systems    see HPI  Past Medical History:  Diagnosis Date  . Arthritis    left shoulder  . Depression   . Diabetes mellitus without complication ([place])    type 2  . Diverticulitis   . High cholesterol   . History of chicken pox   . Hyperlipidemia   . Hypertension   . Shingles   . Sleep apnea    uses CPAP nightly  . SVD (spontaneous vaginal delivery) 1995   x 1  . Type 2 diabetes mellitus, uncontrolled ([place]) 07/30/2006   Qualifier: Diagnosis of  By: Drue Flirt  MD, Merrily Brittle       Social History   Socioeconomic History  . Marital status: Single    Spouse name: Not on file  . Number of children: Not on file  . Years of education: Not on file  . Highest education level: Not on file  Occupational History  . Not on file  Social Needs  . Financial resource strain: Not on file  . Food insecurity    Worry: Not on file    Inability: Not on file  . Transportation needs    Medical: Not on file    Non-medical: Not on file  Tobacco Use  . Smoking status: Never Smoker  . Smokeless tobacco: Never Used  Substance and Sexual Activity  . Alcohol use: No    Alcohol/week: 0.0 standard drinks  . Drug use: No  . Sexual activity: Yes    Birth control/protection: Pill  Lifestyle  . Physical activity    Days per week: Not on file    Minutes per  session: Not on file  . Stress: Not on file  Relationships  . Social Herbalist on phone: Not on file    Gets together: Not on file    Attends religious service: Not on file    Active member of club or organization: Not on file    Attends meetings of clubs or organizations: Not on file    Relationship status: Not on file  . Intimate partner violence    Fear of current or ex partner: Not on file    Emotionally abused: Not on file    Physically abused: Not on file    Forced sexual activity: Not on file  Other Topics Concern  . Not on file  Social History Narrative   Single- lives with boyfriend.   Has a boyfriend   Son 90- senior at [employer]   Enjoys reading   Works as a Marketing executive at [employer]     Past Surgical History:  Procedure Laterality Date  . [place]   right  . CHOLECYSTECTOMY  2008  . HYSTEROSCOPY N/A 09/13/2014  Procedure: HYSTEROSCOPY with IUD removal;  Surgeon: Linda Hedges, DO;  Location: [facility];  Service: Gynecology;  Laterality: N/A;  need longest speculum and instruments that aresavailable due to patient's habitus (BMI ~60)    Family History  Problem Relation Age of Onset  . Cancer Mother        breast  . Hyperlipidemia Mother   . Heart disease Mother   . Hypertension Mother   . Mental illness Mother        depression  . Diabetes Mother   . Hyperlipidemia Father   . Stroke Father   . Hypertension Father   . Cancer Maternal Grandmother        colon  . Diabetes Maternal Grandmother   . Diabetes Maternal Uncle   . Diabetes Maternal Aunt   . Diabetes Maternal Uncle     Allergies  Allergen Reactions  . Lisinopril Swelling    Swelling of the left side of tongue  . Lipitor [Atorvastatin] Other (See Comments)    Myalgia/foot cramping    Current Outpatient Medications on File Prior to Visit  Medication Sig Dispense Refill  . ACCU-CHEK GUIDE test strip USE TO TEST BLOOD SUGAR 3 TIMES DAILY 300 each 1  .  amLODipine (NORVASC) 5 MG tablet Take 1 tablet (5 mg total) by mouth daily. 90 tablet 1  . Blood Glucose Monitoring Suppl (ACCU-CHEK GUIDE) w/Device KIT 1 Device by Does not apply route as directed. 1 kit 1  . dapagliflozin propanediol (FARXIGA) 10 MG TABS tablet Take 5-10 mg by mouth daily before breakfast. 90 tablet 3  . FARXIGA 5 MG TABS tablet TAKE 1 TABLET BY MOUTH EVERY DAY 90 tablet 1  . FLUoxetine (PROZAC) 10 MG tablet TAKE 1 TABLET BY MOUTH EVERY DAY 90 tablet 1  . insulin aspart (NOVOLOG FLEXPEN) 100 UNIT/ML FlexPen Inject 15-25 units depending on size of meal SSI:  - 150- 165: + 2 unit  - 166- 180: + 3 units  - 181- 195: + 4 units  - 196- 210: + 5 units  - 211- 225: + 6 units  - 226- 240: + 7 units  - 241- 255: + 8 units  - >255: + 9 units 25 mL 0  . Insulin Detemir (LEVEMIR FLEXTOUCH) 100 UNIT/ML Pen Inject 40 units at bedtime. 36 mL 1  . Insulin Pen Needle 32G X 4 MM MISC Use to inject insulin 4 times daily 400 each 3  . metFORMIN (GLUCOPHAGE-XR) 750 MG 24 hr tablet Take 1 tablet (750 mg total) by mouth 2 (two) times daily. 180 tablet 3  . rosuvastatin (CRESTOR) 10 MG tablet TAKE 1 TABLET BY MOUTH EVERY DAY 90 tablet 1  . spironolactone (ALDACTONE) 50 MG tablet TAKE 1 TABLET BY MOUTH TWICE A DAY 527 tablet 0  . TRULICITY 1.5 PO/2.4MP SOPN INJECT 0.5ML ONCE WEEKLY UNDER THE SKIN 6 pen 2   No current facility-administered medications on file prior to visit.     BP 126/80 (BP Location: Right Arm, Patient Position: Sitting, Cuff Size: Large)   Pulse 99   Temp (!) 97.4 F (36.3 C) (Temporal)   Resp 18   Ht _0  (1.651 m)   Wt (!) 315 lb (142.9 kg)   SpO2 97%   BMI 52.42 kg/m    Objective:   Physical Exam Constitutional:      Appearance: She is well-developed.  HENT:     Right Ear: Tympanic membrane and ear canal normal.     Left Ear: Tympanic  membrane and ear canal normal.     Ears:     Comments: Clear fluid noted behind both TM's without erythema Neck:      Musculoskeletal: Neck supple.     Thyroid: No thyromegaly.  Cardiovascular:     Rate and Rhythm: Normal rate and regular rhythm.     Heart sounds: Normal heart sounds. No murmur.  Pulmonary:     Effort: Pulmonary effort is normal. No respiratory distress.     Breath sounds: Normal breath sounds. No wheezing.  Skin:    General: Skin is warm and dry.  Neurological:     Mental Status: She is alert and oriented to person, place, and time.  Psychiatric:        Behavior: Behavior normal.        Thought Content: Thought content normal.        Judgment: Judgment normal.           Assessment & Plan:  Eustachian tube dysfunction- new.   advised pt as follows:  Please add flonase 2 sprays to each nostril once daily. Continue zyrtec once daily.  Call if symptoms worsen or if not improved in 1 week.

## 2019-02-12 ENCOUNTER — Encounter: Payer: Self-pay | Admitting: Family

## 2019-02-12 MED ORDER — AMOXICILLIN 500 MG PO CAPS: 500.0000 mg | ORAL_CAPSULE | Freq: Three times a day (TID) | ORAL | 0 refills | Status: DC

## 2019-02-15 ENCOUNTER — Other Ambulatory Visit: Payer: Self-pay | Admitting: Family

## 2019-02-17 ENCOUNTER — Ambulatory Visit (INDEPENDENT_AMBULATORY_CARE_PROVIDER_SITE_OTHER): Payer: 59 | Admitting: Psychology

## 2019-02-17 DIAGNOSIS — F4322 Adjustment disorder with anxiety: Secondary | ICD-10-CM

## 2019-02-19 ENCOUNTER — Other Ambulatory Visit: Payer: Self-pay | Admitting: Internal Medicine

## 2019-02-19 ENCOUNTER — Encounter: Payer: Self-pay | Admitting: Family

## 2019-02-19 DIAGNOSIS — E1165 Type 2 diabetes mellitus with hyperglycemia: Secondary | ICD-10-CM

## 2019-02-19 MED ORDER — NOVOLOG FLEXPEN 100 UNIT/ML ~~LOC~~ SOPN: PEN_INJECTOR | SUBCUTANEOUS | 0 refills | Status: DC

## 2019-02-20 ENCOUNTER — Encounter: Payer: Self-pay | Admitting: Family

## 2019-02-20 NOTE — Telephone Encounter (Signed)
Patient got her medication

## 2019-02-24 ENCOUNTER — Encounter: Payer: Self-pay | Admitting: Family

## 2019-02-24 DIAGNOSIS — H9203 Otalgia, bilateral: Secondary | ICD-10-CM

## 2019-03-18 ENCOUNTER — Ambulatory Visit: Payer: 59 | Admitting: Psychology

## 2019-03-31 ENCOUNTER — Other Ambulatory Visit: Payer: Self-pay | Admitting: Internal Medicine

## 2019-04-07 ENCOUNTER — Other Ambulatory Visit: Payer: Self-pay | Admitting: Internal Medicine

## 2019-04-07 DIAGNOSIS — E1165 Type 2 diabetes mellitus with hyperglycemia: Secondary | ICD-10-CM

## 2019-04-08 MED ORDER — NOVOLOG FLEXPEN 100 UNIT/ML ~~LOC~~ SOPN: PEN_INJECTOR | SUBCUTANEOUS | 4 refills | Status: DC

## 2019-04-19 ENCOUNTER — Other Ambulatory Visit: Payer: Self-pay | Admitting: Internal Medicine

## 2019-04-21 ENCOUNTER — Ambulatory Visit (INDEPENDENT_AMBULATORY_CARE_PROVIDER_SITE_OTHER): Payer: 59 | Admitting: Internal Medicine

## 2019-04-21 ENCOUNTER — Other Ambulatory Visit: Payer: Self-pay

## 2019-04-21 ENCOUNTER — Encounter: Payer: Self-pay | Admitting: Internal Medicine

## 2019-04-21 VITALS — BP 122/60 | HR 82 | Ht 65.0 in | Wt 314.0 lb

## 2019-04-21 DIAGNOSIS — E785 Hyperlipidemia, unspecified: Secondary | ICD-10-CM | POA: Diagnosis not present

## 2019-04-21 DIAGNOSIS — E282 Polycystic ovarian syndrome: Secondary | ICD-10-CM | POA: Diagnosis not present

## 2019-04-21 DIAGNOSIS — E1165 Type 2 diabetes mellitus with hyperglycemia: Secondary | ICD-10-CM | POA: Diagnosis not present

## 2019-04-21 LAB — POCT GLYCOSYLATED HEMOGLOBIN (HGB A1C): Hemoglobin A1C: 6.5 % — AB (ref 4.0–5.6)

## 2019-04-21 MED ORDER — SPIRONOLACTONE 50 MG PO TABS: 50.0000 mg | ORAL_TABLET | Freq: Two times a day (BID) | ORAL | 0 refills | Status: DC

## 2019-04-21 MED ORDER — FARXIGA 10 MG PO TABS: 10.0000 mg | ORAL_TABLET | Freq: Every day | ORAL | 3 refills | Status: DC

## 2019-04-21 MED ORDER — SPIRONOLACTONE 50 MG PO TABS: 50.0000 mg | ORAL_TABLET | Freq: Two times a day (BID) | ORAL | 3 refills | Status: DC

## 2019-04-21 NOTE — Progress Notes (Signed)
Patient ID: Audrey Goodman, female   DOB: 1970-11-09, 48 y.o.   MRN: [id number]   This visit occurred during the SARS-CoV-2 public health emergency.  Safety protocols were in place, including screening questions prior to the visit, additional usage of staff PPE, and extensive cleaning of exam room while observing appropriate contact time as indicated for disinfecting solutions.   HPI: Audrey Goodman is a 48 y.o.-year-old female, returning for f/u for DM2, dx with GDM in 1995, then DM 6 mo after son was born, insulin-dependent since ~2010, uncontrolled, without complications and PCOS. Last visit 4 months ago.  Earlier in the year, her nephrologist (Dr. Hollie Salk) recommended addition of an SGLT 2 inhibitor for delaying her CKD progression.  We added Farxiga at that time. She increased the dose since last visit >> she is tolerating this well.  Reviewed HbA1c levels: Lab Results  Component Value Date   HGBA1C 7.8 (A) 12/16/2018   HGBA1C 9.2 (H) 11/06/2018   HGBA1C 9.6 (A) 06/09/2018   Pt is on a regimen of: - Metformin ER 750 mg 2x a day - Farxiga 5 >> 10 mg in am, before b'fast - Trulicity 1.5 mg weekly - Levemir 40 units at bedtime - Novolog 15-25 units depending on the size of the meal - NovoLog SSI:  - 150- 165: + 2 unit  - 166- 180: + 3 units  - 181- 195: + 4 units  - 196- 210: + 5 units  - 211- 225: + 6 units  - 226- 240: + 7 units  - 241- 255: + 8 units  - >255: + 9 units Of note, she could not tolerate Jardiance 10 (started 01/2018) because of increased urination.  Pt checks her sugars 3 times a day: - am:  100-126 (on diet) 300 >> 80-130, 144, 206 >> 97-164 (snack at night) - 2h after b'fast: n/c >> 98-169, 204, 253 >> 110-192 - before lunch: 130-160, 310 >> 110, 135-180, 212, 221 >> 116-147, 197 - 2h after lunch: n/c >> 113 >> n/c >> 122-172, 211 >> 114-173 - before dinner: 130-160 >> 120-150 >> 125-163, 180 >> 100-151, 170 - 2h after dinner: n/c  - bedtime: n/c >> 150s >>  n/c >> 150-170 >> n/c >> 109-158, 251 (Chinese) - nighttime: n/c Lowest sugar was  69 (ate a small dinner) >> 93 >> 80 >> 97; she has hypoglycemia awareness in the 80s. Highest sugar was 501 (streroid) >> 310 >> 253 >> 251.  + CKD sees nephrology, last BUN/creatinine:  Lab Results  Component Value Date   BUN 16 11/06/2018   CREATININE 0.90 11/06/2018  08/02/2017: BUN/creatinine 13/0.87, GFR 93, potassium 5.3 (3.5-5.2) Off lisinopril. She has a history of proteinuria: Component     Latest Ref Rng & Units 03/28/2017  Creatinine, Urine     20 - 275 mg/dL 189  Protein/Creat Ratio     21 - 161 mg/g creat 180 (H)  Total Protein, Urine     5 - 24 mg/dL 34 (H)  Albumin     % 63  Alpha-1-Globulin, U     % 6  ALPHA-2-GLOBULIN, U     % 8  Beta Globulin, U     % 12  Gamma Globulin, U     % 11  Interpretation        Prot,24hr calculated     0 - 149 mg/24 h 480 (H)   + HL; last set of lipids: Lab Results  Component Value Date  CHOL 155 11/06/2018   HDL 55.00 11/06/2018   LDLCALC 70 11/06/2018   TRIG 148.0 11/06/2018   CHOLHDL 3 11/06/2018  She had leg cramps with Crestor 10.  I did recommend co-Q10 in the past but did not start.  - last eye exam was: 05/2018: no DR; Dr. Katy Fitch.  -She denies numbness and tingling in her feet.  She is planning to have gastric bypass surgery.  PCOS: -She had irregular menses since menarche -+1 pregnancies, no miscarriages - was on Mirena IUD - for ~4.5 years >> now on Sprintec since 2016 without side effects -Also continues Metformin ER 750 mg twice a day and spironolactone 50 mg twice a day-this helps with her significant hirsutism  Latest potassium level was normal: Lab Results  Component Value Date   K 4.7 11/06/2018   ROS: Constitutional: no weight gain/no weight loss, no fatigue, no subjective hyperthermia, no subjective hypothermia Eyes: no blurry vision, no xerophthalmia ENT: no sore throat, no nodules palpated in neck, no  dysphagia, no odynophagia, no hoarseness Cardiovascular: no CP/no SOB/no palpitations/no leg swelling Respiratory: no cough/no SOB/no wheezing Gastrointestinal: no N/no V/no D/no C/no acid reflux Musculoskeletal: no muscle aches/no joint aches Skin: no rashes, no hair loss Neurological: no tremors/no numbness/no tingling/no dizziness  I reviewed pt's medications, allergies, PMH, social hx, family hx, and changes were documented in the history of present illness. Otherwise, unchanged from my initial visit note.  Past Medical History:  Diagnosis Date  . Arthritis    left shoulder  . Depression   . Diabetes mellitus without complication ([place])    type 2  . Diverticulitis   . High cholesterol   . History of chicken pox   . Hyperlipidemia   . Hypertension   . Shingles   . Sleep apnea    uses CPAP nightly  . SVD (spontaneous vaginal delivery) 1995   x 1  . Type 2 diabetes mellitus, uncontrolled ([place]) 07/30/2006   Qualifier: Diagnosis of  By: Drue Flirt  MD, Merrily Brittle     Past Surgical History:  Procedure Laterality Date  . [place]   right  . CHOLECYSTECTOMY  2008  . HYSTEROSCOPY N/A 09/13/2014   Procedure: HYSTEROSCOPY with IUD removal;  Surgeon: Linda Hedges, DO;  Location: [facility];  Service: Gynecology;  Laterality: N/A;  need longest speculum and instruments that aresavailable due to patient's habitus (BMI ~60)   Social History   Socioeconomic History  . Marital status: Single    Spouse name: Not on file  . Number of children: Not on file  . Years of education: Not on file  . Highest education level: Not on file  Occupational History  . Not on file  Social Needs  . Financial resource strain: Not on file  . Food insecurity    Worry: Not on file    Inability: Not on file  . Transportation needs    Medical: Not on file    Non-medical: Not on file  Tobacco Use  . Smoking status: Never Smoker  . Smokeless tobacco: Never Used  Substance and Sexual  Activity  . Alcohol use: No    Alcohol/week: 0.0 standard drinks  . Drug use: No  . Sexual activity: Yes    Birth control/protection: Pill  Lifestyle  . Physical activity    Days per week: Not on file    Minutes per session: Not on file  . Stress: Not on file  Relationships  . Social connections  Talks on phone: Not on file    Gets together: Not on file    Attends religious service: Not on file    Active member of club or organization: Not on file    Attends meetings of clubs or organizations: Not on file    Relationship status: Not on file  . Intimate partner violence    Fear of current or ex partner: Not on file    Emotionally abused: Not on file    Physically abused: Not on file    Forced sexual activity: Not on file  Other Topics Concern  . Not on file  Social History Narrative   Single- lives with boyfriend.   Has a boyfriend   Son 12- senior at [employer]   Enjoys reading   Works as a Marketing executive at [employer] Medications on File Prior to Visit  Medication Sig Dispense Refill  . ACCU-CHEK GUIDE test strip USE TO TEST BLOOD SUGAR 3 TIMES DAILY 300 each 1  . amLODipine (NORVASC) 5 MG tablet TAKE 1 TABLET BY MOUTH EVERY DAY 90 tablet 1  . amoxicillin (AMOXIL) 500 MG capsule Take 1 capsule (500 mg total) by mouth 3 (three) times daily. 30 capsule 0  . Blood Glucose Monitoring Suppl (ACCU-CHEK GUIDE) w/Device KIT 1 Device by Does not apply route as directed. 1 kit 1  . dapagliflozin propanediol (FARXIGA) 10 MG TABS tablet Take 5-10 mg by mouth daily before breakfast. 90 tablet 3  . FARXIGA 5 MG TABS tablet TAKE 1 TABLET BY MOUTH EVERY DAY 90 tablet 1  . FLUoxetine (PROZAC) 10 MG tablet TAKE 1 TABLET BY MOUTH EVERY DAY 90 tablet 1  . fluticasone (FLONASE) 50 MCG/ACT nasal spray Place 2 sprays into both nostrils daily. 16 g 6  . insulin aspart (NOVOLOG FLEXPEN) 100 UNIT/ML FlexPen Inject 15-25 units depending on size of meal 25 mL 4  . Insulin  Detemir (LEVEMIR FLEXTOUCH) 100 UNIT/ML Pen Inject 40 units at bedtime. 45 mL 2  . Insulin Pen Needle 32G X 4 MM MISC Use to inject insulin 4 times daily 400 each 3  . metFORMIN (GLUCOPHAGE-XR) 750 MG 24 hr tablet Take 1 tablet (750 mg total) by mouth 2 (two) times daily. 180 tablet 3  . rosuvastatin (CRESTOR) 10 MG tablet TAKE 1 TABLET BY MOUTH EVERY DAY 90 tablet 1  . spironolactone (ALDACTONE) 50 MG tablet TAKE 1 TABLET BY MOUTH TWICE A DAY 409 tablet 0  . TRULICITY 1.5 WJ/1.9JY SOPN INJECT CONTENTS OF 1 PEN (1.5 MG) UNDER THE SKIN ONCE WEEKLY 2 pen 4   No current facility-administered medications on file prior to visit.    Allergies  Allergen Reactions  . Lisinopril Swelling    Swelling of the left side of tongue  . Lipitor [Atorvastatin] Other (See Comments)    Myalgia/foot cramping   Family History  Problem Relation Age of Onset  . Cancer Mother        breast  . Hyperlipidemia Mother   . Heart disease Mother   . Hypertension Mother   . Mental illness Mother        depression  . Diabetes Mother   . Hyperlipidemia Father   . Stroke Father   . Hypertension Father   . Cancer Maternal Grandmother        colon  . Diabetes Maternal Grandmother   . Diabetes Maternal Uncle   . Diabetes Maternal Aunt   . Diabetes Maternal Uncle  PE: BP 122/60   Pulse 82   Ht 5' 5"  (1.651 m)   Wt (!) 314 lb (142.4 kg)   SpO2 99%   BMI 52.25 kg/m  Wt Readings from Last 3 Encounters:  04/21/19 (!) 314 lb (142.4 kg)  02/06/19 (!) 315 lb (142.9 kg)  12/16/18 (!) 312 lb (141.5 kg)   Constitutional: overweight, in NAD Eyes: PERRLA, EOMI, no exophthalmos ENT: moist mucous membranes, no thyromegaly, no cervical lymphadenopathy Cardiovascular: RRR, No MRG Respiratory: CTA B Gastrointestinal: abdomen soft, NT, ND, BS+ Musculoskeletal: no deformities, strength intact in all 4 Skin: moist, warm, no rashes Neurological: no tremor with outstretched hands, DTR normal in all  4  ASSESSMENT: 1. DM2, insulin-dependent, uncontrolled, without complications  2. PCOS Component     Latest Ref Rng 10/16/2013  Testosterone     10 - 70 ng/dL 54  Sex Hormone Binding     18 - 114 nmol/L 12 (L)  Testosterone Free     0.6 - 6.8 pg/mL 15.5 (H)  Testosterone-% Free     0.4 - 2.4 % 2.9 (H)  TSH     0.35 - 4.50 uIU/mL 0.54  17-OH-Progesterone, LC/MS/MS      <8  Free T4     0.60 - 1.60 ng/dL 0.84  T3, Free     2.3 - 4.2 pg/mL 3.0   Labs confirmed PCOS (high testosterone, low 17-HO Prog, normal TFTs).  LH and FSH were not tested as she was on the Mirena IUD.  3. HL  PLAN:  1. DM2 - Patient with longstanding, uncontrolled, type 2 diabetes, on a complex medication regimen containing: Basal-bolus insulin, Metformin, weekly GLP-1 receptor agonist and also daily SGLT2 inhibitor Wilder Glade, as she could not tolerate Jardiance in the past even at a low dose due to increased urination).  However, her nephrologist recommended a retrial to help delay her CKD progression.  So far she is tolerating [place] well. -At last visit, sugars improved slightly but she did not restart weight watchers diet and she still had many dietary indiscretions after which she had hyperglycemic spikes.  She was also forgetting some of her insulin doses.  Her sugars were higher consistently before lunch due to like her breakfast and we discussed about maybe increasing NovoLog doses with this meal, but ideally to improve them.  At that time, she was considering gastric bypass and we discussed that we most likely will be able to reduce her insulin doses after surgery. -At this visit, sugars are mostly controlled and she tells me that she feels that this is due to increasing the dose of Farxiga to 10 mg daily.  She is tolerating this well and would like to continue with this. -Sugars are occasionally above target in the morning and she tells me that she is snacking at night >> strongly advised her to stop. -We  will not change her regimen at this visit but that next visit, we may be able to decrease her doses of NovoLog and I would also want to increase her Trulicity to 3 mg weekly. -  I suggested to:  Patient Instructions  STOP SNACKING AT NIGHT!  Please continue: - Metformin ER 750 mg 2x a day - Farxiga 10 mg in am, before b'fast - Trulicity 1.5 mg weekly - Levemir 40 units at bedtime - Novolog 15-25 units depending on the size of the meal - NovoLog SSI:  - 150- 165: + 2 unit  - 166- 180: + 3 units  - 181-  195: + 4 units  - 196- 210: + 5 units  - 211- 225: + 6 units  - 226- 240: + 7 units  - 241- 255: + 8 units  - >255: + 9 units   Targets for bedtime: 100-150.  Please return in 4 months with your sugar log.   - we checked her HbA1c: 6.5% (much better) - advised to check sugars at different times of the day - 4x a day, rotating check times - advised for yearly eye exams >> she is UTD - return to clinic in 3-4 months     2. PCOS -Patient with clinically evident PCOS, confirmed biochemically in 2015 -She was previously on an IUD, removed in 2016,-then on Ortho-Cyclen OCPs-tolerating them well -She also continues on Metformin ER 750 mg twice a day and spironolactone 500 g twice a day. Her potassium was slightly high 07/2017 but at that time she was on a potassium supplement, which we stopped.  Potassium normalized afterwards. -She is on SGLT2 inhibitor which can also increase potassium, however, in 10/2018, potassium level was normal.  We will continue to keep an eye on this  3. HL -Reviewed latest lipid panel from 10/2018: All fractions at goal Lab Results  Component Value Date   CHOL 155 11/06/2018   HDL 55.00 11/06/2018   LDLCALC 70 11/06/2018   TRIG 148.0 11/06/2018   CHOLHDL 3 11/06/2018  -She continues the statin but has muscle cramps.  I suggested co-Q10 in the past, but she did not start this  Philemon Kingdom, MD PhD [facility]

## 2019-04-21 NOTE — Patient Instructions (Addendum)
STOP SNACKING AT NIGHT!  Please continue: - Metformin ER 750 mg 2x a day - Farxiga 10 mg in am, before b'fast - Trulicity 1.5 mg weekly - Levemir 40 units at bedtime - Novolog 15-25 units depending on the size of the meal - NovoLog SSI:  - 150- 165: + 2 unit  - 166- 180: + 3 units  - 181- 195: + 4 units  - 196- 210: + 5 units  - 211- 225: + 6 units  - 226- 240: + 7 units  - 241- 255: + 8 units  - >255: + 9 units   Targets for bedtime: 100-150.  Please return in 4 months with your sugar log.

## 2019-04-21 NOTE — Addendum Note (Signed)
Addended by: Cardell Peach I on: 04/21/2019 04:04 PM   Modules accepted: Orders

## 2019-04-26 ENCOUNTER — Encounter: Payer: Self-pay | Admitting: Internal Medicine

## 2019-05-04 ENCOUNTER — Encounter: Payer: Self-pay | Admitting: Family

## 2019-05-12 ENCOUNTER — Encounter: Payer: Self-pay | Admitting: Internal Medicine

## 2019-05-12 DIAGNOSIS — E1165 Type 2 diabetes mellitus with hyperglycemia: Secondary | ICD-10-CM

## 2019-05-12 MED ORDER — NOVOLOG FLEXPEN 100 UNIT/ML ~~LOC~~ SOPN: PEN_INJECTOR | SUBCUTANEOUS | 4 refills | Status: DC

## 2019-05-13 ENCOUNTER — Encounter: Payer: Self-pay | Admitting: Family

## 2019-05-16 ENCOUNTER — Other Ambulatory Visit: Payer: Self-pay | Admitting: Nurse Practitioner

## 2019-05-27 ENCOUNTER — Encounter: Payer: Self-pay | Admitting: Family

## 2019-05-30 ENCOUNTER — Encounter: Payer: Self-pay | Admitting: Internal Medicine

## 2019-06-01 MED ORDER — ACCU-CHEK GUIDE VI STRP: ORAL_STRIP | 11 refills | Status: DC

## 2019-06-07 ENCOUNTER — Encounter: Payer: Self-pay | Admitting: Family

## 2019-06-08 ENCOUNTER — Encounter: Payer: Self-pay | Admitting: Internal Medicine

## 2019-06-08 ENCOUNTER — Telehealth: Payer: Self-pay

## 2019-06-08 ENCOUNTER — Encounter: Payer: Self-pay | Admitting: Family

## 2019-06-08 NOTE — Telephone Encounter (Signed)
Prior authorization for Trulicity has been approved by patient's insurance.  Coverage is effective 06/08/2019 to 06/07/2022   Approval letter has been sent to [employer]. Trulicity

## 2019-06-09 MED ORDER — TRULICITY 1.5 MG/0.5ML ~~LOC~~ SOAJ: SUBCUTANEOUS | 1 refills | Status: DC

## 2019-06-09 NOTE — Telephone Encounter (Signed)
See pt email from 06/09/19 stating request was sent to [name] in error.

## 2019-06-30 ENCOUNTER — Other Ambulatory Visit: Payer: Self-pay | Admitting: Nurse Practitioner

## 2019-07-03 ENCOUNTER — Encounter: Payer: Self-pay | Admitting: Family

## 2019-07-03 MED ORDER — FLUTICASONE PROPIONATE 50 MCG/ACT NA SUSP: 2.0000 | Freq: Every day | NASAL | 6 refills | Status: DC

## 2019-07-04 ENCOUNTER — Other Ambulatory Visit: Payer: Self-pay | Admitting: Nurse Practitioner

## 2019-07-17 ENCOUNTER — Other Ambulatory Visit: Payer: Self-pay | Admitting: Family

## 2019-08-06 ENCOUNTER — Encounter: Payer: Self-pay | Admitting: Family

## 2019-08-09 ENCOUNTER — Other Ambulatory Visit: Payer: Self-pay | Admitting: Family

## 2019-08-11 ENCOUNTER — Other Ambulatory Visit: Payer: Self-pay | Admitting: Family

## 2019-08-18 ENCOUNTER — Other Ambulatory Visit: Payer: Self-pay

## 2019-08-20 ENCOUNTER — Other Ambulatory Visit: Payer: Self-pay

## 2019-08-20 ENCOUNTER — Ambulatory Visit: Payer: 59 | Admitting: Internal Medicine

## 2019-08-20 ENCOUNTER — Encounter: Payer: Self-pay | Admitting: Internal Medicine

## 2019-08-20 VITALS — BP 128/70 | HR 84 | Ht 65.0 in | Wt 294.0 lb

## 2019-08-20 DIAGNOSIS — E282 Polycystic ovarian syndrome: Secondary | ICD-10-CM

## 2019-08-20 DIAGNOSIS — E1165 Type 2 diabetes mellitus with hyperglycemia: Secondary | ICD-10-CM

## 2019-08-20 DIAGNOSIS — E785 Hyperlipidemia, unspecified: Secondary | ICD-10-CM | POA: Diagnosis not present

## 2019-08-20 LAB — POCT GLYCOSYLATED HEMOGLOBIN (HGB A1C): Hemoglobin A1C: 6.9 % — AB (ref 4.0–5.6)

## 2019-08-20 MED ORDER — NOVOLOG FLEXPEN 100 UNIT/ML ~~LOC~~ SOPN: PEN_INJECTOR | SUBCUTANEOUS | 3 refills | Status: DC

## 2019-08-20 MED ORDER — TRULICITY 3 MG/0.5ML ~~LOC~~ SOAJ: 3.0000 mg | SUBCUTANEOUS | 3 refills | Status: DC

## 2019-08-20 MED ORDER — LEVEMIR FLEXTOUCH 100 UNIT/ML ~~LOC~~ SOPN: PEN_INJECTOR | SUBCUTANEOUS | 2 refills | Status: DC

## 2019-08-20 NOTE — Progress Notes (Signed)
Patient ID: Audrey Goodman, female   DOB: 28-Dec-1970, 49 y.o.   MRN: [id number]   This visit occurred during the SARS-CoV-2 public health emergency.  Safety protocols were in place, including screening questions prior to the visit, additional usage of staff PPE, and extensive cleaning of exam room while observing appropriate contact time as indicated for disinfecting solutions.   HPI: Audrey Goodman is a 49 y.o.-year-old female, returning for f/u for DM2, dx with GDM in 1995, then DM 6 mo after son was born, insulin-dependent since ~2010, uncontrolled, without complications and PCOS. Last visit 4 months ago.  She decided against GBP Sx. She now sees [facility] management [facility]. In last 2 mo >> she already lost 20 lbs!  Reviewed HbA1c levels: Lab Results  Component Value Date   HGBA1C 6.5 (A) 04/21/2019   HGBA1C 7.8 (A) 12/16/2018   HGBA1C 9.2 (H) 11/06/2018   Pt is on a regimen of: - Metformin ER 750 mg 2x a day - Farxiga 5 >> 10 mg in am, before b'fast - Trulicity 1.5 mg weekly - Levemir 40 >> 30 units at bedtime - may forget... - Novolog 15-25 >> 10-20 units depending on the size of the meal - may skip...  Of note, she could not tolerate Jardiance 10 (started 01/2018) because of increased urination.  Pt checks her sugars 2-3 times a day: - am:  80-130, 144, 206 >> 97-164 (snack at night) >> 94-128, 176 - 2h after b'fast: 98-169, 204, 253 >> 110-192 >> 103-148 - before lunch: 110, 135-180, 212, 221 >> 116-147, 197 >> 126-176, 184 (snack) - 2h after lunch:  122-172, 211 >> 114-173  >> n/c - before dinner: 125-163, 180 >> 100-151, 170 >> n/c >> 104-190 - 2h after dinner: n/c  - bedtime: 150-170 >> n/c >> 109-158, 251 (Chinese) >> 127-204 - nighttime: n/c Lowest sugar was  69 (ate a small dinner) ... >> 97 >> 94; she has hypoglycemia awareness in the 80s Highest sugar was 501 (streroid) >> 310 ... >> 204  + CKD-sees nephrology (Dr. Hollie Goodman), last BUN/creatinine:  06/16/2019: 14/0.83,  glucose 117 Lab Results  Component Value Date   BUN 16 11/06/2018   CREATININE 0.90 11/06/2018  08/02/2017: BUN/creatinine 13/0.87, GFR 93, potassium 5.3 (3.5-5.2) She is off lisinopril. She has a history of proteinuria: Component     Latest Ref Rng & Units 03/28/2017  Creatinine, Urine     20 - 275 mg/dL 189  Protein/Creat Ratio     21 - 161 mg/g creat 180 (H)  Total Protein, Urine     5 - 24 mg/dL 34 (H)  Albumin     % 63  Alpha-1-Globulin, U     % 6  ALPHA-2-GLOBULIN, U     % 8  Beta Globulin, U     % 12  Gamma Globulin, U     % 11  Interpretation        Prot,24hr calculated     0 - 149 mg/24 h 480 (H)   + HL; last set of lipids: 06/16/2019: 143/98/44/82 Lab Results  Component Value Date   CHOL 155 11/06/2018   HDL 55.00 11/06/2018   LDLCALC 70 11/06/2018   TRIG 148.0 11/06/2018   CHOLHDL 3 11/06/2018  She has leg cramps with Crestor 10.  - last eye exam was: 09/2018: No DR; Dr. Katy Goodman.  -No numbness and tingling in her feet.  She is now seen by bariatrics and planning gastric bypass surgery.  Latest TSH: 06/16/2019: 3.279  PCOS: -She had irregular menses since menarche -+1 pregnancies, no miscarriages - was on Mirena IUD - for ~4.5 years >> now on Sprintec since 2016 without side effects -She continues Metformin ER 750 mg twice a day and spironolactone 50 mg twice a day -this helps with her significant hirsutism  Latest potassium level was reviewed and this was normal: 06/16/2019: Potassium 4.6 (3.5-5.3) Lab Results  Component Value Date   K 4.7 11/06/2018   ROS: Constitutional: + weight gain/no weight loss, no fatigue, no subjective hyperthermia, no subjective hypothermia Eyes: no blurry vision, no xerophthalmia ENT: no sore throat, no nodules palpated in neck, no dysphagia, no odynophagia, no hoarseness Cardiovascular: no CP/no SOB/no palpitations/no leg swelling Respiratory: no cough/no SOB/no wheezing Gastrointestinal: no N/no V/no D/no C/no acid  reflux Musculoskeletal: no muscle aches/no joint aches Skin: no rashes, no hair loss Neurological: no tremors/no numbness/no tingling/no dizziness  I reviewed pt's medications, allergies, PMH, social hx, family hx, and changes were documented in the history of present illness. Otherwise, unchanged from my initial visit note.  Past Medical History:  Diagnosis Date  . Arthritis    left shoulder  . Depression   . Diabetes mellitus without complication ([place])    type 2  . Diverticulitis   . High cholesterol   . History of chicken pox   . Hyperlipidemia   . Hypertension   . Shingles   . Sleep apnea    uses CPAP nightly  . SVD (spontaneous vaginal delivery) 1995   x 1  . Type 2 diabetes mellitus, uncontrolled ([place]) 07/30/2006   Qualifier: Diagnosis of  By: Audrey Flirt  MD, Audrey Goodman     Past Surgical History:  Procedure Laterality Date  . [place]   right  . CHOLECYSTECTOMY  2008  . HYSTEROSCOPY N/A 09/13/2014   Procedure: HYSTEROSCOPY with IUD removal;  Surgeon: Audrey Hedges, DO;  Location: [facility];  Service: Gynecology;  Laterality: N/A;  need longest speculum and instruments that aresavailable due to patient's habitus (BMI ~60)   Social History   Socioeconomic History  . Marital status: Single    Spouse name: Not on file  . Number of children: Not on file  . Years of education: Not on file  . Highest education level: Not on file  Occupational History  . Not on file  Tobacco Use  . Smoking status: Never Smoker  . Smokeless tobacco: Never Used  Substance and Sexual Activity  . Alcohol use: No    Alcohol/week: 0.0 standard drinks  . Drug use: No  . Sexual activity: Yes    Birth control/protection: Pill  Other Topics Concern  . Not on file  Social History Narrative   Single- lives with boyfriend.   Has a boyfriend   Son 39- senior at [employer]   Enjoys reading   Works as a Marketing executive at [employer]:   . Difficulty of Paying Living Expenses:   Food Insecurity:   . Worried About Charity fundraiser in the Last Year:   . Arboriculturist in the Last Year:   Transportation Needs:   . Film/video editor (Medical):   Marland Kitchen Lack of Transportation (Non-Medical):   Physical Activity:   . Days of Exercise per Week:   . Minutes of Exercise per Session:   Stress:   . Feeling of Stress :   Social Connections:   .  Frequency of Communication with Friends and Family:   . Frequency of Social Gatherings with Friends and Family:   . Attends Religious Services:   . Active Member of Clubs or Organizations:   . Attends Archivist Meetings:   Marland Kitchen Marital Status:   Intimate Partner Violence:   . Fear of Current or Ex-Partner:   . Emotionally Abused:   Marland Kitchen Physically Abused:   . Sexually Abused:    Current Outpatient Medications on File Prior to Visit  Medication Sig Dispense Refill  . amLODipine (NORVASC) 5 MG tablet TAKE 1 TABLET BY MOUTH EVERY DAY 90 tablet 1  . Blood Glucose Monitoring Suppl (ACCU-CHEK GUIDE) w/Device KIT 1 Device by Does not apply route as directed. 1 kit 1  . dapagliflozin propanediol (FARXIGA) 10 MG TABS tablet Take 10 mg by mouth daily before breakfast. 90 tablet 3  . Dulaglutide (TRULICITY) 1.5 VF/6.4PP SOPN INJECT CONTENTS OF 1 PEN (1.5 MG) UNDER THE SKIN ONCE WEEKLY 12 pen 1  . FLUoxetine (PROZAC) 10 MG tablet TAKE 1 TABLET BY MOUTH EVERY DAY 30 tablet 0  . fluticasone (FLONASE) 50 MCG/ACT nasal spray Place 2 sprays into both nostrils daily. 16 g 6  . glucose blood (ACCU-CHEK GUIDE) test strip USE TO TEST BLOOD SUGAR 3 TIMES DAILY 300 each 11  . insulin aspart (NOVOLOG FLEXPEN) 100 UNIT/ML FlexPen Inject 15-25 units depending on size of meal 60 mL 4  . Insulin Detemir (LEVEMIR FLEXTOUCH) 100 UNIT/ML Pen Inject 40 units at bedtime. 45 mL 2  . Insulin Pen Needle 32G X 4 MM MISC Use to inject insulin 4 times daily 400 each 3  . metFORMIN  (GLUCOPHAGE-XR) 750 MG 24 hr tablet Take 1 tablet (750 mg total) by mouth 2 (two) times daily. 180 tablet 3  . rosuvastatin (CRESTOR) 10 MG tablet TAKE 1 TABLET BY MOUTH EVERY DAY 90 tablet 1  . spironolactone (ALDACTONE) 50 MG tablet Take 1 tablet (50 mg total) by mouth 2 (two) times daily. 180 tablet 3   No current facility-administered medications on file prior to visit.   Allergies  Allergen Reactions  . Lisinopril Swelling    Swelling of the left side of tongue  . Lipitor [Atorvastatin] Other (See Comments)    Myalgia/foot cramping   Family History  Problem Relation Age of Onset  . Cancer Mother        breast  . Hyperlipidemia Mother   . Heart disease Mother   . Hypertension Mother   . Mental illness Mother        depression  . Diabetes Mother   . Hyperlipidemia Father   . Stroke Father   . Hypertension Father   . Cancer Maternal Grandmother        colon  . Diabetes Maternal Grandmother   . Diabetes Maternal Uncle   . Diabetes Maternal Aunt   . Diabetes Maternal Uncle     PE: BP 128/70   Pulse 84   Ht '5\' 5"'$  (1.651 m)   Wt 294 lb (133.4 kg)   SpO2 96%   BMI 48.92 kg/m  Wt Readings from Last 3 Encounters:  08/20/19 294 lb (133.4 kg)  04/21/19 (!) 314 lb (142.4 kg)  02/06/19 (!) 315 lb (142.9 kg)   Constitutional: overweight, in NAD Eyes: PERRLA, EOMI, no exophthalmos ENT: moist mucous membranes, no thyromegaly, no cervical lymphadenopathy Cardiovascular: RRR, No MRG Respiratory: CTA B Gastrointestinal: abdomen soft, NT, ND, BS+ Musculoskeletal: no deformities, strength intact in all 4 Skin: moist,  warm, no rashes Neurological: no tremor with outstretched hands, DTR normal in all 4  ASSESSMENT: 1. DM2, insulin-dependent, uncontrolled, without complications  2. PCOS Component     Latest Ref Rng 10/16/2013  Testosterone     10 - 70 ng/dL 54  Sex Hormone Binding     18 - 114 nmol/L 12 (L)  Testosterone Free     0.6 - 6.8 pg/mL 15.5 (H)   Testosterone-% Free     0.4 - 2.4 % 2.9 (H)  TSH     0.35 - 4.50 uIU/mL 0.54  17-OH-Progesterone, LC/MS/MS      <8  Free T4     0.60 - 1.60 ng/dL 0.84  T3, Free     2.3 - 4.2 pg/mL 3.0   Labs confirmed PCOS (high testosterone, low 17-HO Prog, normal TFTs).  LH and FSH were not tested as she was on the Mirena IUD.  3. HL  PLAN:  1. DM2 - Patient with longstanding, uncontrolled, type 2 diabetes, on a complex medication regimen containing basal-bolus insulin regimen, oral medications (Metformin ER and SGLT 2 inhibitor), and weekly GLP-1 receptor agonist.  Of note, she could not tolerate Jardiance in the past due to increased urination but her nephrologist recommended a retrial of an SGLT2 inhibitor to delay her CKD progression.  She is tolerating [place] well. -At last visit, sugars were mostly controlled and she felt that increasing Farxiga to 10 mg daily helped.  They were occasionally higher in the morning due to snacking at night and I strongly advised him to stop.  We did not change her regimen at that time -Since last visit, she did great, losing 20 pounds and her sugars improved to the point of lows.  She then decrease the doses of Levemir and NovoLog.  She occasionally forgets doses but not frequently.  When she does, sugars are higher.  Sugars are at goal in the morning, and they may be higher before lunch and dinner, however, she does have snacks between these meals so these are not completely fasting.  At this point, we decided to increase the Trulicity dose and try to decrease her insulin doses further to also help with weight loss.  If she continues to do so well, it is possible that she would come off at least rapid acting insulin in the near future. -  I suggested to:  Patient Instructions  Please continue: - Metformin ER 750 mg 2x a day - Farxiga 10 mg in am, before b'fast  Please increase: - Trulicity 3 mg weekly  Please decrease: - Levemir to 25 units at bedtime -  Novolog 8-15 units depending on the size of the meal  Please return in 4 months with your sugar log.   - we checked her HbA1c: 6.9% (slightly higher0 - advised to check sugars at different times of the day - 3-4x a day, rotating check times - advised for yearly eye exams >> she is UTD - return to clinic in 4 months    2. PCOS -Patient with clinically evident PCOS, confirmed biochemically 2015 -She is now approaching menopause -She continues on Ortho-Cyclen OCP, tolerating them well -Also continues on Metformin ER and spironolactone.  Her potassium was slightly high in 2019 but at that time she was on a potassium supplement, which we stopped.  Potassium normalized afterwards.  She is on SGLT2 inhibitor which can raise potassium, but in 05/2019, potassium level was normal.  We will continue to keep an eye on this. -She  lost a significant amount of weight recently, and I strongly advised her to continue.  3. HL -Reviewed latest lipid panel from 10/2018: All fractions at goal: Lab Results  Component Value Date   CHOL 155 11/06/2018   HDL 55.00 11/06/2018   LDLCALC 70 11/06/2018   TRIG 148.0 11/06/2018   CHOLHDL 3 11/06/2018  -She continues on a statin but has muscle cramps.  She did not try co-Q10, I suggested  Philemon Kingdom, MD PhD [facility]

## 2019-08-20 NOTE — Addendum Note (Signed)
Addended by: Darliss Ridgel I on: 08/20/2019 10:09 AM   Modules accepted: Orders

## 2019-08-20 NOTE — Patient Instructions (Signed)
Please continue: - Metformin ER 750 mg 2x a day - Farxiga 10 mg in am, before b'fast  Please increase: - Trulicity 3 mg weekly  Please decrease: - Levemir to 25 units at bedtime - Novolog 8-15 units depending on the size of the meal  Please return in 4 months with your sugar log.

## 2019-09-05 ENCOUNTER — Other Ambulatory Visit: Payer: Self-pay | Admitting: Family

## 2019-09-14 ENCOUNTER — Other Ambulatory Visit: Payer: Self-pay | Admitting: Internal Medicine

## 2019-09-21 ENCOUNTER — Other Ambulatory Visit: Payer: Self-pay | Admitting: Family

## 2019-10-08 LAB — HM DIABETES EYE EXAM

## 2019-11-23 LAB — HM MAMMOGRAPHY

## 2020-01-14 ENCOUNTER — Other Ambulatory Visit: Payer: Self-pay

## 2020-01-14 ENCOUNTER — Encounter: Payer: Self-pay | Admitting: Internal Medicine

## 2020-01-14 ENCOUNTER — Ambulatory Visit: Payer: 59 | Admitting: Internal Medicine

## 2020-01-14 VITALS — BP 140/70 | HR 88 | Ht 65.0 in | Wt 290.0 lb

## 2020-01-14 DIAGNOSIS — E1165 Type 2 diabetes mellitus with hyperglycemia: Secondary | ICD-10-CM | POA: Diagnosis not present

## 2020-01-14 DIAGNOSIS — E282 Polycystic ovarian syndrome: Secondary | ICD-10-CM | POA: Diagnosis not present

## 2020-01-14 DIAGNOSIS — E785 Hyperlipidemia, unspecified: Secondary | ICD-10-CM

## 2020-01-14 LAB — POCT GLYCOSYLATED HEMOGLOBIN (HGB A1C): Hemoglobin A1C: 7.2 % — AB (ref 4.0–5.6)

## 2020-01-14 MED ORDER — TRULICITY 4.5 MG/0.5ML ~~LOC~~ SOAJ
4.5000 mg | SUBCUTANEOUS | 3 refills | Status: DC
Start: 2020-01-14 — End: 2021-01-16

## 2020-01-14 NOTE — Progress Notes (Signed)
Patient ID: Audrey Goodman, female   DOB: 10-05-1970, 49 y.o.   MRN: [id number]   This visit occurred during the SARS-CoV-2 public health emergency.  Safety protocols were in place, including screening questions prior to the visit, additional usage of staff PPE, and extensive cleaning of exam room while observing appropriate contact time as indicated for disinfecting solutions.   HPI: Audrey Goodman is a 49 y.o.-year-old female, returning for f/u for DM2, dx with GDM in 1995, then DM 6 mo after son was born, insulin-dependent since ~2010, uncontrolled, without complications and PCOS. Last visit 5 months ago.  She stopped seeing the weight management clinic at [facility] - nonsurgical weight loss program.  She lost 20 pounds before last visit. She plans to restart either the pgm or their instructions ($$$).  She just got her flu shot vaccine.  She completed her COVID-19 vaccination series in 08/2019.  She believes she had COVID-19 in 04/2018.  She was very sick at that time.  Also has  Reviewed HbA1c levels: Lab Results  Component Value Date   HGBA1C 6.9 (A) 08/20/2019   HGBA1C 6.5 (A) 04/21/2019   HGBA1C 7.8 (A) 12/16/2018   Pt is on a regimen of: - Metformin ER 750 mg 2x a day - Farxiga 5 >> 10 mg in am, before b'fast - Trulicity 1.5 >> 3 mg weekly - Levemir 40 >> 30 units at bedtime - may forget... >> 25 units at bedtime - Novolog 15-25 >> 10-20 units depending on the size of the meal - may skip... >> 8 to 15 units before meals  Of note, she could not tolerate Jardiance 10 (started 01/2018) because of increased urination.  Pt checks her sugars 2-3 times a day: - am:  80-130, 144, 206 >> 97-164 >> 94-128, 176 >> 130-160 - 2h after b'fast: 110-192 >> 103-148 >> n/c >> 120-125 - before lunch: 116-147, 197 >> 126-176, 184 (snack) >> 120 - 2h after lunch:  122-172, 211 >> 114-173  >> n/c >> 150-180 - before dinner: 100-151, 170 >> n/c >> 104-190 >> 150-160 - 2h after dinner: n/c >>  180 - bedtime: n/c >> 109-158, 251 (Chinese) >> 127-204 >> 160-200 (snack) - nighttime: n/c Lowest sugar was  69 (ate a small dinner) ... >> 97 >> 94 >> 108; she has hypoglycemia awareness in the 80s Highest sugar was 501 (streroid) >> 310 ... >> 204 >> 200  + CKD-sees nephrology (Audrey Goodman), last BUN/creatinine:  06/16/2019: 14/0.83, glucose 117 Lab Results  Component Value Date   BUN 16 11/06/2018   CREATININE 0.90 11/06/2018  08/02/2017: BUN/creatinine 13/0.87, GFR 93, potassium 5.3 (3.5-5.2) She is off lisinopril. She has a history of proteinuria: Component     Latest Ref Rng & Units 03/28/2017  Creatinine, Urine     20 - 275 mg/dL 189  Protein/Creat Ratio     21 - 161 mg/g creat 180 (H)  Total Protein, Urine     5 - 24 mg/dL 34 (H)  Albumin     % 63  Alpha-1-Globulin, U     % 6  ALPHA-2-GLOBULIN, U     % 8  Beta Globulin, U     % 12  Gamma Globulin, U     % 11  Interpretation        Prot,24hr calculated     0 - 149 mg/24 h 480 (H)   + HL; last set of lipids: 06/16/2019: 143/98/44/82 Lab Results  Component Value Date  CHOL 155 11/06/2018   HDL 55.00 11/06/2018   LDLCALC 70 11/06/2018   TRIG 148.0 11/06/2018   CHOLHDL 3 11/06/2018  She continues to have some leg cramps with Crestor 10.  - last eye exam was: 09/2019: No DR; Audrey Goodman.  -She denies numbness and tingling in her feet.  Latest TSH: 06/16/2019: 3.279  PCOS: -She had irregular menses since menarche -+1 pregnancies, no miscarriages - was on Mirena IUD - for ~4.5 years >> now on Sprintec since 2016 without side effects -She continues on Metformin ER 750 mg twice a day and spironolactone 50 mg twice a day -this helps with her significant hirsutism  Potassium level was normal: 06/16/2019: Potassium 4.6 (3.5-5.3) Lab Results  Component Value Date   K 4.7 11/06/2018   ROS: Constitutional: no weight gain/no weight loss, no fatigue, no subjective hyperthermia, no subjective hypothermia Eyes: no  blurry vision, no xerophthalmia ENT: no sore throat, no nodules palpated in neck, no dysphagia, no odynophagia, no hoarseness Cardiovascular: no CP/no SOB/no palpitations/no leg swelling Respiratory: no cough/no SOB/no wheezing Gastrointestinal: no N/no V/no D/no C/no acid reflux Musculoskeletal: no muscle aches/no joint aches Skin: no rashes, no hair loss Neurological: no tremors/no numbness/no tingling/no dizziness  I reviewed pt's medications, allergies, PMH, social hx, family hx, and changes were documented in the history of present illness. Otherwise, unchanged from my initial visit note.  Past Medical History:  Diagnosis Date  . Arthritis    left shoulder  . Depression   . Diabetes mellitus without complication ([place])    type 2  . Diverticulitis   . High cholesterol   . History of chicken pox   . Hyperlipidemia   . Hypertension   . Shingles   . Sleep apnea    uses CPAP nightly  . SVD (spontaneous vaginal delivery) 1995   x 1  . Type 2 diabetes mellitus, uncontrolled ([place]) 07/30/2006   Qualifier: Diagnosis of  By: Audrey Flirt  MD, Merrily Goodman     Past Surgical History:  Procedure Laterality Date  . [place]   right  . CHOLECYSTECTOMY  2008  . HYSTEROSCOPY N/A 09/13/2014   Procedure: HYSTEROSCOPY with IUD removal;  Surgeon: Audrey Goodman;  Location: [facility];  Service: Gynecology;  Laterality: N/A;  need longest speculum and instruments that aresavailable due to patient's habitus (BMI ~60)   Social History   Socioeconomic History  . Marital status: Single    Spouse name: Not on file  . Number of children: Not on file  . Years of education: Not on file  . Highest education level: Not on file  Occupational History  . Not on file  Tobacco Use  . Smoking status: Never Smoker  . Smokeless tobacco: Never Used  Substance and Sexual Activity  . Alcohol use: No    Alcohol/week: 0.0 standard drinks  . Drug use: No  . Sexual activity: Yes    Birth  control/protection: Pill  Other Topics Concern  . Not on file  Social History Narrative   Single- lives with boyfriend.   Has a boyfriend   Son 71- senior at [employer]   Enjoys reading   Works as a Marketing executive at [employer]:   . Difficulty of Paying Living Expenses: Not on file  Food Insecurity:   . Worried About Charity fundraiser in the Last Year: Not on file  . Ran Out of Food in the  Last Year: Not on file  Transportation Needs:   . Lack of Transportation (Medical): Not on file  . Lack of Transportation (Non-Medical): Not on file  Physical Activity:   . Days of Exercise per Week: Not on file  . Minutes of Exercise per Session: Not on file  Stress:   . Feeling of Stress : Not on file  Social Connections:   . Frequency of Communication with Friends and Family: Not on file  . Frequency of Social Gatherings with Friends and Family: Not on file  . Attends Religious Services: Not on file  . Active Member of Clubs or Organizations: Not on file  . Attends Archivist Meetings: Not on file  . Marital Status: Not on file  Intimate Partner Violence:   . Fear of Current or Ex-Partner: Not on file  . Emotionally Abused: Not on file  . Physically Abused: Not on file  . Sexually Abused: Not on file   Current Outpatient Medications on File Prior to Visit  Medication Sig Dispense Refill  . amLODipine (NORVASC) 5 MG tablet TAKE 1 TABLET BY MOUTH EVERY DAY 90 tablet 1  . Blood Glucose Monitoring Suppl (ACCU-CHEK GUIDE) w/Device KIT 1 Device by Does not apply route as directed. 1 kit 1  . dapagliflozin propanediol (FARXIGA) 10 MG TABS tablet Take 10 mg by mouth daily before breakfast. 90 tablet 3  . Dulaglutide (TRULICITY) 3 NI/6.2VO SOPN Inject 3 mg into the skin once a week. 12 pen 3  . FLUoxetine (PROZAC) 10 MG tablet TAKE 1 TABLET BY MOUTH EVERY DAY.  90 tablet 1  . fluticasone (FLONASE) 50 MCG/ACT nasal spray  Place 2 sprays into both nostrils daily. 16 g 6  . glucose blood (ACCU-CHEK GUIDE) test strip USE TO TEST BLOOD SUGAR 3 TIMES DAILY 300 each 11  . insulin aspart (NOVOLOG FLEXPEN) 100 UNIT/ML FlexPen Inject 8-15 units 3x a day before meals 30 mL 3  . insulin detemir (LEVEMIR FLEXTOUCH) 100 UNIT/ML FlexPen Inject under skin 25 units at bedtime. 30 mL 2  . Insulin Pen Needle 32G X 4 MM MISC Use to inject insulin 4 times daily 400 each 3  . metFORMIN (GLUCOPHAGE-XR) 750 MG 24 hr tablet TAKE 1 TABLET BY MOUTH TWICE A DAY 180 tablet 3  . rosuvastatin (CRESTOR) 10 MG tablet TAKE 1 TABLET BY MOUTH EVERY DAY 90 tablet 1  . spironolactone (ALDACTONE) 50 MG tablet Take 1 tablet (50 mg total) by mouth 2 (two) times daily. 180 tablet 3   No current facility-administered medications on file prior to visit.   Allergies  Allergen Reactions  . Lisinopril Swelling    Swelling of the left side of tongue  . Lipitor [Atorvastatin] Other (See Comments)    Myalgia/foot cramping   Family History  Problem Relation Age of Onset  . Cancer Mother        breast  . Hyperlipidemia Mother   . Heart disease Mother   . Hypertension Mother   . Mental illness Mother        depression  . Diabetes Mother   . Hyperlipidemia Father   . Stroke Father   . Hypertension Father   . Cancer Maternal Grandmother        colon  . Diabetes Maternal Grandmother   . Diabetes Maternal Uncle   . Diabetes Maternal Aunt   . Diabetes Maternal Uncle     PE: BP 140/70   Pulse 88   Ht 5' 5"  (1.651 m)  Wt 290 lb (131.5 kg)   SpO2 98%   BMI 48.26 kg/m  Wt Readings from Last 3 Encounters:  01/14/20 290 lb (131.5 kg)  08/20/19 294 lb (133.4 kg)  04/21/19 (!) 314 lb (142.4 kg)   Constitutional: overweight, in NAD Eyes: PERRLA, EOMI, no exophthalmos ENT: moist mucous membranes, no thyromegaly, no cervical lymphadenopathy Cardiovascular: RRR, No MRG Respiratory: CTA B Gastrointestinal: abdomen soft, NT, ND,  BS+ Musculoskeletal: no deformities, strength intact in all 4 Skin: moist, warm, no rashes Neurological: no tremor with outstretched hands, DTR normal in all 4  ASSESSMENT: 1. DM2, insulin-dependent, uncontrolled, without complications  2. PCOS Component     Latest Ref Rng 10/16/2013  Testosterone     10 - 70 ng/dL 54  Sex Hormone Binding     18 - 114 nmol/L 12 (L)  Testosterone Free     0.6 - 6.8 pg/mL 15.5 (H)  Testosterone-% Free     0.4 - 2.4 % 2.9 (H)  TSH     0.35 - 4.50 uIU/mL 0.54  17-OH-Progesterone, LC/MS/MS      <8  Free T4     0.60 - 1.60 ng/dL 0.84  T3, Free     2.3 - 4.2 pg/mL 3.0   Labs confirmed PCOS (high testosterone, low 17-HO Prog, normal TFTs).  LH and FSH were not tested as she was on the Mirena IUD.  3. HL  PLAN:  1. DM2 - Patient with longstanding, uncontrolled, type 2 diabetes, on a complex medication regimen including basal/bolus insulin regimen, Metformin, SGLT2 inhibitor, weekly GLP-1 receptor agonist, which we increased at last visit.  Of note, she could not tolerate Jardiance in the past due to increased urination but her nephrologist recommended a retrial of an SGLT2 inhibitor to delay her CKD progression.  We started [place], which she tolerates well. -Before last visit, she lost 20 pounds and her sugars improved to the point of lows.  We decreased the dose of Levemir and NovoLog as sugars were at goal in the morning but occasionally higher before lunch and dinner.  Therefore, I advised her to increase Trulicity to 3 mg weekly. She tolerates this well. -At this visit, she mentions that she relaxed her diet and she is now out of the [facility] weight loss program.  She may return to see them if she can afford them.  However, for now, she does plan to start again improving her diet.  Sugars are higher than before especially in the morning but also before dinner and at bedtime.  She continues the same lower doses of Levemir as advised at last visit.   For now, since she is tolerating well the Trulicity 3 mg dose, we can increase the dose to 4.5 mg weekly and continue on the same doses of insulin. -  I suggested to:  Patient Instructions  Please continue: - Metformin ER 750 mg 2x a day - Farxiga 10 mg in am, before b'fast - Levemir 25 units at bedtime - Novolog 8-15 units depending on the size of the meal  Please increase:  - Trulicity 4.5 mg weekly  Please return in 4 months with your sugar log.   - we checked her HbA1c: 7.2% (higher) - advised to check sugars at different times of the day - 3x a day, rotating check times - advised for yearly eye exams >> she is UTD - return to clinic in 4 months   2. PCOS -Patient with clinically evident PCOS, confirmed biochemically in 2015 -  Now approaching menopause -She continues on Ortho-Cyclen OCPs, tolerating them well -She also continues on Metformin ER and spironolactone.  Her potassium was slightly high in 2019 but at that time she was on a potassium supplement, which we stopped.  Potassium normalized afterwards.  She is on SGLT2 inhibitor which can raise potassium, but in 05/2019, potassium level was normal.  We will continue to keep an eye on this. -She decided against weight loss surgery, however, before last visit, she lost a significant amount of weight working with weight for his weight management. Since last OV she lost a net 4 lbs.  3. HL -Reviewed latest lipid panel from earlier this year: LDL 82, which is above target of less than 70 due to her CKD -At last visits she complained of muscle cramps with a statin.  I suggested co-Q10, but she did not start this.  Philemon Kingdom, MD PhD [facility] Endocrinology

## 2020-01-14 NOTE — Patient Instructions (Addendum)
Please continue: - Metformin ER 750 mg 2x a day - Farxiga 10 mg in am, before b'fast - Levemir 25 units at bedtime - Novolog 8-15 units depending on the size of the meal  Please increase:  - Trulicity 4.5 mg weekly  Please return in 4 months with your sugar log.

## 2020-01-14 NOTE — Addendum Note (Signed)
Addended by: Darliss Ridgel I on: 01/14/2020 12:41 PM   Modules accepted: Orders

## 2020-01-30 ENCOUNTER — Other Ambulatory Visit: Payer: Self-pay | Admitting: Internal Medicine

## 2020-02-14 ENCOUNTER — Other Ambulatory Visit: Payer: Self-pay | Admitting: Family

## 2020-02-18 ENCOUNTER — Other Ambulatory Visit: Payer: Self-pay | Admitting: Internal Medicine

## 2020-03-12 ENCOUNTER — Other Ambulatory Visit: Payer: Self-pay | Admitting: Family

## 2020-03-19 ENCOUNTER — Other Ambulatory Visit: Payer: Self-pay | Admitting: Family

## 2020-03-19 ENCOUNTER — Encounter: Payer: Self-pay | Admitting: Internal Medicine

## 2020-03-21 ENCOUNTER — Ambulatory Visit: Payer: 59 | Admitting: Family

## 2020-03-21 ENCOUNTER — Other Ambulatory Visit: Payer: Self-pay

## 2020-03-21 ENCOUNTER — Ambulatory Visit (HOSPITAL_BASED_OUTPATIENT_CLINIC_OR_DEPARTMENT_OTHER)
Admission: RE | Admit: 2020-03-21 | Discharge: 2020-03-21 | Disposition: A | Discharge status: Home / Self Care | Payer: 59 | Source: Ambulatory Visit | Attending: Family | Admitting: Family

## 2020-03-21 ENCOUNTER — Telehealth: Payer: Self-pay | Admitting: Family

## 2020-03-21 VITALS — BP 118/68 | HR 86

## 2020-03-21 DIAGNOSIS — F32A Depression, unspecified: Secondary | ICD-10-CM | POA: Diagnosis not present

## 2020-03-21 DIAGNOSIS — E1165 Type 2 diabetes mellitus with hyperglycemia: Secondary | ICD-10-CM

## 2020-03-21 DIAGNOSIS — M79671 Pain in right foot: Secondary | ICD-10-CM | POA: Insufficient documentation

## 2020-03-21 DIAGNOSIS — E1169 Type 2 diabetes mellitus with other specified complication: Secondary | ICD-10-CM | POA: Diagnosis not present

## 2020-03-21 DIAGNOSIS — E785 Hyperlipidemia, unspecified: Secondary | ICD-10-CM

## 2020-03-21 MED ORDER — ROSUVASTATIN CALCIUM 10 MG PO TABS: 10.0000 mg | ORAL_TABLET | Freq: Every day | ORAL | 1 refills | Status: DC

## 2020-03-21 MED ORDER — FLUOXETINE HCL 10 MG PO TABS: ORAL_TABLET | ORAL | 1 refills | Status: DC

## 2020-03-21 MED ORDER — LEVEMIR FLEXTOUCH 100 UNIT/ML ~~LOC~~ SOPN
PEN_INJECTOR | SUBCUTANEOUS | 2 refills | Status: DC
Start: 2020-03-21 — End: 2020-09-05

## 2020-03-21 NOTE — Patient Instructions (Signed)
Please complete lab work prior to leaving. Complete x-ray on the first floor.  

## 2020-03-21 NOTE — Progress Notes (Signed)
Subjective:    Patient ID: Audrey Goodman, female    DOB: June 01, 1970, 49 y.o.   MRN: [id number]  HPI  Patient is a 49 yr old female who presents today for follow up.  Depression- maintained on fluoxetine.  She reports that her mood is stable.    Hyperlipidemia- maintained on rosuvastatin. Lab Results  Component Value Date   CHOL 155 11/06/2018   HDL 55.00 11/06/2018   LDLCALC 70 11/06/2018   TRIG 148.0 11/06/2018   CHOLHDL 3 11/06/2018    Foot pain- reports that she stumbled one night in the dark and caught her  her right small toe about 2 weeks ago. Since that time she has been and has been having pain. Hurts to walk. She has been using prn tylenol.  DM2- She is working with endocrinology.   Lab Results  Component Value Date   HGBA1C 7.2 (A) 01/14/2020   HGBA1C 6.9 (A) 08/20/2019   HGBA1C 6.5 (A) 04/21/2019   Lab Results  Component Value Date   MICROALBUR 7.0 (H) 11/06/2018   LDLCALC 70 11/06/2018   CREATININE 0.90 11/06/2018      Review of Systems See HPI  Past Medical History:  Diagnosis Date  . Arthritis    left shoulder  . Depression   . Diabetes mellitus without complication ([facility])    type 2  . Diverticulitis   . High cholesterol   . History of chicken pox   . Hyperlipidemia   . Hypertension   . Shingles   . Sleep apnea    uses CPAP nightly  . SVD (spontaneous vaginal delivery) 1995   x 1  . Type 2 diabetes mellitus, uncontrolled ([facility]) 07/30/2006   Qualifier: Diagnosis of  By: Lanier Prude  MD, Cathrine Muster       Social History   Socioeconomic History  . Marital status: Single    Spouse name: Not on file  . Number of children: Not on file  . Years of education: Not on file  . Highest education level: Not on file  Occupational History  . Not on file  Tobacco Use  . Smoking status: Never Smoker  . Smokeless tobacco: Never Used  Substance and Sexual Activity  . Alcohol use: No    Alcohol/week: 0.0 standard drinks  . Drug use: No  . Sexual  activity: Yes    Birth control/protection: Pill  Other Topics Concern  . Not on file  Social History Narrative   Single- lives with boyfriend.   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 Difficulty of Paying Living Expenses: Not on file  Food Insecurity:   . Worried About Programme researcher, broadcasting/film/video in the Last Year: Not on file  . Ran Out of Food in the Last Year: Not on file  Transportation Needs:   . Lack of Transportation (Medical): Not on file  . Lack of Transportation (Non-Medical): Not on file  Physical Activity:   . Days of Exercise per Week: Not on file  . Minutes of Exercise per Session: Not on file  Stress:   . Feeling of Stress : Not on file  Social Connections:   . Frequency of Communication with Friends and Family: Not on file  . Frequency of Social Gatherings with Friends and Family: Not on file  . Attends Religious Services: Not  on file  . Active Member of Clubs or Organizations: Not on file  . Attends Banker Meetings: Not on file  . Marital Status: Not on file  Intimate Partner Violence:   . Fear of Current or Ex-Partner: Not on file  . Emotionally Abused: Not on file  . Physically Abused: Not on file  . Sexually Abused: Not on file    Past Surgical History:  Procedure Laterality Date  . ANKLE FRACTURE SURGERY  1991   right  . CHOLECYSTECTOMY  2008  . HYSTEROSCOPY N/A 09/13/2014   Procedure: HYSTEROSCOPY with IUD removal;  Surgeon: Mitchel Honour, DO;  Location: [facility];  Service: Gynecology;  Laterality: N/A;  need longest speculum and instruments that aresavailable due to patient's habitus (BMI ~60)    Family History  Problem Relation Age of Onset  . Cancer Mother        breast  . Hyperlipidemia Mother   . Heart disease Mother   . Hypertension Mother   . Mental illness Mother        depression  . Diabetes Mother    . Hyperlipidemia Father   . Stroke Father   . Hypertension Father   . Cancer Maternal Grandmother        colon  . Diabetes Maternal Grandmother   . Diabetes Maternal Uncle   . Diabetes Maternal Aunt   . Diabetes Maternal Uncle     Allergies  Allergen Reactions  . Lisinopril Swelling    Swelling of the left side of tongue  . Lipitor [Atorvastatin] Other (See Comments)    Myalgia/foot cramping      BP 118/68 (BP Location: Left Arm, Patient Position: Sitting, Cuff Size: Large)   Pulse 86   SpO2 98%       Objective:   Physical Exam Constitutional:      Appearance: She is well-developed.  Cardiovascular:     Rate and Rhythm: Normal rate and regular rhythm.     Heart sounds: Normal heart sounds. No murmur heard.   Pulmonary:     Effort: Pulmonary effort is normal. No respiratory distress.     Breath sounds: Normal breath sounds. No wheezing.  Musculoskeletal:     Comments: + tenderness to palpation at the base of the right small toe.    Psychiatric:        Behavior: Behavior normal.        Thought Content: Thought content normal.        Judgment: Judgment normal.           Assessment & Plan:  Hyperlipidemia- tolerating rosuvastatin 10mg . Obtain lipid panel. CMET.  DM2- fair control- management per endocrinology.   Depression- stable on prozac 10mg . Continue same.  R foot pain- obtain x-ray to exclude fracture.  Pneumovax 23 booster today, request mammo report from GYN.  This visit occurred during the SARS-CoV-2 public health emergency.  Safety protocols were in place, including screening questions prior to the visit, additional usage of staff PPE, and extensive cleaning of exam room while observing appropriate contact time as indicated for disinfecting solutions.

## 2020-03-21 NOTE — Telephone Encounter (Signed)
Mammogram requested

## 2020-03-21 NOTE — Telephone Encounter (Signed)
Please call [facility] and request copy of mammogram.  

## 2020-03-22 ENCOUNTER — Encounter: Payer: Self-pay | Admitting: Family

## 2020-03-22 LAB — COMPREHENSIVE METABOLIC PANEL
AG Ratio: 1.4 (calc) (ref 1.0–2.5)
ALT: 9 U/L (ref 6–29)
AST: 11 U/L (ref 10–35)
Albumin: 4.1 g/dL (ref 3.6–5.1)
Alkaline phosphatase (APISO): 80 U/L (ref 31–125)
BUN: 13 mg/dL (ref 7–25)
CO2: 24 mmol/L (ref 20–32)
Calcium: 9.7 mg/dL (ref 8.6–10.2)
Chloride: 102 mmol/L (ref 98–110)
Creat: 0.86 mg/dL (ref 0.50–1.10)
Globulin: 2.9 g/dL (calc) (ref 1.9–3.7)
Glucose, Bld: 116 mg/dL — ABNORMAL HIGH (ref 65–99)
Potassium: 5.1 mmol/L (ref 3.5–5.3)
Sodium: 137 mmol/L (ref 135–146)
Total Bilirubin: 0.5 mg/dL (ref 0.2–1.2)
Total Protein: 7 g/dL (ref 6.1–8.1)

## 2020-03-22 LAB — LIPID PANEL
Cholesterol: 156 mg/dL (ref <200)
HDL: 54 mg/dL (ref >=50)
LDL Cholesterol (Calc): 82 mg/dL (calc)
Non-HDL Cholesterol (Calc): 102 mg/dL (calc) (ref <130)
Total CHOL/HDL Ratio: 2.9 (calc) (ref <5.0)
Triglycerides: 107 mg/dL (ref <150)

## 2020-03-22 LAB — MICROALBUMIN / CREATININE URINE RATIO
Creatinine, Urine: 103 mg/dL (ref 20–275)
Microalb Creat Ratio: 11 mcg/mg creat (ref <30)
Microalb, Ur: 1.1 mg/dL

## 2020-04-06 ENCOUNTER — Other Ambulatory Visit: Payer: Self-pay | Admitting: Family

## 2020-05-17 ENCOUNTER — Ambulatory Visit: Payer: 59 | Admitting: Internal Medicine

## 2020-05-24 ENCOUNTER — Other Ambulatory Visit: Payer: Self-pay | Admitting: Internal Medicine

## 2020-06-08 ENCOUNTER — Encounter: Payer: Self-pay | Admitting: Internal Medicine

## 2020-06-09 ENCOUNTER — Other Ambulatory Visit: Payer: Self-pay

## 2020-06-09 ENCOUNTER — Ambulatory Visit: Payer: 59 | Admitting: Internal Medicine

## 2020-06-09 ENCOUNTER — Encounter: Payer: Self-pay | Admitting: Internal Medicine

## 2020-06-09 VITALS — BP 130/88 | HR 80 | Ht 65.0 in | Wt 287.2 lb

## 2020-06-09 DIAGNOSIS — E1165 Type 2 diabetes mellitus with hyperglycemia: Secondary | ICD-10-CM | POA: Diagnosis not present

## 2020-06-09 DIAGNOSIS — E282 Polycystic ovarian syndrome: Secondary | ICD-10-CM

## 2020-06-09 DIAGNOSIS — E785 Hyperlipidemia, unspecified: Secondary | ICD-10-CM

## 2020-06-09 LAB — POCT GLYCOSYLATED HEMOGLOBIN (HGB A1C): Hemoglobin A1C: 6.8 % — AB (ref 4.0–5.6)

## 2020-06-09 NOTE — Progress Notes (Signed)
Patient ID: Audrey Goodman, female   DOB: June 30, 1970, 50 y.o.   MRN: [id number]   This visit occurred during the SARS-CoV-2 public health emergency.  Safety protocols were in place, including screening questions prior to the visit, additional usage of staff PPE, and extensive cleaning of exam room while observing appropriate contact time as indicated for disinfecting solutions.   HPI: Audrey Goodman is a 50 y.o.-year-old female, returning for f/u for DM2, dx with GDM in 1995, then DM 6 mo after son was born, insulin-dependent since ~2010, uncontrolled, without complications and PCOS. Last visit 4.5 months ago.  She was previously seen with weight management clinic at [facility] - nonsurgical weight loss program.  She lost 20 pounds on their diet but could not be done due to price.  At last visit, she was planning to either restart the program or their diet.  She did not restart since last visit, as she was more depressed.  She is planning to get back to see her counselor, whom she did not see since 2 years ago.  She mentions that she did not miss medication doses since last visit.  Reviewed HbA1c levels: Lab Results  Component Value Date   HGBA1C 7.2 (A) 01/14/2020   HGBA1C 6.9 (A) 08/20/2019   HGBA1C 6.5 (A) 04/21/2019   Pt is on a regimen of: - Metformin ER 750 mg 2x a day - Farxiga 5 >> 10 mg in am, before b'fast - Trulicity 1.5 >> 3 >> 4.5 mg weekly - Levemir 40 >> 30 units at bedtime - may forget... >> 25 units at bedtime -misses this once a week - Novolog 15-25 >> 10-20 units depending on the size of the meal - may skip... >> 8 to 15 before meals  Of note, she could not tolerate Jardiance 10 (started 01/2018) because of increased urination.  Pt checks her sugars 2-3 times a day: - am:  97-164 >> 94-128, 176 >> 130-160 >> 97, 117-144, 150, 302 (missed meds) - 2h after b'fast: 110-192 >> 103-148 >> n/c >> 120-125 >> 151 - before lunch: 116-147, 197 >> 126-176, 184 (snack) >> 120 >>  91-136, 165, 187 - 2h after lunch:  122-172, 211 >> 114-173  >> n/c >> 150-180 >> n/c - before dinner: 100-151, 170 >> n/c >> 104-190 >> 150-160 >> 101-122, 171, 182 - 2h after dinner: n/c >> 180 >> 165-194 - bedtime: n/c >> 109-158, 251 (Chinese) >> 127-204 >> 160-200 (snack) >> n/c - nighttime: n/c Lowest sugar was  69 (ate a small dinner) ... >> 108 >> 91; she has hypoglycemia awareness in the 80s. Highest sugar was 501 (streroid) >> 310 ...  >> 200 >> 302  + CKD-sees nephrology (Dr. Hollie Salk), last BUN/creatinine:  Lab Results  Component Value Date   BUN 13 03/21/2020   CREATININE 0.86 03/21/2020  06/16/2019: 14/0.83, glucose 117 08/02/2017: BUN/creatinine 13/0.87, GFR 93, potassium 5.3 (3.5-5.2) She is off lisinopril. She has a history of proteinuria: Component     Latest Ref Rng & Units 03/28/2017  Creatinine, Urine     20 - 275 mg/dL 189  Protein/Creat Ratio     21 - 161 mg/g creat 180 (H)  Total Protein, Urine     5 - 24 mg/dL 34 (H)  Albumin     % 63  Alpha-1-Globulin, U     % 6  ALPHA-2-GLOBULIN, U     % 8  Beta Globulin, U     % 12  Gamma  Globulin, U     % 11  Interpretation        Prot,24hr calculated     0 - 149 mg/24 h 480 (H)   + HL; last set of lipids: Lab Results  Component Value Date   CHOL 156 03/21/2020   HDL 54 03/21/2020   LDLCALC 82 03/21/2020   TRIG 107 03/21/2020   CHOLHDL 2.9 03/21/2020  06/16/2019: 143/98/44/82 She continues to have some leg cramps with Crestor 10. Started drinking more water >> helped.  - last eye exam was: 09/2019: No DR; Dr. Katy Fitch.  - no numbness and tingling in her feet.  Latest TSH: 06/16/2019: 3.279  PCOS: -She had irregular menses since menarche -+1 pregnancies, no miscarriages - was on Mirena IUD - for ~4.5 years >> now on Sprintec since 2016 without side effects -She continues on metformin ER 750 mg twice a day and spironolactone 50 mg twice a day -this helps with her hirsutism  Latest potassium level is  normal Lab Results  Component Value Date   K 5.1 03/21/2020  06/16/2019: Potassium 4.6 (3.5-5.3)  ROS: Constitutional: no weight gain/no weight loss, no fatigue, no subjective hyperthermia, no subjective hypothermia Eyes: no blurry vision, no xerophthalmia ENT: no sore throat, no nodules palpated in neck, no dysphagia, no odynophagia, no hoarseness Cardiovascular: no CP/no SOB/no palpitations/no leg swelling Respiratory: no cough/no SOB/no wheezing Gastrointestinal: no N/no V/no D/no C/no acid reflux Musculoskeletal: no muscle aches/no joint aches Skin: no rashes, no hair loss Neurological: no tremors/no numbness/no tingling/no dizziness  I reviewed pt's medications, allergies, PMH, social hx, family hx, and changes were documented in the history of present illness. Otherwise, unchanged from my initial visit note.  Past Medical History:  Diagnosis Date  . Arthritis    left shoulder  . Depression   . Diabetes mellitus without complication ([place])    type 2  . Diverticulitis   . High cholesterol   . History of chicken pox   . Hyperlipidemia   . Hypertension   . Shingles   . Sleep apnea    uses CPAP nightly  . SVD (spontaneous vaginal delivery) 1995   x 1  . Type 2 diabetes mellitus, uncontrolled ([place]) 07/30/2006   Qualifier: Diagnosis of  By: Drue Flirt  MD, Merrily Brittle     Past Surgical History:  Procedure Laterality Date  . [place]   right  . CHOLECYSTECTOMY  2008  . HYSTEROSCOPY N/A 09/13/2014   Procedure: HYSTEROSCOPY with IUD removal;  Surgeon: Linda Hedges, DO;  Location: [facility];  Service: Gynecology;  Laterality: N/A;  need longest speculum and instruments that aresavailable due to patient's habitus (BMI ~60)   Social History   Socioeconomic History  . Marital status: Single    Spouse name: Not on file  . Number of children: Not on file  . Years of education: Not on file  . Highest education level: Not on file  Occupational History  . Not on file   Tobacco Use  . Smoking status: Never Smoker  . Smokeless tobacco: Never Used  Substance and Sexual Activity  . Alcohol use: No    Alcohol/week: 0.0 standard drinks  . Drug use: No  . Sexual activity: Yes    Birth control/protection: Pill  Other Topics Concern  . Not on file  Social History Narrative   Single- lives with boyfriend.   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 amLODipine (NORVASC) 5 MG tablet Take 1 tablet (5 mg total) by mouth daily. 90 tablet 1  . Blood Glucose Monitoring Suppl (ACCU-CHEK GUIDE) w/Device KIT 1 Device by Does not apply route as directed. 1 kit 1  . Dulaglutide (TRULICITY) 4.5 GM/0.1UU SOPN Inject 0.5 mLs (4.5 mg total) as directed once a week. 8 mL 3  . FARXIGA 10 MG TABS tablet TAKE 1/2 TO 1 TABLET BY MOUTH DAILY BEFORE BREAKFAST AS DIRECTED 90 tablet 3  . FLUoxetine (PROZAC) 10 MG tablet TAKE 1 TABLET BY MOUTH EVERY DAY. 90 tablet 1  . fluticasone (FLONASE) 50 MCG/ACT nasal spray Place 2 sprays into both nostrils daily. 16 g 6  . glucose blood (ACCU-CHEK GUIDE) test strip USE TO TEST BLOOD SUGAR 3 TIMES DAILY 300 each 11  . insulin aspart (NOVOLOG FLEXPEN) 100 UNIT/ML FlexPen Inject 8-15 units 3x a day before meals 30 mL 3  . insulin detemir (LEVEMIR FLEXTOUCH) 100 UNIT/ML FlexPen INJECT 40 UNITS UNDER THE SKIN AT BEDTIME 36 mL 2  . Insulin Pen Needle 32G X 4 MM MISC Use to inject insulin 4 times daily 400 each 3  . metFORMIN (GLUCOPHAGE-XR) 750 MG 24 hr tablet TAKE 1 TABLET BY MOUTH TWICE A DAY 180 tablet 3  . rosuvastatin (CRESTOR) 10 MG tablet Take 1  tablet (10 mg total) by mouth daily. 90 tablet 1  . spironolactone (ALDACTONE) 50 MG tablet TAKE 1 TABLET BY MOUTH TWICE A DAY 180 tablet 3   No current facility-administered medications for this visit.    Allergies  Allergen Reactions  . Lisinopril Swelling    Swelling of the left side of tongue  . Lipitor [Atorvastatin] Other (See Comments)    Myalgia/foot cramping   Family History  Problem Relation Age of Onset  . Cancer Mother        breast  . Hyperlipidemia Mother   . Heart disease Mother   . Hypertension Mother   . Mental illness Mother        depression  . Diabetes Mother   . Hyperlipidemia Father   . Stroke Father   . Hypertension Father   . Cancer Maternal Grandmother        colon  . Diabetes Maternal Grandmother   . Diabetes Maternal Uncle   . Diabetes Maternal Aunt   . Diabetes Maternal Uncle     PE: BP 130/88   Pulse 80   Ht $R'5\' 5"'dp$  (1.651 m)   Wt 287 lb 3.2 oz (130.3 kg)   SpO2 97%   BMI 47.79 kg/m  Wt Readings from Last 3 Encounters:  06/09/20 287 lb 3.2 oz (130.3 kg)  01/14/20 290 lb (131.5 kg)  08/20/19 294 lb (133.4 kg)   Constitutional: overweight, in NAD Eyes: PERRLA, EOMI, no exophthalmos ENT: moist mucous membranes, no thyromegaly, no cervical lymphadenopathy Cardiovascular: RRR, No MRG Respiratory: CTA B Gastrointestinal: abdomen soft, NT, ND, BS+ Musculoskeletal: no deformities, strength intact in all 4 Skin: moist, warm, no rashes Neurological: no tremor with outstretched hands, DTR normal in all 4  ASSESSMENT: 1. DM2, insulin-dependent, uncontrolled, without complications  2.  PCOS Component     Latest Ref Rng 10/16/2013  Testosterone     10 - 70 ng/dL 54  Sex Hormone Binding     18 - 114 nmol/L 12 (L)  Testosterone Free     0.6 - 6.8 pg/mL 15.5 (H)  Testosterone-% Free     0.4 - 2.4 % 2.9 (H)  TSH     0.35 - 4.50 uIU/mL 0.54  17-OH-Progesterone, LC/MS/MS      <8  Free T4     0.60 - 1.60 ng/dL 0.84  T3, Free     2.3 -  4.2 pg/mL 3.0   Labs confirmed PCOS (high testosterone, low 17-HO Prog, normal TFTs).  LH and FSH were not tested as she was on the Mirena IUD.  3. HL  PLAN:  1. DM2 - Patient with longstanding, uncontrolled, type 2 diabetes, on a complex medication regimen including basal/bolus insulin regimen, metformin, SGLT2 inhibitor, and weekly GLP-1 receptor agonist, increased at last visit of note, she could not tolerate Jardiance in the past due to increased urination but her nephrologist recommended a retrial of an SGLT2 inhibitor to delay CKD progression.  We started [place], which she tolerates well. -At last visit, she relaxed her diet and she was out of the [facility] weight loss program.  Her HbA1c was higher, at 7.2%.  At that time, I suggested to increase Trulicity from 3 mg to 4.5 mg weekly.  She did so and she tolerates it well.  However, she mentions that since last visit, she was more lax with her diet and also skipped medication doses.  Reviewing her blood sugars for [place] meter, her highest blood sugar in the last month was at 302, after she missed medications.  Otherwise, sugars are mostly at goal with some occasional hyperglycemic spikes and without lows.  I strongly encouraged her to continue the same regimen and take it consistently, but no changes are needed in her regimen otherwise -  I suggested to:  Patient Instructions  Please continue: - Metformin ER 750 mg 2x a day - Farxiga 10 mg in am, before b'fast - Levemir 25 units at bedtime - Novolog 8-15 units depending on the size of the meal  - Trulicity 4.5 mg weekly  Please return in 4 months with your sugar log.   - we checked her HbA1c: 6.8% (improved) - advised to check sugars at different times of the day - 3x a day, rotating check times - advised for yearly eye exams >> she is UTD - return to clinic in 4 months  2. PCOS -Patient with clinically evident PCOS, confirmed biochemically in 2015 -Now approaching  menopause -She continues on Ortho-Cyclen OCPs, tolerating them well -He is also on Trulicity, helping with weight loss (she lost 3 pounds since last visit), and also metformin ER and spironolactone.  Her potassium was slightly high in 2019 but at that time she was on a potassium supplement, which we stopped.  Potassium normalized afterwards.  She is on SGLT2 inhibitor, which can raise potassium, but latest level was normal.  We will continue to keep an eye on this. Lab Results  Component Value Date   K 5.1 03/21/2020  -She decided against weight loss surgery but started to lose weight  3. HL -Review latest lipid panel from 02/2020: LDL 82, above our target of less than 70 [place] ICD Lab Results  Component Value Date   CHOL 156 03/21/2020   HDL 54 03/21/2020   LDLCALC 82 03/21/2020  TRIG 107 03/21/2020   CHOLHDL 2.9 03/21/2020  -At last visit, she complained of muscle aches (leg cramps) from Crestor 10.  I suggested to add co-Q10, but she did not do so.  She increase her water intake and her cramps improved.  Philemon Kingdom, MD PhD [facility] Endocrinology

## 2020-06-09 NOTE — Patient Instructions (Signed)
Please continue: - Metformin ER 750 mg 2x a day - Farxiga 10 mg in am, before b'fast - Levemir 25 units at bedtime - Novolog 8-15 units depending on the size of the meal  - Trulicity 4.5 mg weekly  Please return in 4 months with your sugar log.

## 2020-07-01 ENCOUNTER — Encounter: Payer: Self-pay | Admitting: Internal Medicine

## 2020-07-04 ENCOUNTER — Other Ambulatory Visit: Payer: Self-pay | Admitting: *Deleted

## 2020-07-04 MED ORDER — INSULIN PEN NEEDLE 32G X 4 MM MISC
3 refills | Status: DC
Start: 2020-07-04 — End: 2022-08-06

## 2020-09-01 ENCOUNTER — Encounter: Payer: Self-pay | Admitting: Internal Medicine

## 2020-09-01 DIAGNOSIS — E1165 Type 2 diabetes mellitus with hyperglycemia: Secondary | ICD-10-CM

## 2020-09-05 MED ORDER — LEVEMIR FLEXTOUCH 100 UNIT/ML ~~LOC~~ SOPN: 25.0000 [IU] | PEN_INJECTOR | Freq: Every day | SUBCUTANEOUS | 1 refills | Status: DC

## 2020-09-08 ENCOUNTER — Other Ambulatory Visit: Payer: Self-pay | Admitting: Family

## 2020-09-14 ENCOUNTER — Encounter: Payer: Self-pay | Admitting: Family

## 2020-09-14 ENCOUNTER — Ambulatory Visit: Payer: 59 | Admitting: Family

## 2020-09-14 ENCOUNTER — Other Ambulatory Visit: Payer: Self-pay

## 2020-09-14 DIAGNOSIS — I10 Essential (primary) hypertension: Secondary | ICD-10-CM

## 2020-09-14 DIAGNOSIS — F32A Depression, unspecified: Secondary | ICD-10-CM

## 2020-09-14 DIAGNOSIS — E1165 Type 2 diabetes mellitus with hyperglycemia: Secondary | ICD-10-CM | POA: Diagnosis not present

## 2020-09-14 DIAGNOSIS — Z0000 Encounter for general adult medical examination without abnormal findings: Secondary | ICD-10-CM

## 2020-09-14 NOTE — Assessment & Plan Note (Signed)
Stable on amlodipine 5mg . Continue same.

## 2020-09-14 NOTE — Assessment & Plan Note (Signed)
Advised against use of phentermine.  Will refer to medical weight loss clinic. Discussed healthy diet, exercise and weight loss.

## 2020-09-14 NOTE — Assessment & Plan Note (Signed)
A1C at goal. Management per endo.

## 2020-09-14 NOTE — Progress Notes (Signed)
Subjective:   By signing my name below, I, Shehryar Baig, attest that this documentation has been prepared under the direction and in the presence of [name]. 09/14/2020    Patient ID: Audrey Goodman, female    DOB: 1970/10/26, 49 y.o.   MRN: [id number]  No chief complaint on file.   HPI Patient is in today for a office visit. She is interested in taking phentermine to help manage her weight loss. She currently participates in light exercise by walking around the house and yoga. She also mentions she drinks protein shakes for breakfast then eats a snack before lunch. For lunch she eats anything that is available at that time. She is interested in making a appointment with a weight loss clinic.  Filed Weights   09/14/20 0705  Weight: 287 lb (130.2 kg)    Depression- She reports her mood is better on 10 mg prozac daily PO.    Diabetes- She is working with endocrinology.  Lab Results  Component Value Date   HGBA1C 6.8 (A) 06/09/2020     Past Medical History:  Diagnosis Date  . Arthritis    left shoulder  . Depression   . Diabetes mellitus without complication ([facility])    type 2  . Diverticulitis   . High cholesterol   . History of chicken pox   . Hyperlipidemia   . Hypertension   . Shingles   . Sleep apnea    uses CPAP nightly  . SVD (spontaneous vaginal delivery) 1995   x 1  . Type 2 diabetes mellitus, uncontrolled ([facility]) 07/30/2006   Qualifier: Diagnosis of  By: Lanier Prude  MD, Cathrine Muster      Past Surgical History:  Procedure Laterality Date  . ANKLE FRACTURE SURGERY  1991   right  . CHOLECYSTECTOMY  2008  . HYSTEROSCOPY N/A 09/13/2014   Procedure: HYSTEROSCOPY with IUD removal;  Surgeon: Mitchel Honour, DO;  Location: [facility];  Service: Gynecology;  Laterality: N/A;  need longest speculum and instruments that aresavailable due to patient's habitus (BMI ~60)    Family History  Problem Relation Age of Onset  . Cancer Mother        breast  . Hyperlipidemia  Mother   . Heart disease Mother   . Hypertension Mother   . Mental illness Mother        depression  . Diabetes Mother   . Hyperlipidemia Father   . Stroke Father   . Hypertension Father   . Cancer Maternal Grandmother        colon  . Diabetes Maternal Grandmother   . Diabetes Maternal Uncle   . Diabetes Maternal Aunt   . Diabetes Maternal Uncle     Social History   Socioeconomic History  . Marital status: Single    Spouse name: Not on file  . Number of children: Not on file  . Years of education: Not on file  . Highest education level: Not on file  Occupational History  . Not on file  Tobacco Use  . Smoking status: Never Smoker  . Smokeless tobacco: Never Used  Substance and Sexual Activity  . Alcohol use: No    Alcohol/week: 0.0 standard drinks  . Drug use: No  . Sexual activity: Yes    Birth control/protection: Pill  Other Topics Concern  . Not on file  Social History Narrative   Single- lives with boyfriend.   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 amLODipine (NORVASC) 5 MG tablet Take 1 tablet (5 mg total) by mouth daily. 90 tablet 1  . Dulaglutide (TRULICITY) 4.5 MG/0.5ML SOPN Inject 0.5 mLs (4.5 mg total) as directed once a week. 8 mL 3  . FARXIGA 10 MG TABS tablet TAKE 1/2 TO 1 TABLET BY MOUTH DAILY BEFORE BREAKFAST AS DIRECTED 90 tablet 3  . FLUoxetine (PROZAC) 10 MG tablet TAKE 1 TABLET BY MOUTH EVERY DAY 90 tablet 0  . insulin aspart (NOVOLOG FLEXPEN) 100 UNIT/ML FlexPen Inject 8-15 units 3x a day before meals 30 mL 3  . insulin detemir (LEVEMIR  FLEXTOUCH) 100 UNIT/ML FlexPen Inject 25 Units into the skin at bedtime. 30 mL 1  . Insulin Pen Needle 32G X 4 MM MISC Use to inject insulin 4 times daily 400 each 3  . metFORMIN (GLUCOPHAGE-XR) 750 MG 24 hr tablet TAKE 1 TABLET BY MOUTH TWICE A DAY 180 tablet 3  . rosuvastatin (CRESTOR) 10 MG tablet TAKE 1 TABLET BY MOUTH EVERY DAY 90 tablet 1  . spironolactone (ALDACTONE) 50 MG tablet TAKE 1 TABLET BY MOUTH TWICE A DAY 180 tablet 3   No facility-administered medications prior to visit.    Allergies  Allergen Reactions  . Lisinopril Swelling    Swelling of the left side of tongue  . Lipitor [Atorvastatin] Other (See Comments)    Myalgia/foot cramping    ROS     Objective:    Physical Exam Constitutional:      Appearance: Normal appearance.  HENT:     Head: Normocephalic and atraumatic.  Cardiovascular:     Pulses: Normal pulses.     Heart sounds: Normal heart sounds.  Pulmonary:     Effort: Pulmonary effort is normal.     Breath sounds: Normal breath sounds.  Skin:    General: Skin is warm and dry.  Neurological:     General: No focal deficit present.     Mental Status: She is alert and oriented to person, place, and time.  Psychiatric:        Attention and Perception: Attention normal.        Mood and Affect: Mood normal.        Speech: Speech normal.        Behavior: Behavior normal.        Cognition and Memory: Cognition normal.     There were no vitals taken for this visit. Wt Readings from Last 3 Encounters:  06/09/20 287 lb 3.2 oz (130.3 kg)  01/14/20 290 lb (131.5 kg)  08/20/19 294 lb (133.4 kg)    Diabetic Foot Exam - Simple   No data filed    Lab Results  Component Value Date   WBC 11.0 (H) 03/08/2017   HGB 11.9 03/08/2017   HCT 36.2 03/08/2017   PLT 384 03/08/2017   GLUCOSE 116 (H) 03/21/2020   CHOL 156 03/21/2020   TRIG 107 03/21/2020   HDL 54 03/21/2020   LDLCALC 82 03/21/2020   ALT 9 03/21/2020   AST 11 03/21/2020   NA 137  03/21/2020   K  5.1 03/21/2020   CL 102 03/21/2020   CREATININE 0.86 03/21/2020   BUN 13 03/21/2020   CO2 24 03/21/2020   TSH 2.61 03/08/2017   HGBA1C 6.8 (A) 06/09/2020   MICROALBUR 1.1 03/21/2020    Lab Results  Component Value Date   TSH 2.61 03/08/2017   Lab Results  Component Value Date   WBC 11.0 (H) 03/08/2017   HGB 11.9 03/08/2017   HCT 36.2 03/08/2017   MCV 84.8 03/08/2017   PLT 384 03/08/2017   Lab Results  Component Value Date   NA 137 03/21/2020   K 5.1 03/21/2020   CO2 24 03/21/2020   GLUCOSE 116 (H) 03/21/2020   BUN 13 03/21/2020   CREATININE 0.86 03/21/2020   BILITOT 0.5 03/21/2020   ALKPHOS 66 11/06/2018   AST 11 03/21/2020   ALT 9 03/21/2020   PROT 7.0 03/21/2020   ALBUMIN 3.6 11/06/2018   CALCIUM 9.7 03/21/2020   ANIONGAP 6 08/30/2014   GFR 81.22 11/06/2018   Lab Results  Component Value Date   CHOL 156 03/21/2020   Lab Results  Component Value Date   HDL 54 03/21/2020   Lab Results  Component Value Date   LDLCALC 82 03/21/2020   Lab Results  Component Value Date   TRIG 107 03/21/2020   Lab Results  Component Value Date   CHOLHDL 2.9 03/21/2020   Lab Results  Component Value Date   HGBA1C 6.8 (A) 06/09/2020       Assessment & Plan:   Problem List Items Addressed This Visit   None      No orders of the defined types were placed in this encounter.   I, Shehryar Tyson Alias, personally preformed the services described in this documentation.  All medical record entries made by the scribe were at my direction and in my presence.  I have reviewed the chart and discharge instructions (if applicable) and agree that the record reflects my personal performance and is accurate and complete. 09/14/2020   I,Shehryar Baig,acting as a Neurosurgeon for Lemont Fillers, NP.,have documented all relevant documentation on the behalf of Lemont Fillers, NP,as directed by  Lemont Fillers, NP while in the presence of Lemont Fillers,  NP.  Shehryar Baig   I, Lemont Fillers, NP, have reviewed all documentation for this visit. The documentation on 09/14/20 for the exam, diagnosis, procedures, and orders are all accurate and complete.

## 2020-09-14 NOTE — Assessment & Plan Note (Addendum)
Stable on fluoxetine 10mg . Continue same.

## 2020-09-15 ENCOUNTER — Encounter: Payer: Self-pay | Admitting: Gastroenterology

## 2020-09-19 ENCOUNTER — Ambulatory Visit: Payer: 59 | Admitting: Family

## 2020-10-08 ENCOUNTER — Other Ambulatory Visit: Payer: Self-pay | Admitting: Internal Medicine

## 2020-10-08 DIAGNOSIS — E1165 Type 2 diabetes mellitus with hyperglycemia: Secondary | ICD-10-CM

## 2020-10-12 ENCOUNTER — Other Ambulatory Visit: Payer: Self-pay | Admitting: Family

## 2020-10-12 ENCOUNTER — Encounter (INDEPENDENT_AMBULATORY_CARE_PROVIDER_SITE_OTHER): Payer: Self-pay

## 2020-10-14 ENCOUNTER — Ambulatory Visit: Payer: 59 | Admitting: Internal Medicine

## 2020-10-14 ENCOUNTER — Other Ambulatory Visit: Payer: Self-pay

## 2020-10-14 ENCOUNTER — Encounter: Payer: Self-pay | Admitting: Internal Medicine

## 2020-10-14 VITALS — BP 120/70 | HR 86 | Ht 65.0 in | Wt 289.5 lb

## 2020-10-14 DIAGNOSIS — E282 Polycystic ovarian syndrome: Secondary | ICD-10-CM

## 2020-10-14 DIAGNOSIS — E1165 Type 2 diabetes mellitus with hyperglycemia: Secondary | ICD-10-CM

## 2020-10-14 DIAGNOSIS — E785 Hyperlipidemia, unspecified: Secondary | ICD-10-CM | POA: Diagnosis not present

## 2020-10-14 LAB — POCT GLYCOSYLATED HEMOGLOBIN (HGB A1C): Hemoglobin A1C: 7 % — AB (ref 4.0–5.6)

## 2020-10-14 MED ORDER — NOVOLOG FLEXPEN 100 UNIT/ML ~~LOC~~ SOPN: PEN_INJECTOR | SUBCUTANEOUS | 3 refills | Status: DC

## 2020-10-14 NOTE — Addendum Note (Signed)
Addended by: Tawnya Crook on: 10/14/2020 08:28 AM   Modules accepted: Orders

## 2020-10-14 NOTE — Patient Instructions (Signed)
Please continue: - Metformin ER 750 mg 2x a day - Farxiga 10 mg in am, before b'fast    - Trulicity 4.5 mg weekly    - Novolog 12-15 units depending on the size of the meal  Please increase: - Levemir 30 units at bedtime  Please return in 4 months with your sugar log.

## 2020-10-14 NOTE — Progress Notes (Signed)
Patient ID: Audrey Goodman, female   DOB: 12-08-1970, 50 y.o.   MRN: [id number]   This visit occurred during the SARS-CoV-2 public health emergency.  Safety protocols were in place, including screening questions prior to the visit, additional usage of staff PPE, and extensive cleaning of exam room while observing appropriate contact time as indicated for disinfecting solutions.   HPI: Audrey Goodman is a 50 y.o.-year-old female, returning for f/u for DM2, dx with GDM in 1995, then DM 6 mo after son was born, insulin-dependent since ~2010, uncontrolled, without complications and PCOS. Last visit 4 months ago.  Interim history: She was previously seen with weight management clinic at [facility] - nonsurgical weight loss program.  She lost 20 pounds on their diet but could not be done due to price.  Before last visit, she did not take steps towards losing weight as she was more depressed. At this visit, she just returned from a cruise in the [place]. She had dietary indiscretions and sugars increased. No increased urination, blurry vision, nausea.  Reviewed HbA1c levels: Lab Results  Component Value Date   HGBA1C 6.8 (A) 06/09/2020   HGBA1C 7.2 (A) 01/14/2020   HGBA1C 6.9 (A) 08/20/2019   At last visit she was on: - Metformin ER 750 mg 2x a day - Farxiga 5 >> 10 mg in am, before b'fast - Trulicity 1.5 >> 3 >> 4.5 mg weekly - Levemir 40 >> 30 units at bedtime - may forget... >> 25 units at bedtime -misses this once a week - Novolog 15-25 >> 10-20 units depending on the size of the meal - may skip... >> 8 to 15 before meals  Of note, she could not tolerate Jardiance 10 (started 01/2018) because of increased urination.  Currently on: - Metformin ER 750 mg 2x a day - Farxiga 10 mg in am, before b'fast - Levemir 25 units at bedtime - Novolog 8-15 units depending on the size of the meal - Trulicity 4.5 mg weekly  Pt checks her sugars 2-3 times a day: - am:  130-160 >> 97, 117-144, 150, 302  (missed meds) >> 110-159 (cruise) - 2h after b'fast: 103-148 >> n/c >> 120-125 >> 151 >> n/c - before lunch: 126-176, 184 (snack) >> 120 >> 91-136, 165, 187 >> n/c - 2h after lunch: 114-173  >> n/c >> 150-180 >> n/c >> 150-190 - before dinner: 104-190 >> 150-160 >> 101-122, 171, 182 >> 140-160 - 2h after dinner: n/c >> 180 >> 165-194 >> 90-220 - bedtime: 109-158, 251 >> 127-204 >> 160-200 (snack) >> n/c  - nighttime: n/c Lowest sugar was  69 (ate a small dinner) ... >> 91 >> 90; she has hypoglycemia awareness in the 80s. Highest sugar was 501 (streroid) >> 310 ...  >> 200 >> 302  + CKD-sees nephrology (Dr. Signe Colt), last BUN/creatinine:  Lab Results  Component Value Date   BUN 13 03/21/2020   CREATININE 0.86 03/21/2020  06/16/2019: 14/0.83, glucose 117 08/02/2017: BUN/creatinine 13/0.87, GFR 93, potassium 5.3 (3.5-5.2) She is off lisinopril. She has a history of proteinuria: Component     Latest Ref Rng & Units 03/28/2017  Creatinine, Urine     20 - 275 mg/dL 716  Protein/Creat Ratio     21 - 161 mg/g creat 180 (H)  Total Protein, Urine     5 - 24 mg/dL 34 (H)  Albumin     % 63  Alpha-1-Globulin, U     % 6  ALPHA-2-GLOBULIN, U     %  8  Beta Globulin, U     % 12  Gamma Globulin, U     % 11  Interpretation        Prot,24hr calculated     0 - 149 mg/24 h 480 (H)   + HL; last set of lipids: Lab Results  Component Value Date   CHOL 156 03/21/2020   HDL 54 03/21/2020   LDLCALC 82 03/21/2020   TRIG 107 03/21/2020   CHOLHDL 2.9 03/21/2020  06/16/2019: 143/98/44/82 She had leg cramps with Crestor 10. Started drinking more water >> helped.  - last eye exam was: 09/2019: No DR; Dr. Dione BoozeGroat.  - no numbness and tingling in her feet.  Latest TSH: 06/16/2019: 3.279  PCOS: -She had irregular menses since menarche -+1 pregnancies, no miscarriages - was on Mirena IUD - for ~4.5 years >> then on Sprintec since 2016 -She continues on metformin ER 750 mg twice a day and  spironolactone 50 mg twice a day -this helps with her hirsutism  Latest potassium level is normal: Lab Results  Component Value Date   K 5.1 03/21/2020  06/16/2019: Potassium 4.6 (3.5-5.3)  ROS: Constitutional: no weight gain/no weight loss, no fatigue, no subjective hyperthermia, no subjective hypothermia Eyes: no blurry vision, no xerophthalmia ENT: no sore throat, no nodules palpated in neck, no dysphagia, no odynophagia, no hoarseness Cardiovascular: no CP/no SOB/no palpitations/no leg swelling Respiratory: no cough/no SOB/no wheezing Gastrointestinal: no N/no V/no D/no C/no acid reflux Musculoskeletal: no muscle aches/no joint aches Skin: no rashes, no hair loss Neurological: no tremors/no numbness/no tingling/no dizziness  I reviewed pt's medications, allergies, PMH, social hx, family hx, and changes were documented in the history of present illness. Otherwise, unchanged from my initial visit note.  Past Medical History:  Diagnosis Date  . Arthritis    left shoulder  . Depression   . Diabetes mellitus without complication (HCC)    type 2  . Diverticulitis   . High cholesterol   . History of chicken pox   . Hyperlipidemia   . Hypertension   . Shingles   . Sleep apnea    uses CPAP nightly  . SVD (spontaneous vaginal delivery) 1995   x 1  . Type 2 diabetes mellitus, uncontrolled (HCC) 07/30/2006   Qualifier: Diagnosis of  By: Lanier PrudeBolden  MD, Cathrine Musteraineisha     Past Surgical History:  Procedure Laterality Date  . ANKLE FRACTURE SURGERY  1991   right  . CHOLECYSTECTOMY  2008  . HYSTEROSCOPY N/A 09/13/2014   Procedure: HYSTEROSCOPY with IUD removal;  Surgeon: Mitchel HonourMegan Morris, DO;  Location: [facility];  Service: Gynecology;  Laterality: N/A;  need longest speculum and instruments that aresavailable due to patient's habitus (BMI ~60)   Social History   Socioeconomic History  . Marital status: Single    Spouse name: Not on file  . Number of children: Not on file  . Years of  education: Not on file  . Highest education level: Not on file  Occupational History  . Not on file  Tobacco Use  . Smoking status: Never Smoker  . Smokeless tobacco: Never Used  Substance and Sexual Activity  . Alcohol use: No    Alcohol/week: 0.0 standard drinks  . Drug use: No  . Sexual activity: Yes    Birth control/protection: Pill  Other Topics Concern  . Not on file  Social History Narrative   Single- lives with boyfriend.   Has a boyfriend   Son 5121- senior at [employer]  Enjoys reading   Works as a Advertising account planner at [employer]: Not on [employer] Insecurity: Not on file  Transportation Needs: Not on file  Physical Activity: Not on file  Stress: Not on file  Social Connections: Not on file  Intimate Partner Violence: Not on file   Current Outpatient Medications  Medication Sig Dispense Refill  . amLODipine (NORVASC) 5 MG tablet TAKE 1 TABLET BY MOUTH EVERY DAY 90 tablet 1  . Dulaglutide (TRULICITY) 4.5 MG/0.5ML SOPN Inject 0.5 mLs (4.5 mg total) as directed once a week. 8 mL 3  . FARXIGA 10 MG TABS tablet TAKE 1/2 TO 1 TABLET BY MOUTH DAILY BEFORE BREAKFAST AS DIRECTED 90 tablet 3  . FLUoxetine (PROZAC) 10 MG tablet TAKE 1 TABLET BY MOUTH EVERY DAY 90 tablet 0  . insulin aspart (NOVOLOG FLEXPEN) 100 UNIT/ML FlexPen INJECT 15-25 UNITS (DEPENDING ON MEAL SIZE) UNDER THE SKIN 3 TIMES A DAY 30 mL 2  . insulin detemir (LEVEMIR FLEXTOUCH) 100 UNIT/ML FlexPen Inject 25 Units into the skin at bedtime. 30 mL 1  . Insulin Pen Needle 32G X 4 MM MISC Use to inject insulin 4 times daily 400 each 3  . metFORMIN (GLUCOPHAGE-XR) 750 MG 24 hr tablet TAKE 1 TABLET BY MOUTH TWICE A DAY 180 tablet 3  . rosuvastatin (CRESTOR) 10 MG tablet TAKE 1 TABLET BY MOUTH EVERY DAY 90 tablet 1  . spironolactone (ALDACTONE) 50 MG tablet TAKE 1 TABLET BY MOUTH TWICE A DAY 180 tablet 3   No current facility-administered medications for this  visit.    Allergies  Allergen Reactions  . Lisinopril Swelling    Swelling of the left side of tongue  . Lipitor [Atorvastatin] Other (See Comments)    Myalgia/foot cramping   Family History  Problem Relation Age of Onset  . Cancer Mother        breast  . Hyperlipidemia Mother   . Heart disease Mother   . Hypertension Mother   . Mental illness Mother        depression  . Diabetes Mother   . Hyperlipidemia Father   . Stroke Father   . Hypertension Father   . Cancer Maternal Grandmother        colon  . Diabetes Maternal Grandmother   . Diabetes Maternal Uncle   . Diabetes Maternal Aunt   . Diabetes Maternal Uncle     PE: BP 120/70   Pulse 86   Ht 5\' 5"  (1.651 m)   Wt 289 lb 8 oz (131.3 kg)   SpO2 98%   BMI 48.18 kg/m  Wt Readings from Last 3 Encounters:  10/14/20 289 lb 8 oz (131.3 kg)  09/14/20 287 lb (130.2 kg)  06/09/20 287 lb 3.2 oz (130.3 kg)   Constitutional: overweight, in NAD Eyes: PERRLA, EOMI, no exophthalmos ENT: moist mucous membranes, no thyromegaly, no cervical lymphadenopathy Cardiovascular: RRR, No MRG Respiratory: CTA B Gastrointestinal: abdomen soft, NT, ND, BS+ Musculoskeletal: no deformities, strength intact in all 4 Skin: moist, warm, no rashes Neurological: no tremor with outstretched hands, DTR normal in all 4  ASSESSMENT: 1. DM2, insulin-dependent, uncontrolled, without complications  2. PCOS Component     Latest Ref Rng 10/16/2013  Testosterone     10 - 70 ng/dL 54  Sex Hormone Binding     18 - 114 nmol/L 12 (L)  Testosterone Free     0.6 - 6.8 pg/mL 15.5 (H)  Testosterone-% Free     0.4 - 2.4 % 2.9 (H)  TSH     0.35 - 4.50 uIU/mL 0.54  17-OH-Progesterone, LC/MS/MS      <8  Free T4     0.60 - 1.60 ng/dL 1.44  T3, Free     2.3 - 4.2 pg/mL 3.0   Labs confirmed PCOS (high testosterone, low 17-HO Prog, normal TFTs).  LH and FSH were not tested as she was on the Mirena IUD.  3. HL  PLAN:  1. DM2 - Patient with  longstanding, uncontrolled, type 2 diabetes, on a complex medication regimen including basal-bolus insulin regimen, metformin, SGLT2 inhibitor, and also weekly GLP-1 receptor agonist at maximal dose, with improved control at last visit-HbA1c 6.8%. -At last visit, sugars were mostly at goal with some occasional hyperglycemic spikes but without lows. She did have sugars in the 300s in the past when she missed medications. We discussed about the importance of taking medications consistently, but we did not change her regimen at that time.   -At today's visit, sugars are worse, increased during her cruise at the end of last month and they did not improve to previous levels yet.  They are higher than target in the morning and also before and after dinner.  She is now working on improving her diet, so for now, we will keep her on the higher doses of NovoLog within the recommended range, but we will also try to increase Levemir at least provisionally, until sugars start to improve. -  I suggested to:  Patient Instructions  Please continue: - Metformin ER 750 mg 2x a day - Farxiga 10 mg in am, before b'fast    - Trulicity 4.5 mg weekly    - Novolog 12-15 units depending on the size of the meal  Please increase: - Levemir 30 units at bedtime  Please return in 4 months with your sugar log.    - we checked her HbA1c: 7.0% (slightly higher) - advised to check sugars at different times of the day - 3x a day, rotating check times - advised for yearly eye exams >> she is not UTD - return to clinic in 4 months  2. PCOS -Patient with clinically evident PCOS, confirmed biochemically 2015 -Now perimenopausal -She continues on OCPs, Ortho-Cyclen, tolerated well -She is also on Trulicity, metformin ER, and spironolactone.  Her potassium level was slightly high in 2019 but at that time she was on a potassium supplement, which we stopped.  She is also on an SGLT2 inhibitor which can raise potassium.  Latest level  was normal: Lab Results  Component Value Date   K 5.1 03/21/2020  -She decided against weight loss surgery -She discussed with PCP about starting phentermine, but PCP suggested weight Management Clinic -she is planning to start this.  3. HL -Reviewed latest lipid panel from 02/2020: Fractions at goal: Lab Results  Component Value Date   CHOL 156 03/21/2020   HDL 54 03/21/2020   LDLCALC 82 03/21/2020   TRIG 107 03/21/2020   CHOLHDL 2.9 03/21/2020  -She continues on Crestor 10 mg daily.  Before last visit, she had muscle cramps but she increased her water intake and these resolved.  Carlus Pavlov, MD PhD [facility]

## 2020-10-28 ENCOUNTER — Other Ambulatory Visit: Payer: Self-pay | Admitting: Internal Medicine

## 2020-11-10 LAB — HM DIABETES EYE EXAM

## 2020-11-18 ENCOUNTER — Ambulatory Visit (AMBULATORY_SURGERY_CENTER): Payer: 59

## 2020-11-18 ENCOUNTER — Other Ambulatory Visit: Payer: Self-pay

## 2020-11-18 VITALS — Ht 65.0 in | Wt 288.0 lb

## 2020-11-18 DIAGNOSIS — Z1211 Encounter for screening for malignant neoplasm of colon: Secondary | ICD-10-CM

## 2020-11-18 MED ORDER — PLENVU 140 G PO SOLR: 1.0000 | ORAL | 0 refills | Status: DC

## 2020-11-18 NOTE — Progress Notes (Signed)
No egg or soy allergy known to patient  No issues with past sedation with any surgeries or procedures Patient denies ever being told they had issues or difficulty with intubation  No FH of Malignant Hyperthermia No diet pills per patient No home 02 use per patient  No blood thinners per patient  Pt denies issues with constipation  No A fib or A flutter  EMMI video via MyChart  COVID 19 guidelines implemented in PV today with Pt and RN  Pt is fully vaccinated  for Covid  Coupon given to pt in PV today, Code to Pharmacy and NO PA's for preps discussed with pt In PV today  Discussed with pt there will be an out-of-pocket cost for prep and that varies from $0 to 70 dollars  Due to the COVID-19 pandemic we are asking patients to follow certain guidelines.  Pt aware of COVID protocols and [facility] guidelines   

## 2020-11-29 ENCOUNTER — Encounter (INDEPENDENT_AMBULATORY_CARE_PROVIDER_SITE_OTHER): Payer: Self-pay | Admitting: Family Medicine

## 2020-11-29 ENCOUNTER — Ambulatory Visit (INDEPENDENT_AMBULATORY_CARE_PROVIDER_SITE_OTHER): Payer: 59 | Admitting: Family Medicine

## 2020-11-29 ENCOUNTER — Other Ambulatory Visit: Payer: Self-pay

## 2020-11-29 VITALS — BP 103/70 | HR 84 | Temp 99.2°F | Ht 65.0 in | Wt 283.0 lb

## 2020-11-29 DIAGNOSIS — Z9189 Other specified personal risk factors, not elsewhere classified: Secondary | ICD-10-CM

## 2020-11-29 DIAGNOSIS — F3289 Other specified depressive episodes: Secondary | ICD-10-CM | POA: Diagnosis not present

## 2020-11-29 DIAGNOSIS — Z794 Long term (current) use of insulin: Secondary | ICD-10-CM

## 2020-11-29 DIAGNOSIS — E66813 Obesity, class 3: Secondary | ICD-10-CM

## 2020-11-29 DIAGNOSIS — I152 Hypertension secondary to endocrine disorders: Secondary | ICD-10-CM

## 2020-11-29 DIAGNOSIS — E1159 Type 2 diabetes mellitus with other circulatory complications: Secondary | ICD-10-CM

## 2020-11-29 DIAGNOSIS — R5383 Other fatigue: Secondary | ICD-10-CM

## 2020-11-29 DIAGNOSIS — E1169 Type 2 diabetes mellitus with other specified complication: Secondary | ICD-10-CM | POA: Diagnosis not present

## 2020-11-29 DIAGNOSIS — Z6841 Body Mass Index (BMI) 40.0 and over, adult: Secondary | ICD-10-CM

## 2020-11-29 DIAGNOSIS — Z0289 Encounter for other administrative examinations: Secondary | ICD-10-CM

## 2020-11-29 DIAGNOSIS — E559 Vitamin D deficiency, unspecified: Secondary | ICD-10-CM | POA: Diagnosis not present

## 2020-11-29 DIAGNOSIS — R0602 Shortness of breath: Secondary | ICD-10-CM

## 2020-11-29 DIAGNOSIS — E785 Hyperlipidemia, unspecified: Secondary | ICD-10-CM

## 2020-11-30 LAB — CBC WITH DIFFERENTIAL/PLATELET
Basophils Absolute: 0.1 10*3/uL (ref 0.0–0.2)
Basos: 1 %
EOS (ABSOLUTE): 0.3 10*3/uL (ref 0.0–0.4)
Eos: 4 %
Hematocrit: 46.4 % (ref 34.0–46.6)
Hemoglobin: 14.7 g/dL (ref 11.1–15.9)
Immature Grans (Abs): 0 10*3/uL (ref 0.0–0.1)
Immature Granulocytes: 1 %
Lymphocytes Absolute: 2.7 10*3/uL (ref 0.7–3.1)
Lymphs: 36 %
MCH: 28.4 pg (ref 26.6–33.0)
MCHC: 31.7 g/dL (ref 31.5–35.7)
MCV: 90 fL (ref 79–97)
Monocytes Absolute: 0.6 10*3/uL (ref 0.1–0.9)
Monocytes: 8 %
Neutrophils Absolute: 4 10*3/uL (ref 1.4–7.0)
Neutrophils: 50 %
Platelets: 338 10*3/uL (ref 150–450)
RBC: 5.18 x10E6/uL (ref 3.77–5.28)
RDW: 12.5 % (ref 11.7–15.4)
WBC: 7.6 10*3/uL (ref 3.4–10.8)

## 2020-11-30 LAB — COMPREHENSIVE METABOLIC PANEL
ALT: 10 IU/L (ref 0–32)
AST: 10 IU/L (ref 0–40)
Albumin/Globulin Ratio: 1.5 (ref 1.2–2.2)
Albumin: 4.4 g/dL (ref 3.8–4.8)
Alkaline Phosphatase: 95 IU/L (ref 44–121)
BUN/Creatinine Ratio: 18 (ref 9–23)
BUN: 19 mg/dL (ref 6–24)
Bilirubin Total: 0.5 mg/dL (ref 0.0–1.2)
CO2: 22 mmol/L (ref 20–29)
Calcium: 9.7 mg/dL (ref 8.7–10.2)
Chloride: 99 mmol/L (ref 96–106)
Creatinine, Ser: 1.03 mg/dL — ABNORMAL HIGH (ref 0.57–1.00)
Globulin, Total: 3 g/dL (ref 1.5–4.5)
Glucose: 108 mg/dL — ABNORMAL HIGH (ref 65–99)
Potassium: 4.9 mmol/L (ref 3.5–5.2)
Sodium: 136 mmol/L (ref 134–144)
Total Protein: 7.4 g/dL (ref 6.0–8.5)
eGFR: 67 mL/min/{1.73_m2} (ref >59)

## 2020-11-30 LAB — TSH: TSH: 3.41 u[IU]/mL (ref 0.450–4.500)

## 2020-11-30 LAB — LIPID PANEL WITH LDL/HDL RATIO
Cholesterol, Total: 159 mg/dL (ref 100–199)
HDL: 51 mg/dL (ref >39)
LDL Chol Calc (NIH): 90 mg/dL (ref 0–99)
LDL/HDL Ratio: 1.8 ratio (ref 0.0–3.2)
Triglycerides: 99 mg/dL (ref 0–149)
VLDL Cholesterol Cal: 18 mg/dL (ref 5–40)

## 2020-11-30 LAB — T4, FREE: Free T4: 1.21 ng/dL (ref 0.82–1.77)

## 2020-11-30 LAB — VITAMIN B12: Vitamin B-12: 533 pg/mL (ref 232–1245)

## 2020-11-30 LAB — FOLATE: Folate: 11.2 ng/mL (ref >3.0)

## 2020-11-30 LAB — VITAMIN D 25 HYDROXY (VIT D DEFICIENCY, FRACTURES): Vit D, 25-Hydroxy: 54.4 ng/mL (ref 30.0–100.0)

## 2020-12-01 LAB — HM MAMMOGRAPHY

## 2020-12-01 LAB — HM PAP SMEAR: HM Pap smear: NEGATIVE

## 2020-12-02 ENCOUNTER — Encounter: Payer: Self-pay | Admitting: Gastroenterology

## 2020-12-02 ENCOUNTER — Other Ambulatory Visit: Payer: Self-pay

## 2020-12-02 ENCOUNTER — Ambulatory Visit (AMBULATORY_SURGERY_CENTER): Payer: 59 | Admitting: Gastroenterology

## 2020-12-02 VITALS — BP 126/65 | HR 87 | Temp 97.3°F | Resp 14 | Ht 65.0 in | Wt 288.0 lb

## 2020-12-02 DIAGNOSIS — Z1211 Encounter for screening for malignant neoplasm of colon: Secondary | ICD-10-CM

## 2020-12-02 MED ORDER — SODIUM CHLORIDE 0.9 % IV SOLN: 500.0000 mL | Freq: Once | INTRAVENOUS | Status: DC

## 2020-12-02 NOTE — Patient Instructions (Signed)
Impression/Recommendations:  Resume previous diet. Continue present medications. Repeat colonoscopy in 10 years for screening.  YOU HAD AN ENDOSCOPIC PROCEDURE TODAY AT THE [facility]:   Refer to the procedure report that was given to you for any specific questions about what was found during the examination.  If the procedure report does not answer your questions, please call your gastroenterologist to clarify.  If you requested that your care partner not be given the details of your procedure findings, then the procedure report has been included in a sealed envelope for you to review at your convenience later.  YOU SHOULD EXPECT: Some feelings of bloating in the abdomen. Passage of more gas than usual.  Walking can help get rid of the air that was put into your GI tract during the procedure and reduce the bloating. If you had a lower endoscopy (such as a colonoscopy or flexible sigmoidoscopy) you may notice spotting of blood in your stool or on the toilet paper. If you underwent a bowel prep for your procedure, you may not have a normal bowel movement for a few days.  Please Note:  You might notice some irritation and congestion in your nose or some drainage.  This is from the oxygen used during your procedure.  There is no need for concern and it should clear up in a day or so.  SYMPTOMS TO REPORT IMMEDIATELY:  Following lower endoscopy (colonoscopy or flexible sigmoidoscopy):  Excessive amounts of blood in the stool  Significant tenderness or worsening of abdominal pains  Swelling of the abdomen that is new, acute  Fever of 100F or higher  For urgent or emergent issues, a gastroenterologist can be reached at any hour by calling [phone]. Do not use MyChart messaging for urgent concerns.    DIET:  We do recommend a small meal at first, but then you may proceed to your regular diet.  Drink plenty of fluids but you should avoid alcoholic beverages for 24  hours.  ACTIVITY:  You should plan to take it easy for the rest of today and you should NOT DRIVE or use heavy machinery until tomorrow (because of the sedation medicines used during the test).    FOLLOW UP: Our staff will call the number listed on your records 48-72 hours following your procedure to check on you and address any questions or concerns that you may have regarding the information given to you following your procedure. If we do not reach you, we will leave a message.  We will attempt to reach you two times.  During this call, we will ask if you have developed any symptoms of COVID 19. If you develop any symptoms (ie: fever, flu-like symptoms, shortness of breath, cough etc.) before then, please call [phone].  If you test positive for Covid 19 in the 2 weeks post procedure, please call and report this information to [name].    If any biopsies were taken you will be contacted by phone or by letter within the next 1-3 weeks.  Please call us at [phone] if you have not heard about the biopsies in 3 weeks.    SIGNATURES/CONFIDENTIALITY: You and/or your care partner have signed paperwork which will be entered into your electronic medical record.  These signatures attest to the fact that that the information above on your After Visit Summary has been reviewed and is understood.  Full responsibility of the confidentiality of this discharge information lies with you and/or your care-partner.

## 2020-12-02 NOTE — Op Note (Signed)
[facility] Patient Name: Audrey Goodman Procedure Date: 12/02/2020 8:49 AM MRN: [id number] Endoscopist: Sherilyn Cooter L. Myrtie Neither , MD Age: 50 Referring MD:  Date of Birth: [birth year] Gender: Female Account #: [id number] Procedure:                Colonoscopy Indications:              Screening for colorectal malignant neoplasm, This                            is the patient's first colonoscopy Medicines:                Monitored Anesthesia Care Procedure:                Pre-Anesthesia Assessment:                           - Prior to the procedure, a History and Physical                            was performed, and patient medications and                            allergies were reviewed. The patient's tolerance of                            previous anesthesia was also reviewed. The risks                            and benefits of the procedure and the sedation                            options and risks were discussed with the patient.                            All questions were answered, and informed consent                            was obtained. Prior Anticoagulants: The patient has                            taken no previous anticoagulant or antiplatelet                            agents. ASA Grade Assessment: III - A patient with                            severe systemic disease. After reviewing the risks                            and benefits, the patient was deemed in                            satisfactory condition to undergo the procedure.  After obtaining informed consent, the colonoscope                            was passed under direct vision. Throughout the                            procedure, the patient's blood pressure, pulse, and                            oxygen saturations were monitored continuously. The                            CF [id number] #[id number] was introduced through the anus                            and advanced to the the  cecum, identified by                            appendiceal orifice and ileocecal valve. The                            colonoscopy was performed without difficulty. The                            patient tolerated the procedure well. The quality                            of the bowel preparation was good. The ileocecal                            valve, appendiceal orifice, and rectum were                            photographed. The bowel preparation used was Plenvu. Scope In: 9:35:12 AM Scope Out: 9:49:09 AM Scope Withdrawal Time: 0 hours 10 minutes 53 seconds  Total Procedure Duration: 0 hours 13 minutes 57 seconds  Findings:                 The perianal and digital rectal examinations were                            normal.                           The entire examined colon appeared normal on direct                            and retroflexion views. Complications:            No immediate complications. Estimated Blood Loss:     Estimated blood loss: none. Impression:               - The entire examined colon is normal on direct and  retroflexion views.                           - No specimens collected. Recommendation:           - Patient has a contact number available for                            emergencies. The signs and symptoms of potential                            delayed complications were discussed with the                            patient. Return to normal activities tomorrow.                            Written discharge instructions were provided to the                            patient.                           - Resume previous diet.                           - Continue present medications.                           - Repeat colonoscopy in 10 years for screening                            purposes. Rico Massar L. Myrtie Neither, MD 12/02/2020 9:53:17 AM This report has been signed electronically.

## 2020-12-02 NOTE — Progress Notes (Signed)
Report to PACU, RN, vss, BBS= Clear.  

## 2020-12-05 ENCOUNTER — Encounter (INDEPENDENT_AMBULATORY_CARE_PROVIDER_SITE_OTHER): Payer: Self-pay | Admitting: Family Medicine

## 2020-12-05 NOTE — Telephone Encounter (Signed)
Last OV with Dr Opalski 

## 2020-12-06 ENCOUNTER — Telehealth: Payer: Self-pay | Admitting: *Deleted

## 2020-12-06 NOTE — Telephone Encounter (Signed)
Please advise 

## 2020-12-06 NOTE — Telephone Encounter (Signed)
  Follow up Call-  Call back number 12/02/2020  Post procedure Call Back phone  # [phone]  Permission to leave phone message Yes  Some recent data might be hidden     Patient questions:  Do you have a fever, pain , or abdominal swelling? No. Pain Score  0 *  Have you tolerated food without any problems? Yes.    Have you been able to return to your normal activities? Yes.    Do you have any questions about your discharge instructions: Diet   No. Medications  No. Follow up visit  No.  Do you have questions or concerns about your Care? No.  Actions: * If pain score is 4 or above: No action needed, pain <4.

## 2020-12-08 NOTE — Progress Notes (Signed)
Dear Audrey CrazeMelissa O'Sullivan, NP,   Thank you for referring Audrey Goodman to our clinic. The following note includes my evaluation and treatment recommendations.  Chief Complaint:   OBESITY Audrey Goodman (MR# [id number]) is a 50 y.o. female who presents for evaluation and treatment of obesity and related comorbidities. Current BMI is Body mass index is 47.09 kg/m. Audrey Goodman has been struggling with her weight for many years and has been unsuccessful in either losing weight, maintaining weight loss, or reaching her healthy weight goal.  Audrey Goodman is currently in the action stage of change and ready to dedicate time achieving and maintaining a healthier weight. Audrey Goodman is interested in becoming our patient and working on intensive lifestyle modifications including (but not limited to) diet and exercise for weight loss.  Audrey Goodman is single and does not have a significant other.  She has an 49 year old child.  Desires 90 pound weight loss in 6-12 months.  Eats out 2-3 times per week.  Craves pasta, fried foods, and fast foods.  Drinks calories.  Recently seen by PCP and requested a prescription for phentermine, which PCP declined and recommended she come here for evaluation and treatment plan.  [facility] in March 2021.  Used Lomaira 8 mg in the past twice daily.  Lost 14 pounds (from 312 to 298 pounds, per records) while on medication from 06/25/2019 to 07/2019.  Has colonoscopy scheduled on 12/02/2020.  She will start after that and one she gets rid of "unhealthy foods" in her house as well.  Audrey Goodman habits were reviewed today and are as follows: she struggles with family and or coworkers weight loss sabotage, her desired weight loss is 87 pounds, she has been heavy most of her life, she started gaining weight as a child, her heaviest weight ever was 406 pounds, she craves pasta, fried foods, and beef, she snacks frequently in the evenings, she wakes up in the middle of the night hungry, she is  frequently drinking liquids with calories, she frequently makes poor food choices, she frequently eats larger portions than normal, and she struggles with emotional eating.  Depression Screen Audrey Goodman Food and Mood (modified PHQ-9) score was 5.  Depression screen [facility] 2/9 11/29/2020  Decreased Interest 0  Down, Depressed, Hopeless 0  PHQ - 2 Score 0  Altered sleeping 1  Tired, decreased energy 1  Change in appetite 1  Feeling bad or failure about yourself  0  Trouble concentrating 2  Moving slowly or fidgety/restless 0  Suicidal thoughts 0  PHQ-9 Score 5  Difficult doing work/chores Not difficult at all  Some recent data might be hidden   Assessment/Plan:   Orders Placed This Encounter  Procedures   Vitamin B12   CBC with Differential/Platelet   Comprehensive metabolic panel   Folate   Lipid Panel With LDL/HDL Ratio   T4, free   TSH   VITAMIN D 25 Hydroxy (Vit-D Deficiency, Fractures)   EKG 12-Lead   1. Other fatigue Audrey Goodman denies daytime somnolence and reports waking up still tired. Patent has a history of symptoms of daytime fatigue and morning fatigue. Audrey Goodman generally gets 7 or 8 hours of sleep per night, and states that she has generally restful sleep. Snoring is not present. Apneic episodes are not present. Epworth Sleepiness Score is 3.  Audrey Goodman does feel that her weight is causing her energy to be lower than it should be. Fatigue may be related to obesity, depression or many  other causes. Labs will be ordered, and in the meanwhile, Audrey Goodman will focus on self care including making healthy food choices, increasing physical activity and focusing on stress reduction.  Will check EKG and labs today.  - EKG 12-Lead - Vitamin B12 - CBC with Differential/Platelet - Comprehensive metabolic panel - Folate - T4, free - TSH  2. Shortness of breath on exertion Audrey Goodman notes increasing shortness of breath with exercising and seems to be worsening over time with weight gain. She notes getting  out of breath sooner with activity than she used to. This has gotten worse recently. Jelina denies shortness of breath at rest or orthopnea.  Audrey Goodman does feel that she gets out of breath more easily that she used to when she exercises. Lacretia's shortness of breath appears to be obesity related and exercise induced. She has agreed to work on weight loss and gradually increase exercise to treat her exercise induced shortness of breath. Will continue to monitor closely.  Will check IC and labs today.  - Vitamin B12 - CBC with Differential/Platelet - Comprehensive metabolic panel - Folate - T4, free - TSH  3. Type 2 diabetes mellitus with other specified complication, with long-term current use of insulin (HCC) Diabetes Mellitus: Not at goal. Medication: metformin 750 mg twice daily, Farxiga 5-10 mg daily, Trulicity 4.5 mg subcutaneously weekly, Levemir 25 units at bedtime, Novolog sliding scale. Issues reviewed: blood sugar goals, complications of diabetes mellitus, hypoglycemia prevention and treatment, exercise, and nutrition. Management per [name] in Endocrinology.  A1c 7.0 one month ago.  A1c was 9.0 ~1-2 years ago.  She would like better control and to decrease her medications.  Plan: The importance of regular follow up with PCP/Endo and all other specialists as scheduled was stressed to patient today. The patient will continue to focus on protein-rich, low simple carbohydrate foods. We reviewed the importance of hydration, regular exercise for stress reduction, and restorative sleep.   Lab Results  Component Value Date   HGBA1C 7.0 (A) 10/14/2020   HGBA1C 6.8 (A) 06/09/2020   HGBA1C 7.2 (A) 01/14/2020   Lab Results  Component Value Date   MICROALBUR 1.1 03/21/2020   LDLCALC 90 11/29/2020   CREATININE 1.03 (H) 11/29/2020   - Comprehensive metabolic panel  4. Hypertension associated with diabetes (HCC) At goal. Medications: Norvasc 5 mg daily, Aldactone 50 mg daily.  No concerns or  symptoms.   Plan:  Blood pressure is at goal.  Asymptomatic, stable.  Continue medications per PCP.  Consider ARB in the future for renal protection with her diabetes.  Avoid buying foods that are: processed, frozen, or prepackaged to avoid excess salt. We will watch for signs of hypotension as she continues lifestyle modifications. We will continue to monitor closely alongside her PCP and/or Specialist.  Regular follow up with PCP and specialists was also encouraged.   BP Readings from Last 3 Encounters:  12/02/20 126/65  11/29/20 103/70  10/14/20 120/70   Lab Results  Component Value Date   CREATININE 1.03 (H) 11/29/2020   5. Hyperlipidemia associated with type 2 diabetes mellitus (HCC) Course: At goal. Lipid-lowering medications: Crestor 10 mg daily.   Plan: Dietary changes: Increase soluble fiber, decrease simple carbohydrates, decrease saturated fat. Exercise changes: Moderate to vigorous-intensity aerobic activity 150 minutes per week or as tolerated. We will continue to monitor along with PCP/specialists as it pertains to her weight loss journey.  Will check labs today.  Lab Results  Component Value Date   CHOL 159 11/29/2020  HDL 51 11/29/2020   LDLCALC 90 11/29/2020   TRIG 99 11/29/2020   CHOLHDL 2.9 03/21/2020   Lab Results  Component Value Date   ALT 10 11/29/2020   AST 10 11/29/2020   ALKPHOS 95 11/29/2020   BILITOT 0.5 11/29/2020   The 10-year ASCVD risk score Denman George DC Jr., et al., 2013) is: 6.4%   Values used to calculate the score:     Age: 75 years     Sex: Female     Is Non-Hispanic African American: Yes     Diabetic: Yes     Tobacco smoker: No     Systolic Blood Pressure: 126 mmHg     Is BP treated: Yes     HDL Cholesterol: 51 mg/dL     Total Cholesterol: 159 mg/dL  - Lipid Panel With LDL/HDL Ratio  6. Vitamin D deficiency Not at goal.  She is taking OTC vitamin D 3,000 IU daily and a daily multivitamin.   Plan: Continue current OTC vitamin D  supplementation.  Will check vitamin D level today, as per below.  Lab Results  Component Value Date   VD25OH 54.4 11/29/2020   VD25OH 22.54 (L) 06/23/2014   - VITAMIN D 25 Hydroxy (Vit-D Deficiency, Fractures)  7. Other depression, with emotional eating Not at goal. Medication: Prozac 10 mg daily.  With GAD and depression.  She does a lot of boredom eating.  PHQ-9 is 5.  Plan:  Controlled currently.  Has emotional eating, but declines Dr. Dewaine Conger referral today.  Gave her mindful eating handouts. Behavior modification techniques were discussed today to help deal with emotional/non-hunger eating behaviors.  8. At risk for heart disease Due to Seini's current state of health and medical condition(s), she is at a higher risk for heart disease.  This puts the patient at much greater risk to subsequently develop cardiopulmonary conditions that can significantly affect patient's quality of life in a negative manner.    At least 23 minutes were spent on counseling Audrey Goodman about these concerns today, and I stressed the importance of reversing risks factors of obesity, especially truncal and visceral fat, hypertension, hyperlipidemia, and pre-diabetes.  The initial goal is to lose at least 5-10% of starting weight to help reduce these risk factors.  Counseling:  Intensive lifestyle modifications were discussed with Audrey Goodman as the most appropriate first line of treatment.  she will continue to work on diet, exercise, and weight loss efforts.  We will continue to reassess these conditions on a fairly regular basis in an attempt to decrease the patient's overall morbidity and mortality.  Evidence-based interventions for health behavior change were utilized today including the discussion of self monitoring techniques, problem-solving barriers, and SMART goal setting techniques.  Specifically, regarding patient's less desirable eating habits and patterns, we employed the technique of small changes when Elajah has not  been able to fully commit to her prudent nutritional plan.  9. Class 3 severe obesity with serious comorbidity and body mass index (BMI) of 45.0 to 49.9 in adult, unspecified obesity type (HCC)  Audrey Goodman is currently in the action stage of change and her goal is to continue with weight loss efforts. I recommend Audrey Goodman begin the structured treatment plan as follows:  She has agreed to the Category 3 Plan.  Exercise goals:  As is.    Behavioral modification strategies: decreasing simple carbohydrates, decreasing liquid calories, meal planning and cooking strategies, and planning for success.  She was informed of the importance of frequent follow-up visits to  maximize her success with intensive lifestyle modifications for her multiple health conditions. She was informed we would discuss her lab results at her next visit unless there is a critical issue that needs to be addressed sooner. Audrey Goodman agreed to keep her next visit at the agreed upon time to discuss these results.  Objective:   Blood pressure 103/70, pulse 84, temperature 99.2 F (37.3 C), height 5\' 5"  (1.651 m), weight 283 lb (128.4 kg), SpO2 98 %. Body mass index is 47.09 kg/m.  EKG: Normal sinus rhythm, rate 87 bpm.  Indirect Calorimeter completed today shows a VO2 of 285 and a REE of 1987.  Her calculated basal metabolic rate is thus her basal metabolic rate is better than expected.  General: Cooperative, alert, well developed, in no acute distress. HEENT: Conjunctivae and lids unremarkable. Cardiovascular: Regular rhythm.  Lungs: Normal work of breathing. Neurologic: No focal deficits.   Lab Results  Component Value Date   CREATININE 1.03 (H) 11/29/2020   BUN 19 11/29/2020   NA 136 11/29/2020   K 4.9 11/29/2020   CL 99 11/29/2020   CO2 22 11/29/2020   Lab Results  Component Value Date   ALT 10 11/29/2020   AST 10 11/29/2020   ALKPHOS 95 11/29/2020   BILITOT 0.5 11/29/2020   Lab Results  Component Value Date    HGBA1C 7.0 (A) 10/14/2020   HGBA1C 6.8 (A) 06/09/2020   HGBA1C 7.2 (A) 01/14/2020   HGBA1C 6.9 (A) 08/20/2019   HGBA1C 6.5 (A) 04/21/2019   Lab Results  Component Value Date   TSH 3.410 11/29/2020   Lab Results  Component Value Date   CHOL 159 11/29/2020   HDL 51 11/29/2020   LDLCALC 90 11/29/2020   TRIG 99 11/29/2020   CHOLHDL 2.9 03/21/2020   Lab Results  Component Value Date   VD25OH 54.4 11/29/2020   VD25OH 22.54 (L) 06/23/2014   Lab Results  Component Value Date   WBC 7.6 11/29/2020   HGB 14.7 11/29/2020   HCT 46.4 11/29/2020   MCV 90 11/29/2020   PLT 338 11/29/2020   Attestation Statements:   This is the patient's first visit at [facility]. The patient's NEW PATIENT PACKET was reviewed at length. Included in the packet: current and past health history, medications, allergies, ROS, gynecologic history (women only), surgical history, family history, social history, weight history, weight loss surgery history (for those that have had weight loss surgery), nutritional evaluation, mood and food questionnaire, PHQ9, Epworth questionnaire, sleep habits questionnaire, patient life and health improvement goals questionnaire. These will all be scanned into the patient's chart under media.   During the visit, I independently reviewed the patient's EKG, bioimpedance scale results, and indirect calorimeter results. I used this information to tailor a meal plan for the patient that will help her to lose weight and will improve her obesity-related conditions going forward. I performed a medically necessary appropriate examination and/or evaluation. I discussed the assessment and treatment plan with the patient. The patient was provided an opportunity to ask questions and all were answered. The patient agreed with the plan and demonstrated an understanding of the instructions. Labs were ordered at this visit and will be reviewed at the next visit unless more critical results  need to be addressed immediately. Clinical information was updated and documented in the EMR.   I, 01/30/2021, CMA, am acting as Insurance claims handler for Energy manager, DO.  I have reviewed the above documentation for accuracy and completeness, and I  agree with the above. Carlye Grippe, D.O.  The 21st Century Cures Act was signed into law in 2016 which includes the topic of electronic health records.  This provides immediate access to information in MyChart.  This includes consultation notes, operative notes, office notes, lab results and pathology reports.  If you have any questions about what you read please let us know at your next visit so we can discuss your concerns and take corrective action if need be.  We are right here with you.

## 2020-12-13 ENCOUNTER — Encounter (INDEPENDENT_AMBULATORY_CARE_PROVIDER_SITE_OTHER): Payer: Self-pay | Admitting: Family Medicine

## 2020-12-13 ENCOUNTER — Ambulatory Visit (INDEPENDENT_AMBULATORY_CARE_PROVIDER_SITE_OTHER): Payer: 59 | Admitting: Family Medicine

## 2020-12-13 ENCOUNTER — Other Ambulatory Visit: Payer: Self-pay

## 2020-12-13 VITALS — BP 121/74 | HR 90 | Temp 99.5°F | Ht 65.0 in | Wt 281.0 lb

## 2020-12-13 DIAGNOSIS — E559 Vitamin D deficiency, unspecified: Secondary | ICD-10-CM

## 2020-12-13 DIAGNOSIS — Z9189 Other specified personal risk factors, not elsewhere classified: Secondary | ICD-10-CM

## 2020-12-13 DIAGNOSIS — E1159 Type 2 diabetes mellitus with other circulatory complications: Secondary | ICD-10-CM | POA: Diagnosis not present

## 2020-12-13 DIAGNOSIS — E1169 Type 2 diabetes mellitus with other specified complication: Secondary | ICD-10-CM | POA: Diagnosis not present

## 2020-12-13 DIAGNOSIS — I152 Hypertension secondary to endocrine disorders: Secondary | ICD-10-CM

## 2020-12-13 DIAGNOSIS — Z6841 Body Mass Index (BMI) 40.0 and over, adult: Secondary | ICD-10-CM

## 2020-12-13 DIAGNOSIS — R7989 Other specified abnormal findings of blood chemistry: Secondary | ICD-10-CM

## 2020-12-13 DIAGNOSIS — Z794 Long term (current) use of insulin: Secondary | ICD-10-CM

## 2020-12-13 DIAGNOSIS — E785 Hyperlipidemia, unspecified: Secondary | ICD-10-CM

## 2020-12-16 ENCOUNTER — Encounter: Payer: 59 | Admitting: Family

## 2020-12-19 ENCOUNTER — Telehealth: Payer: Self-pay

## 2020-12-19 NOTE — Telephone Encounter (Signed)
Tylenol and ibuprofen. Ty.

## 2020-12-19 NOTE — Telephone Encounter (Signed)
Pt called stating she tested Covid + today with onset of symptoms Saturday with a headache.  She had a fever of 100.2 last night and today it has been 99.3.  She has an itchy, irritated throat and she feels like she has a really bad sinus infection.  Her headache continues to hurt mildly behind her eyes.  Pt stated she works from home and will continue to remain there. Ioffered pt a virtual visit, but she stated she does not feel "that bad" to the point she needs a visit.  I told her to keep in mind, should she need antiviral meds they would need to be prescribed within five days of onset of symptoms. Pt acknowledged understanding and will let office know if she feels worse.  Pt would like to know what she can do OTC while not seeing professional medical attention.  Please advise.

## 2020-12-19 NOTE — Telephone Encounter (Signed)
Patient notified

## 2020-12-19 NOTE — Telephone Encounter (Signed)
Patient refuses virtual appointment

## 2020-12-20 NOTE — Progress Notes (Signed)
[facility] [address] [place], [place] [address] [phone]  Date:  12/21/2020   Name:  Audrey Goodman   DOB:  1971-05-08   MRN:  [id number]  PCP:  [name], NP    Chief Complaint: No chief complaint on file.   History of Present Illness:  Audrey Goodman is a 50 y.o. very pleasant female patient who presents with the following:  Virtual visit today for concern of covid 77 + Patient location is home,.  Patient identity confirmed with 2 factors, she gives consent for virtual visit today.  The patient and myself are present on the call today  Pt of Melissa O'Sullivan History of DM, HTN, hyperlipidemia, obesity  I did see her once in 2020 Covid vaccinated  She notes dx this past Sunday- today is Wednesday Sx started with fever- she turned up positive for COVID-19 via home test on Monday  At this point she has sx of headache, head congestion, fatigue, sore throat, body aches  Breathing is ok She has an occasional cough  She does not have a pulse ox   She is on crestor   Patient Active Problem List   Diagnosis Date Noted   Left Achilles tendinitis 06/19/2017   Depression 07/13/2015   Preventative health care 06/23/2014   PCOS (polycystic ovarian syndrome) 09/10/2013   CHOLELITHIASIS 02/20/2008   Hyperlipidemia 08/06/2006   Type 2 diabetes mellitus, uncontrolled (HCC) 07/30/2006   HYPERTENSION, BENIGN ESSENTIAL 07/30/2006   MORBID OBESITY 07/25/2006    Past Medical History:  Diagnosis Date   Anxiety    Arthritis    left shoulder   Back pain    Chronic kidney disease    stage 1   Depression    Diabetes mellitus without complication (HCC)    type 2- on meds   Diverticulitis    High cholesterol    History of chicken pox    Hyperlipidemia    on meds   Hypertension    on meds   Joint pain    Lactose intolerance    PCOS (polycystic ovarian syndrome)    Seasonal allergies    Shingles     Sleep apnea    does NOT use CPAP   SVD (spontaneous vaginal delivery) 1995   x 1   Type 2 diabetes mellitus, uncontrolled (HCC) 07/30/2006   Qualifier: Diagnosis of  By: Lanier Prude  MD, Cathrine Muster      Past Surgical History:  Procedure Laterality Date   ANKLE FRACTURE SURGERY  05/28/1989   right   CHOLECYSTECTOMY  05/28/2006   COLONOSCOPY  1989   due to blood in stool and diverticulitis per pt   HYSTEROSCOPY N/A 09/13/2014   Procedure: HYSTEROSCOPY with IUD removal;  Surgeon: Mitchel Honour, DO;  Location: [facility];  Service: Gynecology;  Laterality: N/A;  need longest speculum and instruments that aresavailable due to patient's habitus (BMI ~60)    Social History   Tobacco Use   Smoking status: Never   Smokeless tobacco: Never  Vaping Use   Vaping Use: Never used  Substance Use Topics   Alcohol use: No    Alcohol/week: 0.0 standard drinks   Drug use: No    Family History  Problem Relation Age of Onset   Obesity Mother    Colon polyps Mother    Cancer Mother        breast   Hyperlipidemia Mother    Heart disease Mother  Hypertension Mother    Mental illness Mother        depression   Diabetes Mother    Alcoholism Father    Hyperlipidemia Father    Stroke Father    Hypertension Father    Cancer Maternal Grandmother        colon   Diabetes Maternal Grandmother    Diabetes Maternal Aunt    Colon polyps Maternal Uncle    Colon cancer Maternal Uncle    Diabetes Maternal Uncle    Diabetes Maternal Uncle    Esophageal cancer Neg Hx    Rectal cancer Neg Hx    Stomach cancer Neg Hx     Allergies  Allergen Reactions   Lisinopril Swelling    Swelling of the left side of tongue   Lipitor [Atorvastatin] Other (See Comments)    Myalgia/foot cramping    Medication list has been reviewed and updated.  Current Outpatient Medications on File Prior to Visit  Medication Sig Dispense Refill   amLODipine (NORVASC) 5 MG tablet TAKE 1 TABLET BY MOUTH EVERY DAY 90 tablet 1    cetirizine (ZYRTEC) 10 MG tablet Take 10 mg by mouth daily.     Dulaglutide (TRULICITY) 4.5 MG/0.5ML SOPN Inject 0.5 mLs (4.5 mg total) as directed once a week. 8 mL 3   FARXIGA 10 MG TABS tablet TAKE 1/2 TO 1 TABLET BY MOUTH DAILY BEFORE BREAKFAST AS DIRECTED 90 tablet 3   FLUoxetine (PROZAC) 10 MG tablet TAKE 1 TABLET BY MOUTH EVERY DAY 90 tablet 0   insulin aspart (NOVOLOG FLEXPEN) 100 UNIT/ML FlexPen INJECT 15-25 UNITS (DEPENDING ON MEAL SIZE) UNDER THE SKIN 3 TIMES A DAY 60 mL 3   insulin detemir (LEVEMIR FLEXTOUCH) 100 UNIT/ML FlexPen Inject 25 Units into the skin at bedtime. 30 mL 1   Insulin Pen Needle 32G X 4 MM MISC Use to inject insulin 4 times daily 400 each 3   metFORMIN (GLUCOPHAGE-XR) 750 MG 24 hr tablet TAKE 1 TABLET BY MOUTH TWICE A DAY 180 tablet 3   Multiple Vitamin (MULTIVITAMIN PO) Take 1 tablet by mouth daily at 6 (six) AM.     rosuvastatin (CRESTOR) 10 MG tablet TAKE 1 TABLET BY MOUTH EVERY DAY 90 tablet 1   spironolactone (ALDACTONE) 50 MG tablet TAKE 1 TABLET BY MOUTH TWICE A DAY 180 tablet 3   No current facility-administered medications on file prior to visit.    Review of Systems:  As per HPI- otherwise negative.   Physical Examination: There were no vitals filed for this visit. There were no vitals filed for this visit. There is no height or weight on file to calculate BMI. Ideal Body Weight:    Patient observed via video monitor.  She looks well but obese.  No shortness of breath or distress is noted  Assessment and Plan: COVID-19 - Plan: nirmatrelvir/ritonavir EUA (PAXLOVID) TABS Virtual visit today for diagnosis of COVID-19 Patient is vaccinated but is having troublesome symptoms. She would like to start antivirals.  I called in a prescription for Paxlovid loaded.  I did advise her to hold her statin for 10 days  She is currently working from home.  I offered to give her a work note but patient states she cannot afford to take time off right now.   I did encourage her to rest, use over-the-counter medications as needed.  She is asked to contact us if not feeling better within the next few days, sooner if worse  Signed Abbe Amsterdam, MD

## 2020-12-21 ENCOUNTER — Other Ambulatory Visit: Payer: Self-pay | Admitting: Family Medicine

## 2020-12-21 ENCOUNTER — Telehealth (INDEPENDENT_AMBULATORY_CARE_PROVIDER_SITE_OTHER): Payer: 59 | Admitting: Family Medicine

## 2020-12-21 ENCOUNTER — Encounter: Payer: Self-pay | Admitting: Family Medicine

## 2020-12-21 ENCOUNTER — Other Ambulatory Visit: Payer: Self-pay

## 2020-12-21 VITALS — Temp 99.3°F | Ht 65.0 in | Wt 281.0 lb

## 2020-12-21 DIAGNOSIS — U071 COVID-19: Secondary | ICD-10-CM | POA: Diagnosis not present

## 2020-12-21 MED ORDER — NIRMATRELVIR/RITONAVIR (PAXLOVID)TABLET
3.0000 | ORAL_TABLET | Freq: Two times a day (BID) | ORAL | 0 refills | Status: AC
Start: 2020-12-21 — End: 2020-12-26

## 2020-12-22 NOTE — Progress Notes (Signed)
Chief Complaint:   OBESITY Audrey Goodman is here to discuss her progress with her obesity treatment plan along with follow-up of her obesity related diagnoses.   Today's visit was #: 2 Starting weight: 283 lbs Starting date: 11/29/2020 Today's weight: 281 lbs Today's date: 12/13/2020 Weight change since last visit: 2 lbs Total lbs lost to date: 2 lbs Body mass index is 46.76 kg/m.  Total weight loss percentage to date: -0.71%  Interim History:  Audrey Goodman is here today for her first follow-up office visit since starting the program with [name].  All blood work/ lab tests that were recently ordered by myself or an outside provider were reviewed with patient today per their request.   Extended time was spent counseling her on all new disease processes that were discovered or preexisting ones that are worsening.  Audrey Goodman understands that many of these abnormalities will need to monitored regularly along with the current treatment plan of prudent dietary changes, in which we are making each and every office visit, to improve these health parameters.  Audrey Goodman is tired of eating chicken and pork everyday. Needs meal planning ideas.  Family members eat poorly around her, which makes it difficult for her to avoid those foods.    Plan:  Meal prep and recipes reviewed with patient.  Handouts given on meal ides after extensive discussion with patient was had.  Current Meal Plan: the Category 3 Plan for 75% of the time.  Current Exercise Plan: None. Current Anti-Obesity Medications: Trulicity 4.5 mg subcutaneously weekly. Side effects: None.  Assessment/Plan:   1. Elevated serum creatinine Asymptomatic.  Not on ACE or ARB.  Plan:  Extensive counseling done with her regarding renal function and importance of blood pressure and blood sugar tight control.  2. Type 2 diabetes mellitus with other specified complication, with long-term current use of insulin (HCC) Diabetes Mellitus: Not at goal. Medication:  metformin XR 750 mg daily, Levemir, Novolog, Farxiga 10 mg daily, Trulicity 4.5 mg subcutaneously weekly. Issues reviewed: blood sugar goals, complications of diabetes mellitus, hypoglycemia prevention and treatment, exercise, and nutrition.  FBS 107-130 (120s mostly).  Management per Endo, [name].  Plan: Discussed labs with patient today. The importance of regular follow up with PCP and all other specialists as scheduled was stressed to patient today. The patient will continue to focus on protein-rich, low simple carbohydrate foods. We reviewed the importance of hydration, regular exercise for stress reduction, and restorative sleep.   Lab Results  Component Value Date   HGBA1C 7.0 (A) 10/14/2020   HGBA1C 6.8 (A) 06/09/2020   HGBA1C 7.2 (A) 01/14/2020   Lab Results  Component Value Date   MICROALBUR 1.1 03/21/2020   LDLCALC 90 11/29/2020   CREATININE 1.03 (H) 11/29/2020   3. Hypertension associated with diabetes (HCC) At goal. Medications: Norvasc 5 mg daily, spironolactone 50 mg daily.   Plan: Discussed labs with patient today.  Avoid buying foods that are: processed, frozen, or prepackaged to avoid excess salt. We will watch for signs of hypotension as Audrey Goodman continues lifestyle modifications. We will continue to monitor closely alongside her PCP and/or Specialist.  Regular follow up with PCP and specialists was also encouraged.   BP Readings from Last 3 Encounters:  12/13/20 121/74  12/02/20 126/65  11/29/20 103/70   Lab Results  Component Value Date   CREATININE 1.03 (H) 11/29/2020   4. Hyperlipidemia associated with type 2 diabetes mellitus (HCC) Course: At goal. Lipid-lowering medications: Crestor 10 mg daily.  LDL is 90.  Takes medications daily.  Compliance good.  Tolerating well.  Plan:  Discussed labs with patient today.  Not quite at goal of less than 70.  Continue prudent nutritional plan and Crestor.  Recommend recheck in 4 months after eating healthy and if no  improvement, increase dose of Crestor.  Dietary changes: Increase soluble fiber, decrease simple carbohydrates, decrease saturated fat. Exercise changes: Moderate to vigorous-intensity aerobic activity 150 minutes per week or as tolerated. We will continue to monitor along with PCP/specialists as it pertains to her weight loss journey.  Lab Results  Component Value Date   CHOL 159 11/29/2020   HDL 51 11/29/2020   LDLCALC 90 11/29/2020   TRIG 99 11/29/2020   CHOLHDL 2.9 03/21/2020   Lab Results  Component Value Date   ALT 10 11/29/2020   AST 10 11/29/2020   ALKPHOS 95 11/29/2020   BILITOT 0.5 11/29/2020   The 10-year ASCVD risk score Denman George DC Jr., et al., 2013) is: 5.4%   Values used to calculate the score:     Age: 89 years     Sex: Female     Is Non-Hispanic African American: Yes     Diabetic: Yes     Tobacco smoker: No     Systolic Blood Pressure: 121 mmHg     Is BP treated: Yes     HDL Cholesterol: 51 mg/dL     Total Cholesterol: 159 mg/dL  5. Vitamin D deficiency At goal.  Audrey Goodman is taking OTC vitamin D 3,000 IU daily.  Plan:  Discussed labs with patient today.  Continue current OTC vitamin D supplementation.  Follow-up for routine testing of Vitamin D, at least 2-3 times per year to avoid over-replacement.  Lab Results  Component Value Date   VD25OH 54.4 11/29/2020   VD25OH 22.54 (L) 06/23/2014   6. At risk for heart disease Due to Bert's current state of health and medical condition(s), Audrey Goodman is at a higher risk for heart disease.  This puts the patient at much greater risk to subsequently develop cardiopulmonary conditions that can significantly affect patient's quality of life in a negative manner.    At least 23 minutes were spent on counseling Audrey Goodman about these concerns today, and I stressed the importance of reversing risks factors of obesity, especially truncal and visceral fat, hypertension, hyperlipidemia, and pre-diabetes.  The initial goal is to lose at least 5-10%  of starting weight to help reduce these risk factors.  Counseling:  Intensive lifestyle modifications were discussed with Audrey Goodman as the most appropriate first line of treatment.  Audrey Goodman will continue to work on diet, exercise, and weight loss efforts.  We will continue to reassess these conditions on a fairly regular basis in an attempt to decrease the patient's overall morbidity and mortality.  Evidence-based interventions for health behavior change were utilized today including the discussion of self monitoring techniques, problem-solving barriers, and SMART goal setting techniques.  Specifically, regarding patient's less desirable eating habits and patterns, we employed the technique of small changes when Mora has not been able to fully commit to her prudent nutritional plan.  7. Obesity with current BMI of 46.8  Course: Lorane is currently in the action stage of change. As such, her goal is to continue with weight loss efforts.   Nutrition goals: Audrey Goodman has agreed to the Category 3 Plan.   Exercise goals:  As is.  Behavioral modification strategies: increasing lean protein intake, decreasing simple carbohydrates, increasing water intake, meal planning and  cooking strategies, keeping healthy foods in the home, ways to avoid boredom eating, and planning for success.  Cherita has agreed to follow-up with our clinic in 2-3 weeks with App. Audrey Goodman was informed of the importance of frequent follow-up visits to maximize her success with intensive lifestyle modifications for her multiple health conditions.   Objective:   Blood pressure 121/74, pulse 90, temperature 99.5 F (37.5 C), height 5\' 5"  (1.651 m), weight 281 lb (127.5 kg), SpO2 99 %. Body mass index is 46.76 kg/m.  General: Cooperative, alert, well developed, in no acute distress. HEENT: Conjunctivae and lids unremarkable. Cardiovascular: Regular rhythm.  Lungs: Normal work of breathing. Neurologic: No focal deficits.   Lab Results  Component Value  Date   CREATININE 1.03 (H) 11/29/2020   BUN 19 11/29/2020   NA 136 11/29/2020   K 4.9 11/29/2020   CL 99 11/29/2020   CO2 22 11/29/2020   Lab Results  Component Value Date   ALT 10 11/29/2020   AST 10 11/29/2020   ALKPHOS 95 11/29/2020   BILITOT 0.5 11/29/2020   Lab Results  Component Value Date   HGBA1C 7.0 (A) 10/14/2020   HGBA1C 6.8 (A) 06/09/2020   HGBA1C 7.2 (A) 01/14/2020   HGBA1C 6.9 (A) 08/20/2019   HGBA1C 6.5 (A) 04/21/2019   Lab Results  Component Value Date   TSH 3.410 11/29/2020   Lab Results  Component Value Date   CHOL 159 11/29/2020   HDL 51 11/29/2020   LDLCALC 90 11/29/2020   TRIG 99 11/29/2020   CHOLHDL 2.9 03/21/2020   Lab Results  Component Value Date   VD25OH 54.4 11/29/2020   VD25OH 22.54 (L) 06/23/2014   Lab Results  Component Value Date   WBC 7.6 11/29/2020   HGB 14.7 11/29/2020   HCT 46.4 11/29/2020   MCV 90 11/29/2020   PLT 338 11/29/2020   Attestation Statements:   Reviewed by clinician on day of visit: allergies, medications, problem list, medical history, surgical history, family history, social history, and previous encounter notes.  I, 01/30/2021, CMA, am acting as Insurance claims handler for Energy manager, DO.  I have reviewed the above documentation for accuracy and completeness, and I agree with the above. [employer], D.O.  The 21st Century Cures Act was signed into law in 2016 which includes the topic of electronic health records.  This provides immediate access to information in MyChart.  This includes consultation notes, operative notes, office notes, lab results and pathology reports.  If you have any questions about what you read please let 2017 know at your next visit so we can discuss your concerns and take corrective action if need be.  We are right here with you.

## 2020-12-27 ENCOUNTER — Encounter: Payer: 59 | Admitting: Family

## 2020-12-28 ENCOUNTER — Encounter: Payer: Self-pay | Admitting: Family Medicine

## 2020-12-28 ENCOUNTER — Ambulatory Visit (INDEPENDENT_AMBULATORY_CARE_PROVIDER_SITE_OTHER): Payer: 59 | Admitting: Family Medicine

## 2021-01-14 ENCOUNTER — Other Ambulatory Visit: Payer: Self-pay | Admitting: Internal Medicine

## 2021-01-16 ENCOUNTER — Encounter: Payer: Self-pay | Admitting: Family

## 2021-01-23 ENCOUNTER — Ambulatory Visit (INDEPENDENT_AMBULATORY_CARE_PROVIDER_SITE_OTHER): Payer: 59 | Admitting: Family

## 2021-01-23 ENCOUNTER — Encounter: Payer: Self-pay | Admitting: Family

## 2021-01-23 ENCOUNTER — Telehealth: Payer: Self-pay | Admitting: Family

## 2021-01-23 ENCOUNTER — Other Ambulatory Visit: Payer: Self-pay

## 2021-01-23 VITALS — BP 119/62 | HR 83 | Temp 99.2°F | Resp 16 | Ht 65.0 in | Wt 281.0 lb

## 2021-01-23 DIAGNOSIS — Z23 Encounter for immunization: Secondary | ICD-10-CM | POA: Diagnosis not present

## 2021-01-23 DIAGNOSIS — F32A Depression, unspecified: Secondary | ICD-10-CM | POA: Diagnosis not present

## 2021-01-23 DIAGNOSIS — H6123 Impacted cerumen, bilateral: Secondary | ICD-10-CM | POA: Insufficient documentation

## 2021-01-23 DIAGNOSIS — H6121 Impacted cerumen, right ear: Secondary | ICD-10-CM | POA: Diagnosis not present

## 2021-01-23 DIAGNOSIS — Z0000 Encounter for general adult medical examination without abnormal findings: Secondary | ICD-10-CM

## 2021-01-23 MED ORDER — FLUOXETINE HCL 20 MG PO CAPS: 20.0000 mg | ORAL_CAPSULE | Freq: Every day | ORAL | 0 refills | Status: DC

## 2021-01-23 NOTE — Patient Instructions (Addendum)
Please call [facility] and request an appointment with a counselor- [phone]. Please increase prozac to 20mg  once daily

## 2021-01-23 NOTE — Assessment & Plan Note (Signed)
Uncontrolled. Will increase prozac to 20mg  once daily. Recommended that pt establish with a counselor.

## 2021-01-23 NOTE — Progress Notes (Signed)
Subjective:     Patient ID: Audrey Goodman, female    DOB: 03/10/1971, 50 y.o.   MRN: [id number]  Chief Complaint  Patient presents with   Annual Exam    HPI Patient is in today for CPX.  Immunizations: pfizer x 3, prevar 13 and pneumovax 23, tdap 2017 Diet: diet is off/on Wt Readings from Last 3 Encounters:  01/23/21 281 lb (127.5 kg)  12/21/20 281 lb (127.5 kg)  12/13/20 281 lb (127.5 kg)  Exercise: intermittent  Colonoscopy: 12/02/20 Pap Smear: 10/23/17 Mammogram:11/22/20 Vision: up to date Dental: up to date  Feels burned out about her diabetes.  Her aunt is blind (new), she is trying to help  Health Maintenance Due  Topic Date Due   Hepatitis C Screening  Never done   PAP SMEAR-Modifier  10/23/2020   MAMMOGRAM  11/22/2020   URINE MICROALBUMIN  03/21/2021    Past Medical History:  Diagnosis Date   Anxiety    Arthritis    left shoulder   Back pain    Chronic kidney disease    stage 1   Depression    Diabetes mellitus without complication (HCC)    type 2- on meds   Diverticulitis    High cholesterol    History of chicken pox    Hyperlipidemia    on meds   Hypertension    on meds   Joint pain    Lactose intolerance    PCOS (polycystic ovarian syndrome)    Seasonal allergies    Shingles    Sleep apnea    does NOT use CPAP   SVD (spontaneous vaginal delivery) 1995   x 1   Type 2 diabetes mellitus, uncontrolled (HCC) 07/30/2006   Qualifier: Diagnosis of  By: Lanier Prude  MD, Cathrine Muster      Past Surgical History:  Procedure Laterality Date   ANKLE FRACTURE SURGERY  05/28/1989   right   CHOLECYSTECTOMY  05/28/2006   COLONOSCOPY  1989   due to blood in stool and diverticulitis per pt   HYSTEROSCOPY N/A 09/13/2014   Procedure: HYSTEROSCOPY with IUD removal;  Surgeon: Mitchel Honour, DO;  Location: [facility];  Service: Gynecology;  Laterality: N/A;  need longest speculum and instruments that aresavailable due to patient's habitus (BMI ~60)    Family History   Problem Relation Age of Onset   Obesity Mother    Colon polyps Mother    Cancer Mother        breast   Hyperlipidemia Mother    Heart disease Mother    Hypertension Mother    Mental illness Mother        depression   Diabetes Mother    Alcoholism Father    Hyperlipidemia Father    Stroke Father    Hypertension Father    Cancer Maternal Grandmother        colon   Diabetes Maternal Grandmother    Diabetes Maternal Aunt    Glaucoma Maternal Aunt    Colon polyps Maternal Uncle    Colon cancer Maternal Uncle    Diabetes Maternal Uncle    Diabetes Maternal Uncle    Esophageal cancer Neg Hx    Rectal cancer Neg Hx    Stomach cancer Neg Hx     Social History   Socioeconomic History   Marital status: Single    Spouse name: Not on file   Number of children: Not on file   Years of education: Not on file   Highest education  level: Not on file  Occupational History   Not on file  Tobacco Use   Smoking status: Never   Smokeless tobacco: Never  Vaping Use   Vaping Use: Never used  Substance and Sexual Activity   Alcohol use: No    Alcohol/week: 0.0 standard drinks   Drug use: No   Sexual activity: Yes    Partners: Male    Birth control/protection: Pill  Other Topics Concern   Not on file  Social History Narrative   Single- lives alone   Son 21- senior at [employer]   Enjoys reading   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     FARXIGA 10 MG TABS tablet TAKE 1/2 TO 1 TABLET BY MOUTH DAILY  BEFORE BREAKFAST AS DIRECTED 90 tablet 3   insulin aspart (NOVOLOG FLEXPEN) 100 UNIT/ML FlexPen INJECT 15-25 UNITS (DEPENDING ON MEAL SIZE) UNDER THE SKIN 3 TIMES A DAY 60 mL 3   insulin detemir (LEVEMIR FLEXTOUCH) 100 UNIT/ML FlexPen Inject 25 Units into the skin at bedtime. 30 mL 1   Insulin Pen Needle 32G X 4 MM MISC Use to inject insulin 4 times daily 400 each 3   metFORMIN (GLUCOPHAGE-XR) 750 MG 24 hr tablet TAKE 1 TABLET BY MOUTH TWICE A DAY 180 tablet 3   Multiple Vitamin (MULTIVITAMIN PO) Take 1 tablet by mouth daily at 6 (six) AM.     rosuvastatin (CRESTOR) 10 MG tablet TAKE 1 TABLET BY MOUTH EVERY DAY 90 tablet 1   spironolactone (ALDACTONE) 50 MG tablet TAKE 1 TABLET BY MOUTH TWICE A DAY 180 tablet 3   TRULICITY 4.5 MG/0.5ML SOPN INJECT CONTENTS OF 1 PEN (4.5MG ) UNDER THE SKIN ONCE WEEKLY 6 mL 3   FLUoxetine (PROZAC) 10 MG tablet TAKE 1 TABLET BY MOUTH EVERY DAY 90 tablet 0   No facility-administered medications prior to visit.    Allergies  Allergen Reactions   Lisinopril Swelling    Swelling of the left side of tongue   Lipitor [Atorvastatin] Other (See Comments)    Myalgia/foot cramping    Review of Systems  Constitutional:  Negative for weight loss.  HENT:  Positive for hearing loss (right ear feels blocked). Negative for congestion.   Eyes:  Negative for double vision.  Respiratory:  Negative for cough and shortness of breath.   Cardiovascular:  Negative for chest pain.  Gastrointestinal:  Negative for blood in stool, constipation and diarrhea.  Genitourinary:  Negative for dysuria, frequency and hematuria.  Musculoskeletal:  Negative for joint pain and myalgias.  Neurological:  Negative for headaches.  Psychiatric/Behavioral:         See HPI      Objective:    Physical Exam Constitutional:      Appearance: Normal appearance. She is well-developed.  HENT:     Right Ear: There is impacted cerumen.     Left Ear: Tympanic membrane and ear canal normal.   Cardiovascular:  Rate and Rhythm: Normal rate and regular rhythm.     Heart sounds: Normal heart sounds. No murmur heard. Pulmonary:     Effort: Pulmonary effort is normal. No respiratory distress.     Breath sounds: Normal breath sounds. No wheezing.  Musculoskeletal:        General: No swelling.  Skin:    General: Skin is warm and dry.  Neurological:     Mental Status: She is alert.  Psychiatric:        Behavior: Behavior normal.        Thought Content: Thought content normal.        Judgment: Judgment normal.    BP 119/62 (BP Location: Right Arm, Patient Position: Sitting, Cuff Size: Large)   Pulse 83   Temp 99.2 F (37.3 C) (Oral)   Resp 16   Ht 5\' 5"  (1.651 m)   Wt 281 lb (127.5 kg)   SpO2 99%   BMI 46.76 kg/m  Wt Readings from Last 3 Encounters:  01/23/21 281 lb (127.5 kg)  12/21/20 281 lb (127.5 kg)  12/13/20 281 lb (127.5 kg)       Assessment & Plan:   Problem List Items Addressed This Visit       Unprioritized   Preventative health care    Flu shot today. Discussed healthy diet, exercise, weight loss. Pap/mammo up to date.       Impacted cerumen of right ear - Primary    After verbal consent, right ear was flushed by CMA. Pt tolerated procedure.       Depression    Uncontrolled. Will increase prozac to 20mg  once daily. Recommended that pt establish with a counselor.        Relevant Medications   FLUoxetine (PROZAC) 20 MG capsule   Other Visit Diagnoses     Needs flu shot       Relevant Orders   Flu Vaccine QUAD 6+ mos PF IM (Fluarix Quad PF) (Completed)       I have discontinued Keylani C. Wisener's FLUoxetine. I am also having her start on FLUoxetine. Additionally, I am having her maintain her Farxiga, spironolactone, Insulin Pen Needle, Levemir FlexTouch, rosuvastatin, amLODipine, NovoLOG FlexPen, metFORMIN, Multiple Vitamin (MULTIVITAMIN PO), cetirizine, and Trulicity.  Meds ordered this encounter  Medications   FLUoxetine (PROZAC) 20  MG capsule    Sig: Take 1 capsule (20 mg total) by mouth daily.    Dispense:  90 capsule    Refill:  0    Order Specific Question:   Supervising Provider    Answer:   12/15/20 A [[id number]]

## 2021-01-23 NOTE — Telephone Encounter (Signed)
Can you please call [facility] and request copy of mammo report?

## 2021-01-23 NOTE — Assessment & Plan Note (Signed)
Flu shot today. Discussed healthy diet, exercise, weight loss. Pap/mammo up to date.

## 2021-01-23 NOTE — Assessment & Plan Note (Signed)
After verbal consent, right ear was flushed by CMA. Pt tolerated procedure.

## 2021-01-23 NOTE — Telephone Encounter (Signed)
Records release request sent

## 2021-01-30 ENCOUNTER — Other Ambulatory Visit: Payer: Self-pay | Admitting: Internal Medicine

## 2021-02-20 ENCOUNTER — Ambulatory Visit: Payer: 59 | Admitting: Internal Medicine

## 2021-02-24 ENCOUNTER — Ambulatory Visit: Payer: 59 | Admitting: Family

## 2021-03-14 ENCOUNTER — Other Ambulatory Visit: Payer: Self-pay | Admitting: Internal Medicine

## 2021-03-20 ENCOUNTER — Other Ambulatory Visit: Payer: Self-pay | Admitting: Family Medicine

## 2021-04-08 ENCOUNTER — Other Ambulatory Visit: Payer: Self-pay | Admitting: Family

## 2021-04-29 ENCOUNTER — Other Ambulatory Visit: Payer: Self-pay | Admitting: Family

## 2021-05-06 ENCOUNTER — Other Ambulatory Visit: Payer: Self-pay | Admitting: Internal Medicine

## 2021-05-06 DIAGNOSIS — E1165 Type 2 diabetes mellitus with hyperglycemia: Secondary | ICD-10-CM

## 2021-05-14 ENCOUNTER — Other Ambulatory Visit: Payer: Self-pay | Admitting: Internal Medicine

## 2021-05-31 ENCOUNTER — Other Ambulatory Visit: Payer: Self-pay

## 2021-05-31 ENCOUNTER — Encounter: Payer: Self-pay | Admitting: Internal Medicine

## 2021-05-31 ENCOUNTER — Ambulatory Visit: Payer: 59 | Admitting: Internal Medicine

## 2021-05-31 VITALS — BP 130/72 | HR 92 | Ht 65.0 in | Wt 275.0 lb

## 2021-05-31 DIAGNOSIS — E282 Polycystic ovarian syndrome: Secondary | ICD-10-CM | POA: Diagnosis not present

## 2021-05-31 DIAGNOSIS — E785 Hyperlipidemia, unspecified: Secondary | ICD-10-CM

## 2021-05-31 DIAGNOSIS — E1165 Type 2 diabetes mellitus with hyperglycemia: Secondary | ICD-10-CM | POA: Diagnosis not present

## 2021-05-31 LAB — POCT GLYCOSYLATED HEMOGLOBIN (HGB A1C): Hemoglobin A1C: 7 % — AB (ref 4.0–5.6)

## 2021-05-31 MED ORDER — LEVEMIR FLEXTOUCH 100 UNIT/ML ~~LOC~~ SOPN: 30.0000 [IU] | PEN_INJECTOR | Freq: Every day | SUBCUTANEOUS | 1 refills | Status: DC

## 2021-05-31 NOTE — Patient Instructions (Addendum)
Please continue: - Metformin ER 750 mg 2x a day - Farxiga 10 mg in am, before b'fast   - Trulicity 4.5 mg weekly   - Novolog 12-15 units depending on the size of the meal   Please increase: - Levemir 30 units at bedtime  Check some sugars after dinner.  Some diet suggestions: - Do not drink your calories (sodas of any kind, sweet tea, or juice). Substitute with fresh fruit-flavored water. - Eat low glycemic index foods. Avoid highly-processed sugars.  - I suggest a whole food plant-based diet (please see patient instructions).  Next best thing is probably a Mediterranean diet. - Exercise - any level of activity during the day, even walking. - Go to bed early, if you can - Try not to eat when it's dark outside  - Books: Dr. Wylene Simmer - The Obesity Code Mila Merry - Bright Line Eating  Please return in 4 months with your sugar log.

## 2021-05-31 NOTE — Progress Notes (Signed)
Patient ID: Audrey Goodman, female   DOB: 05-17-1971, 51 y.o.   MRN: [id number]   This visit occurred during the SARS-CoV-2 public health emergency.  Safety protocols were in place, including screening questions prior to the visit, additional usage of staff PPE, and extensive cleaning of exam room while observing appropriate contact time as indicated for disinfecting solutions.   HPI: Audrey Goodman is a 51 y.o.-year-old female, returning for f/u for DM2, dx with GDM in 1995, then DM 6 mo after son was born, insulin-dependent since ~2010, uncontrolled, without complications and PCOS. Last visit 7.5 months ago.  Interim history: She was previously seen with weight management clinic at [facility] - nonsurgical weight loss program.  She lost 20 pounds on their diet but could not be done due to price.   No increased urination, blurry vision, nausea, chest pain.  She has neck pain, thinks she may have slept wrong. She just ended a 3-year relationship. She will going to cruise in 01/2022.  Reviewed HbA1c levels: Lab Results  Component Value Date   HGBA1C 7.0 (A) 10/14/2020   HGBA1C 6.8 (A) 06/09/2020   HGBA1C 7.2 (A) 01/14/2020   Currently on: - Metformin ER 750 mg 2x a day - Farxiga 10 mg in am, before b'fast - Levemir 25  (forgot) units at bedtime - Novolog 8-15 units depending on the size of the meal - Trulicity 4.5 mg weekly Of note, she could not tolerate Jardiance 10 (started 01/2018) because of increased urination.  Pt checks her sugars 2-3 times a day: - am: 97, 117-144, 150, 302 >> 110-159 (cruise) >> 106-179, 194 - 2h after b'fast: 103-148 >> n/c >> 120-125 >> 151 >> n/c - before lunch:120 >> 91-136, 165, 187 >> n/c >> 124-151 - 2h after lunch:  150-180 >> n/c >> 150-190 >> n/c - before dinner:  101-122, 171, 182 >> 140-160 >> 129 - 2h after dinner: n/c >> 180 >> 165-194 >> 90-220 >> n/c - bedtime: 109-158, 251 >> 127-204 >> 160-200 (snack) >> n/c  - nighttime: n/c Lowest  sugar was  69 (ate a small dinner) ... >> 91 >> 90 >> 106; she has hypoglycemia awareness in the 80s. Highest sugar was 501 (streroid) >> 310 ...  >> 200 >> 302 >> 194.  + CKD-sees nephrology (Dr. Hollie Salk), last BUN/creatinine:  Lab Results  Component Value Date   BUN 19 11/29/2020   CREATININE 1.03 (H) 11/29/2020  06/16/2019: 14/0.83, glucose 117 08/02/2017: BUN/creatinine 13/0.87, GFR 93, potassium 5.3 (3.5-5.2) She is off lisinopril. She has a history of proteinuria: Component     Latest Ref Rng & Units 03/28/2017  Creatinine, Urine     20 - 275 mg/dL 189  Protein/Creat Ratio     21 - 161 mg/g creat 180 (H)  Total Protein, Urine     5 - 24 mg/dL 34 (H)  Albumin     % 63  Alpha-1-Globulin, U     % 6  ALPHA-2-GLOBULIN, U     % 8  Beta Globulin, U     % 12  Gamma Globulin, U     % 11  Interpretation        Prot,24hr calculated     0 - 149 mg/24 h 480 (H)   + HL; last set of lipids: Lab Results  Component Value Date   CHOL 159 11/29/2020   HDL 51 11/29/2020   LDLCALC 90 11/29/2020   TRIG 99 11/29/2020   CHOLHDL 2.9 03/21/2020  06/16/2019: 143/98/44/82 She had leg cramps with Crestor 10. Started drinking more water >> helped.  - last eye exam was: 11/10/2020: No DR; Dr. Katy Fitch.  - no numbness and tingling in her feet.  Latest TSH: 06/16/2019: 3.279  PCOS: -She had irregular menses since menarche -+1 pregnancies, no miscarriages - was on Mirena IUD - for ~4.5 years >> then on Sprintec since 2016 -She continues on metformin ER 750 mg twice a day and spironolactone 50 mg twice a day -this helps with her hirsutism  Latest potassium level is normal: Lab Results  Component Value Date   K 4.9 11/29/2020  06/16/2019: Potassium 4.6 (3.5-5.3)  ROS: + see HPI  I reviewed pt's medications, allergies, PMH, social hx, family hx, and changes were documented in the history of present illness. Otherwise, unchanged from my initial visit note.  Past Medical History:  Diagnosis  Date   Anxiety    Arthritis    left shoulder   Back pain    Chronic kidney disease    stage 1   Depression    Diabetes mellitus without complication (HCC)    type 2- on meds   Diverticulitis    High cholesterol    History of chicken pox    Hyperlipidemia    on meds   Hypertension    on meds   Joint pain    Lactose intolerance    PCOS (polycystic ovarian syndrome)    Seasonal allergies    Shingles    Sleep apnea    does NOT use CPAP   SVD (spontaneous vaginal delivery) 1995   x 1   Type 2 diabetes mellitus, uncontrolled ([place]) 07/30/2006   Qualifier: Diagnosis of  By: Drue Flirt  MD, Merrily Brittle     Past Surgical History:  Procedure Laterality Date   ANKLE FRACTURE SURGERY  05/28/1989   right   CHOLECYSTECTOMY  05/28/2006   COLONOSCOPY  1989   due to blood in stool and diverticulitis per pt   HYSTEROSCOPY N/A 09/13/2014   Procedure: HYSTEROSCOPY with IUD removal;  Surgeon: Linda Hedges, DO;  Location: [facility];  Service: Gynecology;  Laterality: N/A;  need longest speculum and instruments that aresavailable due to patient's habitus (BMI ~60)   Social History   Socioeconomic History   Marital status: Single    Spouse name: Not on file   Number of children: Not on file   Years of education: Not on file   Highest education level: Not on file  Occupational History   Not on file  Tobacco Use   Smoking status: Never   Smokeless tobacco: Never  Vaping Use   Vaping Use: Never used  Substance and Sexual Activity   Alcohol use: No    Alcohol/week: 0.0 standard drinks   Drug use: No   Sexual activity: Yes    Partners: Male    Birth control/protection: Pill  Other Topics Concern   Not on file  Social History Narrative   Single- lives alone   Son 69- senior at [employer]   Enjoys reading   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     FARXIGA 10 MG TABS tablet TAKE 1/2 TO 1 TABLET BY MOUTH DAILY BEFORE BREAKFAST AS DIRECTED 90 tablet 3   insulin aspart (NOVOLOG FLEXPEN) 100 UNIT/ML FlexPen INJECT 15-25 UNITS (DEPENDING ON MEAL SIZE) UNDER THE SKIN 3 TIMES A DAY 60 mL 3   Insulin Pen Needle 32G X 4 MM MISC Use to inject insulin 4 times daily 400 each 3   LEVEMIR FLEXTOUCH 100 UNIT/ML FlexTouch Pen INJECT 25 UNITS INTO THE SKIN AT BEDTIME 30 mL 1   metFORMIN (GLUCOPHAGE-XR) 750 MG 24 hr tablet TAKE 1 TABLET BY MOUTH TWICE A DAY 180 tablet 3   Multiple Vitamin (MULTIVITAMIN PO) Take 1 tablet by mouth daily at 6 (six) AM.     rosuvastatin (CRESTOR) 10 MG tablet TAKE 1 TABLET BY MOUTH EVERY DAY 90 tablet 1   spironolactone (ALDACTONE) 50 MG tablet TAKE 1 TABLET BY MOUTH TWICE A DAY 99991111 tablet 3   TRULICITY 4.5 0000000 SOPN INJECT CONTENTS OF 1 PEN (4.5MG ) UNDER THE SKIN ONCE WEEKLY 6 mL 3   No current facility-administered medications for this visit.    Allergies  Allergen Reactions   Lisinopril Swelling    Swelling of the left side of tongue   Lipitor [Atorvastatin] Other (See Comments)    Myalgia/foot cramping   Family History  Problem Relation Age of Onset   Obesity Mother    Colon polyps Mother    Cancer Mother        breast   Hyperlipidemia Mother    Heart disease Mother    Hypertension Mother    Mental illness Mother        depression   Diabetes Mother    Alcoholism Father    Hyperlipidemia Father    Stroke Father    Hypertension Father    Cancer Maternal Grandmother        colon    Diabetes Maternal Grandmother    Diabetes Maternal Aunt    Glaucoma Maternal Aunt    Colon polyps Maternal Uncle    Colon cancer Maternal Uncle    Diabetes Maternal Uncle    Diabetes Maternal Uncle    Esophageal cancer Neg Hx    Rectal cancer Neg Hx    Stomach cancer Neg Hx     PE: BP 130/72 (BP Location: Right Arm, Patient Position: Sitting, Cuff Size: Normal)    Pulse 92    Ht 5\' 5"  (1.651 m)    Wt 275 lb (124.7 kg)    SpO2 97%    BMI 45.76 kg/m  Wt Readings from Last 3 Encounters:  05/31/21 275 lb (124.7 kg)  01/23/21 281 lb (127.5 kg)  12/21/20 281 lb (127.5 kg)   Constitutional: overweight, in NAD Eyes: PERRLA, EOMI, no exophthalmos ENT: moist mucous membranes, no thyromegaly, no cervical lymphadenopathy Cardiovascular: RRR, No MRG Respiratory: CTA B Musculoskeletal: no deformities, strength intact in all 4 Skin: moist, warm, no rashes Neurological: no tremor with outstretched hands, DTR normal in all 4  ASSESSMENT: 1. DM2, insulin-dependent, uncontrolled, without complications  2. PCOS Component     Latest Ref Rng 10/16/2013  Testosterone     10 - 70 ng/dL 54  Sex Hormone Binding     18 - 114 nmol/L 12 (L)  Testosterone Free     0.6 - 6.8 pg/mL 15.5 (H)  Testosterone-% Free     0.4 - 2.4 % 2.9 (H)  TSH     0.35 - 4.50 uIU/mL 0.54  17-OH-Progesterone, LC/MS/MS      <8  Free T4     0.60 - 1.60 ng/dL 0.84  T3, Free     2.3 - 4.2 pg/mL 3.0   Labs confirmed PCOS (high testosterone, low 17-HO Prog, normal TFTs).  LH and FSH were not tested as she was on the Mirena IUD.  3. HL  PLAN:  1. DM2 - Patient with longstanding, uncontrolled, type 2 diabetes, complex medication regimen including basal-bolus insulin regimen, metformin, SGLT2 inhibitor, and weekly GLP-1 receptor agonist at maximal dose.  At last visit, HbA1c was slightly higher, at 7.0%.  Sugars were worse, increased during a cruise, but they did not improve even after this.  They were higher than target  in the morning and also before and after dinner.  She was working on improving her diet so we continued the same doses of NovoLog but we did try to increase Levemir dose.  We discussed about the possibility to decrease the dose back after sugars started to improve.  At this visit, she tells me that she forgot to increase the Levemir dose. -At today's visit, sugars are slightly high as she mentions that she relaxed her diet and does not feel like exercising or work on improving her activity or diet.  She knows that she needs to start working on these and I suggested several changes in her diet and also to reference books.  It looks like she does need a support group to be able to work on this.  She also mentions that she missed medications occasionally since last visit.  For now, I do not feel that we need to change her regimen beyond increasing the Levemir to 30 units provisionally, pending the change in activity and diet. -  I suggested to:  Patient Instructions  Please continue: - Metformin ER 750 mg 2x a day - Farxiga 10 mg in am, before b'fast   - Trulicity 4.5 mg weekly   - Novolog 12-15 units depending on the size of the meal   Please increase: - Levemir 30 units at bedtime  Check some sugars after dinner.  Some diet suggestions: - Do not drink your calories (sodas of any kind, sweet tea, or juice). Substitute with fresh fruit-flavored water. - Eat low glycemic index foods. Avoid highly-processed sugars.  - I suggest a whole food plant-based diet (please see patient instructions).  Next best thing is probably a Mediterranean diet. - Exercise - any level of activity during the day, even walking. - Go to bed early, if you can - Try not to eat when it's dark outside  - Books: Dr. Sharman Cheek - The Obesity Code Demetra Shiner - Bright Line Eating  Please return in 4 months with your sugar log.    - we checked her HbA1c: 7.0% (stable) - advised to check sugars at different times  of the day - 4x a day, rotating check times - advised for yearly eye exams >> she is UTD - return to clinic in 4 months  2. PCOS -Patient with clinically evident PCOS, confirmed biochemically in 2015 -Now perimenopausal -She continues on Ortho-Cyclen, tolerated well -She also continues on Trulicity, metformin ER, and spironolactone.  She had a slightly high potassium in 2019 while on a potassium supplement, which  we stopped.  He is also on an SGLT2 inhibitor, which can raise potassium.  However, latest level was normal: Lab Results  Component Value Date   K 4.9 11/29/2020  -She decided against weight loss surgery -She discussed with PCP about starting phentermine, but PCP suggested weight Management Clinic -she did not start yet  3. HL -Reviewed latest lipid panel from 11/2020: All fractions at goal: Lab Results  Component Value Date   CHOL 159 11/29/2020   HDL 51 11/29/2020   LDLCALC 90 11/29/2020   TRIG 99 11/29/2020   CHOLHDL 2.9 03/21/2020  -She continues on Crestor 10 mg daily.  She had muscle cramps in the past but resolved after she increased her water intake.  Philemon Kingdom, MD PhD [facility]

## 2021-06-03 ENCOUNTER — Encounter: Payer: Self-pay | Admitting: Internal Medicine

## 2021-06-05 ENCOUNTER — Other Ambulatory Visit: Payer: Self-pay | Admitting: Internal Medicine

## 2021-06-05 MED ORDER — WEGOVY 2.4 MG/0.75ML ~~LOC~~ SOAJ: 2.4000 mg | SUBCUTANEOUS | 5 refills | Status: DC

## 2021-06-06 ENCOUNTER — Other Ambulatory Visit (HOSPITAL_COMMUNITY): Payer: Self-pay

## 2021-06-06 ENCOUNTER — Telehealth: Payer: Self-pay | Admitting: Pharmacy Technician

## 2021-06-06 NOTE — Telephone Encounter (Signed)
Patient Advocate Encounter   Received notification from CoverMyMeds that prior authorization for Wegovy 2.4mg  is required by his/her insurance [employer].   PA submitted on 06/06/21- through CoverMyMeds Key [id number] Status is pending    [facility] will continue to follow:  Patient Advocate Fax:  [phone]

## 2021-06-06 NOTE — Telephone Encounter (Signed)
Patient Advocate Encounter  Prior Authorization for [facility] 2.4mg  has been approved.     Effective dates: 06/06/21 through 01/04/22  Per Test Claim Patients co-pay is $refill too soon. Already filled today.

## 2021-07-02 ENCOUNTER — Other Ambulatory Visit: Payer: Self-pay | Admitting: Family

## 2021-08-17 ENCOUNTER — Encounter: Payer: Self-pay | Admitting: Family

## 2021-09-15 ENCOUNTER — Other Ambulatory Visit: Payer: Self-pay | Admitting: Family

## 2021-09-25 ENCOUNTER — Encounter: Payer: Self-pay | Admitting: Internal Medicine

## 2021-09-28 ENCOUNTER — Encounter: Payer: Self-pay | Admitting: Internal Medicine

## 2021-09-28 ENCOUNTER — Ambulatory Visit: Payer: 59 | Admitting: Internal Medicine

## 2021-09-28 VITALS — BP 120/82 | HR 81 | Ht 65.0 in | Wt 270.6 lb

## 2021-09-28 DIAGNOSIS — E282 Polycystic ovarian syndrome: Secondary | ICD-10-CM

## 2021-09-28 DIAGNOSIS — E1165 Type 2 diabetes mellitus with hyperglycemia: Secondary | ICD-10-CM

## 2021-09-28 DIAGNOSIS — E785 Hyperlipidemia, unspecified: Secondary | ICD-10-CM

## 2021-09-28 LAB — POCT GLYCOSYLATED HEMOGLOBIN (HGB A1C): Hemoglobin A1C: 6.3 % — AB (ref 4.0–5.6)

## 2021-09-28 MED ORDER — NOVOLOG FLEXPEN 100 UNIT/ML ~~LOC~~ SOPN: PEN_INJECTOR | SUBCUTANEOUS | 3 refills | Status: DC

## 2021-09-28 MED ORDER — DAPAGLIFLOZIN PROPANEDIOL 10 MG PO TABS
ORAL_TABLET | ORAL | 3 refills | Status: DC
Start: 2021-09-28 — End: 2022-10-29

## 2021-09-28 MED ORDER — METFORMIN HCL ER 750 MG PO TB24: 750.0000 mg | ORAL_TABLET | Freq: Two times a day (BID) | ORAL | 3 refills | Status: DC

## 2021-09-28 MED ORDER — LEVEMIR FLEXTOUCH 100 UNIT/ML ~~LOC~~ SOPN: 30.0000 [IU] | PEN_INJECTOR | Freq: Every day | SUBCUTANEOUS | 3 refills | Status: DC

## 2021-09-28 NOTE — Patient Instructions (Addendum)
Please change: ?- Metformin ER 1500 mg with dinner ? ?Continue: ?- Farxiga 10 mg in am, before b'fast ?  - Wegovy 2.4 mg weekly ?  - Novolog 12-15 units depending on the size of the meal ?- Levemir 30 units at bedtime (increase to 34-36 units if sugars do not improve after moving Metformin) ? ?Try to avoid snacking after dinner.  ? ?Some diet suggestions: ?- Do not drink your calories (sodas of any kind, sweet tea, or juice). Substitute with fresh fruit-flavored water. ?- Eat low glycemic index foods. Avoid highly-processed sugars.  ?- I suggest a whole food plant-based diet (please see patient instructions).  Next best thing is probably a Mediterranean diet. ?- Exercise - any level of activity during the day, even walking. ?- Go to bed early, if you can ?- Try not to eat when it's dark outside ? ?- Books: ?Dr. Sharman Cheek - The Obesity Code ?[place] ? ?Please return in 4 months with your sugar log. ?

## 2021-09-28 NOTE — Progress Notes (Signed)
Patient ID: Audrey Goodman, female   DOB: April 21, 1971, 51 y.o.   MRN: [id number]  ? ?This visit occurred during the SARS-CoV-2 public health emergency.  Safety protocols were in place, including screening questions prior to the visit, additional usage of staff PPE, and extensive cleaning of exam room while observing appropriate contact time as indicated for disinfecting solutions.  ? ?HPI: ?Audrey Goodman is a 51 y.o.-year-old female, returning for f/u for DM2, dx with GDM in 1995, then DM 6 mo after son was born, insulin-dependent since ~2010, uncontrolled, without complications and PCOS. Last visit 4 months ago. ? ?Interim history: ?She was previously seen with weight management clinic at [facility] - nonsurgical weight loss program.  She lost 20 pounds on their diet but could not be continue due to price.   ?Since last visit, we were able to start [facility].  She tolerates it well.  She does feel that her appetite is decreased on it. ?No increased urination, blurry vision, nausea, chest pain.  ? ?Reviewed HbA1c levels: ?05/31/2021: HbA1c 7.0% ?Lab Results  ?Component Value Date  ? HGBA1C 7.0 (A) 10/14/2020  ? HGBA1C 6.8 (A) 06/09/2020  ? HGBA1C 7.2 (A) 01/14/2020  ? ?Currently on: ?- Metformin ER 750 mg 2x a day ?- Farxiga 10 mg in am, before b'fast ?- Levemir 25 >> 30 units at bedtime ?- Novolog 8-15 units depending on the size of the meal ?- Trulicity 4.5 mg weekly >> Wegovy 2.4 mg weekly (started 05/2021) ?Of note, she could not tolerate Jardiance 10 (started 01/2018) because of increased urination. ? ?Pt checks her sugars 2-3 times a day: ?- am: 97, 117-144, 150, 302 >> 110-159 (cruise) >> 106-179, 194 >> 119-187 ?- 2h after b'fast: 103-148 >> n/c >> 120-125 >> 151 >> n/c >> 124-167, 187 ?- before lunch:120 >> 91-136, 165, 187 >> n/c >> 124-151 >> 86-148, 191 ?- 2h after lunch:  150-180 >> n/c >> 150-190 >> n/c>> 96-177, 200 ?- before dinner:  101-122, 171, 182 >> 140-160 >> 129 >> 67-177 ?- 2h after dinner: n/c  >> 180 >> 165-194 >> 90-220 >> n/c >> 89-189, 205 ?- bedtime: 109-158, 251 >> 127-204 >> 160-200 (snack) >> n/c >> 106-154 ?- nighttime: n/c ?Lowest sugar was  69 (ate a small dinner) ... >> 90 >> 106 >> 54; she has hypoglycemia awareness in the 80s. ?Highest sugar was 501 (streroid) >> ...302 >> 194 >> 288. ? ?+ CKD with proteinuria-sees nephrology (Dr. Hollie Salk), last BUN/creatinine:  ?09/07/2021: 17/0.84, GFR 85, protein to creatinine ratio 83 (0-200), glucose 123 ?Lab Results  ?Component Value Date  ? BUN 19 11/29/2020  ? CREATININE 1.03 (H) 11/29/2020  ?She is off lisinopril. ? ?+ HL; last set of lipids: ?Lab Results  ?Component Value Date  ? CHOL 159 11/29/2020  ? HDL 51 11/29/2020  ? [place] 90 11/29/2020  ? TRIG 99 11/29/2020  ? CHOLHDL 2.9 03/21/2020  ?06/16/2019: 143/98/44/82 ?She had leg cramps with Crestor 10. Started drinking more water >> helped. ? ?- last eye exam was: 11/10/2020: No DR; Dr. Katy Fitch. ? ?- no numbness and tingling in her feet. ? ?Latest TSH: 06/16/2019: 3.279 ? ?PCOS: ?-She had irregular menses since menarche ?-+1 pregnancies, no miscarriages ?- was on Mirena IUD - for ~4.5 years >> then on Sprintec since 2016 >> then Orthocycen >> now Nexplanon since 2020 ?-She continues on metformin ER 750 mg twice a day and spironolactone 50 mg twice a day -this helps with her hirsutism ? ?Latest  potassium level is normal: ?Lab Results  ?Component Value Date  ? K 4.9 11/29/2020  ?06/16/2019: Potassium 4.6 (3.5-5.3) ? ?ROS: ?+ see HPI ? ?I reviewed pt's medications, allergies, PMH, social hx, family hx, and changes were documented in the history of present illness. Otherwise, unchanged from my initial visit note. ? ?Past Medical History:  ?Diagnosis Date  ? Anxiety   ? Arthritis   ? left shoulder  ? Back pain   ? Chronic kidney disease   ? stage 1  ? Depression   ? Diabetes mellitus without complication ([place])   ? type 2- on meds  ? Diverticulitis   ? High cholesterol   ? History of chicken pox   ?  Hyperlipidemia   ? on meds  ? Hypertension   ? on meds  ? Joint pain   ? Lactose intolerance   ? PCOS (polycystic ovarian syndrome)   ? Seasonal allergies   ? Shingles   ? Sleep apnea   ? does NOT use CPAP  ? SVD (spontaneous vaginal delivery) 1995  ? x 1  ? Type 2 diabetes mellitus, uncontrolled 07/30/2006  ? Qualifier: Diagnosis of  By: Drue Flirt  MD, Merrily Brittle    ? ?Past Surgical History:  ?Procedure Laterality Date  ? ANKLE FRACTURE SURGERY  05/28/1989  ? right  ? CHOLECYSTECTOMY  05/28/2006  ? COLONOSCOPY  1989  ? due to blood in stool and diverticulitis per pt  ? HYSTEROSCOPY N/A 09/13/2014  ? Procedure: HYSTEROSCOPY with IUD removal;  Surgeon: Linda Hedges, DO;  Location: [facility];  Service: Gynecology;  Laterality: N/A;  need longest speculum and instruments that aresavailable due to patient's habitus (BMI ~60)  ? ?Social History  ? ?Socioeconomic History  ? Marital status: Single  ?  Spouse name: Not on file  ? Number of children: Not on file  ? Years of education: Not on file  ? Highest education level: Not on file  ?Occupational History  ? Not on file  ?Tobacco Use  ? Smoking status: Never  ? Smokeless tobacco: Never  ?Vaping Use  ? Vaping Use: Never used  ?Substance and Sexual Activity  ? Alcohol use: No  ?  Alcohol/week: 0.0 standard drinks  ? Drug use: No  ? Sexual activity: Yes  ?  Partners: Male  ?  Birth control/protection: Pill  ?Other Topics Concern  ? Not on file  ?Social History Narrative  ? Single- lives alone  ? Son 62- senior at [employer]  ? Enjoys reading  ? Works as a Marketing executive at [employer]   ? ?Social Determinants of Health  ? ?Financial Resource Strain: Not on file  ?Food Insecurity: Not on file  ?Transportation Needs: Not on file  ?Physical Activity: Not on file  ?Stress: Not on file  ?Social Connections: Not on file  ?Intimate Partner Violence: Not on file  ? ?Current Outpatient Medications  ?Medication Sig Dispense Refill  ? amLODipine (NORVASC) 5 MG tablet TAKE 1 TABLET BY MOUTH EVERY  DAY 90 tablet 0  ? cetirizine (ZYRTEC) 10 MG tablet Take 10 mg by mouth daily.    ? FARXIGA 10 MG TABS tablet TAKE 1/2 TO 1 TABLET BY MOUTH DAILY BEFORE BREAKFAST AS DIRECTED 90 tablet 3  ? FLUoxetine (PROZAC) 20 MG capsule TAKE 1 CAPSULE BY MOUTH EVERY DAY 90 capsule 1  ? insulin aspart (NOVOLOG FLEXPEN) 100 UNIT/ML FlexPen INJECT 15-25 UNITS (DEPENDING ON MEAL SIZE) UNDER THE SKIN 3 TIMES A DAY 60 mL 3  ?  insulin detemir (LEVEMIR FLEXTOUCH) 100 UNIT/ML FlexPen Inject 30 Units into the skin at bedtime. 30 mL 1  ? Insulin Pen Needle 32G X 4 MM MISC Use to inject insulin 4 times daily 400 each 3  ? metFORMIN (GLUCOPHAGE-XR) 750 MG 24 hr tablet TAKE 1 TABLET BY MOUTH TWICE A DAY 180 tablet 3  ? Multiple Vitamin (MULTIVITAMIN PO) Take 1 tablet by mouth daily at 6 (six) AM.    ? rosuvastatin (CRESTOR) 10 MG tablet TAKE 1 TABLET BY MOUTH EVERY DAY 90 tablet 1  ? Semaglutide-Weight Management (WEGOVY) 2.4 MG/0.75ML SOAJ Inject 2.4 mg into the skin once a week. 3 mL 5  ? spironolactone (ALDACTONE) 50 MG tablet TAKE 1 TABLET BY MOUTH TWICE A DAY 180 tablet 3  ? TRULICITY 4.5 0000000 SOPN INJECT CONTENTS OF 1 PEN (4.5MG ) UNDER THE SKIN ONCE WEEKLY 6 mL 3  ? ?No current facility-administered medications for this visit.  ? ? ?Allergies  ?Allergen Reactions  ? Lisinopril Swelling  ?  Swelling of the left side of tongue  ? Lipitor [Atorvastatin] Other (See Comments)  ?  Myalgia/foot cramping  ? ?Family History  ?Problem Relation Age of Onset  ? Obesity Mother   ? Colon polyps Mother   ? Cancer Mother   ?     breast  ? Hyperlipidemia Mother   ? Heart disease Mother   ? Hypertension Mother   ? Mental illness Mother   ?     depression  ? Diabetes Mother   ? Alcoholism Father   ? Hyperlipidemia Father   ? Stroke Father   ? Hypertension Father   ? Cancer Maternal Grandmother   ?     colon  ? Diabetes Maternal Grandmother   ? Diabetes Maternal Aunt   ? Glaucoma Maternal Aunt   ? Colon polyps Maternal Uncle   ? Colon cancer Maternal  Uncle   ? Diabetes Maternal Uncle   ? Diabetes Maternal Uncle   ? Esophageal cancer Neg Hx   ? Rectal cancer Neg Hx   ? Stomach cancer Neg Hx   ? ? ?PE: ?There were no vitals taken for this visit. ?Wt Readings fr

## 2021-09-30 ENCOUNTER — Other Ambulatory Visit: Payer: Self-pay | Admitting: Family

## 2021-11-12 ENCOUNTER — Other Ambulatory Visit: Payer: Self-pay | Admitting: Internal Medicine

## 2021-11-13 ENCOUNTER — Other Ambulatory Visit: Payer: Self-pay | Admitting: Family

## 2021-11-16 LAB — HM DIABETES EYE EXAM

## 2021-12-05 LAB — HM MAMMOGRAPHY

## 2021-12-20 ENCOUNTER — Encounter (INDEPENDENT_AMBULATORY_CARE_PROVIDER_SITE_OTHER): Payer: 59 | Admitting: Family

## 2021-12-20 DIAGNOSIS — R41840 Attention and concentration deficit: Secondary | ICD-10-CM

## 2021-12-21 NOTE — Telephone Encounter (Signed)

## 2022-01-02 ENCOUNTER — Other Ambulatory Visit: Payer: Self-pay | Admitting: Family

## 2022-01-03 ENCOUNTER — Encounter (INDEPENDENT_AMBULATORY_CARE_PROVIDER_SITE_OTHER): Payer: Self-pay

## 2022-01-17 ENCOUNTER — Other Ambulatory Visit (HOSPITAL_COMMUNITY): Payer: Self-pay

## 2022-01-17 ENCOUNTER — Telehealth: Payer: Self-pay

## 2022-01-17 NOTE — Telephone Encounter (Signed)
Patient Advocate Encounter   Received notification from [facility] that prior authorization is required for Wegovy 2.4 MG  Key [id number]  Clinical question returns with this request for documentation to be attached:   Checking with office to confirm which supporting documentation is to be used for this submission.  Burnell Blanks, CPhT Rx Patient Advocate Specialist Phone: [phone]

## 2022-01-18 ENCOUNTER — Other Ambulatory Visit (HOSPITAL_COMMUNITY): Payer: Self-pay

## 2022-01-18 NOTE — Telephone Encounter (Signed)
Patient Advocate Encounter  [phone] prior authorization was submitted and DENIED on 01/18/2022  Authorization has been DENIED due to lack of therapeutic benefit. This plan asked to verify that 5% of baseline body weight was lost once a stable maintenance dose (3 months of therapy) is achieved.   Based on a start date of 06/05/2021, using charted weight from 05/31/2021 of 275 lbs, this patient would need to show 14 lbs of weight loss to meet/exceed the 5% criteria. Charted weight on 09/28/2021 of 270 lbs indicates <2% of baseline body weight was lost while using this medication.  There are no more recent vitals to use in support of this request, however if patient has had success after 09/2021 bringing total weight to or under 261 lbs, we can update the patient chart and try to appeal this decision or resubmit for authorization.  Determination letter added to patient chart.  Burnell Blanks, CPhT Rx Patient Advocate Specialist Phone: [phone]

## 2022-01-19 NOTE — Telephone Encounter (Signed)
Pt contacted and advised of determination.

## 2022-02-02 ENCOUNTER — Ambulatory Visit: Payer: 59 | Admitting: Psychology

## 2022-02-02 DIAGNOSIS — F89 Unspecified disorder of psychological development: Secondary | ICD-10-CM | POA: Diagnosis not present

## 2022-02-02 NOTE — Progress Notes (Addendum)
Date: 02/02/2022 Appointment Start Time: 8am Duration: 90 minutes Provider: Helmut Muster, PsyD Type of Session: Initial Appointment for Evaluation  Location of Patient: Home Location of Provider: Provider's Home (private office) Type of Contact: WebEx video visit with audio  Session Content:  Prior to proceeding with today's appointment, two pieces of identifying information were obtained from Audrey Goodman to verify identity. In addition, Audrey Goodman's physical location at the time of this appointment was obtained. In the event of technical difficulties, Audrey Goodman shared a phone number she could be reached at. Audrey Goodman and this provider participated in today's telepsychological service. Audrey Goodman denied anyone else being present in the room or on the virtual appointment.  The provider's role was explained to Audrey Goodman. The provider reviewed and discussed issues of confidentiality, privacy, and limits therein (e.g., reporting obligations). In addition to verbal informed consent, written informed consent for psychological services was obtained from Audrey Goodman prior to the initial appointment. Written consent included information concerning the practice, financial arrangements, and confidentiality and patients' rights. Since the clinic is not a 24/7 crisis center, mental health emergency resources were shared, and the provider explained e-mail, voicemail, and/or other messaging systems should be utilized only for non-emergency reasons. This provider also explained that information obtained during appointments will be placed in their electronic medical record in a confidential manner. Audrey Goodman verbally acknowledged understanding of the aforementioned and agreed to use mental health emergency resources discussed if needed. Moreover, Audrey Goodman agreed information may be shared with other [facility] or their referring provider(s) as needed for coordination of care. By signing the new patient documents, Audrey Goodman provided written consent for coordination of care.  Audrey Goodman verbally acknowledged understanding she is ultimately responsible for understanding her insurance benefits as it relates to reimbursement of telepsychological and in-person services. This provider also reviewed confidentiality, as it relates to telepsychological services, as well as the rationale for telepsychological services. This provider further explained that video should not be captured, photos should not be taken, nor should testing stimuli be copied or recorded as it would be a copyright violation. Audrey Goodman expressed understanding of the aforementioned, and verbally consented to proceed.  Audrey Goodman completed the psychiatric diagnostic evaluation (history, including past, family, social, and  psychiatric history; behavioral observations; and establishment of a provisional diagnosis). The evaluation was completed in 90 minutes. Code 81275 was billed.   Mental Status Examination:  Appearance:  neat Behavior: appropriate to circumstances Mood: neutral Affect: mood congruent Speech: WNL Eye Contact: appropriate Psychomotor Activity: restless Thought Process: linear, logical, and goal directed Content/Perceptual Disturbances: none Orientation: AAOx4 Cognition/Sensorium: intact Insight: good Judgment: good  Provisional DSM-5 diagnosis(es):  F89 Unspecified Disorder of Psychological Development   Plan: Audrey Goodman is currently scheduled for an appointment on 02/08/2022 at 4pm via WebEx video visit with audio.       CONFIDENTIAL PSYCHOLOGICAL EVALUATION ______________________________________________________________________________  Name: Audrey Goodman   Date of Birth: 04-22-1971    Age: 51 Dates of Evaluation: 02/02/2022  SOURCE AND REASON FOR REFERRAL: Ms. Audrey Goodman was referred by Audrey Goodman for an evaluation to ascertain if she meets criteria for Attention Deficit/Hyperactivity Disorder (ADHD).    BACKGROUND INFORMATION AND PRESENTING PROBLEM: Audrey Goodman is a 51 year old  female who resides in [place]  Audrey Goodman reported her son was diagnosed with ADHD which led to her "wondering if ADHD may be explaining some of the things [she] experiences." She described her ADHD-related concerns as having occurred since childhood and include her "brain going from one thing to another;" alternating between  being easily distracted by various stimuli (e.g., tasks, thoughts, and noises) and hyperfocusing on things she is "interested in" (e.g., reading, "scrolling" on her phone, and learning) to the detriment of higher priority tasks (e.g., sleep which occasionally has caused her to have to call out of work); interrupting others due to urges to share something she finds relevant and/or worries she will forget what she wants to say; task initiation (e.g., putting things off until "last minute" or it becomes urgent to complete as well as thinking of everything that needs to be done which leads to feelings of being overwhelmed and then not starting any of the tasks), maintenance (e.g., becoming distracted by others tasks which leads to starting multiple things and difficulty finishing any of them), and disengagement (e.g., desire to finish tasks she finds "interesting" which can lead to her being late to various events) problems; trouble listening to others when they are speaking directly which she indicated often occurs to difficulties filtering out irrelevant sounds (e.g., other's conversations); becoming annoyed in loud environments as it increases distractibility, when she has to wait without something to do (e.g., using her phone or reading), multiple people talk to her at one time, and when her attention is pulled from a task which she attributed to the effort required to get her attention back on task and the possibility she may forget it; problems starting and maintaining organization systems as she habitually sits items where convenient versus in designated places; trouble  sticking to plans and commitments if she does not write them down as she may forget them and/or overschedule herself; commonly fidgets (e.g., knee bouncing) if she is required to stay seated without a distraction she is interested in; consistently feeling she must be moving or doing something and/or leaving her seat when she is expected to stay seated (e.g., getting up during class to look at books on a bookcase); regular forgetfulness (e.g., needed items, misplacing items, and what she was saying); excessive talking as "thoughts spark other things she wants to say" which can lead to others commenting on it and/or appearing to be no longer listening; habitually driving at least five miles per hour over the speed limit; impulsive decision making (e.g., gambling with money needed for bills, unnecessary purchases, and starting tasks without having read or understood the instructions when younger which contributed to mistakes being made); difficulty following proper order or sequence of tasks without re-reading each step multiple times; and issues adjusting to changes to routine. She also described experiencing generalized anxiety "in more recent years" that she ascribed to hormonal changes from menopause and feeling isolated; sleep onset and maintenance issues that she attributed to her "mind thinking about a variety of things," "hot flashes" from menopause, tossing and turning in her sleep, and hearing a noise that wakes her; and periods of low mood that last "a day or two." She expressed a belief her ADHD-related concerns are consistent and independent of mood, although noted how concentrating, filtering her words, masking her emotional experiences, and "getting going" take significant effort and cause fatigue as well as how low mood and anxiety exacerbate "ADHD and vice versa." She stated her coping strategies include using timers, taking notes for information she wants to remember, making lists, having designated  place for commonly needed and important items, having others repeat themselves, making it harder for herself to purchase items (e.g., deleting shopping apps off her phone), and diaphragmatic breathing for sleep onset.  Ms. Vanderhoof reported throughout schooling she frequently "waited  until last minute" to complete assignments before "rolling with it and getting it done," putting in significantly varying effort on assignments depending upon her interest level in the subject, and her report cards regularly noting she "does not work to potential," "talks a lot" and/or "will not be quiet." She described her grades as "good" in college and that she graduated with bachelor's degrees in forensic biology and applied geography, noting she generally enjoyed the subjects. She denied ever experiencing grade retention or having been diagnosed with a learning disability. 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 She attributed these issues to difficulty adjusting to her son leaving for college and that initiating use of psychotropic medication at this time helped.   Ms. Wasik described her medical history as significant for diabetes, stage one chronic kidney disease, high blood pressure, and high cholesterol, sharing she is managing the conditions with medication but is having difficulty starting and maintaining dietary changes and exercise routines. She shared her familial mental health history is significant for depression and "being distant and in her own world (mother) and ADHD (son and various cousins, nieces, and nephews). She noted past use of mental health services "right before COVID-19" for "depression that was getting worse" after her uncle passed. She  shared she utilizes alcohol approximately 6-7 times a year "socially" and her caffeine use includes a 12oz of iced coffee and a 16oz diet soda a day. She denied ever experiencing psychiatric hospitalization; obsessions and compulsions; hypomanic or manic episode; eating disorder; experiencing trauma or a disturbing event that resulted in trauma- and stressor-related disorder symptomatology; psychosis; sleep apnea-related concerns (e.g., stopping breathing, loud snoring, or gasping for air in her sleep); legal involvement; or suicidal or homicidal ideation, plan, or intent.    Dolores Lory, PsyD

## 2022-02-06 ENCOUNTER — Ambulatory Visit: Payer: 59 | Admitting: Internal Medicine

## 2022-02-08 ENCOUNTER — Ambulatory Visit: Payer: 59 | Admitting: Psychology

## 2022-02-08 DIAGNOSIS — F89 Unspecified disorder of psychological development: Secondary | ICD-10-CM

## 2022-02-08 NOTE — Progress Notes (Signed)
Date: 02/08/2022   Appointment Start Time: 4pm Duration: 60 minutes Provider: Helmut Muster, PsyD Type of Session: Testing Appointment for Evaluation  Location of Patient: Home Location of Provider: Provider's Home (private office) Type of Contact: WebEx video visit with audio  Session Content: Today's appointment was a telepsychological visit due to COVID-19. Audrey Goodman is aware it is her responsibility to secure confidentiality on her end of the session. Prior to proceeding with today's appointment, Audrey Goodman's physical location at the time of this appointment was obtained as well a phone number she could be reached at in the event of technical difficulties. Audrey Goodman denied anyone else being present in the room or on the virtual appointment. This provider reviewed that video should not be captured, photos should not be taken, nor should testing stimuli be copied or recorded as it would be a copyright violation. Audrey Goodman expressed understanding of the aforementioned, and verbally consented to proceed. The WAIS-IV was administered, scored, and interpreted by this evaluator  Billing codes will be input on the feedback appointment. There are no billing codes for the testing appointment.   Provisional DSM-5 diagnosis(es):  F89 Unspecified Disorder of Psychological Development   Plan: Audrey Goodman was scheduled for a feedback appointment on 02/22/2022 at 2pm via WebEx video visit with audio.                Margarite Gouge, PsyD

## 2022-02-22 ENCOUNTER — Ambulatory Visit: Payer: 59 | Admitting: Psychology

## 2022-02-22 DIAGNOSIS — F902 Attention-deficit hyperactivity disorder, combined type: Secondary | ICD-10-CM | POA: Diagnosis not present

## 2022-02-22 NOTE — Progress Notes (Signed)
Testing and Report Writing Information: The following measures  were administered, scored, and interpreted by this provider:  Generalized Anxiety Disorder-7 (GAD-7; 5 minutes), Patient Health Questionnaire-9 (PHQ-9; 5 minutes), Wechsler Adult Intelligence Scale-Fourth Edition (WAIS-IV; 60 minutes), CNS Vital Signs (45 minutes), Adult Attention Deficit/Hyperactivity Disorder Self-Report Scale Checklist (ASRSv1.1; 15 minutes), Behavior Rating Inventory for Executive Function - A - Self Report (BRIEF A; 10 minutes) and Behavior Rating Inventory for Executive Function - A - Informant (BRIEF-A; 10 minutes) , Personality Assessment Inventory (PAI; 50 minutes). A total of 200 minutes was spent on the administration and scoring of the aforementioned measures. Codes [phone] and [phone] (5 units) were billed.  Please see the assessment for additional details. This provider completed the written report which includes integration of patient data, interpretation of standardized test results, interpretation of clinical data, review of information provided by [facility] and any collateral information/documentation, and clinical decision making (220 minutes in total).  Feedback Appointment: Date: 02/22/2022 Appointment Start Time: 2:10pm Duration: 45 minutes Provider: Clarice Pole, PsyD Type of Session: Feedback Appointment for Evaluation  Location of Patient: Home Location of Provider: Provider's Home (private office) Type of Contact: Webex video visit with audio  Session Content: Today's appointment was a telepsychological visit due to COVID-19. Erial is aware it is her responsibility to secure confidentiality on her end of the session. She provided verbal consent to proceed with today's appointment. Prior to proceeding with today's appointment, Tracy's physical location at the time of this appointment was obtained as well a phone number she could be reached at in the event of technical difficulties. Davita denied anyone else  being present in the room or on the virtual appointment.  This provider and Alden Benjamin completed the interactive feedback session which includes reviewing the following measures: Generalized Anxiety Disorder-7 (GAD-7), Patient Health Questionnaire-9 (PHQ-9), Wechsler Adult Intelligence Scale-Fourth Edition (WAIS-IV), CNS Vital Signs, Adult Attention Deficit/Hyperactivity Disorder Self-Report Scale Checklist (ASRSv1.1), Behavior Rating Inventory for Executive Function - A - Self Report (Brief- A), Personality Assessment Inventory (PAI). During this interactive feedback session, results of the aforementioned measures, treatment recommendations, and diagnostic conclusions were discussed.   The interactive feedback session was completed today and a total of 45 minutes was spent on feedback. Code [phone] was billed for feedback session.   DSM-5 Diagnosis(es):  F90.2 Attention-Deficit/Hyperactivity Disorder, Combined Presentation, Moderate  Time Requirements: Assessment scoring and interpreting: 200 minutes (billing code [phone] and [phone] [5 units]) Feedback: 45 minutes (billing code [phone]) Report writing: 220 total minutes. 02/02/2022: 4:25-5:45pm. 02/03/2022: 6:25-6:40pm. 02/04/2022: 12:55-1pm. 02/05/2022: 10:15-10:20pm. 02/06/2022: 7:55-8:40am. 02/08/2022: 5:05-5:35pm. 02/10/2022: 7:10-7:50pm.  (billing code [phone] [4])  Plan: Alden Benjamin provided verbal consent for her evaluation to be sent via e-mail. No further follow-up planned by this provider.       CONFIDENTIAL PSYCHOLOGICAL EVALUATION ______________________________________________________________________________  Name: Audrey Goodman   Date of Birth: [birth year]    Age: 51 Dates of Evaluation: 02/02/2022, 02/04/2022, 02/05/2022, and 02/08/2022  SOURCE AND REASON FOR REFERRAL: Ms. Mekia Dipinto was referred by NP [name] for an evaluation to ascertain if she meets criteria for Attention Deficit/Hyperactivity Disorder (ADHD).   EVALUATIVE PROCEDURES: Clinical  Interview with Ms. Ka Bench (02/02/2022) Wechsler Adult Intelligence Scale-Fourth Edition (WAIS-IV; 02/08/2022) CNS Vital Signs (02/05/2022) Adult Attention Deficit/Hyperactivity Disorder Self-Report Scale Checklist (02/05/2022) Behavior Rating Inventory for Executive Function - A - Self Report Behavior Rating Inventory for Executive Function - A - Self Report (BRIEF-; 02/04/2022) and Informant (02/02/2022) Personality Assessment Inventory (PAI; 02/05/2022) Patient Health Questionnaire-9 (PHQ-9) Generalized Anxiety Disorder-7 (GAD-7)  BACKGROUND INFORMATION AND PRESENTING PROBLEM: Ms. Marlo Arriola is a 51 year old female who resides in [place]  Ms. Mijangos reported her son was diagnosed with ADHD which led to her "wondering if ADHD may be explaining some of the things [she] experiences." She described her ADHD-related concerns as having occurred since childhood and include her "brain going from one thing to another;" alternating between being easily distracted by various stimuli (e.g., tasks, thoughts, and noises) and hyperfocusing on things she is "interested in" (e.g., reading, "scrolling" on her phone, and learning) to the detriment of higher priority tasks (e.g., sleep which occasionally has caused her to have to "call out of work" the following day); interrupting others due to urges to share something she finds relevant and/or worries she will forget what she wants to say; task initiation (e.g., putting things off until "last minute" or if it becomes urgent to complete as well as thinking of everything that needs to be done which leads to feelings of being overwhelmed and then not starting any of the tasks), maintenance (e.g., becoming distracted by others tasks which leads to starting multiple things and difficulty finishing any of them), and disengagement (e.g., desire to finish tasks she finds "interesting" which can lead to her being late to various events) problems; trouble listening to  others even when they are speaking directly to her which she indicated often occurs due to difficulties filtering out irrelevant sounds (e.g., other's conversations); becoming annoyed in loud environments as it increases distractibility, when she has to wait without something to do (e.g., using her phone or reading), multiple people talk to her at one time, and when her attention is pulled from a task given the effort required to get her attention back onto or the possibility she may forget the task; problems starting and maintaining organization systems as she habitually sits items where convenient versus in designated places; trouble sticking to plans and commitments if she does not write them down as she may forget them and/or overschedule herself; commonly fidgets (e.g., knee bouncing) if she is required to stay seated without a distraction she is interested in; consistently feeling she must be moving or doing something and/or leaving her seat when she is expected to stay seated (e.g., getting up during class to look at books on a bookcase); regular forgetfulness (e.g., needed items, misplacing items, and what she was saying); excessive talking as "thoughts spark other things she wants to say" which can lead to others commenting on it and/or appearing to be no longer listening; habitually driving at least five miles per hour over the speed limit; impulsive decision making (e.g., gambling with money needed for bills, unnecessary purchases, and starting tasks without having read or understood the instructions when younger which contributed to mistakes being made); difficulty following proper order or sequence of tasks without re-reading steps multiple times; and issues adjusting to changes to routine. She also described experiencing generalized anxiety "in more recent years" that she ascribed to hormonal changes from menopause and feeling isolated; sleep onset and maintenance issues that she attributed to her  "mind thinking about a variety of things," "hot flashes" from menopause, tossing and turning in her sleep, and hearing noises that wake her; and periods of low mood that last "a day or two." She expressed a belief her ADHD-related concerns are consistent and independent of mood, although noted how concentrating, "filtering her words," masking her emotional experiences, and "getting going" take significant effort and cause fatigue as well as how low  mood and anxiety exacerbate "ADHD and vice versa." She stated her coping strategies include using timers, taking notes for information she wants to remember, making lists, having designated place for commonly needed and important items, having others repeat themselves, making it harder for herself to purchase items (e.g., deleting shopping apps off her phone), and diaphragmatic breathing for sleep onset.  Ms. Lievanos reported throughout schooling she frequently "waited until last minute" to complete assignments before "rolling with it and getting it done;" putting in significantly varying effort on assignments depending upon her interest level in the subject; and her report cards regularly noting she "does not work to potential," "talks a lot" and/or "will not be quiet." She described her grades as "good" in college and that she graduated with a bachelor's degree in forensic biology and applied geography, noting she generally enjoyed the subjects. She denied ever experiencing grade retention or having been diagnosed with a learning disability. 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 She attributed these issues to difficulty adjusting to her son leaving for college and shared that initiating the use of psychotropic  medication at this time was helpful.   Ms. Mcclenahan described her medical history as significant for diabetes, stage one chronic kidney disease, high blood pressure, and high cholesterol, noting she is managing the conditions with medication but is having difficulty starting and maintaining dietary changes and exercise routines. She shared her familial mental health history is significant for depression and "being distant and in her own world (mother) and ADHD (her son and various cousins, nieces, and nephews). She noted past use of mental health services "right before COVID-19" for "depression that was getting worse" after her uncle passed. She shared she utilizes alcohol approximately 6-7 times a year "socially" and her caffeine use includes a 12oz of iced coffee as well as a 16oz diet soda a day. She denied ever experiencing psychiatric hospitalization; obsessions and compulsions; hypomanic or manic episode; eating disorder; experiencing trauma or a disturbing event that resulted in trauma- and stressor-related disorder symptomatology; psychosis; sleep apnea-related concerns (e.g., stopping breathing, loud snoring, or gasping for air in her sleep); legal involvement; or suicidal or homicidal ideation, plan, or intent.   BEHAVIORAL OBSERVATIONS: Ms. Shumard presented on time for the evaluation. She was well-groomed. She was oriented to time, place, person, and purpose of the appointment. During the evaluation Ms. Ligman occasionally endorsed and/or demonstrated self-doubt (e.g., stating answers with a questioning tone and describing her brain as "dusty" secondary to concerns she may not be accurately solving arithmetic questions), working memory-related issues (e.g., stating "I think I lost those [numbers]"), and long-term memory retrieval problems (e.g., stating "Brain drain. I cannot think of [the answer] right now" and providing a correct answer to an Information subtest a couple minutes after the WAIS-IV  administration had ended). Her thought processes and content were logical, coherent, and goal-directed. There were no overt signs of a thought disorder or perceptual disturbances, nor did she report such symptomatology. There was no evidence of paraphasias (i.e., errors in speech, gross mispronunciations, and word substitutions), repetition deficits, or disturbances in volume or prosody (i.e., rhythm and intonation). Overall, based on Ms. Stockman's approach to testing, the current results are believed to be a good estimate of her  abilities.  PROCEDURAL CONSIDERATIONS:  Psychological testing measures were conducted through a virtual visit with video and audio capabilities, but otherwise in a standard manner.   The Wechsler Adult Intelligence Scale, Fourth Edition (WAIS-IV) was administered via remote telepractice using digital stimulus materials on [employer]'s Q-global system. The remote testing environment appeared free of distractions, adequate rapport was established with the examinee via video/audio capabilities, and Ms. Glore appeared appropriately engaged in the task throughout the session. No significant technological problems or distractions were noted during administration. Modifications to the standardization procedure included: none. The WAIS-IV subtests, or similar tasks, have received initial validation in several samples for remote telepractice and digital format administration, and the results are considered a valid description of Ms. Dise's skills and abilities.  CLINICAL FINDINGS:  COGNITIVE FUNCTIONING  Wechsler Adult Intelligence Scale, Fourth Edition (WAIS-IV):  Ms. Brookover completed subtests of the WAIS-IV, a full-scale measure of cognitive ability. She completed subtests of the WAIS-IV, a full-scale measure of cognitive ability. The WAIS-IV is comprised of four indices that measure cognitive processes that are components of intellectual ability; however, only subtests from the  Verbal Comprehension and Working Memory indices were administered. As a result, Full-Scale-IQ (FSIQ) and General Ability Index (GAI) were unable to be determined.   WAIS-IV Scale/Subtest IQ/Scaled Score 95% Confidence Interval Percentile Rank Qualitative Description  Verbal Comprehension (VCI) 116 110-121 86 High Average  Similarities 11     Vocabulary 14     Information 14     Working Memory (WMI) 117 109-123 87 High Average  Digit Span 15     Arithmetic 11       The Verbal Comprehension Index (VCI) provides a measure of one's ability to receive, comprehend, and express language. It also measures the ability to retrieve previously learned information and to understand relationships between words and concepts presented orally. Ms. Horace obtained a VCI scaled score of 116 (86th percentile) placing her in the average range compared to same-aged peers. Her performance on the subtests comprising this index was diverse. Ms. Gonia demonstrated the strongest performance on the Information and Vocabulary subtests. The Information subtest is primarily a measure of her fund of general knowledge but may also be influenced by cultural experience, quality of education, and ability to retrieve information from long-term memory. The Vocabulary subtest required her to explain the meaning of words presented in isolation. Additionally, performance on this subtest requires abilities to verbalize meaningful concepts, as well as retrieve information from long-term memory. Her lowest performance was on the Similarities subtest, which measured her ability to abstract meaningful concepts and relationships from verbally presented material.  The Working Memory Index ([place]) provides a measure of one's ability to sustain attention, concentrate, and exert mental control. Ms. Haralson obtained a WMI scaled score of 117 (87th percentile), placing her in the average range compared to same-aged peers. Her WMI should be interpreted  with some caution because of the large amount of scatter among the subtests. This amount of scatter was obtained by fewer than 15% of the individuals in the WAIS-IV standardization sample. Ms. Woodrum performed much better on the Digit Span subtests than on the Arithmetic subtest, which may suggest a less well-developed ability in numerical calculations.  ATTENTION AND PROCESSING  CNS Vital Signs: The CNS Vital Signs assessment evaluates the neurocognitive status of an individual and covers a range of mental processes. The results of the CNS Vital Signs testing indicated average neurocognitive processing ability. Regarding attention, simple attention was in the average range and  complex attention and sustained attention were strengths in the above range. Psychomotor speed and processing speed were strengths in the above range. Motor speed and reaction time were average. Cognitive flexibility was average. Working memory was a strength in the above range. Visual memory (images) and verbal memory (words) were weaknesses in the low average range. The results suggest Ms. Cocke experiences strengths in psychomotor speed, complex attention, processing speed, working memory, and sustained attention with weaknesses in verbal memory and visual memory. Upon follow-up, Ms. Faires noted she enjoyed the CNS as portions of it were "like a game" which may have positively impacted her performance on the measure.  Domain  Standard Score Percentile Validity Indicator Guideline  Neurocognitive Index 106 66 Yes Average  Composite Memory 77 6 Yes Low  Verbal Memory 80 9 Yes Low Average  Visual Memory 84 14 Yes Low Average  Psychomotor Speed 126 96 Yes Above  Reaction Time 108 70 Yes Average  Complex Attention 115 84 Yes Above  Cognitive Flexibility 104 61 Yes Average  Processing Speed  146 99 Yes Above  Executive Function 105 63 Yes Average  Working Memory 111 77 Yes Above  Sustained Attention 110 75 Yes Above   Simple Attention 107 68 Yes Average  Motor Speed 105 63 Yes Average   EXECUTIVE FUNCTION  Behavior Rating Inventory of Executive Function, Second Edition (BRIEF-A) SELF-REPORT: Ms. Sloniker completed the Self-Report Form of the Behavior Rating Inventory of Executive Function-Adult Version (BRIEF-A), which has three domains that evaluate cognitive, behavioral, and emotional regulation, and a Global Executive Composite score provides an overall snapshot of executive functioning. There are no missing item responses in the protocol.  The Negativity, Infrequency, and Inconsistency scales are not elevated, suggesting she did not respond to the protocol in an overly negative, haphazard, extreme, or inconsistent manner. In the context of these validity considerations, ratings of Ms. Crespin's everyday executive function suggest some areas of concern. The overall index, the Global Executive Composite (GEC), was elevated (GEC T = 73, %ile = 99). The Behavioral Regulation Index (BRI) was within normal limits (BRI T = 64, %ile = 94) and the Metacognition Index (MI) was elevated (MI T = 77, %ile = 98). Ms. Klinkner indicated difficulty with her ability to adjust to changes in routine or task demands, initiate problem solving or activity, sustain working memory, attend to task-oriented output, and organize environment and materials. She did not describe her ability to inhibit impulsive responses, modulate emotions, monitor social behavior, and plan and organize problem-solving approaches as problematic; however, inhibiting impulsive responses, modulating emotions, and planning and organizing problem solving approaches approached abnormal elevation.    Scale/Index  Raw Score T Score Percentile  Inhibit 15 63 95  Shift 12 67 96  Emotional Control 18 61 91  Self-Monitor 10 56 82  Behavioral Regulation Index (BRI) 55 64 94  Initiate 19 78 99  Working Memory 18 77 99  Plan/Organize 17 60 87  Task Monitor 12 67 96   Organization of Materials 22 82 >99  Metacognition Index (MI) 88 77 98  Global Executive Composite (GEC) 143 73 99   Validity Scale Raw Score Cumulative Percentile Protocol Classification  Negativity 3 0 - 98.3 Acceptable  Infrequency 0 0 - 97.3 Acceptable  Inconsistency 3 0 - 99.2 Acceptable   Behavior Rating Inventory of Executive Function, Second Edition (BRIEF-A) INFORMANT:  Ms. Backer's son, Mr. Vilma Will, completed the Informant Form of the Behavior Rating Inventory of Executive Function-Adult Version (BRIEF-A), which is  equivalent to the Self-Report version and has three domains that evaluate cognitive, behavioral, and emotional regulation, and a Global Executive Composite score provides an overall snapshot of executive functioning. There are no missing item responses in the protocol.  The Negativity, Infrequency, and Inconsistency scales are not elevated, suggesting he did not respond to the protocol in an overly negative, haphazard, extreme, or inconsistent manner. In the context of these validity considerations, Mr. Struthers ratings of Ms. Bauserman's everyday executive function suggest some areas of concern. The overall index, the Global Executive Composite (GEC), was within the non-elevated range for age (Rochelle T = 65, %ile = 89). The Behavioral Regulation Index (BRI) was within normal limits (BRI T = 53, %ile = 71) and the Metacognition Index (MI) was elevated (MI T = 67, %ile = 94).  Mr. Serratore indicated Ms. Austin has trouble with her ability to organize the environment and materials. Mr. Earp did not describe her ability to inhibit impulsive responses, adjust to changes in routine or task demands, modulate emotions, monitor social behavior, initiate problem solving or activity, sustain working memory, plan and organize problem-solving approaches, and attend to task-oriented output as problematic; however, inhibiting impulsive responses, adjusting to changes in routine or task  demands, sustaining working memory, planning and organizing problem-solving approaches, and attending to task-oriented output approached abnormal elevation.  Scale/Index  Raw Score T Score Percentile  Inhibit 15 64 94  Shift 12 62 90  Emotional Control 10 40 26  Self-Monitor 9 51 68  Behavioral Regulation Index (BRI) 46 53 71  Initiate 13 57 84  Working Memory 15 64 92  Plan/Organize 19 63 90  Task Monitor 11 61 90  Organization of Materials 23 78 >99  Metacognition Index (MI) 81 67 94  Global Executive Composite (GEC) 127 61 89   Validity Scale Raw Score Cumulative Percentile Protocol Classification  Negativity 2 0 - 98.5 Acceptable  Infrequency 0 0 - 93.3 Acceptable  Inconsistency 5 0 - 98.8 Acceptable   BEHAVIORAL FUNCTIONING   Patient Health Questionnaire-9 (PHQ-9): Ms. Vannatter completed the PHQ-9, a self-report measure that assesses symptoms of depression. She scored 6/27, which indicates mild depression.   Generalized Anxiety Disorder- 7 (GAD-7): Ms. Swaby completed the GAD-7, a self-report measure that assesses symptoms of anxiety. She scored 6/21, which indicates mild anxiety.   Adult ADHD Self-Report Scale Symptom Checklist (ASRS): Ms. Paz reported the following symptoms as sometimes: difficulty wrapping up final details of a project following the completion of challenging aspects, fidgeting or squirming, misplacing or has difficulty finding things, difficulty waiting for turn in turn taking situations, and interrupting others when they are busy. She endorsed the following symptoms as occurring often: difficulty getting things in order when a task requires organization, problems remembering appointments or obligations, struggling to sustain attention when doing boring or repetitive work, struggling to concentrate on what people say even when they are speaking directly to her, feeling restless or fidgety, and talking too much in social situations. She endorsed the following  symptoms as very often: avoiding or delaying getting started on tasks requiring a lot of thought, being distracted by noise around her, and interrupting others or finishing their sentences. Endorsement of at least four items in Part A is highly consistent with ADHD in adults. The frequency scores of Part B provides additional cues. Ms. Babino scored a 4/6 on Part A and 7/12 on Part B, which is considered a positive screening for ADHD.   Personality Assessment Inventory (PAI): The PAI is  an objective inventory of adult personality. The validity indicators suggested Ms. Courts responded appropriately to item content (INF T = 48) and did not appear to present herself in an overly negative (NIM T = 44) or positive (PIM T = 57) manner, although she responded somewhat inconsistently to similar items (ICN T = 64). This inconsistency could have arisen from a variety of sources such as carelessness or confusion so her results should be interpreted with caution. Her profile is largely free of clinical issues or concerns, although she is endorsing some concerns regarding her health status (SOM-H T = 69) and an activity level that is somewhat higher than normal (MAN-A T = 60). She indicates having close, generally supportive relationships with friends and family (NON T = 30); being less preoccupied with the opinions of others than most other people (WRM T = 35); and being generally satisfied with herself and seeing little need for major changes in behavior (RXR T = 55).   SUMMARY AND CLINICAL IMPRESSIONS: Ms. Felice Hope is a 51 year old female who was referred by NP [name] for an evaluation to determine if she currently meets criteria for a diagnosis of Attention-Deficit/Hyperactivity Disorder (ADHD).   Ms. Christon reported her son was diagnosed with ADHD which caused her to wonder if "ADHD may be explaining some of the things [she] experiences." She also reported experiencing generalized anxiety "in more  recent years" that she ascribed to hormonal changes from menopause and feeling isolated; sleep onset and maintenance issues she attributed to her "mind thinking about a variety of things," "hot flashes" from menopause, tossing and turning in her sleep, and noises waking her up throughout the night; and periods of low mood that last "a day or two." She expressed a belief her ADHD-related concerns are independent of mood, although noted how efforts to concentrate, "filter [her] words," masking her emotional experiences, and "getting going" require significant effort and cause fatigue as well as how low mood and anxiety exacerbate "ADHD and vice versa."  During the evaluation, Ms. Dannenberg was administered assessments to measure her current cognitive abilities. Her verbal comprehension abilities were in the high average range, and she demonstrated the strongest performance on the Information and Vocabulary subtests. The Information subtest is primarily a measure of her fund of general knowledge but may also be influenced by cultural experience, quality of education, and ability to retrieve information from long-term memory. The Vocabulary subtest required her to explain the meaning of words presented in isolation. Additionally, performance on this subtest requires abilities to verbalize meaningful concepts, as well as retrieve information from long-term memory. Her lowest performance was on the Similarities subtest, which measured her ability to abstract meaningful concepts and relationships from verbally presented material. Her ability to sustain attention, concentrate, and exert mental control was in the high average range, although there was a large amount of scatter among the subtests. She performed much better on the Digit Span subtests than on the Arithmetic subtest which may suggest a less well-developed ability in numerical calculations. Results of the CNS Vital Signs indicated an average neurocognitive  processing ability, although she demonstrated a weakness in attentional abilities and working memory.   During the clinical interview and on self-report measures, Ms. Kates endorsed executive functioning impairment and attentional dysregulation, hyperactivity- and impulsivity-related symptoms, and meeting full criteria for ADHD. Moreover, Ms. Steinert's son, Mr. Kareen Jefferys, indicated she experiences a variety of executive functioning-related issues but at an overall less severe level than Ms. Nicole perceives herself as  experiencing. While invalid test results make interpretation difficult, when considering self-reported symptoms; endorsed and/or demonstrated impairment on measures of working memory, executive functioning, and memory; and a reported familial history of ADHD, a diagnosis of F90.2 Attention-Deficit/Hyperactivity Disorder, Combined Presentation, Moderate appears warranted. The specifier of "Moderate" was assigned as her current symptoms appear to cause marked impairment in social, daily, academic and occupational functioning. While Ms. Clingan endorsed experiencing generalized anxiety, she expressed a belief it is related to ongoing hormonal changes. Given the limited scope of this evaluation, it was unable to be determined if full criteria for GAD is met or if the symptoms are better explained by her diagnosis of ADHD and/or ongoing hormonal changes. She would likely benefit from further evaluation of these symptoms to definitively rule in or out generalized anxiety disorder.   DSM-5 Diagnostic Impressions: F90.2 Attention-Deficit/Hyperactivity Disorder, Combined Presentation, Moderate  RECOMMENDATIONS: Ms. Fennewald would likely benefit from making use of strategies for ADHD symptoms:  Setting a timer to complete tasks. Breaking tasks into manageable chunks and spreading them out over longer periods of time with breaks.  Utilizing lists and day calendars to keep track of tasks.   Answering emails daily.  Improving listening skills by asking the speaker to give information in smaller chunks and asking for explanation for clarification as needed. Leaving more than the anticipated time to complete tasks. It may help to keep tasks brief, well within your attention span, and a mix of both high and low interest tasks. Tasks may be gradually increased in length. Practice proactive planning by setting aside time every evening to plan for the next day (e.g., prepare needed materials or pack the car the night before).  Learn how to make an effective and reasonable "to do" list of important tasks and priorities and always keep it easily accessible. Make additional copies in case it is lost or misplaced. Utilize visual reminders by posting appointments, "to do lists," or schedule in strategic areas at home and at work.  Practice using an appointment book, smart phone or other tech device, or a daily planning calendar, and learn to write down appointments and commitments immediately. Keep notepads or use a portable audio recorder to capture important ideas that would be beneficial to recall later. Learn and practice time management skills. Purchase a programmable alarm watch or set an alarm on smartphone to avoid losing track of time.  Use a color-coded file system, desk and closet organizers, storage boxes, or other organization devices to reduce clutter and improve efficiency and structure.  Implement ways to become more aware of your actions and to inhibit or adjust them as warranted (e.g., reviewing videos of your actions, consider consequences of obeying or not obeying the rules of various upcoming situations, have a trusted other to discuss plans with and/or provide cues to stop certain behaviors, and make visual cues for rules you would like to follow). Stay flexible and be prepared to change your plans as symptom breakthroughs and crises are likely to occur periodically. Ms.  Doxtater may benefit from mindfulness training to address symptoms of inattention.  Ms. Marschall would likely benefit from a consultation regarding medication for ADHD symptoms.   Individual therapeutic services may assist in processing a diagnosis of ADHD and discussing coping and compensatory strategies. Mental alertness/energy can be raised by increasing exercise; improving sleep; eating a healthy diet; and managing stress. Consult with a physician regarding any changes to physical regimen is recommended. "Failing at Normal: An ADHD Success Story" by Mellody Drown  is a great overview of ADHD.  Organizations that are a good source of information on ADHD:  Children and Adults with Attention-Deficit/Hyperactivity Disorder (CHADD): chadd.org  Attention Deficit Disorder Association (ADDA) CondoFactory.com.cy ADD Resources: addresources.org ADD WareHouse: addwarehouse.com World Federation of ADHD: adhd-federation.org ADDConsults: https://www.hines.net/. Future evaluation if deemed necessary and/or to determine effectiveness of recommended interventions.   Clarice Pole, Psy.D. Licensed Psychologist - [facility] #[id number]              Dolores Lory, PsyD

## 2022-03-01 ENCOUNTER — Telehealth: Payer: Self-pay | Admitting: Family

## 2022-03-01 DIAGNOSIS — F902 Attention-deficit hyperactivity disorder, combined type: Secondary | ICD-10-CM

## 2022-03-01 NOTE — Telephone Encounter (Signed)
Reviewed her ADHD evaluation which shows that she does in fact have ADHD. Please schedule OV so we can discuss treatment options.

## 2022-03-02 ENCOUNTER — Encounter: Payer: Self-pay | Admitting: Internal Medicine

## 2022-03-02 ENCOUNTER — Other Ambulatory Visit: Payer: Self-pay | Admitting: Internal Medicine

## 2022-03-02 MED ORDER — TRULICITY 4.5 MG/0.5ML ~~LOC~~ SOAJ: 4.5000 mg | SUBCUTANEOUS | 3 refills | Status: DC

## 2022-03-02 NOTE — Telephone Encounter (Signed)
Patient scheduled.

## 2022-03-12 ENCOUNTER — Other Ambulatory Visit: Payer: Self-pay | Admitting: Family

## 2022-03-16 ENCOUNTER — Encounter: Payer: Self-pay | Admitting: Family

## 2022-03-16 ENCOUNTER — Telehealth: Payer: Self-pay

## 2022-03-16 ENCOUNTER — Ambulatory Visit: Payer: 59 | Admitting: Family

## 2022-03-16 VITALS — BP 128/62 | HR 92 | Temp 98.0°F | Resp 18 | Ht 65.0 in | Wt 277.0 lb

## 2022-03-16 DIAGNOSIS — F902 Attention-deficit hyperactivity disorder, combined type: Secondary | ICD-10-CM

## 2022-03-16 MED ORDER — AMPHETAMINE-DEXTROAMPHETAMINE 5 MG PO TABS: 5.0000 mg | ORAL_TABLET | Freq: Two times a day (BID) | ORAL | 0 refills | Status: DC

## 2022-03-16 MED ORDER — AMPHETAMINE-DEXTROAMPHETAMINE 5 MG PO TABS: 5.0000 mg | ORAL_TABLET | Freq: Every day | ORAL | 0 refills | Status: DC

## 2022-03-16 NOTE — Progress Notes (Signed)
Subjective:   By signing my name below, I, Audrey Goodman, attest that this documentation has been prepared under the direction and in the presence of Karie Chimera, NP 03/13/2022   Patient ID: Audrey Goodman, female    DOB: 14-Jan-1971, 51 y.o.   MRN: [id number]  No chief complaint on file.   HPI Patient is in today for an office visit  ADHD: She is interested in starting an extended release medication for her symptoms   Immunizations: She reports that she is UTD on her influenza and Covid-19 vaccines.   Health Maintenance Due  Topic Date Due   Hepatitis C Screening  Never done   COVID-19 Vaccine (6 - Mixed Product series) 04/16/2020   Diabetic kidney evaluation - Urine ACR  03/21/2021   Zoster Vaccines- Shingrix (1 of 2) Never done   Diabetic kidney evaluation - GFR measurement  11/29/2021   MAMMOGRAM  12/01/2021   INFLUENZA VACCINE  12/26/2021    Past Medical History:  Diagnosis Date   Anxiety    Arthritis    left shoulder   Back pain    Chronic kidney disease    stage 1   Depression    Diabetes mellitus without complication (HCC)    type 2- on meds   Diverticulitis    High cholesterol    History of chicken pox    Hyperlipidemia    on meds   Hypertension    on meds   Joint pain    Lactose intolerance    PCOS (polycystic ovarian syndrome)    Seasonal allergies    Shingles    Sleep apnea    does NOT use CPAP   SVD (spontaneous vaginal delivery) 1995   x 1   Type 2 diabetes mellitus, uncontrolled 07/30/2006   Qualifier: Diagnosis of  By: Drue Flirt  MD, Merrily Brittle      Past Surgical History:  Procedure Laterality Date   ANKLE FRACTURE SURGERY  05/28/1989   right   CHOLECYSTECTOMY  05/28/2006   COLONOSCOPY  1989   due to blood in stool and diverticulitis per pt   HYSTEROSCOPY N/A 09/13/2014   Procedure: HYSTEROSCOPY with IUD removal;  Surgeon: Linda Hedges, DO;  Location: [facility];  Service: Gynecology;  Laterality: N/A;  need longest speculum and  instruments that aresavailable due to patient's habitus (BMI ~60)    Family History  Problem Relation Age of Onset   Obesity Mother    Colon polyps Mother    Cancer Mother        breast   Hyperlipidemia Mother    Heart disease Mother    Hypertension Mother    Mental illness Mother        depression   Diabetes Mother    Alcoholism Father    Hyperlipidemia Father    Stroke Father    Hypertension Father    Cancer Maternal Grandmother        colon   Diabetes Maternal Grandmother    Diabetes Maternal Aunt    Glaucoma Maternal Aunt    Colon polyps Maternal Uncle    Colon cancer Maternal Uncle    Diabetes Maternal Uncle    Diabetes Maternal Uncle    Esophageal cancer Neg Hx    Rectal cancer Neg Hx    Stomach cancer Neg Hx     Social History   Socioeconomic History   Marital status: Single    Spouse name: Not on file   Number of children: Not on file  Years of education: Not on file   Highest education level: Not on file  Occupational History   Not on file  Tobacco Use   Smoking status: Never   Smokeless tobacco: Never  Vaping Use   Vaping Use: Never used  Substance and Sexual Activity   Alcohol use: No    Alcohol/week: 0.0 standard drinks of alcohol   Drug use: No   Sexual activity: Yes    Partners: Male    Birth control/protection: Pill  Other Topics Concern   Not on file  Social History Narrative   Single- lives alone   Son 62- senior at [employer]   Enjoys reading   Works as a Marketing executive at [employer] Strain: Not on [employer] Insecurity: Not on file  Transportation Needs: Not on file  Physical Activity: Not on file  Stress: Not on file  Social Connections: Not on file  Intimate Partner Violence: Not on file    Outpatient Medications Prior to Visit  Medication Sig Dispense Refill   amLODipine (NORVASC) 5 MG tablet TAKE 1 TABLET BY MOUTH EVERY DAY 90 tablet 0   cetirizine (ZYRTEC) 10 MG  tablet Take 10 mg by mouth daily.     dapagliflozin propanediol (FARXIGA) 10 MG TABS tablet TAKE 1 TABLET BY MOUTH DAILY BEFORE BREAKFAST AS DIRECTED 90 tablet 3   Dulaglutide (TRULICITY) 4.5 0000000 SOPN Inject 4.5 mg as directed once a week. 6 mL 3   FLUoxetine (PROZAC) 20 MG capsule TAKE 1 CAPSULE BY MOUTH EVERY DAY 90 capsule 1   insulin aspart (NOVOLOG FLEXPEN) 100 UNIT/ML FlexPen INJECT 15-25 UNITS (DEPENDING ON MEAL SIZE) UNDER THE SKIN 3 TIMES A DAY 60 mL 3   insulin detemir (LEVEMIR FLEXTOUCH) 100 UNIT/ML FlexPen Inject 30 Units into the skin at bedtime. 30 mL 3   Insulin Pen Needle 32G X 4 MM MISC Use to inject insulin 4 times daily 400 each 3   metFORMIN (GLUCOPHAGE-XR) 750 MG 24 hr tablet Take 1 tablet (750 mg total) by mouth 2 (two) times daily. 180 tablet 3   Multiple Vitamin (MULTIVITAMIN PO) Take 1 tablet by mouth daily at 6 (six) AM.     rosuvastatin (CRESTOR) 10 MG tablet TAKE 1 TABLET BY MOUTH EVERY DAY 90 tablet 1   spironolactone (ALDACTONE) 50 MG tablet TAKE 1 TABLET BY MOUTH TWICE A DAY 180 tablet 3   WEGOVY 2.4 MG/0.75ML SOAJ INJECT 2.4 MG INTO THE SKIN ONCE A WEEK 3 mL 5   No facility-administered medications prior to visit.    Allergies  Allergen Reactions   Lisinopril Swelling    Swelling of the left side of tongue   Lipitor [Atorvastatin] Other (See Comments)    Myalgia/foot cramping    ROS See HPI    Objective:    Physical Exam Constitutional:      General: She is not in acute distress.    Appearance: Normal appearance. She is not ill-appearing.  HENT:     Head: Normocephalic and atraumatic.     Right Ear: External ear normal.     Left Ear: External ear normal.  Eyes:     Extraocular Movements: Extraocular movements intact.     Pupils: Pupils are equal, round, and reactive to light.  Cardiovascular:     Rate and Rhythm: Normal rate and regular rhythm.     Heart sounds: Normal heart sounds. No murmur heard.    No gallop.  Pulmonary:      Effort: Pulmonary effort is normal. No respiratory distress.     Breath sounds: Normal breath sounds. No wheezing or rales.  Skin:    General: Skin is warm and dry.  Neurological:     Mental Status: She is alert and oriented to person, place, and time.  Psychiatric:        Mood and Affect: Mood normal.        Behavior: Behavior normal.        Judgment: Judgment normal.     BP 128/62   Pulse 92   Temp 98 F (36.7 C)   Resp 18   Ht 5\' 5"  (1.651 m)   Wt 277 lb (125.6 kg)   SpO2 99%   BMI 46.10 kg/m  Wt Readings from Last 3 Encounters:  03/16/22 277 lb (125.6 kg)  09/28/21 270 lb 9.6 oz (122.7 kg)  05/31/21 275 lb (124.7 kg)       Assessment & Plan:   Problem List Items Addressed This Visit       Unprioritized   Attention deficit hyperactivity disorder, combined type, moderate - Primary    Uncontrolled. Will initiate adderall 5mg  bid. Reviewed [place] Controlled substance registry.  Pt signed controlled substance contract and a UDS will be obtained. Plan follow up in about 3 weeks. Will need to watch blood pressure closely.       Relevant Orders   DRUG MONITORING, PANEL 8 WITH CONFIRMATION, URINE   Meds ordered this encounter  Medications   amphetamine-dextroamphetamine (ADDERALL) 5 MG tablet    Sig: Take 1 tablet (5 mg total) by mouth daily.    Dispense:  60 tablet    Refill:  0    Order Specific Question:   Supervising Provider    Answer:   Penni Homans A [[id number]]    I, Nance Pear, NP, personally preformed the services described in this documentation.  All medical record entries made by the scribe were at my direction and in my presence.  I have reviewed the chart and discharge instructions (if applicable) and agree that the record reflects my personal performance and is accurate and complete. Karie Chimera, NP 03/13/2022   I,Amber Collins,acting as a scribe for Nance Pear, NP.,have documented all relevant documentation on the behalf of  Nance Pear, NP,as directed by  Nance Pear, NP while in the presence of Nance Pear, NP.   Nance Pear, NP

## 2022-03-16 NOTE — Telephone Encounter (Signed)
PA approved. Effective 03/16/2022 to 03/15/2025

## 2022-03-16 NOTE — Assessment & Plan Note (Signed)
Uncontrolled. Will initiate adderall 5mg  bid. Reviewed [place] Controlled substance registry.  Pt signed controlled substance contract and a UDS will be obtained. Plan follow up in about 3 weeks. Will need to watch blood pressure closely.

## 2022-03-16 NOTE — Telephone Encounter (Signed)
PA initiated via Covermymeds; KEY: [id number]. Awaiting determination.

## 2022-03-17 LAB — DRUG MONITORING, PANEL 8 WITH CONFIRMATION, URINE
6 Acetylmorphine: NEGATIVE ng/mL (ref <10)
Alcohol Metabolites: NEGATIVE ng/mL (ref <500)
Amphetamines: NEGATIVE ng/mL (ref <500)
Benzodiazepines: NEGATIVE ng/mL (ref <100)
Buprenorphine, Urine: NEGATIVE ng/mL (ref <5)
Cocaine Metabolite: NEGATIVE ng/mL (ref <150)
Creatinine: 135.9 mg/dL (ref >=20.0)
MDMA: NEGATIVE ng/mL (ref <500)
Marijuana Metabolite: NEGATIVE ng/mL (ref <20)
Opiates: NEGATIVE ng/mL (ref <100)
Oxidant: NEGATIVE ug/mL (ref <200)
Oxycodone: NEGATIVE ng/mL (ref <100)
pH: 5.6 (ref 4.5–9.0)

## 2022-03-17 LAB — DM TEMPLATE

## 2022-03-19 ENCOUNTER — Encounter: Payer: Self-pay | Admitting: Internal Medicine

## 2022-04-05 ENCOUNTER — Other Ambulatory Visit: Payer: Self-pay | Admitting: Family

## 2022-04-06 ENCOUNTER — Encounter: Payer: Self-pay | Admitting: Family

## 2022-04-06 MED ORDER — AMPHETAMINE-DEXTROAMPHETAMINE 10 MG PO TABS: 10.0000 mg | ORAL_TABLET | Freq: Two times a day (BID) | ORAL | 0 refills | Status: DC

## 2022-04-11 ENCOUNTER — Ambulatory Visit: Payer: 59 | Admitting: Family

## 2022-04-13 ENCOUNTER — Ambulatory Visit: Payer: 59 | Admitting: Family

## 2022-04-13 VITALS — BP 128/62 | HR 87 | Temp 98.7°F | Resp 16 | Wt 266.0 lb

## 2022-04-13 DIAGNOSIS — F902 Attention-deficit hyperactivity disorder, combined type: Secondary | ICD-10-CM | POA: Diagnosis not present

## 2022-04-13 DIAGNOSIS — H6123 Impacted cerumen, bilateral: Secondary | ICD-10-CM | POA: Diagnosis not present

## 2022-04-13 NOTE — Progress Notes (Signed)
Subjective:   By signing my name below, I, Shehryar Baig, attest that this documentation has been prepared under the direction and in the presence of [name], NP. 04/13/2022    Patient ID: Audrey Goodman, female    DOB: 31-Mar-1971, 51 y.o.   MRN: [id number]  Chief Complaint  Patient presents with   ADHD    Here for follow up   Ear Fullness    Complains of bilateral ear fullness, more on the right    Patient is in today for a follow up visit.   Adderall: She started taking 10 mg Adderall since last visit and finds improvement. She finds she can keep her focus while working as compared to prior she was taking Adderall. She reports losing weight while taking Adderall, otherwise she has no symptoms while taking it.   Ear fullness: She complains of ear fullness in both ears. Her right ear feels more full than the left.    Health Maintenance Due  Topic Date Due   Hepatitis C Screening  Never done   Diabetic kidney evaluation - Urine ACR  03/21/2021   Zoster Vaccines- Shingrix (1 of 2) Never done   Diabetic kidney evaluation - GFR measurement  11/29/2021   MAMMOGRAM  12/01/2021   INFLUENZA VACCINE  12/26/2021   COVID-19 Vaccine (6 - 2023-24 season) 01/26/2022   HEMOGLOBIN A1C  03/31/2022    Past Medical History:  Diagnosis Date   Anxiety    Arthritis    left shoulder   Back pain    Chronic kidney disease    stage 1   Depression    Diabetes mellitus without complication (HCC)    type 2- on meds   Diverticulitis    High cholesterol    History of chicken pox    Hyperlipidemia    on meds   Hypertension    on meds   Joint pain    Lactose intolerance    PCOS (polycystic ovarian syndrome)    Seasonal allergies    Shingles    Sleep apnea    does NOT use CPAP   SVD (spontaneous vaginal delivery) 1995   x 1   Type 2 diabetes mellitus, uncontrolled 07/30/2006   Qualifier: Diagnosis of  By: Lanier Prude  MD, Cathrine Muster      Past Surgical History:  Procedure  Laterality Date   ANKLE FRACTURE SURGERY  05/28/1989   right   CHOLECYSTECTOMY  05/28/2006   COLONOSCOPY  1989   due to blood in stool and diverticulitis per pt   HYSTEROSCOPY N/A 09/13/2014   Procedure: HYSTEROSCOPY with IUD removal;  Surgeon: Mitchel Honour, DO;  Location: [facility];  Service: Gynecology;  Laterality: N/A;  need longest speculum and instruments that aresavailable due to patient's habitus (BMI ~60)    Family History  Problem Relation Age of Onset   Obesity Mother    Colon polyps Mother    Cancer Mother        breast   Hyperlipidemia Mother    Heart disease Mother    Hypertension Mother    Mental illness Mother        depression   Diabetes Mother    Alcoholism Father    Hyperlipidemia Father    Stroke Father    Hypertension Father    Cancer Maternal Grandmother        colon   Diabetes Maternal Grandmother    Diabetes Maternal Aunt    Glaucoma Maternal Aunt    Colon polyps Maternal  Uncle    Colon cancer Maternal Uncle    Diabetes Maternal Uncle    Diabetes Maternal Uncle    Esophageal cancer Neg Hx    Rectal cancer Neg Hx    Stomach cancer Neg Hx     Social History   Socioeconomic History   Marital status: Single    Spouse name: Not on file   Number of children: Not on file   Years of education: Not on file   Highest education level: Not on file  Occupational History   Not on file  Tobacco Use   Smoking status: Never   Smokeless tobacco: Never  Vaping Use   Vaping Use: Never used  Substance and Sexual Activity   Alcohol use: No    Alcohol/week: 0.0 standard drinks of alcohol   Drug use: No   Sexual activity: Yes    Partners: Male    Birth control/protection: Pill  Other Topics Concern   Not on file  Social History Narrative   Single- lives alone   Son 21- senior at [employer]   Enjoys reading   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 60 tablet 0   cetirizine (ZYRTEC) 10 MG tablet Take 10 mg by mouth daily.     dapagliflozin propanediol (FARXIGA) 10 MG TABS tablet TAKE 1 TABLET BY MOUTH DAILY BEFORE BREAKFAST AS DIRECTED 90 tablet 3   Dulaglutide (TRULICITY) 4.5 MG/0.5ML SOPN Inject 4.5 mg as directed once a week. 6 mL 3   FLUoxetine (PROZAC) 20 MG capsule TAKE 1 CAPSULE BY MOUTH EVERY DAY 90 capsule 1   insulin aspart (NOVOLOG FLEXPEN) 100 UNIT/ML FlexPen INJECT 15-25 UNITS (DEPENDING ON MEAL SIZE) UNDER THE SKIN 3 TIMES A DAY 60 mL 3   insulin detemir (LEVEMIR FLEXTOUCH) 100 UNIT/ML FlexPen Inject 30 Units into the skin at bedtime. 30 mL 3   Insulin Pen Needle 32G X 4 MM MISC Use to inject insulin 4 times daily 400 each 3   metFORMIN (GLUCOPHAGE-XR) 750 MG 24 hr tablet Take 1 tablet (750 mg total) by mouth 2 (two) times daily. 180 tablet 3   Multiple Vitamin (MULTIVITAMIN PO) Take 1 tablet by mouth daily at 6 (six) AM.     rosuvastatin (CRESTOR) 10 MG tablet TAKE 1 TABLET BY MOUTH EVERY DAY 90 tablet 1   spironolactone (ALDACTONE) 50 MG tablet TAKE 1 TABLET BY MOUTH TWICE A DAY 180 tablet 3   No facility-administered medications prior to visit.    Allergies  Allergen Reactions   Lisinopril Swelling    Swelling of the left side of tongue   Lipitor [Atorvastatin] Other (See Comments)    Myalgia/foot cramping    Review of Systems  HENT:         (+)bilateral ear fullness (R>L)       Objective:    Physical Exam Constitutional:      General: She is not in acute  distress.    Appearance:  Normal appearance. She is not ill-appearing.  HENT:     Head: Normocephalic and atraumatic.     Right Ear: Ear canal and external ear normal. There is impacted cerumen.     Left Ear: Tympanic membrane, ear canal and external ear normal. There is impacted cerumen (partial).  Eyes:     Extraocular Movements: Extraocular movements intact.     Pupils: Pupils are equal, round, and reactive to light.  Cardiovascular:     Rate and Rhythm: Normal rate and regular rhythm.     Heart sounds: Normal heart sounds. No murmur heard.    No gallop.  Pulmonary:     Effort: Pulmonary effort is normal. No respiratory distress.     Breath sounds: Normal breath sounds. No wheezing or rales.  Skin:    General: Skin is warm and dry.  Neurological:     Mental Status: She is alert and oriented to person, place, and time.  Psychiatric:        Judgment: Judgment normal.     BP 128/62 (BP Location: Right Arm, Patient Position: Sitting, Cuff Size: Large)   Pulse 87   Temp 98.7 F (37.1 C) (Oral)   Resp 16   Wt 266 lb (120.7 kg)   SpO2 100%   BMI 44.26 kg/m  Wt Readings from Last 3 Encounters:  04/13/22 266 lb (120.7 kg)  03/16/22 277 lb (125.6 kg)  09/28/21 270 lb 9.6 oz (122.7 kg)       Assessment & Plan:   Problem List Items Addressed This Visit       Unprioritized   Impacted cerumen of both ears - Primary    After verbal consent, CMA irrigated both ears with removal of cerumen revealing normal TM's.  Pt tolerated procedure well.      Attention deficit hyperactivity disorder, combined type, moderate    Improved/ stable. Continue current dose of adderall.         No orders of the defined types were placed in this encounter.   I, Lemont Fillers, NP, personally preformed the services described in this documentation.  All medical record entries made by the scribe were at my direction and in my presence.  I have reviewed the chart and discharge instructions (if applicable) and agree  that the record reflects my personal performance and is accurate and complete. 04/13/2022   I,Shehryar Baig,acting as a Neurosurgeon for Lemont Fillers, NP.,have documented all relevant documentation on the behalf of Lemont Fillers, NP,as directed by  Lemont Fillers, NP while in the presence of Lemont Fillers, NP.   Lemont Fillers, NP

## 2022-04-15 NOTE — Assessment & Plan Note (Signed)
After verbal consent, CMA irrigated both ears with removal of cerumen revealing normal TM's.  Pt tolerated procedure well.

## 2022-04-15 NOTE — Assessment & Plan Note (Signed)
Improved/ stable. Continue current dose of adderall.

## 2022-05-14 ENCOUNTER — Other Ambulatory Visit: Payer: Self-pay | Admitting: Family

## 2022-05-28 ENCOUNTER — Other Ambulatory Visit: Payer: Self-pay | Admitting: Family

## 2022-05-29 MED ORDER — AMPHETAMINE-DEXTROAMPHETAMINE 10 MG PO TABS: 10.0000 mg | ORAL_TABLET | Freq: Two times a day (BID) | ORAL | 0 refills | Status: DC

## 2022-05-29 NOTE — Telephone Encounter (Signed)
Requesting:adderall 10mg  Contract:03/16/22 UDS:03/16/22 Last Visit:04/13/22 Next Visit:07/13/21 Last Refill:04/05/22  Please Advise

## 2022-06-08 LAB — LAB REPORT - SCANNED
A1c: 7.2
Albumin, Urine POC: 6.1
Albumin/Creatinine Ratio, Urine, POC: 7
Creatinine, POC: 87.4 mg/dL
EGFR: 70
Protein/Creatinine Ratio: 203

## 2022-06-15 ENCOUNTER — Ambulatory Visit: Payer: 59 | Admitting: Internal Medicine

## 2022-06-15 ENCOUNTER — Encounter: Payer: Self-pay | Admitting: Internal Medicine

## 2022-06-15 VITALS — BP 128/78 | HR 91 | Ht 65.0 in | Wt 265.0 lb

## 2022-06-15 DIAGNOSIS — E282 Polycystic ovarian syndrome: Secondary | ICD-10-CM

## 2022-06-15 DIAGNOSIS — E785 Hyperlipidemia, unspecified: Secondary | ICD-10-CM

## 2022-06-15 DIAGNOSIS — E1165 Type 2 diabetes mellitus with hyperglycemia: Secondary | ICD-10-CM

## 2022-06-15 MED ORDER — FREESTYLE LIBRE 3 SENSOR MISC: 1.0000 | 3 refills | Status: DC

## 2022-06-15 MED ORDER — OZEMPIC (1 MG/DOSE) 4 MG/3ML ~~LOC~~ SOPN: 1.0000 mg | PEN_INJECTOR | SUBCUTANEOUS | 3 refills | Status: DC

## 2022-06-15 NOTE — Patient Instructions (Addendum)
Please continue: - Metformin ER 1500 mg with dinner - Farxiga 10 mg in am, before b'fast   - Novolog 12-15 units depending on the size of the meal - Levemir 30 units at bedtime  Try to start: - Ozempic 1 mg weekly  Please return in 4 months with your sugar log or CGM.

## 2022-06-15 NOTE — Progress Notes (Signed)
Patient ID: Audrey Goodman, female   DOB: 1970/12/21, 52 y.o.   MRN: [id number]   HPI: Audrey Goodman is a 52 y.o.-year-old female, returning for f/u for DM2, dx with GDM in 1995, then DM 6 mo after son was born, insulin-dependent since ~2010, controlled, without long-term complications and PCOS. Last visit 8 months ago.  Interim history: She was previously seen with weight management clinic at [facility] - nonsurgical weight loss program.  She lost 20 pounds on their diet but could not be continue due to price.  Since last visit, insurance denied Wegovy since she is now on Trulicity.  Her nephrologist suggested Ozempic and this is covered by her insurance. Since last visit, she lost approximately 5 pounds. No increased urination, blurry vision, nausea, chest pain.  She just found out she had ADHD. She just returned from a [place] - had dietary indiscretions.  Reviewed HbA1c levels: 06/07/2022: HbA1c 7.2% Lab Results  Component Value Date   HGBA1C 6.3 (A) 09/28/2021   HGBA1C 7.0 (A) 05/31/2021   HGBA1C 7.0 (A) 10/14/2020  05/31/2021: HbA1c 7.0%  She is on: - Metformin ER 750 mg 2x a day - Farxiga 10 mg in am, before b'fast - Levemir 25 >> 30 units at bedtime - Novolog 8-15 units depending on the size of the meal - Trulicity 4.5 mg weekly >> Wegovy 2.4 mg weekly (started 05/2021; declined by insurance [id number]/0973) >> Trulicity 4.5 mg weekly Of note, she could not tolerate Jardiance 10 (started 01/2018) because of increased urination.  Pt checks her sugars 2-3 times a day: - am:  110-159 (cruise) >> 106-179, 194 >> 119-187 >> 84-, 116-171 - 2h after b'fast:  120-125 >> 151 >> n/c >> 124-167, 187 >> 182 - before lunch: 91-136, 165, 187 >> n/c >> 124-151 >> 86-148, 191 >> 98-130 - 2h after lunch:  150-180 >> n/c >> 150-190 >> n/c>> 96-177, 200 >> n/c - before dinner:  101-122, 171, 182 >> 140-160 >> 129 >> 67-177 >> 113-146 - 2h after dinner:165-194 >> 90-220 >> n/c >>  89-189, 205 >> n/c >> 137-201 - bedtime: 127-204 >> 160-200 (snack) >> n/c >> 106-154 - nighttime: n/c Lowest sugar was  69 (ate a small dinner) ... >> 90 >> 106 >> 54 >> 84; she has hypoglycemia awareness in the 80s. Highest sugar was 501 (streroid) >> ...302 >> 194 >> 288>> 201.  + CKD with proteinuria-sees nephrology (Dr. Hollie Salk), last BUN/creatinine:  09/07/2021: 17/0.84, GFR 85, protein to creatinine ratio 83 (0-200), glucose 123 Lab Results  Component Value Date   BUN 19 11/29/2020   CREATININE 1.03 (H) 11/29/2020  She is off lisinopril.  + HL; last set of lipids: Lab Results  Component Value Date   CHOL 159 11/29/2020   HDL 51 11/29/2020   LDLCALC 90 11/29/2020   TRIG 99 11/29/2020   CHOLHDL 2.9 03/21/2020  06/16/2019: 143/98/44/82 She had leg cramps with Crestor 10. Started drinking more water >> helped.  - last eye exam was: 11/16/2021: No DR; Dr. Katy Fitch.  - no numbness and tingling in her feet.  Last foot exam 09/28/2021.  Latest TSH: 06/16/2019: 3.279  PCOS: -She had irregular menses since menarche -+1 pregnancies, no miscarriages - was on Mirena IUD - for ~4.5 years >> then on Sprintec since 2016 >> then Orthocycen >> now Nexplanon since 2020 -She continues on metformin ER 750 mg twice a day and spironolactone 50 mg twice a day -this helps with her hirsutism  Latest potassium level is normal: Lab Results  Component Value Date   K 4.9 11/29/2020   ROS: + see HPI  I reviewed pt's medications, allergies, PMH, social hx, family hx, and changes were documented in the history of present illness. Otherwise, unchanged from my initial visit note.  Past Medical History:  Diagnosis Date   Anxiety    Arthritis    left shoulder   Back pain    Chronic kidney disease    stage 1   Depression    Diabetes mellitus without complication (HCC)    type 2- on meds   Diverticulitis    High cholesterol    History of chicken pox    Hyperlipidemia    on meds   Hypertension     on meds   Joint pain    Lactose intolerance    PCOS (polycystic ovarian syndrome)    Seasonal allergies    Shingles    Sleep apnea    does NOT use CPAP   SVD (spontaneous vaginal delivery) 1995   x 1   Type 2 diabetes mellitus, uncontrolled 07/30/2006   Qualifier: Diagnosis of  By: Drue Flirt  MD, Merrily Brittle     Past Surgical History:  Procedure Laterality Date   ANKLE FRACTURE SURGERY  05/28/1989   right   CHOLECYSTECTOMY  05/28/2006   COLONOSCOPY  1989   due to blood in stool and diverticulitis per pt   HYSTEROSCOPY N/A 09/13/2014   Procedure: HYSTEROSCOPY with IUD removal;  Surgeon: Linda Hedges, DO;  Location: [facility];  Service: Gynecology;  Laterality: N/A;  need longest speculum and instruments that aresavailable due to patient's habitus (BMI ~60)   Social History   Socioeconomic History   Marital status: Single    Spouse name: Not on file   Number of children: Not on file   Years of education: Not on file   Highest education level: Not on file  Occupational History   Not on file  Tobacco Use   Smoking status: Never   Smokeless tobacco: Never  Vaping Use   Vaping Use: Never used  Substance and Sexual Activity   Alcohol use: No    Alcohol/week: 0.0 standard drinks of alcohol   Drug use: No   Sexual activity: Yes    Partners: Male    Birth control/protection: Pill  Other Topics Concern   Not on file  Social History Narrative   Single- lives alone   Son 90- senior at [employer]   Enjoys reading   Works as a Marketing executive at [employer] Strain: Not on [employer] Insecurity: Not on file  Transportation Needs: Not on file  Physical Activity: Not on file  Stress: Not on file  Social Connections: Not on file  Intimate Partner Violence: Not on file   Current Outpatient Medications  Medication Sig Dispense Refill   amLODipine (NORVASC) 5 MG tablet TAKE 1 TABLET BY MOUTH EVERY DAY 90 tablet 0    amphetamine-dextroamphetamine (ADDERALL) 10 MG tablet Take 1 tablet (10 mg total) by mouth 2 (two) times daily. 60 tablet 0   cetirizine (ZYRTEC) 10 MG tablet Take 10 mg by mouth daily.     dapagliflozin propanediol (FARXIGA) 10 MG TABS tablet TAKE 1 TABLET BY MOUTH DAILY BEFORE BREAKFAST AS DIRECTED 90 tablet 3   Dulaglutide (TRULICITY) 4.5 SE/8.3TD SOPN Inject 4.5 mg as directed once a week. 6 mL 3   FLUoxetine (PROZAC) 20 MG  capsule TAKE 1 CAPSULE BY MOUTH EVERY DAY 90 capsule 1   insulin aspart (NOVOLOG FLEXPEN) 100 UNIT/ML FlexPen INJECT 15-25 UNITS (DEPENDING ON MEAL SIZE) UNDER THE SKIN 3 TIMES A DAY 60 mL 3   insulin detemir (LEVEMIR FLEXTOUCH) 100 UNIT/ML FlexPen Inject 30 Units into the skin at bedtime. 30 mL 3   Insulin Pen Needle 32G X 4 MM MISC Use to inject insulin 4 times daily 400 each 3   metFORMIN (GLUCOPHAGE-XR) 750 MG 24 hr tablet Take 1 tablet (750 mg total) by mouth 2 (two) times daily. 180 tablet 3   Multiple Vitamin (MULTIVITAMIN PO) Take 1 tablet by mouth daily at 6 (six) AM.     rosuvastatin (CRESTOR) 10 MG tablet TAKE 1 TABLET BY MOUTH EVERY DAY 90 tablet 1   spironolactone (ALDACTONE) 50 MG tablet TAKE 1 TABLET BY MOUTH TWICE A DAY 180 tablet 3   No current facility-administered medications for this visit.    Allergies  Allergen Reactions   Lisinopril Swelling    Swelling of the left side of tongue   Lipitor [Atorvastatin] Other (See Comments)    Myalgia/foot cramping   Family History  Problem Relation Age of Onset   Obesity Mother    Colon polyps Mother    Cancer Mother        breast   Hyperlipidemia Mother    Heart disease Mother    Hypertension Mother    Mental illness Mother        depression   Diabetes Mother    Alcoholism Father    Hyperlipidemia Father    Stroke Father    Hypertension Father    Cancer Maternal Grandmother        colon   Diabetes Maternal Grandmother    Diabetes Maternal Aunt    Glaucoma Maternal Aunt    Colon polyps  Maternal Uncle    Colon cancer Maternal Uncle    Diabetes Maternal Uncle    Diabetes Maternal Uncle    Esophageal cancer Neg Hx    Rectal cancer Neg Hx    Stomach cancer Neg Hx     PE: BP 128/78 (BP Location: Right Arm, Patient Position: Sitting, Cuff Size: Normal)   Pulse 91   Ht 5\' 5"  (1.651 m)   Wt 265 lb (120.2 kg)   SpO2 97%   BMI 44.10 kg/m  Wt Readings from Last 3 Encounters:  06/15/22 265 lb (120.2 kg)  04/13/22 266 lb (120.7 kg)  03/16/22 277 lb (125.6 kg)   Constitutional: overweight, in NAD Eyes:  EOMI, no exophthalmos ENT: no neck masses, no cervical lymphadenopathy Cardiovascular: RRR, No MRG Respiratory: CTA B Musculoskeletal: no deformities Skin:no rashes Neurological: no tremor with outstretched hands  ASSESSMENT: 1. DM2, insulin-dependent, controlled, without long-term complications, but with hyperglycemia  2. PCOS Component     Latest Ref Rng 10/16/2013  Testosterone     10 - 70 ng/dL 54  Sex Hormone Binding     18 - 114 nmol/L 12 (L)  Testosterone Free     0.6 - 6.8 pg/mL 15.5 (H)  Testosterone-% Free     0.4 - 2.4 % 2.9 (H)  TSH     0.35 - 4.50 uIU/mL 0.54  17-OH-Progesterone, LC/MS/MS      <8  Free T4     0.60 - 1.60 ng/dL 10/18/2013  T3, Free     2.3 - 4.2 pg/mL 3.0   Labs confirmed PCOS (high testosterone, low 17-HO Prog, normal TFTs).  LH and  FSH were not tested as she was on the Mirena IUD.  3. HL  PLAN:  1. DM2 - Patient with longstanding, reasonably controlled, type 2 diabetes, on a complex medication regimen including basal/bolus insulin regimen, metformin, SGLT2 inhibitor and weekly GLP-1 receptor agonist.  At last visit she was on the highest dose of Wegovy but this was denied by her insurance and we had to switch back to Trulicity.  At last visit we discussed about improving diet (given several suggestions including stopping snacks after dinner, eating low glycemic index foods, not drinking sweet drinks), etc. but we did not change  her regimen.  HbA1c at last visit was at goal, at 6.3%. -At today's visit, per review of her [place] meter records, sugars appear to be mostly at goal but with hyperglycemic spikes, possibly due to recent dietary indiscretions during her cruise. A Recent HbA1c was reportedly 7.2% last week, increased. -Will try to switch from Trulicity to Ozempic for stronger effect and continue the rest of the regimen.  We discussed that we still need to switch from Levemir to another insulin, since Levemir production will be discontinued. -  I suggested to:  Patient Instructions  Please continue: - Metformin ER 1500 mg with dinner - Farxiga 10 mg in am, before b'fast   - Novolog 12-15 units depending on the size of the meal - Levemir 30 units at bedtime  Try to start: - Ozempic 1 mg weekly  Please return in 4 months with your sugar log or CGM.  - advised to check sugars at different times of the day - 1x a day, rotating check times - advised for yearly eye exams >> she is UTD - return to clinic in 4 months  2. PCOS -Patient with clinically evident PCOS, confirmed biochemically in 2015, now perimenopausal -She was on oral contraceptives (Ortho-Cyclen), tolerated well >> then Nexplanon -She continues on metformin ER and spironolactone.  She had a slightly high potassium in 2019 while on a potassium supplement, which we stopped.  He is also on an SGLT2 inhibitor, which can raise potassium.  Latest K was reviewed and this was normal Lab Results  Component Value Date   K 4.9 11/29/2020  -She decided against weight loss surgery -We previously use Wegovy for her but this was denied by her insurance 01/18/2022 due to lack of effect.  She was previously on Trulicity and we restarted this after Reginal Lutes was denied.  This should also help with weight loss.  However, she recently was able to obtain Ozempic 1 mg weekly, but did not start, pending our appointment.  I strongly encouraged her to switch from Trulicity to  Ozempic, for stronger effect.  3. HL -Reviewed latest lipid panel from 11/2020: fractions at goal: Lab Results  Component Value Date   CHOL 159 11/29/2020   HDL 51 11/29/2020   LDLCALC 90 11/29/2020   TRIG 99 11/29/2020   CHOLHDL 2.9 03/21/2020  -She continues on Crestor 10 mg daily.  She had muscle cramps in the past but resolved after she increased her water intake. -She has an appointment coming up with PCP next month  Carlus Pavlov, MD PhD [facility] Endocrinology

## 2022-06-17 ENCOUNTER — Other Ambulatory Visit: Payer: Self-pay | Admitting: Internal Medicine

## 2022-06-18 ENCOUNTER — Encounter: Payer: Self-pay | Admitting: Internal Medicine

## 2022-06-18 ENCOUNTER — Encounter: Payer: Self-pay | Admitting: Family

## 2022-06-24 ENCOUNTER — Encounter: Payer: Self-pay | Admitting: Internal Medicine

## 2022-06-25 ENCOUNTER — Other Ambulatory Visit: Payer: Self-pay | Admitting: Internal Medicine

## 2022-06-25 MED ORDER — OZEMPIC (2 MG/DOSE) 8 MG/3ML ~~LOC~~ SOPN: 2.0000 mg | PEN_INJECTOR | SUBCUTANEOUS | 3 refills | Status: DC

## 2022-07-02 ENCOUNTER — Other Ambulatory Visit: Payer: Self-pay | Admitting: Family

## 2022-07-02 NOTE — Telephone Encounter (Signed)
Requesting: Adderall 10mg  Contract: Yes  UDS: 03/16/22 Last Visit: 04/13/2022 Next Visit: 07/16/2022 Last Refill: 05/29/22  Please Advise

## 2022-07-03 ENCOUNTER — Other Ambulatory Visit: Payer: Self-pay | Admitting: Family

## 2022-07-03 MED ORDER — AMPHETAMINE-DEXTROAMPHETAMINE 10 MG PO TABS: 10.0000 mg | ORAL_TABLET | Freq: Two times a day (BID) | ORAL | 0 refills | Status: DC

## 2022-07-09 ENCOUNTER — Encounter: Payer: Self-pay | Admitting: Internal Medicine

## 2022-07-16 ENCOUNTER — Ambulatory Visit: Payer: 59 | Admitting: Family

## 2022-07-16 ENCOUNTER — Encounter: Payer: Self-pay | Admitting: Family

## 2022-07-16 ENCOUNTER — Telehealth: Payer: Self-pay | Admitting: Family

## 2022-07-16 VITALS — BP 126/64 | HR 98 | Temp 98.6°F | Resp 16 | Wt 255.0 lb

## 2022-07-16 DIAGNOSIS — I10 Essential (primary) hypertension: Secondary | ICD-10-CM

## 2022-07-16 DIAGNOSIS — E1165 Type 2 diabetes mellitus with hyperglycemia: Secondary | ICD-10-CM

## 2022-07-16 DIAGNOSIS — E785 Hyperlipidemia, unspecified: Secondary | ICD-10-CM | POA: Diagnosis not present

## 2022-07-16 DIAGNOSIS — F902 Attention-deficit hyperactivity disorder, combined type: Secondary | ICD-10-CM

## 2022-07-16 DIAGNOSIS — F32A Depression, unspecified: Secondary | ICD-10-CM | POA: Diagnosis not present

## 2022-07-16 DIAGNOSIS — R232 Flushing: Secondary | ICD-10-CM

## 2022-07-16 DIAGNOSIS — Z1159 Encounter for screening for other viral diseases: Secondary | ICD-10-CM

## 2022-07-16 LAB — LIPID PANEL
Cholesterol: 152 mg/dL (ref 0–200)
HDL: 49.2 mg/dL (ref >39.00)
LDL Cholesterol: 81 mg/dL (ref 0–99)
NonHDL: 102.57
Total CHOL/HDL Ratio: 3
Triglycerides: 109 mg/dL (ref 0.0–149.0)
VLDL: 21.8 mg/dL (ref 0.0–40.0)

## 2022-07-16 LAB — MICROALBUMIN / CREATININE URINE RATIO
Creatinine,U: 144.1 mg/dL
Microalb Creat Ratio: 2 mg/g (ref 0.0–30.0)
Microalb, Ur: 2.9 mg/dL — ABNORMAL HIGH (ref 0.0–1.9)

## 2022-07-16 LAB — FOLLICLE STIMULATING HORMONE: FSH: 5 m[IU]/mL

## 2022-07-16 LAB — LUTEINIZING HORMONE: LH: 13.49 m[IU]/mL

## 2022-07-16 MED ORDER — AMPHETAMINE-DEXTROAMPHET ER 30 MG PO CP24: 30.0000 mg | ORAL_CAPSULE | ORAL | 0 refills | Status: DC

## 2022-07-16 NOTE — Assessment & Plan Note (Signed)
Overall mood is stable.  Job is stressful.  Continue prozac 21m.

## 2022-07-16 NOTE — Assessment & Plan Note (Addendum)
Wt Readings from Last 3 Encounters:  07/16/22 255 lb (115.7 kg)  06/15/22 265 lb (120.2 kg)  04/13/22 266 lb (120.7 kg)   Lab Results  Component Value Date   HGBA1C 6.3 (A) 09/28/2021   HGBA1C 7.0 (A) 05/31/2021   HGBA1C 7.0 (A) 10/14/2020   Lab Results  Component Value Date   MICROALBUR 1.1 03/21/2020   [place] 90 11/29/2020   CREATININE 1.03 (H) 11/29/2020   This is being managed by endocrinology.  She notes some elevated glucose readings at home. Advised her to reach out to endocrinology with her readings.

## 2022-07-16 NOTE — Assessment & Plan Note (Signed)
BP Readings from Last 3 Encounters:  07/16/22 126/64  06/15/22 128/78  04/13/22 128/62   BP stable on amlodipine and aldactone.

## 2022-07-16 NOTE — Telephone Encounter (Signed)
Records release faxed 

## 2022-07-16 NOTE — Telephone Encounter (Signed)
Please call [facility] for women and request copy of mammogram.

## 2022-07-16 NOTE — Assessment & Plan Note (Signed)
Stable on adderall xr 81m twice daily. Has trouble remembering PM dose.  Will change to adderall xr 381monce daily.

## 2022-07-16 NOTE — Assessment & Plan Note (Signed)
Lab Results  Component Value Date   CHOL 159 11/29/2020   HDL 51 11/29/2020   LDLCALC 90 11/29/2020   TRIG 99 11/29/2020   CHOLHDL 2.9 03/21/2020   Tolerating crestor.

## 2022-07-16 NOTE — Progress Notes (Signed)
Subjective:   By signing my name below, I, Audrey Goodman, attest that this documentation has been prepared under the direction and in the presence of Audrey Alar, NP. 07/16/2022   Patient ID: Audrey Goodman, female    DOB: [birth year], 52 y.o.   MRN: [id number]  Chief Complaint  Patient presents with   Hypertension    Here for follow up   Diabetes    Here for follow up   ADHD    Here for follow up, patient reports she is taking 1 1/2 tablets (15 mg)    HPI Patient is in today for a follow-up appointment.   Blood sugar: Her blood sugar is elevated and her fasted blood sugar 130 and measured 167 after eating this morning. She is taking 750 mg metformin 2x daily PO. Her A1C measured 7.2 on 06/07/2022.  Lab Results  Component Value Date   HGBA1C 6.3 (A) 09/28/2021   Cholesterol: She is taking 20 mg Crestor. Lab Results  Component Value Date   CHOL 159 11/29/2020   HDL 51 11/29/2020   LDLCALC 90 11/29/2020   TRIG 99 11/29/2020   CHOLHDL 2.9 03/21/2020   Prozac: Her mood is stable on the Prozac.   Adderall: She has cut back on the adderall and now takes 72m twice daily.   Blood pressure: Blood pressure is within normal range. BP Readings from Last 3 Encounters:  07/16/22 126/64  06/15/22 128/78  04/13/22 128/62   Ozempic: She is currently taking ozempic and does not have any noticeable appetite changes.  Wt Readings from Last 3 Encounters:  07/16/22 255 lb (115.7 kg)  06/15/22 265 lb (120.2 kg)  04/13/22 266 lb (120.7 kg)   Immunzations: She is UTD on both shingles vaccinations.  She is interested in receiving hepatitis C screening during her next blood work.     Mammogram: She is due in June for mammogram and is planning on scheduling an appointment.   Nexplanon: She has nexplanon for birth control. She does not have menstrual cycles while on nexplanon. She is experiencing hot flashes and thinks she may be in menopause.    Past Medical History:  Diagnosis  Date   Anxiety    Arthritis    left shoulder   Back pain    Chronic kidney disease    stage 1   Depression    Diabetes mellitus without complication (HCC)    type 2- on meds   Diverticulitis    High cholesterol    History of chicken pox    Hyperlipidemia    on meds   Hypertension    on meds   Joint pain    Lactose intolerance    PCOS (polycystic ovarian syndrome)    Seasonal allergies    Shingles    Sleep apnea    does NOT use CPAP   SVD (spontaneous vaginal delivery) 1995   x 1   Type 2 diabetes mellitus, uncontrolled 07/30/2006   Qualifier: Diagnosis of  By: BDrue Flirt MD, TMerrily Brittle     Past Surgical History:  Procedure Laterality Date   ANKLE FRACTURE SURGERY  05/28/1989   right   CHOLECYSTECTOMY  05/28/2006   COLONOSCOPY  1989   due to blood in stool and diverticulitis per pt   HYSTEROSCOPY N/A 09/13/2014   Procedure: HYSTEROSCOPY with IUD removal;  Surgeon: MLinda Hedges DO;  [facility];  Service: Gynecology;  Laterality: N/A;  need longest speculum and instruments that aresavailable due to patient's  habitus (BMI ~60)    Family History  Problem Relation Age of Onset   Obesity Mother    Colon polyps Mother    Cancer Mother        breast   Hyperlipidemia Mother    Heart disease Mother    Hypertension Mother    Mental illness Mother        depression   Diabetes Mother    Alcoholism Father    Hyperlipidemia Father    Stroke Father    Hypertension Father    Cancer Maternal Grandmother        colon   Diabetes Maternal Grandmother    Diabetes Maternal Aunt    Glaucoma Maternal Aunt    Colon polyps Maternal Uncle    Colon cancer Maternal Uncle    Diabetes Maternal Uncle    Diabetes Maternal Uncle    Esophageal cancer Neg Hx    Rectal cancer Neg Hx    Stomach cancer Neg Hx     Social History   Socioeconomic History   Marital status: Single    Spouse name: Not on file   Number of children: Not on file   Years of education: Not on file    Highest education level: Not on file  Occupational History   Not on file  Tobacco Use   Smoking status: Never   Smokeless tobacco: Never  Vaping Use   Vaping Use: Never used  Substance and Sexual Activity   Alcohol use: No    Alcohol/week: 0.0 standard drinks of alcohol   Drug use: No   Sexual activity: Yes    Partners: Male    Birth control/protection: Pill  Other Topics Concern   Not on file  Social History Narrative   Single- lives alone   Son 49- senior at [employer]   Enjoys reading   Works as a Marketing executive at [employer] Strain: Not on [employer] Insecurity: Not on file  Transportation Needs: Not on file  Physical Activity: Not on file  Stress: Not on file  Social Connections: Not on file  Intimate Partner Violence: Not on file    Outpatient Medications Prior to Visit  Medication Sig Dispense Refill   amLODipine (NORVASC) 5 MG tablet TAKE 1 TABLET BY MOUTH EVERY DAY 90 tablet 0   cetirizine (ZYRTEC) 10 MG tablet Take 10 mg by mouth daily.     Continuous Blood Gluc Sensor (FREESTYLE LIBRE 3 SENSOR) MISC 1 each by Does not apply route every 14 (fourteen) days. 6 each 3   dapagliflozin propanediol (FARXIGA) 10 MG TABS tablet TAKE 1 TABLET BY MOUTH DAILY BEFORE BREAKFAST AS DIRECTED 90 tablet 3   FLUoxetine (PROZAC) 20 MG capsule TAKE 1 CAPSULE BY MOUTH EVERY DAY 90 capsule 1   insulin aspart (NOVOLOG FLEXPEN) 100 UNIT/ML FlexPen INJECT 15-25 UNITS (DEPENDING ON MEAL SIZE) UNDER THE SKIN 3 TIMES A DAY 60 mL 3   insulin detemir (LEVEMIR FLEXTOUCH) 100 UNIT/ML FlexPen Inject 30 Units into the skin at bedtime. 30 mL 3   Insulin Pen Needle 32G X 4 MM MISC Use to inject insulin 4 times daily 400 each 3   metFORMIN (GLUCOPHAGE-XR) 750 MG 24 hr tablet Take 1 tablet (750 mg total) by mouth 2 (two) times daily. 180 tablet 3   Multiple Vitamin (MULTIVITAMIN PO) Take 1 tablet by mouth daily at 6 (six) AM.     rosuvastatin  (CRESTOR) 10 MG tablet  TAKE 1 TABLET BY MOUTH EVERY DAY 90 tablet 1   Semaglutide, 2 MG/DOSE, (OZEMPIC, 2 MG/DOSE,) 8 MG/3ML SOPN Inject 2 mg into the skin once a week. 9 mL 3   spironolactone (ALDACTONE) 50 MG tablet TAKE 1 TABLET BY MOUTH TWICE A DAY 180 tablet 3   amphetamine-dextroamphetamine (ADDERALL) 10 MG tablet Take 1 tablet (10 mg total) by mouth 2 (two) times daily. 60 tablet 0   No facility-administered medications prior to visit.    Allergies  Allergen Reactions   Lisinopril Swelling    Swelling of the left side of tongue   Lipitor [Atorvastatin] Other (See Comments)    Myalgia/foot cramping    ROS See HPI    Objective:    Physical Exam Constitutional:      General: She is not in acute distress.    Appearance: Normal appearance.  HENT:     Head: Normocephalic and atraumatic.     Right Ear: External ear normal.     Left Ear: External ear normal.     Mouth/Throat:     Mouth: Mucous membranes are moist.  Eyes:     Extraocular Movements: Extraocular movements intact.     Pupils: Pupils are equal, round, and reactive to light.  Cardiovascular:     Rate and Rhythm: Normal rate and regular rhythm.     Heart sounds: Normal heart sounds. No murmur heard.    No gallop.  Pulmonary:     Effort: Pulmonary effort is normal. No respiratory distress.     Breath sounds: Normal breath sounds. No wheezing or rales.  Lymphadenopathy:     Cervical: No cervical adenopathy.  Skin:    General: Skin is warm.  Neurological:     Mental Status: She is alert and oriented to person, place, and time.  Psychiatric:        Judgment: Judgment normal.     BP 126/64 (BP Location: Right Arm, Patient Position: Sitting, Cuff Size: Large)   Pulse 98   Temp 98.6 F (37 C) (Oral)   Resp 16   Wt 255 lb (115.7 kg)   SpO2 100%   BMI 42.43 kg/m  Wt Readings from Last 3 Encounters:  07/16/22 255 lb (115.7 kg)  06/15/22 265 lb (120.2 kg)  04/13/22 266 lb (120.7 kg)       Assessment &  Plan:  Uncontrolled type 2 diabetes mellitus with hyperglycemia (HCC) Assessment & Plan: Wt Readings from Last 3 Encounters:  07/16/22 255 lb (115.7 kg)  06/15/22 265 lb (120.2 kg)  04/13/22 266 lb (120.7 kg)   Lab Results  Component Value Date   HGBA1C 6.3 (A) 09/28/2021   HGBA1C 7.0 (A) 05/31/2021   HGBA1C 7.0 (A) 10/14/2020   Lab Results  Component Value Date   MICROALBUR 1.1 03/21/2020   [place] 90 11/29/2020   CREATININE 1.03 (H) 11/29/2020   This is being managed by endocrinology.  She notes some elevated glucose readings at home. Advised her to reach out to endocrinology with her readings.   Orders: -     Microalbumin / creatinine urine ratio  Depression, unspecified depression type Assessment & Plan: Overall mood is stable.  Job is stressful.  Continue prozac 83m.    Attention deficit hyperactivity disorder, combined type, moderate Assessment & Plan: Stable on adderall xr 130mtwice daily. Has trouble remembering PM dose.  Will change to adderall xr 3034mnce daily.   Orders: -     Amphetamine-Dextroamphet ER; Take 1 capsule (30 mg total)  by mouth every morning.  Dispense: 30 capsule; Refill: 0  Hyperlipidemia, unspecified hyperlipidemia type Assessment & Plan: Lab Results  Component Value Date   CHOL 159 11/29/2020   HDL 51 11/29/2020   LDLCALC 90 11/29/2020   TRIG 99 11/29/2020   CHOLHDL 2.9 03/21/2020   Tolerating crestor.    Orders: -     Lipid panel  HYPERTENSION, BENIGN ESSENTIAL Assessment & Plan: BP Readings from Last 3 Encounters:  07/16/22 126/64  06/15/22 128/78  04/13/22 128/62   BP stable on amlodipine and aldactone.    Hot flashes -     Estradiol -     Follicle stimulating hormone -     Luteinizing hormone  Encounter for hepatitis C screening test for low risk patient -     Hepatitis C antibody    I, Nance Pear, NP, personally preformed the services described in this documentation.  All medical record entries  made by the scribe were at my direction and in my presence.  I have reviewed the chart and discharge instructions (if applicable) and agree that the record reflects my personal performance and is accurate and complete. 07/16/2022   Lacretia Leigh as a scribe for Nance Pear, NP.,have documented all relevant documentation on the behalf of Nance Pear, NP,as directed by  Nance Pear, NP while in the presence of Nance Pear, NP.   Nance Pear, NP

## 2022-07-17 LAB — HEPATITIS C ANTIBODY: Hepatitis C Ab: NONREACTIVE

## 2022-07-17 LAB — ESTRADIOL: Estradiol: 31 pg/mL

## 2022-07-23 ENCOUNTER — Encounter: Payer: Self-pay | Admitting: Family

## 2022-07-30 ENCOUNTER — Encounter: Payer: Self-pay | Admitting: Internal Medicine

## 2022-08-06 ENCOUNTER — Encounter: Payer: Self-pay | Admitting: Internal Medicine

## 2022-08-06 DIAGNOSIS — E1165 Type 2 diabetes mellitus with hyperglycemia: Secondary | ICD-10-CM

## 2022-08-06 MED ORDER — INSULIN PEN NEEDLE 32G X 4 MM MISC: 3 refills | Status: DC

## 2022-08-12 ENCOUNTER — Other Ambulatory Visit: Payer: Self-pay | Admitting: Family

## 2022-08-12 DIAGNOSIS — F902 Attention-deficit hyperactivity disorder, combined type: Secondary | ICD-10-CM

## 2022-08-13 MED ORDER — AMPHETAMINE-DEXTROAMPHET ER 30 MG PO CP24: 30.0000 mg | ORAL_CAPSULE | ORAL | 0 refills | Status: DC

## 2022-08-13 NOTE — Telephone Encounter (Signed)
Requesting: Adderall XR Contract: 03/16/2022 UDS: 03/16/2022 Last Visit: 07/16/2022 Next Visit: 11/14/2022 Last Refill: 07/16/2022  Please Advise

## 2022-08-23 ENCOUNTER — Encounter: Payer: Self-pay | Admitting: Internal Medicine

## 2022-08-27 ENCOUNTER — Other Ambulatory Visit: Payer: Self-pay | Admitting: Internal Medicine

## 2022-08-27 MED ORDER — LANTUS SOLOSTAR 100 UNIT/ML ~~LOC~~ SOPN: 30.0000 [IU] | PEN_INJECTOR | Freq: Every day | SUBCUTANEOUS | 3 refills | Status: DC

## 2022-08-28 ENCOUNTER — Ambulatory Visit: Payer: 59 | Admitting: Behavioral Health

## 2022-08-30 ENCOUNTER — Ambulatory Visit (INDEPENDENT_AMBULATORY_CARE_PROVIDER_SITE_OTHER): Payer: 59 | Admitting: Behavioral Health

## 2022-08-30 DIAGNOSIS — F902 Attention-deficit hyperactivity disorder, combined type: Secondary | ICD-10-CM

## 2022-08-30 DIAGNOSIS — F418 Other specified anxiety disorders: Secondary | ICD-10-CM

## 2022-08-30 NOTE — Progress Notes (Signed)
                Zaydah Nawabi L Maelle Sheaffer, LMFT 

## 2022-08-30 NOTE — Progress Notes (Signed)
[facility] Counselor Initial Adult Exam  Name: Audrey Goodman Date: 08/30/2022 MRN: [id number] DOB: June 18, 1970 PCP: [name], NP  Time spent: 60 min Caregility video; Pt is home in private & Provider is working remotely from [place]:  [place] requested: No   Reason for Visit /Presenting Problem: Elevated anx/dep due to recent life [place] in [place]  Mental Status Exam: Appearance:   Casual     Behavior:  Appropriate and Sharing  Motor:  Normal  Speech/Language:   Clear and Coherent  Affect:  Appropriate  Mood:  normal  Thought process:  normal  Thought content:    WNL  Sensory/Perceptual disturbances:    WNL  Orientation:  oriented to person, place, and time/date  Attention:  Good  Concentration:  Good  Memory:  WNL  Fund of knowledge:   Good  Insight:    Good  Judgment:   Fair  Impulse Control:  Fair    Risk Assessment: Danger to Self:  No Self-injurious Behavior: No Danger to Others: No Duty to Warn:no Physical Aggression / Violence:No  Access to Firearms a concern: No  Gang Involvement:No  Patient / guardian was educated about steps to take if suicide or homicide risk level increases between visits: yes While future psychiatric events cannot be accurately predicted, the patient does not currently require acute inpatient psychiatric care and does not currently meet [facility] involuntary commitment criteria.  Substance Abuse History: Current substance abuse: No     Past Psychiatric History:   Previous psychological history is significant for ADHD, anxiety, and depression Outpatient Providers: [name], NP History of Psych Hospitalization: No  Psychological Testing: Attention/ADHD:  Unk Professional's name and Unk    Abuse History:  Victim of: No.,  NA    Report needed: No. Victim of Neglect:No. Perpetrator of  NA   Witness / Exposure to Domestic Violence: No   Protective Services  Involvement: No  Witness to [employer] Violence:  No   Family History:  Family History  Problem Relation Age of Onset   Obesity Mother    Colon polyps Mother    Cancer Mother        breast   Hyperlipidemia Mother    Heart disease Mother    Hypertension Mother    Mental illness Mother        depression   Diabetes Mother    Alcoholism Father    Hyperlipidemia Father    Stroke Father    Hypertension Father    Cancer Maternal Grandmother        colon   Diabetes Maternal Grandmother    Diabetes Maternal Aunt    Glaucoma Maternal Aunt    Colon polyps Maternal Uncle    Colon cancer Maternal Uncle    Diabetes Maternal Uncle    Diabetes Maternal Uncle    Esophageal cancer Neg Hx    Rectal cancer Neg Hx    Stomach cancer Neg Hx     Living situation: the patient lives alone  Sexual Orientation: Straight  Relationship Status: single  Name of spouse / other: NA If a parent, number of children / ages: Son Jeneen Rinks who is 11yo  Support Systems: friends Co-Workers  Museum/gallery curator Stress:  Yes   Income/Employment/Disability: 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 History of legal issue / charges:  NA  Medical History/Surgical History: reviewed Past Medical History:  Diagnosis Date   Anxiety    Arthritis    left shoulder   Back pain    Chronic kidney disease    stage 1   Depression    Diabetes mellitus without complication (HCC)    type 2- on meds   Diverticulitis    High  cholesterol    History of chicken pox    Hyperlipidemia    on meds   Hypertension    on meds   Joint pain    Lactose intolerance    PCOS (polycystic ovarian syndrome)    Seasonal allergies    Shingles    Sleep apnea    does NOT use CPAP   SVD (spontaneous vaginal delivery) 1995   x 1   Type 2 diabetes mellitus, uncontrolled 07/30/2006   Qualifier: Diagnosis of  By: Drue Flirt  MD, Merrily Brittle      Past Surgical History:  Procedure Laterality Date   ANKLE FRACTURE SURGERY  05/28/1989   right   CHOLECYSTECTOMY  05/28/2006   COLONOSCOPY  1989   due to blood in stool and diverticulitis per pt   HYSTEROSCOPY N/A 09/13/2014   Procedure: HYSTEROSCOPY with IUD removal;  Surgeon: Linda Hedges, DO;  Location: [facility];  Service: Gynecology;  Laterality: N/A;  need longest speculum and instruments that aresavailable due to patient's habitus (BMI ~60)    Medications: Current Outpatient Medications  Medication Sig Dispense Refill   insulin glargine (LANTUS SOLOSTAR) 100 UNIT/ML Solostar Pen Inject 30 Units into the skin daily. 30 mL 3   amLODipine (NORVASC) 5 MG tablet TAKE 1 TABLET BY MOUTH EVERY DAY 90 tablet 0   amphetamine-dextroamphetamine (ADDERALL XR) 30 MG 24 hr capsule Take 1 capsule (30 mg total) by mouth every morning. 30 capsule 0   cetirizine (ZYRTEC) 10 MG tablet Take 10 mg by mouth daily.     Continuous Blood Gluc Sensor (FREESTYLE LIBRE 3 SENSOR) MISC 1 each by Does not apply route every 14 (fourteen) days. 6 each 3   dapagliflozin propanediol (FARXIGA) 10 MG TABS tablet TAKE 1 TABLET BY MOUTH DAILY BEFORE BREAKFAST AS DIRECTED 90 tablet 3   FLUoxetine (PROZAC) 20 MG capsule TAKE 1 CAPSULE BY MOUTH EVERY DAY 90 capsule 1   insulin aspart (NOVOLOG FLEXPEN) 100 UNIT/ML FlexPen INJECT 15-25 UNITS (DEPENDING ON MEAL SIZE) UNDER THE SKIN 3 TIMES A DAY 60 mL 3   insulin detemir (LEVEMIR FLEXTOUCH) 100 UNIT/ML FlexPen Inject 30 Units into the skin at bedtime. 30 mL 3   Insulin Pen  Needle 32G X 4 MM MISC Use to inject insulin 4 times daily 400 each 3   metFORMIN (GLUCOPHAGE-XR) 750 MG 24 hr tablet Take 1 tablet (750 mg total) by mouth 2 (two) times daily. 180 tablet 3   Multiple Vitamin (MULTIVITAMIN PO) Take 1 tablet by mouth daily at 6 (six) AM.     rosuvastatin (CRESTOR) 10 MG tablet TAKE 1 TABLET BY MOUTH EVERY DAY 90 tablet 1   Semaglutide, 2 MG/DOSE, (OZEMPIC, 2 MG/DOSE,) 8 MG/3ML SOPN Inject 2 mg into the skin once a week. 9 mL 3   spironolactone (ALDACTONE) 50 MG tablet TAKE  1 TABLET BY MOUTH TWICE A DAY 180 tablet 3   No current facility-administered medications for this visit.    Allergies  Allergen Reactions   Lisinopril Swelling    Swelling of the left side of tongue   Lipitor [Atorvastatin] Other (See Comments)    Myalgia/foot cramping    Diagnoses:  Attention deficit hyperactivity disorder (ADHD), combined type, moderate  Depression with anxiety  Plan of Care: Lauraann will initiate an exercise prog that will engage her in healthy-small steps to embed her habits. She will initiate the fllw'g habits:  -Keep dishes done  -Say no to self re: risky money decisions  -Walk, drink water, & eat healthier  Target Date: 10/10/2022  Progress: 3  Frequency: Twice monthly  Modality: Boykin Reaper, LMFT

## 2022-09-09 ENCOUNTER — Other Ambulatory Visit: Payer: Self-pay | Admitting: Family

## 2022-09-11 ENCOUNTER — Encounter: Payer: Self-pay | Admitting: Family

## 2022-09-11 ENCOUNTER — Ambulatory Visit (INDEPENDENT_AMBULATORY_CARE_PROVIDER_SITE_OTHER): Payer: 59 | Admitting: Behavioral Health

## 2022-09-11 DIAGNOSIS — F902 Attention-deficit hyperactivity disorder, combined type: Secondary | ICD-10-CM | POA: Diagnosis not present

## 2022-09-11 DIAGNOSIS — F418 Other specified anxiety disorders: Secondary | ICD-10-CM

## 2022-09-11 NOTE — Progress Notes (Addendum)
[facility] Counselor/Therapist Progress Note  Patient ID: Audrey Goodman, MRN: [id number],    Date: 09/11/2022  Time Spent: 55 min Caregility video; Pt is travelling in her car & Provider working remotely from [employer]. It is unclear if Pt is driving or not w/her Son in the car. She is now clearly heard & has stopped to do the appt. She desires to stop gambling today & quit her impulsive spending.   Treatment Type: Individual Therapy  Reported Symptoms: Elevated anx/dep & stress due to visit w/Son @ [employer] for 4-5 days  Mental Status Exam: Appearance:  Casual     Behavior: Appropriate and Sharing  Motor: Normal  Speech/Language:  Clear and Coherent  Affect: Appropriate  Mood: normal  Thought process: normal  Thought content:   WNL  Sensory/Perceptual disturbances:   WNL  Orientation: oriented to person, place, and time/date  Attention: Good  Concentration: Good  Memory: WNL  Fund of knowledge:  Good  Insight:   Good  Judgment:  Good  Impulse Control: Good   Risk Assessment: Danger to Self:  No Self-injurious Behavior: No Danger to Others: No Duty to Warn:no Physical Aggression / Violence:No  Access to Firearms a concern: No  Gang Involvement:No   Subjective: Pt is excited for the recent trip to [place] w/29yo Son Audrey Goodman over the past 4-5 days. Pt suspects ASD for her Son.    Interventions: Family Systems  Diagnosis:Depression with anxiety  Attention deficit hyperactivity disorder (ADHD), combined type, moderate  Plan: Audrey Goodman is not worried for her Son Audrey Goodman who has just turned 29yo. She is trying hard to adjust back to work this month. She is trying to recover from bad situations w/money this month & admits she is an impulsive spender. She has curtailed her spending this month.  Target Date: 10/25/2022  Progress: 4  Frequency: Once every 3-4 wks  Modality: Anicka Stuckert will create a routine for her wknds & ask [name], NP about medication  alternatives to Adderall for her ADHD.  Target Date: 10/25/2022  Progress: 3  Frequency: Once every 3-4 wks  Modality: Claretta Fraise, LMFT

## 2022-09-11 NOTE — Progress Notes (Signed)
                Lakiesha Ralphs L Breeonna Mone, LMFT 

## 2022-09-12 ENCOUNTER — Encounter: Payer: Self-pay | Admitting: Family

## 2022-09-12 ENCOUNTER — Telehealth (INDEPENDENT_AMBULATORY_CARE_PROVIDER_SITE_OTHER): Payer: 59 | Admitting: Family

## 2022-09-12 DIAGNOSIS — F32A Depression, unspecified: Secondary | ICD-10-CM | POA: Diagnosis not present

## 2022-09-12 DIAGNOSIS — F902 Attention-deficit hyperactivity disorder, combined type: Secondary | ICD-10-CM | POA: Diagnosis not present

## 2022-09-12 NOTE — Assessment & Plan Note (Signed)
I am concerned however about her complaints about rapid speech with her coworkers and her financial impulsivity that she could be exhibiting some behaviors of Bipolar Disorder. I advised her that I think it would be best if she made an appointment with psychiatry for further evaluation.

## 2022-09-12 NOTE — Progress Notes (Signed)
MyChart Video Visit    Virtual Visit via Video Note   This visit type was conducted due to national recommendations for restrictions regarding the COVID-19 Pandemic (e.g. social distancing) in an effort to limit this patient's exposure and mitigate transmission in our community. This patient is at least at moderate risk for complications without adequate follow up. This format is felt to be most appropriate for this patient at this time. Physical exam was limited by quality of the video and audio technology used for the visit. CMA was able to get the patient set up on a video visit.  Patient location: Home Patient and provider in visit Provider location: Office  I discussed the limitations of evaluation and management by telemedicine and the availability of in person appointments. The patient expressed understanding and agreed to proceed.  Visit Date: 09/12/2022  Today's healthcare provider: Lemont Fillers, NP     Subjective:    Patient ID: Audrey Goodman, female    DOB: December 25, 1970, 52 y.o.   MRN: [id number]  Chief Complaint  Patient presents with   ADHD    Follow up, has questions about the medication    HPI Patient is in today for a video visit.   ADHD: She is taking 30 mg Adderall daily PO and she is able to focus on it, but she is experiencing increased financial impulsivity and lip biting while taking it. The impulsivity is increasing her stress levels as well. During the day, she feels overly energetic at times at work. She is experiencing anxiety, but denies depression at this time. She does not drink often or use drugs. She is unsure if there is a history of bipolar disorder in her family.   Past Medical History:  Diagnosis Date   Anxiety    Arthritis    left shoulder   Back pain    Chronic kidney disease    stage 1   Depression    Diabetes mellitus without complication (HCC)    type 2- on meds   Diverticulitis    High cholesterol    History of chicken  pox    Hyperlipidemia    on meds   Hypertension    on meds   Joint pain    Lactose intolerance    PCOS (polycystic ovarian syndrome)    Seasonal allergies    Shingles    Sleep apnea    does NOT use CPAP   SVD (spontaneous vaginal delivery) 1995   x 1   Type 2 diabetes mellitus, uncontrolled 07/30/2006   Qualifier: Diagnosis of  By: Lanier Prude  MD, Cathrine Muster      Past Surgical History:  Procedure Laterality Date   ANKLE FRACTURE SURGERY  05/28/1989   right   CHOLECYSTECTOMY  05/28/2006   COLONOSCOPY  1989   due to blood in stool and diverticulitis per pt   HYSTEROSCOPY N/A 09/13/2014   Procedure: HYSTEROSCOPY with IUD removal;  Surgeon: Mitchel Honour, DO;  Location: [facility];  Service: Gynecology;  Laterality: N/A;  need longest speculum and instruments that aresavailable due to patient's habitus (BMI ~60)    Family History  Problem Relation Age of Onset   Obesity Mother    Colon polyps Mother    Cancer Mother        breast   Hyperlipidemia Mother    Heart disease Mother    Hypertension Mother    Mental illness Mother        depression   Diabetes Mother  Alcoholism Father    Hyperlipidemia Father    Stroke Father    Hypertension Father    Cancer Maternal Grandmother        colon   Diabetes Maternal Grandmother    Diabetes Maternal Aunt    Glaucoma Maternal Aunt    Colon polyps Maternal Uncle    Colon cancer Maternal Uncle    Diabetes Maternal Uncle    Diabetes Maternal Uncle    Esophageal cancer Neg Hx    Rectal cancer Neg Hx    Stomach cancer Neg Hx     Social History   Socioeconomic History   Marital status: Single    Spouse name: Not on file   Number of children: Not on file   Years of education: Not on file   Highest education level: Not on file  Occupational History   Not on file  Tobacco Use   Smoking status: Never   Smokeless tobacco: Never  Vaping Use   Vaping Use: Never used  Substance and Sexual Activity   Alcohol use: No     Alcohol/week: 0.0 standard drinks of alcohol   Drug use: No   Sexual activity: Yes    Partners: Male    Birth control/protection: Pill  Other Topics Concern   Not on file  Social History Narrative   Single- lives alone   Son 21- senior at [employer]   Enjoys reading   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 30 capsule 0   cetirizine (ZYRTEC) 10 MG tablet Take 10 mg by mouth daily.     Continuous Blood Gluc Sensor (FREESTYLE LIBRE 3 SENSOR) MISC 1 each by Does not apply route every 14 (fourteen) days. 6 each 3   dapagliflozin propanediol (FARXIGA) 10 MG TABS tablet TAKE 1 TABLET BY MOUTH DAILY BEFORE BREAKFAST AS DIRECTED 90 tablet 3   FLUoxetine (PROZAC) 20 MG capsule TAKE 1 CAPSULE BY MOUTH EVERY DAY 90 capsule 1   insulin aspart (NOVOLOG FLEXPEN) 100 UNIT/ML FlexPen INJECT 15-25 UNITS (DEPENDING ON MEAL SIZE) UNDER THE SKIN 3 TIMES A DAY 60 mL 3   insulin glargine (LANTUS SOLOSTAR) 100 UNIT/ML Solostar Pen Inject 30 Units into the skin daily. 30 mL 3   Insulin Pen Needle 32G X 4 MM MISC Use to inject insulin 4 times daily 400 each 3   metFORMIN (GLUCOPHAGE-XR) 750 MG 24 hr tablet Take 1 tablet (750 mg total) by mouth 2 (two) times daily. 180 tablet 3   Multiple Vitamin (MULTIVITAMIN PO) Take 1 tablet by mouth daily at 6 (six) AM.     rosuvastatin (CRESTOR) 10 MG tablet TAKE 1 TABLET BY MOUTH EVERY DAY 90 tablet 1   Semaglutide, 2 MG/DOSE, (OZEMPIC, 2  MG/DOSE,) 8 MG/3ML SOPN Inject 2 mg into the skin once a week. 9 mL 3   spironolactone (ALDACTONE) 50 MG tablet TAKE 1 TABLET BY MOUTH TWICE A DAY 180 tablet  3   insulin detemir (LEVEMIR FLEXTOUCH) 100 UNIT/ML FlexPen Inject 30 Units into the skin at bedtime. 30 mL 3   No facility-administered medications prior to visit.    Allergies  Allergen Reactions   Lisinopril Swelling    Swelling of the left side of tongue   Lipitor [Atorvastatin] Other (See Comments)    Myalgia/foot cramping    Review of Systems  Psychiatric/Behavioral:  Negative for depression. The patient is nervous/anxious.        Objective:    Physical Exam  There were no vitals taken for this visit. Wt Readings from Last 3 Encounters:  07/16/22 255 lb (115.7 kg)  06/15/22 265 lb (120.2 kg)  04/13/22 266 lb (120.7 kg)   Gen: Awake, alert, no acute distress Resp: Breathing is even and non-labored Psych: calm/pleasant demeanor Neuro: Alert and Oriented x 3, + facial symmetry, speech is clear.     Assessment & Plan:  Attention deficit hyperactivity disorder, combined type, moderate Assessment & Plan: She notes some improvement in her focus during the day.  Her focus seems to wane in the afternoon however.  She has some  regular release tabs on hand and I advised her to try cutting those in half and taking 1/2 in the afternoon as needed.  She will let me know how she does with this. If she finds it helpful I will send in  tabs for prn use.    Depression, unspecified depression type Assessment & Plan: I am concerned however about her complaints about rapid speech with her coworkers and her financial impulsivity that she could be exhibiting some behaviors of Bipolar Disorder. I advised her that I think it would be best if she made an appointment with psychiatry for further evaluation.       I discussed the assessment and treatment plan with the patient. The patient was provided an opportunity to ask  questions and all were answered. The patient agreed with the plan and demonstrated an understanding of the instructions.   The patient was advised to call back or seek an in-person evaluation if the symptoms worsen or if the condition fails to improve as anticipated.  Lemont Fillers, NP [facility] [phone] (phone) [phone] (fax)  [facility]   Mercer Pod as a scribe for Lemont Fillers, NP.,have documented all relevant documentation on the behalf of Lemont Fillers, NP,as directed by  Lemont Fillers, NP while in the presence of Lemont Fillers, NP.

## 2022-09-12 NOTE — Assessment & Plan Note (Addendum)
She notes some improvement in her focus during the day.  Her focus seems to wane in the afternoon however.  She has some  regular release tabs on hand and I advised her to try cutting those in half and taking 1/2 in the afternoon as needed.  She will let me know how she does with this. If she finds it helpful I will send in  tabs for prn use.

## 2022-09-12 NOTE — Patient Instructions (Signed)
Please contact psychiatry to arrange an appointment:  Psychiatric Services:  [facility]) [phone] [facility] [place]) - [phone] Dr. Milagros Evener [facility]) [phone] [facility]) - [phone] [facility], [address] [place], [place] [phone]

## 2022-09-13 MED ORDER — AMPHETAMINE-DEXTROAMPHET ER 30 MG PO CP24
30.0000 mg | ORAL_CAPSULE | ORAL | 0 refills | Status: DC
Start: 2022-09-13 — End: 2022-10-08

## 2022-09-25 ENCOUNTER — Ambulatory Visit (INDEPENDENT_AMBULATORY_CARE_PROVIDER_SITE_OTHER): Payer: 59 | Admitting: Behavioral Health

## 2022-09-25 DIAGNOSIS — F902 Attention-deficit hyperactivity disorder, combined type: Secondary | ICD-10-CM

## 2022-09-25 DIAGNOSIS — F418 Other specified anxiety disorders: Secondary | ICD-10-CM | POA: Diagnosis not present

## 2022-09-25 NOTE — Progress Notes (Addendum)
[facility] Counselor/Therapist Progress Note  Patient ID: Audrey Goodman, MRN: [id number],    Date: 09/25/2022  Time Spent: 55 min Caregility video; Pt is home in private & Provider working remotely from [facility] Office   Treatment Type: Individual Therapy  Reported Symptoms: Reduction in overall Sx of anx/dep  Mental Status Exam: Appearance:  Casual     Behavior: Appropriate and Sharing  Motor: Normal  Speech/Language:  Clear and Coherent  Affect: Appropriate  Mood: normal  Thought process: normal  Thought content:   WNL  Sensory/Perceptual disturbances:   WNL  Orientation: oriented to person, place, and time/date  Attention: Good  Concentration: Good  Memory: WNL  Fund of knowledge:  Good  Insight:   Good  Judgment:  Good  Impulse Control: Fair   Risk Assessment: Danger to Self:  No Self-injurious Behavior: No Danger to Others: No Duty to Warn:no Physical Aggression / Violence:No  Access to Firearms a concern: No  Gang Involvement:No   Subjective: Pt is concerned for her 29yo Son Audrey Goodman' presentation of Sx that seem like ADHD on the ASD Spectrum. She worries for him due to her age & his dvlpmt, although he seems to be doing well.   Pt is having a painful hip today due to her actions over the wknd moving things.    Interventions: Cognitive Behavioral Therapy and Psycho-education/Bibliotherapy  Diagnosis:Depression with anxiety  Attention deficit hyperactivity disorder (ADHD), combined type, moderate  Plan: Audrey Goodman is stressed due to her current financial situation. She is trying to catch up by the end of May w/the 3 paycks that will be coming into the home. Her ST Plan is fixed & she wants to follow through. Her Accountability Partner is a Animator @ work. They are supportive of ea other, she wants to make sure she follows her Plan.   Target Date: 10/25/2022  Progress: 3  Frequency: Once every 3-4 wks  Modality: Audrey Goodman is wanting to stop  self-sabotaging her own efforts & access her will power to promote her goals. We will discuss this next session. Until that time, she will use the Wise Mind exercise & record her challenges & successes.  Target Date: 10/25/2022  Progress: 0  Frequency: Once every 3-4 wks  Modality: Audrey Fraise, LMFT

## 2022-09-25 NOTE — Progress Notes (Signed)
                Chalsey Leeth L Nastasha Reising, LMFT 

## 2022-09-28 ENCOUNTER — Other Ambulatory Visit: Payer: Self-pay | Admitting: Family

## 2022-10-08 ENCOUNTER — Other Ambulatory Visit: Payer: Self-pay | Admitting: Family

## 2022-10-08 DIAGNOSIS — F902 Attention-deficit hyperactivity disorder, combined type: Secondary | ICD-10-CM

## 2022-10-08 MED ORDER — AMPHETAMINE-DEXTROAMPHET ER 30 MG PO CP24
30.0000 mg | ORAL_CAPSULE | ORAL | 0 refills | Status: DC
Start: 2022-10-08 — End: 2022-11-11

## 2022-10-08 NOTE — Telephone Encounter (Signed)
Requesting: Adderall XR 30mg   Contract: 03/16/22 UDS: 03/16/22 Last Visit: 09/12/22 Next Visit: 11/14/22 Last Refill: 09/13/22 #30 and 0RF   Please Advise

## 2022-10-10 ENCOUNTER — Other Ambulatory Visit: Payer: Self-pay | Admitting: Internal Medicine

## 2022-10-10 DIAGNOSIS — E1165 Type 2 diabetes mellitus with hyperglycemia: Secondary | ICD-10-CM

## 2022-10-13 ENCOUNTER — Telehealth: Payer: 59 | Admitting: Nurse Practitioner

## 2022-10-13 ENCOUNTER — Encounter: Payer: Self-pay | Admitting: Family

## 2022-10-13 DIAGNOSIS — M25552 Pain in left hip: Secondary | ICD-10-CM | POA: Diagnosis not present

## 2022-10-13 MED ORDER — NAPROXEN 500 MG PO TABS: 500.0000 mg | ORAL_TABLET | Freq: Two times a day (BID) | ORAL | 1 refills | Status: DC

## 2022-10-13 MED ORDER — CYCLOBENZAPRINE HCL 10 MG PO TABS: 10.0000 mg | ORAL_TABLET | Freq: Three times a day (TID) | ORAL | 1 refills | Status: DC | PRN

## 2022-10-13 NOTE — Patient Instructions (Signed)
Chauncey Cruel, thank you for joining Bennie Pierini, FNP for today's virtual visit.  While this provider is not your primary care provider (PCP), if your PCP is located in our provider database this encounter information will be shared with them immediately following your visit.   A [facility] MyChart account gives you access to today's visit and all your visits, tests, and labs performed at [facility] " click here if you don't have a [facility] MyChart account or go to [email or web address]  Consent: (Patient) Audrey Goodman provided verbal consent for this virtual visit at the beginning of the encounter.  Current Medications:  Current Outpatient Medications:    cyclobenzaprine (FLEXERIL) 10 MG tablet, Take 1 tablet (10 mg total) by mouth 3 (three) times daily as needed for muscle spasms., Disp: 30 tablet, Rfl: 1   naproxen (NAPROSYN) 500 MG tablet, Take 1 tablet (500 mg total) by mouth 2 (two) times daily with a meal., Disp: 60 tablet, Rfl: 1   amLODipine (NORVASC) 5 MG tablet, TAKE 1 TABLET BY MOUTH EVERY DAY, Disp: 90 tablet, Rfl: 0   amphetamine-dextroamphetamine (ADDERALL XR) 30 MG 24 hr capsule, Take 1 capsule (30 mg total) by mouth every morning., Disp: 30 capsule, Rfl: 0   cetirizine (ZYRTEC) 10 MG tablet, Take 10 mg by mouth daily., Disp: , Rfl:    Continuous Blood Gluc Sensor (FREESTYLE LIBRE 3 SENSOR) MISC, 1 each by Does not apply route every 14 (fourteen) days., Disp: 6 each, Rfl: 3   dapagliflozin propanediol (FARXIGA) 10 MG TABS tablet, TAKE 1 TABLET BY MOUTH DAILY BEFORE BREAKFAST AS DIRECTED, Disp: 90 tablet, Rfl: 3   FLUoxetine (PROZAC) 20 MG capsule, TAKE 1 CAPSULE BY MOUTH EVERY DAY, Disp: 90 capsule, Rfl: 1   insulin aspart (NOVOLOG FLEXPEN) 100 UNIT/ML FlexPen, INJECT 12-15 UNITS (DEPENDING ON MEAL SIZE) UNDER THE SKIN 3 TIMES A DAY, Disp: 30 mL, Rfl: 0   insulin glargine (LANTUS SOLOSTAR) 100 UNIT/ML Solostar Pen, Inject 30 Units into the skin  daily., Disp: 30 mL, Rfl: 3   Insulin Pen Needle 32G X 4 MM MISC, Use to inject insulin 4 times daily, Disp: 400 each, Rfl: 3   metFORMIN (GLUCOPHAGE-XR) 750 MG 24 hr tablet, Take 1 tablet (750 mg total) by mouth 2 (two) times daily., Disp: 180 tablet, Rfl: 3   Multiple Vitamin (MULTIVITAMIN PO), Take 1 tablet by mouth daily at 6 (six) AM., Disp: , Rfl:    rosuvastatin (CRESTOR) 10 MG tablet, TAKE 1 TABLET BY MOUTH EVERY DAY, Disp: 90 tablet, Rfl: 1   Semaglutide, 2 MG/DOSE, (OZEMPIC, 2 MG/DOSE,) 8 MG/3ML SOPN, Inject 2 mg into the skin once a week., Disp: 9 mL, Rfl: 3   spironolactone (ALDACTONE) 50 MG tablet, TAKE 1 TABLET BY MOUTH TWICE A DAY, Disp: 180 tablet, Rfl: 3   Medications ordered in this encounter:  Meds ordered this encounter  Medications   naproxen (NAPROSYN) 500 MG tablet    Sig: Take 1 tablet (500 mg total) by mouth 2 (two) times daily with a meal.    Dispense:  60 tablet    Refill:  1    Order Specific Question:   Supervising Provider    Answer:   Merrilee Jansky [[id number]]   cyclobenzaprine (FLEXERIL) 10 MG tablet    Sig: Take 1 tablet (10 mg total) by mouth 3 (three) times daily as needed for muscle spasms.    Dispense:  30 tablet    Refill:  1  Order Specific Question:   Supervising Provider    Answer:   Merrilee Jansky [[id number]]     *If you need refills on other medications prior to your next appointment, please contact your pharmacy*  Follow-Up: Call back or seek an in-person evaluation if the symptoms worsen or if the condition fails to improve as anticipated.  [facility] [phone]  Other Instructions Moist heat Rest   If you have been instructed to have an in-person evaluation today at a local Urgent Care facility, please use the link below. It will take you to a list of all of our available [facility], including address, phone number and hours of operation. Please do not delay care.  [facility]  If  you or a family member do not have a primary care provider, use the link below to schedule a visit and establish care. When you choose a [facility] primary care physician or advanced practice provider, you gain a long-term partner in health. Find a Primary Care Provider  Learn more about [facility]'s in-office and virtual care options: [facility] - Get Care Now

## 2022-10-13 NOTE — Progress Notes (Signed)
Virtual Visit Consent   Audrey Goodman, you are scheduled for a virtual visit with Mary-Margaret Daphine Deutscher, FNP, a [facility] provider, today.     Just as with appointments in the office, your consent must be obtained to participate.  Your consent will be active for this visit and any virtual visit you may have with one of our providers in the next 365 days.     If you have a MyChart account, a copy of this consent can be sent to you electronically.  All virtual visits are billed to your insurance company just like a traditional visit in the office.    As this is a virtual visit, video technology does not allow for your provider to perform a traditional examination.  This may limit your provider's ability to fully assess your condition.  If your provider identifies any concerns that need to be evaluated in person or the need to arrange testing (such as labs, EKG, etc.), we will make arrangements to do so.     Although advances in technology are sophisticated, we cannot ensure that it will always work on either your end or our end.  If the connection with a video visit is poor, the visit may have to be switched to a telephone visit.  With either a video or telephone visit, we are not always able to ensure that we have a secure connection.     I need to obtain your verbal consent now.   Are you willing to proceed with your visit today? YES   Audrey Goodman has provided verbal consent on 10/13/2022 for a virtual visit (video or telephone).   Mary-Margaret Daphine Deutscher, FNP   Date: 10/13/2022 5:08 PM   Virtual Visit via Video Note   I, Mary-Margaret Daphine Deutscher, connected with Audrey Goodman ([id number], 07-Feb-1971) on 10/13/22 at  5:00 PM EDT by a video-enabled telemedicine application and verified that I am speaking with the correct person using two identifiers.  Location: Patient: Virtual Visit Location Patient: Home Provider: Virtual Visit Location Provider: Mobile   I discussed the limitations of  evaluation and management by telemedicine and the availability of in person appointments. The patient expressed understanding and agreed to proceed.    History of Present Illness: Audrey Goodman is a 52 y.o. who identifies as a female who was assigned female at birth, and is being seen today for left hip pain.  HPI: Hip Pain  The incident occurred more than 1 week ago. The incident occurred at home. The injury mechanism was a twisting injury. The pain is present in the left hip. The quality of the pain is described as cramping, shooting and stabbing. The pain is at a severity of 7/10. The pain has been Intermittent since onset. Associated symptoms include an inability to bear weight and a loss of motion. Pertinent negatives include no loss of sensation or muscle weakness. She has tried heat for the symptoms. The treatment provided mild relief.    Review of Systems  Constitutional: Negative.   Respiratory: Negative.    Cardiovascular: Negative.   Neurological: Negative.   Psychiatric/Behavioral: Negative.      Problems:  Patient Active Problem List   Diagnosis Date Noted   Attention deficit hyperactivity disorder, combined type, moderate 03/01/2022   Impacted cerumen of both ears 01/23/2021   Left Achilles tendinitis 06/19/2017   Depression 07/13/2015   Preventative health care 06/23/2014   PCOS (polycystic ovarian syndrome) 09/10/2013   CHOLELITHIASIS 02/20/2008   Hyperlipidemia 08/06/2006  Uncontrolled diabetes mellitus with hyperglycemia (HCC) 07/30/2006   HYPERTENSION, BENIGN ESSENTIAL 07/30/2006   MORBID OBESITY 07/25/2006    Allergies:  Allergies  Allergen Reactions   Lisinopril Swelling    Swelling of the left side of tongue   Lipitor [Atorvastatin] Other (See Comments)    Myalgia/foot cramping   Medications:  Current Outpatient Medications:    amLODipine (NORVASC) 5 MG tablet, TAKE 1 TABLET BY MOUTH EVERY DAY, Disp: 90 tablet, Rfl: 0   amphetamine-dextroamphetamine  (ADDERALL XR) 30 MG 24 hr capsule, Take 1 capsule (30 mg total) by mouth every morning., Disp: 30 capsule, Rfl: 0   cetirizine (ZYRTEC) 10 MG tablet, Take 10 mg by mouth daily., Disp: , Rfl:    Continuous Blood Gluc Sensor (FREESTYLE LIBRE 3 SENSOR) MISC, 1 each by Does not apply route every 14 (fourteen) days., Disp: 6 each, Rfl: 3   dapagliflozin propanediol (FARXIGA) 10 MG TABS tablet, TAKE 1 TABLET BY MOUTH DAILY BEFORE BREAKFAST AS DIRECTED, Disp: 90 tablet, Rfl: 3   FLUoxetine (PROZAC) 20 MG capsule, TAKE 1 CAPSULE BY MOUTH EVERY DAY, Disp: 90 capsule, Rfl: 1   insulin aspart (NOVOLOG FLEXPEN) 100 UNIT/ML FlexPen, INJECT 12-15 UNITS (DEPENDING ON MEAL SIZE) UNDER THE SKIN 3 TIMES A DAY, Disp: 30 mL, Rfl: 0   insulin glargine (LANTUS SOLOSTAR) 100 UNIT/ML Solostar Pen, Inject 30 Units into the skin daily., Disp: 30 mL, Rfl: 3   Insulin Pen Needle 32G X 4 MM MISC, Use to inject insulin 4 times daily, Disp: 400 each, Rfl: 3   metFORMIN (GLUCOPHAGE-XR) 750 MG 24 hr tablet, Take 1 tablet (750 mg total) by mouth 2 (two) times daily., Disp: 180 tablet, Rfl: 3   Multiple Vitamin (MULTIVITAMIN PO), Take 1 tablet by mouth daily at 6 (six) AM., Disp: , Rfl:    rosuvastatin (CRESTOR) 10 MG tablet, TAKE 1 TABLET BY MOUTH EVERY DAY, Disp: 90 tablet, Rfl: 1   Semaglutide, 2 MG/DOSE, (OZEMPIC, 2 MG/DOSE,) 8 MG/3ML SOPN, Inject 2 mg into the skin once a week., Disp: 9 mL, Rfl: 3   spironolactone (ALDACTONE) 50 MG tablet, TAKE 1 TABLET BY MOUTH TWICE A DAY, Disp: 180 tablet, Rfl: 3  Observations/Objective: Patient is well-developed, well-nourished in no acute distress.  Resting comfortably  at home.  Head is normocephalic, atraumatic.  No labored breathing.  Speech is clear and coherent with logical content.  Patient is alert and oriented at baseline.  Decreased ROM of left hip with pain standing and abduction  Assessment and Plan:  Audrey Goodman in today with chief complaint of Hip Pain   1. Pain  of left hip Moist heat Rest  Meds ordered this encounter  Medications   naproxen (NAPROSYN) 500 MG tablet    Sig: Take 1 tablet (500 mg total) by mouth 2 (two) times daily with a meal.    Dispense:  60 tablet    Refill:  1    Order Specific Question:   Supervising Provider    Answer:   Merrilee Jansky [[id number]]   cyclobenzaprine (FLEXERIL) 10 MG tablet    Sig: Take 1 tablet (10 mg total) by mouth 3 (three) times daily as needed for muscle spasms.    Dispense:  30 tablet    Refill:  1    Order Specific Question:   Supervising Provider    Answer:   Merrilee Jansky [id number]       Follow Up Instructions: I discussed the assessment and treatment plan with the  patient. The patient was provided an opportunity to ask questions and all were answered. The patient agreed with the plan and demonstrated an understanding of the instructions.  A copy of instructions were sent to the patient via MyChart.  The patient was advised to call back or seek an in-person evaluation if the symptoms worsen or if the condition fails to improve as anticipated.  Time:  I spent 6 minutes with the patient via telehealth technology discussing the above problems/concerns.    Mary-Margaret Daphine Deutscher, FNP

## 2022-10-15 ENCOUNTER — Ambulatory Visit: Payer: 59 | Admitting: Internal Medicine

## 2022-10-15 NOTE — Telephone Encounter (Signed)
noted 

## 2022-10-15 NOTE — Progress Notes (Deleted)
Patient ID: ASTRIA MADRON, female   DOB: 12-26-1970, 52 y.o.   MRN: [id number]   HPI: JAIRY GILHOOLEY is a 52 y.o.-year-old female, returning for f/u for DM2, dx with GDM in 1995, then DM 6 mo after son was born, insulin-dependent since ~2010, controlled, without long-term complications and PCOS. Last visit 4 months ago.  Interim history: She was previously seen with weight management clinic at [facility] - nonsurgical weight loss program.  She lost 20 pounds on their diet but could not be continue due to price.  Since last visit, insurance denied [place].  After last visit, we were able to switch from Trulicity to Ozempic.   No increased urination, blurry vision, nausea, chest pain.   Reviewed HbA1c levels: 06/07/2022: HbA1c 7.2% Lab Results  Component Value Date   HGBA1C 6.3 (A) 09/28/2021   HGBA1C 7.0 (A) 05/31/2021   HGBA1C 7.0 (A) 10/14/2020  05/31/2021: HbA1c 7.0%  She is on: - Metformin ER 750 mg 2x a day - Farxiga 10 mg in am, before b'fast - Levemir 25 >> 30 units at bedtime - Novolog 8-15 units depending on the size of the meal - Trulicity 4.5 mg weekly >> Wegovy 2.4 mg weekly (started 05/2021; declined by insurance 12/2021) >> Trulicity 4.5 mg weekly Of note, she could not tolerate Jardiance 10 (started 01/2018) because of increased urination.  Pt checks her sugars 2-3 times a day: - am:  106-179, 194 >> 119-187 >> 84-, 116-171 - 2h after b'fast:  151 >> n/c >> 124-167, 187 >> 182 - before lunch: n/c >> 124-151 >> 86-148, 191 >> 98-130 - 2h after lunch:  1 150-190 >> n/c>> 96-177, 200 >> n/c - before dinner:  140-160 >> 129 >> 67-177 >> 113-146 - 2h after dinner: n/c >> 89-189, 205 >> n/c >> 137-201 - bedtime: 127-204 >> 160-200 (snack) >> n/c >> 106-154 - nighttime: n/c Lowest sugar was  54 >> 84; she has hypoglycemia awareness in the 80s. Highest sugar was 501 (streroid) >> ...302 >> ... 201.  + CKD with proteinuria-sees nephrology (Dr. Signe Colt), last BUN/creatinine:   09/07/2021: 17/0.84, GFR 85, protein to creatinine ratio 83 (0-200), glucose 123 Lab Results  Component Value Date   BUN 19 11/29/2020   CREATININE 1.03 (H) 11/29/2020  She is off lisinopril.  + HL; last set of lipids: Lab Results  Component Value Date   CHOL 152 07/16/2022   HDL 49.20 07/16/2022   LDLCALC 81 07/16/2022   TRIG 109.0 07/16/2022   CHOLHDL 3 07/16/2022  06/16/2019: 143/98/44/82 She had leg cramps with Crestor 10. Started drinking more water >> helped.  - last eye exam was: 11/16/2021: No DR; Dr. Dione Booze.  - no numbness and tingling in her feet.  Last foot exam 09/28/2021.  Latest TSH: 06/16/2019: 3.279  PCOS: -She had irregular menses since menarche -+1 pregnancies, no miscarriages - was on Mirena IUD - for ~4.5 years >> then on Sprintec since 2016 >> then Orthocycen >> now Nexplanon since 2020 -She continues on metformin ER 750 mg twice a day and spironolactone 50 mg twice a day -this helps with her hirsutism  Latest potassium level is normal: Lab Results  Component Value Date   K 4.9 11/29/2020   ROS: + see HPI  I reviewed pt's medications, allergies, PMH, social hx, family hx, and changes were documented in the history of present illness. Otherwise, unchanged from my initial visit note.  Past Medical History:  Diagnosis Date   Anxiety  Arthritis    left shoulder   Back pain    Chronic kidney disease    stage 1   Depression    Diabetes mellitus without complication (HCC)    type 2- on meds   Diverticulitis    High cholesterol    History of chicken pox    Hyperlipidemia    on meds   Hypertension    on meds   Joint pain    Lactose intolerance    PCOS (polycystic ovarian syndrome)    Seasonal allergies    Shingles    Sleep apnea    does NOT use CPAP   SVD (spontaneous vaginal delivery) 1995   x 1   Type 2 diabetes mellitus, uncontrolled 07/30/2006   Qualifier: Diagnosis of  By: Lanier Prude  MD, Cathrine Muster     Past Surgical History:  Procedure  Laterality Date   ANKLE FRACTURE SURGERY  05/28/1989   right   CHOLECYSTECTOMY  05/28/2006   COLONOSCOPY  1989   due to blood in stool and diverticulitis per pt   HYSTEROSCOPY N/A 09/13/2014   Procedure: HYSTEROSCOPY with IUD removal;  Surgeon: Mitchel Honour, DO;  Location: [facility];  Service: Gynecology;  Laterality: N/A;  need longest speculum and instruments that aresavailable due to patient's habitus (BMI ~60)   Social History   Socioeconomic History   Marital status: Single    Spouse name: Not on file   Number of children: Not on file   Years of education: Not on file   Highest education level: Not on file  Occupational History   Not on file  Tobacco Use   Smoking status: Never   Smokeless tobacco: Never  Vaping Use   Vaping Use: Never used  Substance and Sexual Activity   Alcohol use: No    Alcohol/week: 0.0 standard drinks of alcohol   Drug use: No   Sexual activity: Yes    Partners: Male    Birth control/protection: Pill  Other Topics Concern   Not on file  Social History Narrative   Single- lives alone   Son 21- senior at [employer]   Enjoys reading   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 30 capsule 0   cetirizine (ZYRTEC) 10 MG tablet Take 10 mg by mouth daily.     Continuous Blood Gluc Sensor (FREESTYLE LIBRE 3 SENSOR) MISC 1 each by Does not apply route every 14 (fourteen) days. 6 each 3   cyclobenzaprine (FLEXERIL) 10 MG tablet Take  1 tablet (10 mg total) by mouth 3 (three) times daily as needed for muscle spasms. 30 tablet 1   dapagliflozin propanediol (FARXIGA) 10 MG TABS tablet TAKE 1 TABLET BY MOUTH DAILY BEFORE BREAKFAST AS DIRECTED 90 tablet 3   FLUoxetine (PROZAC) 20 MG capsule TAKE 1 CAPSULE BY MOUTH EVERY DAY 90 capsule 1   insulin aspart (NOVOLOG FLEXPEN) 100 UNIT/ML FlexPen INJECT 12-15 UNITS (DEPENDING ON MEAL SIZE) UNDER  THE SKIN 3 TIMES A DAY 30 mL 0   insulin glargine (LANTUS SOLOSTAR) 100 UNIT/ML Solostar Pen Inject 30 Units into the skin daily. 30 mL 3   Insulin Pen Needle 32G X 4 MM MISC Use to inject insulin 4 times daily 400 each 3   metFORMIN (GLUCOPHAGE-XR) 750 MG 24 hr tablet Take 1 tablet (750 mg total) by mouth 2 (two) times daily. 180 tablet 3   Multiple Vitamin (MULTIVITAMIN PO) Take 1 tablet by mouth daily at 6 (six) AM.     naproxen (NAPROSYN) 500 MG tablet Take 1 tablet (500 mg total) by mouth 2 (two) times daily with a meal. 60 tablet 1   rosuvastatin (CRESTOR) 10 MG tablet TAKE 1 TABLET BY MOUTH EVERY DAY 90 tablet 1   Semaglutide, 2 MG/DOSE, (OZEMPIC, 2 MG/DOSE,) 8 MG/3ML SOPN Inject 2 mg into the skin once a week. 9 mL 3   spironolactone (ALDACTONE) 50 MG tablet TAKE 1 TABLET BY MOUTH TWICE A DAY 180 tablet 3   No current facility-administered medications for this visit.    Allergies  Allergen Reactions   Lisinopril Swelling    Swelling of the left side of tongue   Lipitor [Atorvastatin] Other (See Comments)    Myalgia/foot cramping   Family History  Problem Relation Age of Onset   Obesity Mother    Colon polyps Mother    Cancer Mother        breast   Hyperlipidemia Mother    Heart disease Mother    Hypertension Mother    Mental illness Mother        depression   Diabetes Mother    Alcoholism Father    Hyperlipidemia Father    Stroke Father    Hypertension Father    Cancer Maternal Grandmother        colon   Diabetes Maternal Grandmother    Diabetes Maternal Aunt     Glaucoma Maternal Aunt    Colon polyps Maternal Uncle    Colon cancer Maternal Uncle    Diabetes Maternal Uncle    Diabetes Maternal Uncle    Esophageal cancer Neg Hx    Rectal cancer Neg Hx    Stomach cancer Neg Hx     PE: There were no vitals taken for this visit. Wt Readings from Last 3 Encounters:  07/16/22 255 lb (115.7 kg)  06/15/22 265 lb (120.2 kg)  04/13/22 266 lb (120.7 kg)   Constitutional: overweight, in NAD Eyes:  EOMI, no exophthalmos ENT: no neck masses, no cervical lymphadenopathy Cardiovascular: RRR, No MRG Respiratory: CTA B Musculoskeletal: no deformities Skin:no rashes Neurological: no tremor with outstretched hands  ASSESSMENT: 1. DM2, insulin-dependent, controlled, without long-term complications, but with hyperglycemia  2. PCOS Component     Latest Ref Rng 10/16/2013  Testosterone     10 - 70 ng/dL 54  Sex Hormone Binding     18 - 114 nmol/L 12 (L)  Testosterone Free     0.6 - 6.8 pg/mL 15.5 (H)  Testosterone-% Free     0.4 - 2.4 % 2.9 (H)  TSH     0.35 - 4.50 uIU/mL 0.54  17-OH-Progesterone, LC/MS/MS      <8  Free T4     0.60 - 1.60 ng/dL 1.61  T3, Free     2.3 - 4.2 pg/mL 3.0   Labs confirmed PCOS (high testosterone, low 17-HO Prog, normal TFTs).  LH and FSH were not tested as she was on the Mirena IUD.  3. HL  PLAN:  1. DM2 - Patient with longstanding, reasonably controlled type 2 diabetes, on a complex medication regimen including metformin, SGLT2 inhibitor, basal-bolus insulin and GLP-1 receptor agonist, with worsening control at last visit, when HbA1c returned 7.2%.  At that time, per review of her [place] meter records, sugars are mostly at goal but with hyperglycemic spikes possibly due to recent dietary indiscretions during her cruise.  I advised her to switch from Trulicity to [facility] for a stronger effect.  We continued the rest of the regimen.  -  I suggested to:  Patient Instructions  Please continue: - Metformin ER 1500  mg with dinner - Farxiga 10 mg in am, before b'fast   - Novolog 12-15 units depending on the size of the meal - Levemir 30 units at bedtime - Ozempic 1 mg weekly  Please return in 4 months with your sugar log or CGM.  - we checked her HbA1c: 7%  - advised to check sugars at different times of the day - 4x a day, rotating check times - advised for yearly eye exams >> she is UTD - return to clinic in 4 months  2. PCOS -Patient with clinically evident PCOS, confirmed biochemically in 2015, now perimenopausal -She was on oral contraceptives (Ortho-Cyclen), tolerated well >> then Nexplanon -She continues on metformin ER and spironolactone.  She had a slightly high potassium in 2019 while on a potassium supplement, which we stopped.  He is also on an SGLT2 inhibitor, which can raise potassium.  His potassium level was reviewed and this was normal: Lab Results  Component Value Date   K 4.9 11/29/2020  -She decided against weight loss surgery -We previously use Wegovy for her but this was denied by her insurance 01/18/2022 due to lack of effect.  She was previously on Trulicity and we restarted this after Reginal Lutes was denied.  This should also help with weight loss.  -At last visit, she was able to obtain Ozempic but did not start and I advised her to switch from Trulicity to Ozempic for stronger effect -She lost 10 pounds after the switch  3. HL -Reviewed latest lipid panel from 06/2022: Fractions at goal: Lab Results  Component Value Date   CHOL 152 07/16/2022   HDL 49.20 07/16/2022   LDLCALC 81 07/16/2022   TRIG 109.0 07/16/2022   CHOLHDL 3 07/16/2022  -She continues on Crestor 10 mg daily.  She had muscle cramps in the past but these resolved after she increased her water intake  Carlus Pavlov, MD PhD [facility] Endocrinology

## 2022-10-16 ENCOUNTER — Telehealth: Payer: 59 | Admitting: Physician Assistant

## 2022-10-16 ENCOUNTER — Telehealth: Payer: 59

## 2022-10-16 DIAGNOSIS — M25552 Pain in left hip: Secondary | ICD-10-CM

## 2022-10-16 NOTE — Progress Notes (Signed)
Virtual Visit Consent   Audrey Goodman, you are scheduled for a virtual visit with a [facility] provider today. Just as with appointments in the office, your consent must be obtained to participate. Your consent will be active for this visit and any virtual visit you may have with one of our providers in the next 365 days. If you have a MyChart account, a copy of this consent can be sent to you electronically.  As this is a virtual visit, video technology does not allow for your provider to perform a traditional examination. This may limit your provider's ability to fully assess your condition. If your provider identifies any concerns that need to be evaluated in person or the need to arrange testing (such as labs, EKG, etc.), we will make arrangements to do so. Although advances in technology are sophisticated, we cannot ensure that it will always work on either your end or our end. If the connection with a video visit is poor, the visit may have to be switched to a telephone visit. With either a video or telephone visit, we are not always able to ensure that we have a secure connection.  By engaging in this virtual visit, you consent to the provision of healthcare and authorize for your insurance to be billed (if applicable) for the services provided during this visit. Depending on your insurance coverage, you may receive a charge related to this service.  I need to obtain your verbal consent now. Are you willing to proceed with your visit today? Audrey Goodman has provided verbal consent on 10/16/2022 for a virtual visit (video or telephone). Margaretann Loveless, PA-C  Date: 10/16/2022 5:04 PM  Virtual Visit via Video Note   I, Margaretann Loveless, connected with  Audrey Goodman  ([id number], 09-22-1970) on 10/16/22 at  5:00 PM EDT by a video-enabled telemedicine application and verified that I am speaking with the correct person using two identifiers.  Location: Patient: Virtual Visit Location  Patient: Home Provider: Virtual Visit Location Provider: Home Office   I discussed the limitations of evaluation and management by telemedicine and the availability of in person appointments. The patient expressed understanding and agreed to proceed.    History of Present Illness: Audrey Goodman is a 52 y.o. who identifies as a female who was assigned female at birth, and is being seen today for left hip pain. Seen Virtually on 10/13/22 for same issue and was prescribed Flexeril and Naprosyn. Has been taking as prescribed without relief. Pain radiates from the top of the left buttocks around to the left groin. Then starting yesterday she was unable to stand upright and has had to walk around hunched over. Still having pain in the left groin that now is radiating into the anterior thigh area when she tries to stand upright.   Problems:  Patient Active Problem List   Diagnosis Date Noted   Attention deficit hyperactivity disorder, combined type, moderate 03/01/2022   Impacted cerumen of both ears 01/23/2021   Left Achilles tendinitis 06/19/2017   Depression 07/13/2015   Preventative health care 06/23/2014   PCOS (polycystic ovarian syndrome) 09/10/2013   CHOLELITHIASIS 02/20/2008   Hyperlipidemia 08/06/2006   Uncontrolled diabetes mellitus with hyperglycemia (HCC) 07/30/2006   HYPERTENSION, BENIGN ESSENTIAL 07/30/2006   MORBID OBESITY 07/25/2006    Allergies:  Allergies  Allergen Reactions   Lisinopril Swelling    Swelling of the left side of tongue   Lipitor [Atorvastatin] Other (See Comments)    Myalgia/foot  cramping   Medications:  Current Outpatient Medications:    amLODipine (NORVASC) 5 MG tablet, TAKE 1 TABLET BY MOUTH EVERY DAY, Disp: 90 tablet, Rfl: 0   amphetamine-dextroamphetamine (ADDERALL XR) 30 MG 24 hr capsule, Take 1 capsule (30 mg total) by mouth every morning., Disp: 30 capsule, Rfl: 0   cetirizine (ZYRTEC) 10 MG tablet, Take 10 mg by mouth daily., Disp: , Rfl:     Continuous Blood Gluc Sensor (FREESTYLE LIBRE 3 SENSOR) MISC, 1 each by Does not apply route every 14 (fourteen) days., Disp: 6 each, Rfl: 3   cyclobenzaprine (FLEXERIL) 10 MG tablet, Take 1 tablet (10 mg total) by mouth 3 (three) times daily as needed for muscle spasms., Disp: 30 tablet, Rfl: 1   dapagliflozin propanediol (FARXIGA) 10 MG TABS tablet, TAKE 1 TABLET BY MOUTH DAILY BEFORE BREAKFAST AS DIRECTED, Disp: 90 tablet, Rfl: 3   FLUoxetine (PROZAC) 20 MG capsule, TAKE 1 CAPSULE BY MOUTH EVERY DAY, Disp: 90 capsule, Rfl: 1   insulin aspart (NOVOLOG FLEXPEN) 100 UNIT/ML FlexPen, INJECT 12-15 UNITS (DEPENDING ON MEAL SIZE) UNDER THE SKIN 3 TIMES A DAY, Disp: 30 mL, Rfl: 0   insulin glargine (LANTUS SOLOSTAR) 100 UNIT/ML Solostar Pen, Inject 30 Units into the skin daily., Disp: 30 mL, Rfl: 3   Insulin Pen Needle 32G X 4 MM MISC, Use to inject insulin 4 times daily, Disp: 400 each, Rfl: 3   metFORMIN (GLUCOPHAGE-XR) 750 MG 24 hr tablet, Take 1 tablet (750 mg total) by mouth 2 (two) times daily., Disp: 180 tablet, Rfl: 3   Multiple Vitamin (MULTIVITAMIN PO), Take 1 tablet by mouth daily at 6 (six) AM., Disp: , Rfl:    naproxen (NAPROSYN) 500 MG tablet, Take 1 tablet (500 mg total) by mouth 2 (two) times daily with a meal., Disp: 60 tablet, Rfl: 1   rosuvastatin (CRESTOR) 10 MG tablet, TAKE 1 TABLET BY MOUTH EVERY DAY, Disp: 90 tablet, Rfl: 1   Semaglutide, 2 MG/DOSE, (OZEMPIC, 2 MG/DOSE,) 8 MG/3ML SOPN, Inject 2 mg into the skin once a week., Disp: 9 mL, Rfl: 3   spironolactone (ALDACTONE) 50 MG tablet, TAKE 1 TABLET BY MOUTH TWICE A DAY, Disp: 180 tablet, Rfl: 3  Observations/Objective: Patient is well-developed, well-nourished in no acute distress.  Resting comfortably at home.  Head is normocephalic, atraumatic.  No labored breathing.  Speech is clear and coherent with logical content.  Patient is alert and oriented at baseline.    Assessment and Plan: 1. Left hip pain  - Worsening  despite prescription medications - Advised to seek care at [facility]  Follow Up Instructions: I discussed the assessment and treatment plan with the patient. The patient was provided an opportunity to ask questions and all were answered. The patient agreed with the plan and demonstrated an understanding of the instructions.  A copy of instructions were sent to the patient via MyChart unless otherwise noted below.    The patient was advised to call back or seek an in-person evaluation if the symptoms worsen or if the condition fails to improve as anticipated.  Time:  I spent 8 minutes with the patient via telehealth technology discussing the above problems/concerns.    Margaretann Loveless, PA-C

## 2022-10-16 NOTE — Patient Instructions (Signed)
Chauncey Cruel, thank you for joining Margaretann Loveless, PA-C for today's virtual visit.  While this provider is not your primary care provider (PCP), if your PCP is located in our provider database this encounter information will be shared with them immediately following your visit.   A [facility] MyChart account gives you access to today's visit and all your visits, tests, and labs performed at [facility] " click here if you don't have a [facility] MyChart account or go to [email or web address]  Consent: (Patient) Audrey Goodman provided verbal consent for this virtual visit at the beginning of the encounter.  Current Medications:  Current Outpatient Medications:    amLODipine (NORVASC) 5 MG tablet, TAKE 1 TABLET BY MOUTH EVERY DAY, Disp: 90 tablet, Rfl: 0   amphetamine-dextroamphetamine (ADDERALL XR) 30 MG 24 hr capsule, Take 1 capsule (30 mg total) by mouth every morning., Disp: 30 capsule, Rfl: 0   cetirizine (ZYRTEC) 10 MG tablet, Take 10 mg by mouth daily., Disp: , Rfl:    Continuous Blood Gluc Sensor (FREESTYLE LIBRE 3 SENSOR) MISC, 1 each by Does not apply route every 14 (fourteen) days., Disp: 6 each, Rfl: 3   cyclobenzaprine (FLEXERIL) 10 MG tablet, Take 1 tablet (10 mg total) by mouth 3 (three) times daily as needed for muscle spasms., Disp: 30 tablet, Rfl: 1   dapagliflozin propanediol (FARXIGA) 10 MG TABS tablet, TAKE 1 TABLET BY MOUTH DAILY BEFORE BREAKFAST AS DIRECTED, Disp: 90 tablet, Rfl: 3   FLUoxetine (PROZAC) 20 MG capsule, TAKE 1 CAPSULE BY MOUTH EVERY DAY, Disp: 90 capsule, Rfl: 1   insulin aspart (NOVOLOG FLEXPEN) 100 UNIT/ML FlexPen, INJECT 12-15 UNITS (DEPENDING ON MEAL SIZE) UNDER THE SKIN 3 TIMES A DAY, Disp: 30 mL, Rfl: 0   insulin glargine (LANTUS SOLOSTAR) 100 UNIT/ML Solostar Pen, Inject 30 Units into the skin daily., Disp: 30 mL, Rfl: 3   Insulin Pen Needle 32G X 4 MM MISC, Use to inject insulin 4 times daily, Disp: 400 each, Rfl: 3    metFORMIN (GLUCOPHAGE-XR) 750 MG 24 hr tablet, Take 1 tablet (750 mg total) by mouth 2 (two) times daily., Disp: 180 tablet, Rfl: 3   Multiple Vitamin (MULTIVITAMIN PO), Take 1 tablet by mouth daily at 6 (six) AM., Disp: , Rfl:    naproxen (NAPROSYN) 500 MG tablet, Take 1 tablet (500 mg total) by mouth 2 (two) times daily with a meal., Disp: 60 tablet, Rfl: 1   rosuvastatin (CRESTOR) 10 MG tablet, TAKE 1 TABLET BY MOUTH EVERY DAY, Disp: 90 tablet, Rfl: 1   Semaglutide, 2 MG/DOSE, (OZEMPIC, 2 MG/DOSE,) 8 MG/3ML SOPN, Inject 2 mg into the skin once a week., Disp: 9 mL, Rfl: 3   spironolactone (ALDACTONE) 50 MG tablet, TAKE 1 TABLET BY MOUTH TWICE A DAY, Disp: 180 tablet, Rfl: 3   Medications ordered in this encounter:  No orders of the defined types were placed in this encounter.    *If you need refills on other medications prior to your next appointment, please contact your pharmacy*  Follow-Up: Call back or seek an in-person evaluation if the symptoms worsen or if the condition fails to improve as anticipated.  [facility] [phone]  Other Instructions  [facility] Spe[facility] [address] [place], [place] [address] URGENT CARE HOURS Monday - Friday: 8:00am to 8:00pm Saturday: 10:00am to 3:00pm [phone]    If you have been instructed to have an in-person evaluation today at a local Urgent Care facility, please use the  link below. It will take you to a list of all of our available [facility], including address, phone number and hours of operation. Please do not delay care.  [facility]  If you or a family member do not have a primary care provider, use the link below to schedule a visit and establish care. When you choose a [facility] primary care physician or advanced practice provider, you gain a long-term partner in health. Find a Primary Care Provider  Learn more about [facility]'s in-office and virtual care options: [facility] - Get Care Now

## 2022-10-18 ENCOUNTER — Ambulatory Visit: Payer: 59 | Admitting: Behavioral Health

## 2022-10-18 DIAGNOSIS — F418 Other specified anxiety disorders: Secondary | ICD-10-CM | POA: Diagnosis not present

## 2022-10-18 DIAGNOSIS — F902 Attention-deficit hyperactivity disorder, combined type: Secondary | ICD-10-CM

## 2022-10-18 NOTE — Progress Notes (Signed)
                Josie Burleigh L Clarice Bonaventure, LMFT 

## 2022-10-18 NOTE — Progress Notes (Signed)
[facility] Counselor/Therapist Progress Note  Patient ID: Audrey Goodman, MRN: [id number],    Date: 10/18/2022  Time Spent: 55 min Caregility video; Pt is home in private & Provider working remote from [employer]   Treatment Type: Individual Therapy  Reported Symptoms: Elevated anx/dep & stress due to Px pain of sciatica that have occupied her time.   Mental Status Exam: Appearance:  Casual     Behavior: Appropriate and Sharing  Motor: Normal  Speech/Language:  Clear and Coherent  Affect: Appropriate  Mood: normal  Thought process: normal  Thought content:   WNL  Sensory/Perceptual disturbances:   WNL  Orientation: oriented to person, place, and time/date  Attention: Good  Concentration: Good  Memory: WNL  Fund of knowledge:  Good  Insight:   Good  Judgment:  Fair  Impulse Control: Fair   Risk Assessment: Danger to Self:  No Self-injurious Behavior: No Danger to Others: No Duty to Warn:no Physical Aggression / Violence:No  Access to Firearms a concern: No  Gang Involvement:No   Subjective: Pt is struggling w/her gambling addiction. She is trying to navigate the situation in different ways than previously tried.    Interventions: Family Systems &addiction psychoedu  Diagnosis:Depression with anxiety  Attention deficit hyperactivity disorder (ADHD), combined type, moderate  Plan: [place] is c/o her financial struggles due to her gambling addiction. She has put some coping skills in place to assist her in management of her Px issues. She is worried for her finances & is determined to prevent the upset it may cause her. She will watch YouTube videos that apply to her & put a plan into place where she is unable to self-sabotage.  [place] Date: 11/25/2022  Progress: 4  Frequency: Once every 3-4 wks  Modality: Claretta Fraise, LMFT

## 2022-10-19 ENCOUNTER — Encounter: Payer: Self-pay | Admitting: Internal Medicine

## 2022-10-22 ENCOUNTER — Encounter: Payer: Self-pay | Admitting: Internal Medicine

## 2022-10-22 ENCOUNTER — Encounter: Payer: Self-pay | Admitting: Family

## 2022-10-27 ENCOUNTER — Encounter: Payer: Self-pay | Admitting: Internal Medicine

## 2022-10-28 ENCOUNTER — Other Ambulatory Visit: Payer: Self-pay | Admitting: Internal Medicine

## 2022-10-29 NOTE — Telephone Encounter (Signed)
Records printed for provider's review

## 2022-11-04 ENCOUNTER — Encounter: Payer: Self-pay | Admitting: Internal Medicine

## 2022-11-08 ENCOUNTER — Other Ambulatory Visit: Payer: Self-pay | Admitting: Family

## 2022-11-08 ENCOUNTER — Ambulatory Visit: Payer: 59 | Admitting: Behavioral Health

## 2022-11-08 DIAGNOSIS — F418 Other specified anxiety disorders: Secondary | ICD-10-CM

## 2022-11-08 DIAGNOSIS — F89 Unspecified disorder of psychological development: Secondary | ICD-10-CM

## 2022-11-08 DIAGNOSIS — F902 Attention-deficit hyperactivity disorder, combined type: Secondary | ICD-10-CM | POA: Diagnosis not present

## 2022-11-08 NOTE — Progress Notes (Signed)
                Audrey Randa L Holiday Mcmenamin, LMFT 

## 2022-11-08 NOTE — Progress Notes (Addendum)
[facility] Counselor/Therapist Progress Note  Patient ID: Audrey Goodman, MRN: [id number],    Date: 11/08/2022  Time Spent: 55 min Caregility video; Pt is home in private & Provider remote from [facility]. Pt is aware of telehealth limitations & consents to Tx.   Treatment Type: Individual Therapy  Reported Symptoms: Elevated anx/dep & stress due to Px pain from sciatica  Mental Status Exam: Appearance:  Casual     Behavior: Appropriate and Sharing  Motor: Normal  Speech/Language:  Clear and Coherent  Affect: Appropriate  Mood: anxious  Thought process: normal  Thought content:   WNL  Sensory/Perceptual disturbances:   WNL  Orientation: oriented to person, place, and time/date  Attention: Good  Concentration: Good  Memory: WNL  Fund of knowledge:  Fair  Insight:   Fair  Judgment:  Good  Impulse Control: Fair   Risk Assessment: Danger to Self:  No Self-injurious Behavior: No Danger to Others: No Duty to Warn:no Physical Aggression / Violence:No  Access to Firearms a concern: No  Gang Involvement:No   Subjective: Pt is upset today over her concerns for sciatica pain that is making walking hard. She is upset over her Tx by a Provider. She is moving her care to [facility].   Interventions: Solution-Oriented/Positive Psychology  Diagnosis:Depression with anxiety  Attention deficit hyperactivity disorder (ADHD), combined type, moderate  Unspecified disorder of psychological development  Plan: [name] is c/o the money her health status changes are taking to keep her well. She is frustrated w/her healthcare costs. She is trying to manage her bills so she can cover the costs of her healthcare needs. She will call her Ins & get details of coverage.  Target Date: 12/10/2022  Progress: 4  Frequency: Once every 3-4 wks  Modality: Claretta Fraise, LMFT

## 2022-11-11 ENCOUNTER — Other Ambulatory Visit: Payer: Self-pay | Admitting: Family

## 2022-11-11 DIAGNOSIS — F902 Attention-deficit hyperactivity disorder, combined type: Secondary | ICD-10-CM

## 2022-11-12 MED ORDER — AMPHETAMINE-DEXTROAMPHET ER 30 MG PO CP24
30.0000 mg | ORAL_CAPSULE | ORAL | 0 refills | Status: DC
Start: 2022-11-12 — End: 2022-12-08

## 2022-11-12 NOTE — Telephone Encounter (Signed)
Requesting: amphetamine-dextroamphetamine (ADDERALL XR) 30 MG 24 hr capsule Take 1 capsule (30 mg total) by mouth every morning.  Contract: 03/16/2022 UDS: 03/16/2022 Last Visit: 09/12/2022 Next Visit: 11/14/2022 Last Refill: 10/08/2022  Please Advise

## 2022-11-14 ENCOUNTER — Telehealth: Payer: 59 | Admitting: Family

## 2022-11-15 ENCOUNTER — Encounter: Payer: Self-pay | Admitting: Orthopedic Surgery

## 2022-11-19 ENCOUNTER — Telehealth (INDEPENDENT_AMBULATORY_CARE_PROVIDER_SITE_OTHER): Payer: 59 | Admitting: Family

## 2022-11-19 ENCOUNTER — Encounter: Payer: Self-pay | Admitting: Internal Medicine

## 2022-11-19 ENCOUNTER — Encounter: Payer: Self-pay | Admitting: Family

## 2022-11-19 VITALS — BP 126/77

## 2022-11-19 DIAGNOSIS — F902 Attention-deficit hyperactivity disorder, combined type: Secondary | ICD-10-CM

## 2022-11-19 DIAGNOSIS — F32A Depression, unspecified: Secondary | ICD-10-CM

## 2022-11-19 MED ORDER — AMPHETAMINE-DEXTROAMPHETAMINE 10 MG PO TABS: 5.0000 mg | ORAL_TABLET | Freq: Every day | ORAL | 0 refills | Status: DC | PRN

## 2022-11-19 NOTE — Assessment & Plan Note (Signed)
Stable mood with ongoing counseling. -Continue current counseling regimen. No changes at this time.

## 2022-11-19 NOTE — Progress Notes (Signed)
MyChart Video Visit    Virtual Visit via Video Note    Patient location: Home. Patient and provider in visit Provider location: Office  I discussed the limitations of evaluation and management by telemedicine and the availability of in person appointments. The patient expressed understanding and agreed to proceed.  Visit Date: 11/19/2022  Today's healthcare provider: Lemont Fillers, NP     Subjective:    Patient ID: Audrey Goodman, female    DOB: 12-Jul-1970, 52 y.o.   MRN: [id number]  Chief Complaint  Patient presents with   Diabetes    Follow up   ADHD    Follow up    HPI  The patient, with a history of ADHD and depression, presents for a follow-up visit. She was previously prescribed Adderall XR 30 mg for ADHD, which improved her morning focus but left her struggling in the afternoon. To address this, she has been halving 10 mg Adderall tablets from a previous prescription and taking an additional 5 mg as needed in the afternoon. This adjustment has proven beneficial for her. In regards to her depression, she is currently seeing a counselor and report a stable mood.  Past Medical History:  Diagnosis Date   Anxiety    Arthritis    left shoulder   Back pain    Chronic kidney disease    stage 1   Depression    Diabetes mellitus without complication (HCC)    type 2- on meds   Diverticulitis    High cholesterol    History of chicken pox    Hyperlipidemia    on meds   Hypertension    on meds   Joint pain    Lactose intolerance    PCOS (polycystic ovarian syndrome)    Seasonal allergies    Shingles    Sleep apnea    does NOT use CPAP   SVD (spontaneous vaginal delivery) 1995   x 1   Type 2 diabetes mellitus, uncontrolled 07/30/2006   Qualifier: Diagnosis of  By: Lanier Prude  MD, Cathrine Muster      Past Surgical History:  Procedure Laterality Date   ANKLE FRACTURE SURGERY  05/28/1989   right   CHOLECYSTECTOMY  05/28/2006   COLONOSCOPY  1989   due to  blood in stool and diverticulitis per pt   HYSTEROSCOPY N/A 09/13/2014   Procedure: HYSTEROSCOPY with IUD removal;  Surgeon: Mitchel Honour, DO;  Location: [facility];  Service: Gynecology;  Laterality: N/A;  need longest speculum and instruments that aresavailable due to patient's habitus (BMI ~60)    Family History  Problem Relation Age of Onset   Obesity Mother    Colon polyps Mother    Cancer Mother        breast   Hyperlipidemia Mother    Heart disease Mother    Hypertension Mother    Mental illness Mother        depression   Diabetes Mother    Alcoholism Father    Hyperlipidemia Father    Stroke Father    Hypertension Father    Cancer Maternal Grandmother        colon   Diabetes Maternal Grandmother    Diabetes Maternal Aunt    Glaucoma Maternal Aunt    Colon polyps Maternal Uncle    Colon cancer Maternal Uncle    Diabetes Maternal Uncle    Diabetes Maternal Uncle    Esophageal cancer Neg Hx    Rectal cancer Neg Hx  Stomach cancer Neg Hx     Social History   Socioeconomic History   Marital status: Single    Spouse name: Not on file   Number of children: Not on file   Years of education: Not on file   Highest education level: Not on file  Occupational History   Not on file  Tobacco Use   Smoking status: Never   Smokeless tobacco: Never  Vaping Use   Vaping Use: Never used  Substance and Sexual Activity   Alcohol use: No    Alcohol/week: 0.0 standard drinks of alcohol   Drug use: No   Sexual activity: Yes    Partners: Male    Birth control/protection: Pill  Other Topics Concern   Not on file  Social History Narrative   Single- lives alone   Son 21- senior at [employer]   Enjoys reading   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 30 capsule 0   cetirizine (ZYRTEC) 10 MG tablet Take 10 mg by mouth daily.     Continuous Blood Gluc Sensor (FREESTYLE LIBRE 3 SENSOR) MISC 1 each by Does not apply route every 14 (fourteen) days. 6 each 3   dapagliflozin propanediol (FARXIGA) 10 MG TABS tablet TAKE 1 TABLET BY MOUTH DAILY BEFORE BREAKFAST AS DIRECTED 90 tablet 0   FLUoxetine (PROZAC) 20 MG capsule Take 1 capsule (20 mg total) by mouth daily. 90 capsule 0   insulin aspart (NOVOLOG FLEXPEN) 100 UNIT/ML FlexPen INJECT 12-15 UNITS (DEPENDING ON MEAL SIZE) UNDER THE SKIN 3 TIMES A DAY 30 mL 0   insulin glargine (LANTUS SOLOSTAR) 100 UNIT/ML Solostar Pen Inject 30 Units into the skin daily. 30 mL 3   Insulin Pen Needle 32G X 4 MM MISC Use to inject insulin 4 times daily 400 each 3   metFORMIN (GLUCOPHAGE-XR) 750 MG 24 hr tablet Take 1 tablet (750 mg total) by mouth 2 (two) times daily. 180 tablet 3   Multiple Vitamin (MULTIVITAMIN PO) Take 1 tablet by mouth daily at 6 (six) AM.     rosuvastatin (CRESTOR) 10 MG tablet TAKE 1 TABLET BY MOUTH EVERY DAY 90 tablet 1   Semaglutide, 2 MG/DOSE, (OZEMPIC, 2 MG/DOSE,) 8 MG/3ML SOPN Inject 2 mg into the skin once a week. 9 mL 3   spironolactone (ALDACTONE) 50 MG tablet TAKE 1 TABLET BY MOUTH TWICE A DAY 180 tablet 3   cyclobenzaprine (FLEXERIL) 10 MG tablet Take 1 tablet (10 mg total) by mouth 3 (three) times daily as needed for muscle spasms. 30 tablet 1   naproxen (NAPROSYN) 500 MG tablet Take 1 tablet (500 mg total) by mouth 2 (two) times daily with a meal. 60 tablet 1   No facility-administered medications prior to visit.    Allergies  Allergen Reactions   Lisinopril Swelling     Swelling of the left side of tongue   Lipitor [Atorvastatin] Other (See Comments)  Myalgia/foot cramping    ROS     Objective:    Physical Exam  There were no vitals taken for this visit. Wt Readings from Last 3 Encounters:  07/16/22 255 lb (115.7 kg)  06/15/22 265 lb (120.2 kg)  04/13/22 266 lb (120.7 kg)   Gen: Awake, alert, no acute distress Resp: Breathing is even and non-labored Psych: calm/pleasant demeanor Neuro: Alert and Oriented x 3, + facial symmetry, speech is clear.     Assessment & Plan:   Problem List Items Addressed This Visit       Unprioritized   Depression    Stable mood with ongoing counseling. -Continue current counseling regimen. No changes at this time.      Attention deficit hyperactivity disorder, combined type, moderate - Primary    The patient, with a history of ADHD and depression, presents for a follow-up visit. She was previously prescribed Adderall XR 30 mg for ADHD, which improved her morning focus but left her struggling in the afternoon. To address this, she has been halving 10 mg Adderall tablets from a previous prescription and taking an additional 5 mg as needed in the afternoon. This adjustment has proven beneficial for her. In regards to her depression, she is currently seeing a counselor and report a stable mood.       I have discontinued Jilda C. Davis's naproxen and cyclobenzaprine. I am also having her start on amphetamine-dextroamphetamine. Additionally, I am having her maintain her Multiple Vitamin (MULTIVITAMIN PO), cetirizine, metFORMIN, FreeStyle Libre 3 Sensor, spironolactone, Ozempic (2 MG/DOSE), Insulin Pen Needle, Lantus SoloStar, rosuvastatin, amLODipine, NovoLOG FlexPen, Farxiga, FLUoxetine, and amphetamine-dextroamphetamine.  Meds ordered this encounter  Medications   amphetamine-dextroamphetamine (ADDERALL) 10 MG tablet    Sig: Take 0.5 tablets (5 mg total) by mouth daily as needed.    Dispense:  60 tablet     Refill:  0    Order Specific Question:   Supervising Provider    Answer:   Danise Edge A [[id number]]    I discussed the assessment and treatment plan with the patient. The patient was provided an opportunity to ask questions and all were answered. The patient agreed with the plan and demonstrated an understanding of the instructions.   The patient was advised to call back or seek an in-person evaluation if the symptoms worsen or if the condition fails to improve as anticipated.  Lemont Fillers, NP [facility] at [facility] [phone] (phone) [phone] (fax)  [facility]

## 2022-11-19 NOTE — Assessment & Plan Note (Signed)
The patient, with a history of ADHD and depression, presents for a follow-up visit. She was previously prescribed Adderall XR 30 mg for ADHD, which improved her morning focus but left her struggling in the afternoon. To address this, she has been halving 10 mg Adderall tablets from a previous prescription and taking an additional 5 mg as needed in the afternoon. This adjustment has proven beneficial for her. In regards to her depression, she is currently seeing a counselor and report a stable mood.

## 2022-11-19 NOTE — Patient Instructions (Signed)
  During your recent visit, we discussed your ongoing management of ADHD and depression. You've been taking Adderall XR 30mg  in the morning for your ADHD, which has been helping with your focus. To help with focus in the afternoon, you've been taking an additional 5mg  of Adderall as needed, which has also been beneficial. Regarding your depression, you've been seeing a counselor and report a stable mood.  YOUR PLAN:  -ADHD: ADHD, or Attention Deficit Hyperactivity Disorder, is a condition that affects your ability to focus and control impulsive behaviors. We will continue with your current regimen of Adderall XR 30mg  in the morning and an additional 5mg  of Adderall as needed in the afternoon. This plan has been helping improve your focus throughout the day.  -DEPRESSION: Depression is a mood disorder that causes a persistent feeling of sadness and loss of interest. You've been managing your depression with ongoing counseling, which has been helping maintain a stable mood. We will continue with this plan, as there are no changes needed at this time.  INSTRUCTIONS:  Please continue taking your medications as prescribed. Remember to take your Adderall XR 30mg  in the morning and an additional 5mg  of Adderall as needed in the afternoon. Also, continue with your counseling sessions for your depression. If you notice any changes in your mood or focus, or if you have any concerns, please don't hesitate to contact us.

## 2022-11-20 ENCOUNTER — Other Ambulatory Visit (INDEPENDENT_AMBULATORY_CARE_PROVIDER_SITE_OTHER): Payer: 59

## 2022-11-20 ENCOUNTER — Encounter: Payer: Self-pay | Admitting: Orthopedic Surgery

## 2022-11-20 ENCOUNTER — Ambulatory Visit (INDEPENDENT_AMBULATORY_CARE_PROVIDER_SITE_OTHER): Payer: 59 | Admitting: Orthopedic Surgery

## 2022-11-20 ENCOUNTER — Telehealth: Payer: Self-pay

## 2022-11-20 DIAGNOSIS — M25552 Pain in left hip: Secondary | ICD-10-CM | POA: Diagnosis not present

## 2022-11-20 NOTE — Telephone Encounter (Signed)
Ok for me to send this message to the PA team for the dx of weight loss?

## 2022-11-20 NOTE — Progress Notes (Addendum)
Office Visit Note   Patient: Audrey Goodman           Date of Birth: 1971/02/02           MRN: [id number] Visit Date: 11/20/2022              Requested by: Nadara Mustard, MD [address] [place],  [place] [address] PCP: [name], NP  Chief Complaint  Patient presents with   Lower Back - Pain      HPI: Patient is a   52 year old woman who is seen for initial evaluation for lower back pain left buttocks pain and left groin pain.  Patient has been followed at [facility] with Dr. Shon Baton.  She has had an x-ray and MRI scan.patient states she was post to be set up with physical therapy and pain management but this was taking too long.  Patient is on 300 mg 3 tablets at night.  She is currently on diclofenac but states she has kidney disease.  Patient states she did prednisone for a week without resolution.  Initial pain started on April 18 and on May 18 she could not stand secondary to pain.  Assessment & Plan: Visit Diagnoses:  1. Pain in left hip     Plan: Will have patient see Dr. Madelyn Brunner upstairs to proceed with a steroid injection of her left hip and see how she does with this.  Follow-Up Instructions: Return if symptoms worsen or fail to improve.   Ortho Exam  Patient is alert, oriented, no adenopathy, well-dressed, normal affect, normal respiratory effort. Examination patient has an abductor lurch on the left and uses a cane in her right hand for ambulation.  Patient has pain with extreme ranges of motion of the left hip.  She has a negative straight leg raise on the left no focal motor weakness with hip flexion hip abduction ankle plantarflexion and dorsiflexion.  Review of the MRI scan shows no focal disc pathology on the left side.  Imaging: XR HIP UNILAT W OR W/O PELVIS 2-3 VIEWS LEFT  Result Date: 11/20/2022 2 view radiographs of the left hip shows arthritic changes with subchondral cyst sclerosis and joint space narrowing.  No images are  attached to the encounter.  Labs: Lab Results  Component Value Date   HGBA1C 6.3 (A) 09/28/2021   HGBA1C 7.0 (A) 05/31/2021   HGBA1C 7.0 (A) 10/14/2020   LABORGA ESCHERICHIA COLI 07/14/2015     Lab Results  Component Value Date   ALBUMIN 4.4 11/29/2020   ALBUMIN 3.6 11/06/2018   ALBUMIN 3.7 07/13/2015    No results found for: "MG" Lab Results  Component Value Date   VD25OH 54.4 11/29/2020   VD25OH 22.54 (L) 06/23/2014    No results found for: "PREALBUMIN"    Latest Ref Rng & Units 11/29/2020    9:55 AM 03/08/2017    3:45 PM 07/13/2015    8:14 AM  CBC EXTENDED  WBC 3.4 - 10.8 x10E3/uL 7.6  11.0  8.4   RBC 3.77 - 5.28 x10E6/uL 5.18  4.27  4.39   Hemoglobin 11.1 - 15.9 g/dL 28.3  15.1  76.1   HCT 34.0 - 46.6 % 46.4  36.2  37.2   Platelets 150 - 450 x10E3/uL 338  384  288.0   NEUT# 1.4 - 7.0 x10E3/uL 4.0  6,864  4.4   Lymph# 0.7 - 3.1 x10E3/uL 2.7  3,047  3.2      There is no height or weight  on file to calculate BMI.  Orders:  Orders Placed This Encounter  Procedures   XR HIP UNILAT W OR W/O PELVIS 2-3 VIEWS LEFT   No orders of the defined types were placed in this encounter.    Procedures: No procedures performed  Clinical Data: No additional findings.  ROS:  All other systems negative, except as noted in the HPI. Review of Systems  Objective: Vital Signs: There were no vitals taken for this visit.  Specialty Comments:  No specialty comments available.  PMFS History: Patient Active Problem List   Diagnosis Date Noted   Attention deficit hyperactivity disorder, combined type, moderate 03/01/2022   Impacted cerumen of both ears 01/23/2021   Left Achilles tendinitis 06/19/2017   Depression 07/13/2015   Preventative health care 06/23/2014   PCOS (polycystic ovarian syndrome) 09/10/2013   CHOLELITHIASIS 02/20/2008   Hyperlipidemia 08/06/2006   Uncontrolled diabetes mellitus with hyperglycemia (HCC) 07/30/2006   HYPERTENSION, BENIGN ESSENTIAL  07/30/2006   MORBID OBESITY 07/25/2006   Past Medical History:  Diagnosis Date   Anxiety    Arthritis    left shoulder   Back pain    Chronic kidney disease    stage 1   Depression    Diabetes mellitus without complication (HCC)    type 2- on meds   Diverticulitis    High cholesterol    History of chicken pox    Hyperlipidemia    on meds   Hypertension    on meds   Joint pain    Lactose intolerance    PCOS (polycystic ovarian syndrome)    Seasonal allergies    Shingles    Sleep apnea    does NOT use CPAP   SVD (spontaneous vaginal delivery) 1995   x 1   Type 2 diabetes mellitus, uncontrolled 07/30/2006   Qualifier: Diagnosis of  By: Lanier Prude  MD, Cathrine Muster      Family History  Problem Relation Age of Onset   Obesity Mother    Colon polyps Mother    Cancer Mother        breast   Hyperlipidemia Mother    Heart disease Mother    Hypertension Mother    Mental illness Mother        depression   Diabetes Mother    Alcoholism Father    Hyperlipidemia Father    Stroke Father    Hypertension Father    Cancer Maternal Grandmother        colon   Diabetes Maternal Grandmother    Diabetes Maternal Aunt    Glaucoma Maternal Aunt    Colon polyps Maternal Uncle    Colon cancer Maternal Uncle    Diabetes Maternal Uncle    Diabetes Maternal Uncle    Esophageal cancer Neg Hx    Rectal cancer Neg Hx    Stomach cancer Neg Hx     Past Surgical History:  Procedure Laterality Date   ANKLE FRACTURE SURGERY  05/28/1989   right   CHOLECYSTECTOMY  05/28/2006   COLONOSCOPY  1989   due to blood in stool and diverticulitis per pt   HYSTEROSCOPY N/A 09/13/2014   Procedure: HYSTEROSCOPY with IUD removal;  Surgeon: Mitchel Honour, DO;  Location: [facility];  Service: Gynecology;  Laterality: N/A;  need longest speculum and instruments that aresavailable due to patient's habitus (BMI ~60)   Social History   Occupational History   Not on file  Tobacco Use   Smoking status: Never    Smokeless tobacco: Never  Vaping Use   Vaping Use: Never used  Substance and Sexual Activity   Alcohol use: No    Alcohol/week: 0.0 standard drinks of alcohol   Drug use: No   Sexual activity: Yes    Partners: Male    Birth control/protection: Pill

## 2022-11-20 NOTE — Telephone Encounter (Signed)
Please start PA on [facility]  Dx: weight loss

## 2022-11-21 ENCOUNTER — Encounter: Payer: Self-pay | Admitting: Orthopedic Surgery

## 2022-11-21 ENCOUNTER — Encounter: Payer: Self-pay | Admitting: Internal Medicine

## 2022-11-21 ENCOUNTER — Encounter: Payer: Self-pay | Admitting: Sports Medicine

## 2022-11-21 ENCOUNTER — Other Ambulatory Visit (HOSPITAL_COMMUNITY): Payer: Self-pay

## 2022-11-21 ENCOUNTER — Telehealth: Payer: Self-pay

## 2022-11-21 NOTE — Telephone Encounter (Signed)
Patient Advocate Encounter   Received notification from pt msgs that prior authorization is required for Wegovy  Submitted: 11/21/22 Key [id number]  Status is pending

## 2022-11-22 ENCOUNTER — Encounter: Payer: Self-pay | Admitting: Orthopedic Surgery

## 2022-11-22 NOTE — Telephone Encounter (Signed)
ERROR

## 2022-11-23 NOTE — Telephone Encounter (Signed)
Pharmacy Patient Advocate Encounter  Prior Authorization for Audrey Goodman has been APPROVED  Effective through 06/23/2023

## 2022-11-24 ENCOUNTER — Other Ambulatory Visit: Payer: Self-pay | Admitting: Internal Medicine

## 2022-11-26 NOTE — Telephone Encounter (Signed)
Please advise on which dosage of Wegovy can be sent in for this patient? PA has already been approved,

## 2022-11-26 NOTE — Telephone Encounter (Signed)
We can call in the 2.4 mg.  From what I remember she tolerated the 2 mg of Ozempic well.  If she has been out for a longer time, we may need to use the 1.7 mg first.  May want to check with the patient.

## 2022-11-27 ENCOUNTER — Other Ambulatory Visit: Payer: Self-pay | Admitting: Internal Medicine

## 2022-11-27 MED ORDER — WEGOVY 2.4 MG/0.75ML ~~LOC~~ SOAJ: 2.4000 mg | SUBCUTANEOUS | 2 refills | Status: DC

## 2022-11-27 NOTE — Addendum Note (Signed)
Addended by: Pollie Meyer on: 11/27/2022 05:04 PM   Modules accepted: Orders

## 2022-11-27 NOTE — Telephone Encounter (Signed)
2.4 sent pt tolerated meds well and has not been off meds for a while

## 2022-11-30 ENCOUNTER — Ambulatory Visit: Payer: 59 | Admitting: Behavioral Health

## 2022-12-01 ENCOUNTER — Other Ambulatory Visit: Payer: Self-pay | Admitting: Internal Medicine

## 2022-12-03 ENCOUNTER — Other Ambulatory Visit: Payer: Self-pay

## 2022-12-06 ENCOUNTER — Ambulatory Visit: Payer: 59 | Admitting: Sports Medicine

## 2022-12-07 ENCOUNTER — Other Ambulatory Visit: Payer: Self-pay

## 2022-12-07 ENCOUNTER — Ambulatory Visit: Payer: 59 | Admitting: Sports Medicine

## 2022-12-07 DIAGNOSIS — M25552 Pain in left hip: Secondary | ICD-10-CM

## 2022-12-07 DIAGNOSIS — M1612 Unilateral primary osteoarthritis, left hip: Secondary | ICD-10-CM | POA: Diagnosis not present

## 2022-12-07 MED ORDER — LIDOCAINE HCL 1 % IJ SOLN
4.0000 mL | INTRAMUSCULAR | Status: AC | PRN
Start: 2022-12-07 — End: 2022-12-07
  Administered 2022-12-07: 4 mL

## 2022-12-07 MED ORDER — METHYLPREDNISOLONE ACETATE 40 MG/ML IJ SUSP
40.0000 mg | INTRAMUSCULAR | Status: AC | PRN
Start: 2022-12-07 — End: 2022-12-07
  Administered 2022-12-07: 40 mg via INTRA_ARTICULAR

## 2022-12-07 NOTE — Patient Instructions (Signed)
Dr. Shon Baton' Guide for Management of Osteoarthritis:  1.)  Keep your body moving - keep working on flexibility and range of motion for the knee and hip.  Good physical activity exercise include: stationary bike, swimming, elliptical.  2.)  Maintain a healthy weight --> 1 pound of body weight = 4 pounds of weight on the knees.  Even small weight loss (5-10+ lbs) can significantly improve pain  3.)  Supplements helpful for arthritis and inflammation: Turmeric (curcumin) Boswellia serrata, collagen hydrolysate, Glucosamine-chondroitin

## 2022-12-07 NOTE — Progress Notes (Signed)
Audrey Goodman - 52 y.o. female MRN [id number]  Date of birth: 09-15-1970  Office Visit Note: Visit Date: 12/07/2022 PCP: [name], NP Referred by: [name], NP  Subjective: Chief Complaint  Patient presents with   Left Hip - Pain   HPI: Audrey Goodman is a pleasant 52 y.o. female who presents today for left hip pain.  Previously saw my partner, Dr. Lajoyce Corners, who thought pain was coming from the hip. Sent for further evaluation and possible diagnostic and therapeutic injection.  Audrey Goodman tells me that she has been using XS-Tylenol, Oral Diclofenac, and Gabapentin for pain control. She has pain with certain motions about the hip, worse when having the hip extend backward. Does go into the groin. Know she is to be working on weight loss, but has other questions about what would be helpful in terms of her treatment.   She has been followed at [facility] and has had x-ray and MRI scan of the low back as well.  Pertinent ROS were reviewed with the patient and found to be negative unless otherwise specified above in HPI.   Assessment & Plan: Visit Diagnoses:  1. Pain in left hip   2. Unilateral primary osteoarthritis, left hip    Plan: Discussed with Audrey Goodman that she certainly has a component of hip pain, through shared decision making elected to proceed with ultrasound-guided intra-articular hip injections for both hopefully diagnostic and therapeutic purposes.  Discussed continuing on healthy weight loss, we did discuss other treatment options as formalized physical therapy, safe exercises for her including stationary bike, swimming, elliptical.  I did provide her a handout for osteoarthritic management.  She is interested in starting formalized physical therapy, referral was sent today.  We will see how she does in the next 1-2 weeks from the injection and she will notify/follow-up with Dr. Lajoyce Corners depending on her improvement. I am happy to see her back as needed.  She may  continue over-the-counter Tylenol, ice for any postinjection pain.  Take caution on limiting oral diclofenac given her kidney disease.  If for some reason she does not receive good relief from the intra-articular hip injection, may be more pertinent to evaluate the lower back and possible radicular symptoms, we will leave this up to Dr. Lajoyce Corners.  Follow-up: Return for will f/u with Dr. Lajoyce Corners in 2-3 weeks depending on relief from injection.   Meds & Orders: No orders of the defined types were placed in this encounter.   Orders Placed This Encounter  Procedures   Large Joint Inj: L hip joint   US Guided Needle Placement - No Linked Charges   Ambulatory referral to Physical Therapy     Procedures: Large Joint Inj: L hip joint on 12/07/2022 2:09 PM Indications: pain Details: 22 G 3.5 in needle, ultrasound-guided anterior approach Medications: 4 mL lidocaine 1 %; 40 mg methylPREDNISolone acetate 40 MG/ML Outcome: tolerated well, no immediate complications  Procedure: US-guided intra-articular hip injection, left After discussion on risks/benefits/indications and informed verbal consent was obtained, a timeout was performed. Patient was lying supine on exam table. The hip was cleaned with betadine and alcohol swabs. Then utilizing ultrasound guidance, the patient's femoral head and neck junction was identified and subsequently injected with 4:1 lidocaine:depomedrol via an in-plane approach with ultrasound visualization of the injectate administered into the hip joint. Patient tolerated procedure well without immediate complications.  Procedure, treatment alternatives, risks and benefits explained, specific risks discussed. Consent was given by the patient. Immediately prior to procedure  a time out was called to verify the correct patient, procedure, equipment, support staff and site/side marked as required. Patient was prepped and draped in the usual sterile fashion.          Clinical  History: No specialty comments available.  She reports that she has never smoked. She has never used smokeless tobacco. No results for input(s): "HGBA1C", "LABURIC" in the last 8760 hours.  Objective:   Vital Signs: There were no vitals taken for this visit.  Physical Exam  Gen: Well-appearing, in no acute distress; non-toxic CV: Regular Rate. Well-perfused. Warm.  Resp: Breathing unlabored on room air; no wheezing. Psych: Fluid speech in conversation; appropriate affect; normal thought process Neuro: Sensation intact throughout. No gross coordination deficits.   Ortho Exam - Left hip: No bony TTP.   Imaging: XR HIP UNILAT W OR W/O PELVIS 2-3 VIEWS LEFT 2 view radiographs of the left hip shows arthritic changes with  subchondral cyst sclerosis and joint space narrowing.   Past Medical/Family/Surgical/Social History: Medications & Allergies reviewed per EMR, new medications updated. Patient Active Problem List   Diagnosis Date Noted   Attention deficit hyperactivity disorder, combined type, moderate 03/01/2022   Impacted cerumen of both ears 01/23/2021   Left Achilles tendinitis 06/19/2017   Depression 07/13/2015   Preventative health care 06/23/2014   PCOS (polycystic ovarian syndrome) 09/10/2013   CHOLELITHIASIS 02/20/2008   Hyperlipidemia 08/06/2006   Uncontrolled diabetes mellitus with hyperglycemia (HCC) 07/30/2006   HYPERTENSION, BENIGN ESSENTIAL 07/30/2006   MORBID OBESITY 07/25/2006   Past Medical History:  Diagnosis Date   Anxiety    Arthritis    left shoulder   Back pain    Chronic kidney disease    stage 1   Depression    Diabetes mellitus without complication (HCC)    type 2- on meds   Diverticulitis    High cholesterol    History of chicken pox    Hyperlipidemia    on meds   Hypertension    on meds   Joint pain    Lactose intolerance    PCOS (polycystic ovarian syndrome)    Seasonal allergies    Shingles    Sleep apnea    does NOT use CPAP    SVD (spontaneous vaginal delivery) 1995   x 1   Type 2 diabetes mellitus, uncontrolled 07/30/2006   Qualifier: Diagnosis of  By: Lanier Prude  MD, Cathrine Muster     Family History  Problem Relation Age of Onset   Obesity Mother    Colon polyps Mother    Cancer Mother        breast   Hyperlipidemia Mother    Heart disease Mother    Hypertension Mother    Mental illness Mother        depression   Diabetes Mother    Alcoholism Father    Hyperlipidemia Father    Stroke Father    Hypertension Father    Cancer Maternal Grandmother        colon   Diabetes Maternal Grandmother    Diabetes Maternal Aunt    Glaucoma Maternal Aunt    Colon polyps Maternal Uncle    Colon cancer Maternal Uncle    Diabetes Maternal Uncle    Diabetes Maternal Uncle    Esophageal cancer Neg Hx    Rectal cancer Neg Hx    Stomach cancer Neg Hx    Past Surgical History:  Procedure Laterality Date   ANKLE FRACTURE SURGERY  05/28/1989   right  CHOLECYSTECTOMY  05/28/2006   COLONOSCOPY  1989   due to blood in stool and diverticulitis per pt   HYSTEROSCOPY N/A 09/13/2014   Procedure: HYSTEROSCOPY with IUD removal;  Surgeon: Mitchel Honour, DO;  Location: [facility];  Service: Gynecology;  Laterality: N/A;  need longest speculum and instruments that aresavailable due to patient's habitus (BMI ~60)   Social History   Occupational History   Not on file  Tobacco Use   Smoking status: Never   Smokeless tobacco: Never  Vaping Use   Vaping status: Never Used  Substance and Sexual Activity   Alcohol use: No    Alcohol/week: 0.0 standard drinks of alcohol   Drug use: No   Sexual activity: Yes    Partners: Male    Birth control/protection: Pill

## 2022-12-08 ENCOUNTER — Other Ambulatory Visit: Payer: Self-pay | Admitting: Family

## 2022-12-08 DIAGNOSIS — F902 Attention-deficit hyperactivity disorder, combined type: Secondary | ICD-10-CM

## 2022-12-10 MED ORDER — AMPHETAMINE-DEXTROAMPHET ER 30 MG PO CP24
30.0000 mg | ORAL_CAPSULE | ORAL | 0 refills | Status: DC
Start: 2022-12-10 — End: 2023-01-22

## 2022-12-10 NOTE — Telephone Encounter (Signed)
Requesting: Adderall  Contract: 03/16/2022 UDS: 03/16/2022 Last Visit: VV 11/19/2022 Next Visit: 02/20/2023 Last Refill: 06/174/2024  Please Advise

## 2022-12-11 ENCOUNTER — Ambulatory Visit: Payer: 59 | Admitting: Sports Medicine

## 2022-12-14 ENCOUNTER — Ambulatory Visit: Payer: 59 | Admitting: Physical Therapy

## 2022-12-18 ENCOUNTER — Encounter: Payer: Self-pay | Admitting: Orthopedic Surgery

## 2022-12-21 ENCOUNTER — Ambulatory Visit: Payer: 59 | Admitting: Physical Therapy

## 2022-12-21 ENCOUNTER — Telehealth: Payer: Self-pay | Admitting: Orthopedic Surgery

## 2022-12-21 LAB — HM MAMMOGRAPHY

## 2022-12-21 NOTE — Telephone Encounter (Signed)
Patient has submitted forms for intermittent leave. Please advise if okay and the duration if approved. Thank you

## 2022-12-21 NOTE — Telephone Encounter (Signed)
Dr. Lajoyce Corners saw this pt one time in the office and referred to Dr. Shon Baton for injection which she had done on 78/04/2023. It would be ok to  do intermittent leave to cover the days and times of her physical therapy appointments.

## 2022-12-21 NOTE — Telephone Encounter (Signed)
Noted for [employer]. 

## 2022-12-24 ENCOUNTER — Ambulatory Visit: Payer: 59 | Admitting: Physical Therapy

## 2022-12-26 ENCOUNTER — Encounter: Payer: Self-pay | Admitting: Orthopedic Surgery

## 2022-12-28 ENCOUNTER — Ambulatory Visit (INDEPENDENT_AMBULATORY_CARE_PROVIDER_SITE_OTHER): Payer: 59 | Admitting: Behavioral Health

## 2022-12-28 ENCOUNTER — Encounter (INDEPENDENT_AMBULATORY_CARE_PROVIDER_SITE_OTHER): Payer: 59 | Admitting: Family

## 2022-12-28 DIAGNOSIS — F902 Attention-deficit hyperactivity disorder, combined type: Secondary | ICD-10-CM

## 2022-12-28 DIAGNOSIS — F89 Unspecified disorder of psychological development: Secondary | ICD-10-CM | POA: Diagnosis not present

## 2022-12-28 DIAGNOSIS — F32A Depression, unspecified: Secondary | ICD-10-CM

## 2022-12-28 DIAGNOSIS — F418 Other specified anxiety disorders: Secondary | ICD-10-CM | POA: Diagnosis not present

## 2022-12-28 MED ORDER — FLUOXETINE HCL 10 MG PO CAPS: 10.0000 mg | ORAL_CAPSULE | Freq: Every day | ORAL | 3 refills | Status: DC

## 2022-12-28 NOTE — Progress Notes (Signed)
                 L , LMFT 

## 2022-12-28 NOTE — Telephone Encounter (Signed)
Please see the MyChart message reply(ies) for my assessment and plan.  The patient gave consent for this Medical Advice Message and is aware that it may result in a bill to their insurance company as well as the possibility that this may result in a co-payment or deductible. They are an established patient, but are not seeking medical advice exclusively about a problem treated during an in person or video visit in the last 7 days. I did not recommend an in person or video visit within 7 days of my reply.  I spent a total of 5 minutes cumulative provider time within 7 days through [employer].  Nance Pear, NP

## 2022-12-28 NOTE — Progress Notes (Signed)
[facility] Counselor/Therapist Progress Note  Patient ID: Audrey Goodman, MRN: [id number],    Date: 12/28/2022  Time Spent: 55 min Caregility video; Pt is home in private & Provider working remotely from [employer]. Pt is aware of risks/limitations of telehealth & consents to Tx today.  Time In: 11:00am Time Out: 11:55am  Treatment Type: Individual Therapy  Reported Symptoms: Elevated anx/dep & stress  Mental Status Exam: Appearance:  Casual     Behavior: Appropriate and Sharing  Motor: Normal  Speech/Language:  Negative  Affect: Appropriate  Mood: normal  Thought process: normal  Thought content:   WNL  Sensory/Perceptual disturbances:   WNL  Orientation: oriented to person, place, time/date, and situation  Attention: Good  Concentration: Good  Memory: WNL  Fund of knowledge:  Good  Insight:   Good  Judgment:  Good  Impulse Control: Good   Risk Assessment: Danger to Self:  No Self-injurious Behavior: No Danger to Others: No Duty to Warn:no Physical Aggression / Violence:No  Access to Firearms a concern: No  Gang Involvement:No   Subjective: Pt is worrying excessively about her financial status & living arrangements.    Interventions: Psycho-education/Bibliotherapy and SFBT  Diagnosis:Depression with anxiety  Attention deficit hyperactivity disorder (ADHD), combined type, moderate  Unspecified disorder of psychological development  Plan: Audrey Goodman will contact her PCP [name], NP to suggest her Prozac 20mg  be adjusted since our talk today. She will do this today. Her financial situation has changed & she is intensely worried & needs medication to assist her in coping.   Target Date: 01/25/2023  Progress: 4  Frequency: Once every 3-4 wks  Modality: Claretta Fraise, LMFT

## 2022-12-31 ENCOUNTER — Other Ambulatory Visit: Payer: Self-pay | Admitting: Internal Medicine

## 2022-12-31 DIAGNOSIS — E1165 Type 2 diabetes mellitus with hyperglycemia: Secondary | ICD-10-CM

## 2023-01-03 NOTE — Progress Notes (Signed)
OUTPATIENT PHYSICAL THERAPY LOWER EXTREMITY EVALUATION   Patient Name: Audrey Goodman MRN: [id number] DOB:[birth year], 52 y.o., female Today's Date: 01/04/2023  END OF SESSION:  PT End of Session - 01/04/23 1621     Visit Number 1    Number of Visits 13    Date for PT Re-Evaluation 03/01/23    Progress Note Due on Visit 13    PT Start Time 1346    PT Stop Time 1430    PT Time Calculation (min) 44 min    Activity Tolerance Patient tolerated treatment well;No increased pain;Patient limited by pain    Behavior During Therapy [facility] for tasks assessed/performed             Past Medical History:  Diagnosis Date   Anxiety    Arthritis    left shoulder   Back pain    Chronic kidney disease    stage 1   Depression    Diabetes mellitus without complication (HCC)    type 2- on meds   Diverticulitis    High cholesterol    History of chicken pox    Hyperlipidemia    on meds   Hypertension    on meds   Joint pain    Lactose intolerance    PCOS (polycystic ovarian syndrome)    Seasonal allergies    Shingles    Sleep apnea    does NOT use CPAP   SVD (spontaneous vaginal delivery) 1995   x 1   Type 2 diabetes mellitus, uncontrolled 07/30/2006   Qualifier: Diagnosis of  By: Lanier Prude  MD, Cathrine Muster     Past Surgical History:  Procedure Laterality Date   ANKLE FRACTURE SURGERY  05/28/1989   right   CHOLECYSTECTOMY  05/28/2006   COLONOSCOPY  1989   due to blood in stool and diverticulitis per pt   HYSTEROSCOPY N/A 09/13/2014   Procedure: HYSTEROSCOPY with IUD removal;  Surgeon: Mitchel Honour, DO;  Location: [facility];  Service: Gynecology;  Laterality: N/A;  need longest speculum and instruments that aresavailable due to patient's habitus (BMI ~60)   Patient Active Problem List   Diagnosis Date Noted   Attention deficit hyperactivity disorder, combined type, moderate 03/01/2022   Impacted cerumen of both ears 01/23/2021   Left Achilles tendinitis 06/19/2017   Depression  07/13/2015   Preventative health care 06/23/2014   PCOS (polycystic ovarian syndrome) 09/10/2013   CHOLELITHIASIS 02/20/2008   Hyperlipidemia 08/06/2006   Uncontrolled diabetes mellitus with hyperglycemia (HCC) 07/30/2006   HYPERTENSION, BENIGN ESSENTIAL 07/30/2006   MORBID OBESITY 07/25/2006    PCP: [name], NP  REFERRING PROVIDER: Madelyn Brunner, DO  REFERRING DIAG:  Diagnosis  M25.552 (ICD-10-CM) - Pain in left hip    THERAPY DIAG:  Difficulty in walking, not elsewhere classified  Muscle weakness (generalized)  Localized edema  Pain in left hip  Stiffness of left hip, not elsewhere classified  Rationale for Evaluation and Treatment: Rehabilitation  ONSET DATE: May of this year after a trip to [place]  SUBJECTIVE:   SUBJECTIVE STATEMENT: Audrey Goodman that she has a stationary job working from home.  After a trip to [facility] in March that required a lot of walking, she started a walking exercise program at home.  She very quickly started to have an increase in her left hip, gluteal and groin pain.  PERTINENT HISTORY: Left shoulder OA, chronic kidney disease, type 2 DM, HLD, right ankle fracture '91, morbid obesity  PAIN:  Are you having pain? Yes: NPRS scale:  3-5/10 Pain location: Left hip, groin and gluteals Pain description: Achy, sore, stiff Aggravating factors: Prolonged postures and weightbearing Relieving factors: Change of position  PRECAUTIONS: None  RED FLAGS: None   WEIGHT BEARING RESTRICTIONS: No  FALLS:  Has patient fallen in last 6 months? No  LIVING ENVIRONMENT: Lives with: lives alone Lives in: House/apartment Stairs:  Avoids stairs Has following equipment at home: Single point cane  OCCUPATION: Claims specialist, works from home on a computer  PLOF: Independent  PATIENT GOALS: Return to walking normally without pain with better gait quality.  Be able to walk for exercise.  NEXT MD VISIT: NA  OBJECTIVE:   DIAGNOSTIC  FINDINGS: 2 view radiographs of the left hip shows arthritic changes with  subchondral cyst sclerosis and joint space narrowing.  PATIENT SURVEYS:  FOTO 43, risk adjusted 55 (Goal 61 in 13 visits)  COGNITION: Overall cognitive status: Within functional limits for tasks assessed     SENSATION: No complaints of tingling or numbness  MUSCLE LENGTH: Hamstrings: Right 35 deg; Left 30 deg  POSTURE: flexed trunk    LOWER EXTREMITY ROM:  Passive ROM Left/Right 01/04/2023   Hip flexion 85/85   Hip extension    Hip abduction    Hip adduction    Hip internal rotation 5/4   Hip external rotation 30/30   Knee flexion    Knee extension    Ankle dorsiflexion    Ankle plantarflexion    Ankle inversion    Ankle eversion     (Blank rows = not tested)  LOWER EXTREMITY STRENGTH:  In pounds with hand-held dynamometer for quads and MMT for hip Left/Right 01/03/2023 265 BW  Hip flexion    Hip extension    Hip abduction 3/4   Hip adduction    Hip internal rotation    Hip external rotation    Knee flexion    Knee extension 20.5/74.7   Ankle dorsiflexion    Ankle plantarflexion    Ankle inversion    Ankle eversion     (Blank rows = not tested)   GAIT: Distance walked: 150 feet Assistive device utilized: Single point cane Level of assistance: Complete Independence Comments: Audrey Goodman not using an assistive device before this recent episode   TODAY'S TREATMENT:                                                                                                                              DATE: 01/04/2023 Quadriceps sets with pillow under bilateral knees (toes back, press knees down and tighten thighs) 10 x 5 seconds Clams (lie right, lift left) with black Thera-Band resistance 2 sets of 10 for 3 seconds    PATIENT EDUCATION:  Education details: Reviewed exam findings and home exercise program Person educated: Patient Education method: Explanation, Demonstration, Tactile cues, Verbal  cues, and Handouts Education comprehension: verbalized understanding, returned demonstration, verbal cues required, tactile cues required, and needs further education  HOME EXERCISE PROGRAM: [id number]  ASSESSMENT:  CLINICAL IMPRESSION: Patient is a 52 y.o. female who Goodman seen today for physical therapy evaluation and treatment for  Diagnosis  M25.552 (ICD-10-CM) - Pain in left hip  .  Heylin had an increase in the left gluteal, hip and groin pain after starting an exercise program after a trip to [place] that required a lot of walking.  She had some pain after the [place] trip and thought increasing her walking would help her more rapidly get over this.  Symptoms continued to worsen to the point that she had to move to an assistive device.  Objectively, she has stiff hips consistent with her daily routine of sitting at a desk most of the day at work.  She also has very weak left quadriceps and hip abductors.  Her left knee has also been giving her trouble and this is consistent with her quadriceps weakness on that side.  She will benefit from focusing on left-sided quadriceps and hip abductor strengthening along with appropriate flexibility activities to address specific areas noted in the evaluation (hip flexors and hamstrings emphasis).  OBJECTIVE IMPAIRMENTS: Abnormal gait, decreased activity tolerance, decreased endurance, decreased mobility, difficulty walking, decreased ROM, decreased strength, increased edema, impaired perceived functional ability, impaired flexibility, postural dysfunction, obesity, and pain.   ACTIVITY LIMITATIONS: lifting, bending, sitting, standing, squatting, stairs, and locomotion level  PARTICIPATION LIMITATIONS: community activity and occupation  PERSONAL FACTORS: Left shoulder OA, chronic kidney disease, type 2 DM, HLD, right ankle fracture '91, morbid obesity are also affecting patient's functional outcome.   REHAB POTENTIAL: Good  CLINICAL DECISION MAKING:  Stable/uncomplicated  EVALUATION COMPLEXITY: Low   GOALS: Goals reviewed with patient? Yes  SHORT TERM GOALS: Target date: 01/31/2023 Raiyne will be independent with her day 1 HEP Baseline: Started 01/04/2023 Goal status: INITIAL  2.  Improve bilateral lower extremity flexibility for hip flexors to 95 degrees, hamstrings to 45 degrees and hip external rotation to at least 35 degrees Baseline: 85 degrees, 30 to 35 degrees and 30 degrees, respectively Goal status: INITIAL  3.  Improve left quadricep strength to at least 35 pounds Baseline: 20.5 pounds Goal status: INITIAL   LONG TERM GOALS: Target date: 02/28/2023  Improve FOTO to 61 in 13 visits Baseline: 43, risk adjusted 55 Goal status: INITIAL  2.  Aniaya will report left hip pain consistently 0-2/10 on the Numeric Pain Rating Scale Baseline: 3-5/10 Goal status: INITIAL  3.  Improve hip AROM for external rotation to 40 degrees Baseline: 30 degrees Goal status: INITIAL  4.  Improve left hip strength for hip abductors to at least 4/5 MMT Baseline: 3/5 MMT Goal status: INITIAL  5.  Improve left quadricep strength to at least 50 pounds Baseline: 20.5 pounds Goal status: INITIAL  6.  Adrita will be independent with her long-term maintenance HEP at [place] Baseline: Started 01/04/2023 Goal status: INITIAL   PLAN:  PT FREQUENCY: 1-2x/week  PT DURATION: 8 weeks  PLANNED INTERVENTIONS: Therapeutic exercises, Therapeutic activity, Neuromuscular re-education, Balance training, Gait training, Patient/Family education, Self Care, Stair training, Cryotherapy, Vasopneumatic device, and Manual therapy  PLAN FOR NEXT SESSION: Review day 1 home exercise program and keep her home exercises simple focused on quadriceps strengthening and hip abductor strengthening.  Appropriate strength progressions focused on the above to listed areas along with select flexibility exercises for hip flexors, hamstrings and hip external rotation although the focus  of her home program should remain on strengthening.   Cherlyn Cushing, PT, MPT 01/04/2023, 4:23 PM

## 2023-01-04 ENCOUNTER — Encounter: Payer: Self-pay | Admitting: Rehabilitative and Restorative Service Providers"

## 2023-01-04 ENCOUNTER — Ambulatory Visit: Payer: 59 | Admitting: Rehabilitative and Restorative Service Providers"

## 2023-01-04 DIAGNOSIS — M6281 Muscle weakness (generalized): Secondary | ICD-10-CM

## 2023-01-04 DIAGNOSIS — R262 Difficulty in walking, not elsewhere classified: Secondary | ICD-10-CM

## 2023-01-04 DIAGNOSIS — M25552 Pain in left hip: Secondary | ICD-10-CM

## 2023-01-04 DIAGNOSIS — R600 Localized edema: Secondary | ICD-10-CM | POA: Diagnosis not present

## 2023-01-04 DIAGNOSIS — M25652 Stiffness of left hip, not elsewhere classified: Secondary | ICD-10-CM

## 2023-01-08 ENCOUNTER — Other Ambulatory Visit: Payer: Self-pay | Admitting: Family

## 2023-01-10 ENCOUNTER — Encounter (INDEPENDENT_AMBULATORY_CARE_PROVIDER_SITE_OTHER): Payer: Self-pay

## 2023-01-15 ENCOUNTER — Encounter: Payer: Self-pay | Admitting: Rehabilitative and Restorative Service Providers"

## 2023-01-15 ENCOUNTER — Ambulatory Visit (INDEPENDENT_AMBULATORY_CARE_PROVIDER_SITE_OTHER): Payer: 59 | Admitting: Rehabilitative and Restorative Service Providers"

## 2023-01-15 DIAGNOSIS — R262 Difficulty in walking, not elsewhere classified: Secondary | ICD-10-CM | POA: Diagnosis not present

## 2023-01-15 DIAGNOSIS — R600 Localized edema: Secondary | ICD-10-CM | POA: Diagnosis not present

## 2023-01-15 DIAGNOSIS — M25652 Stiffness of left hip, not elsewhere classified: Secondary | ICD-10-CM

## 2023-01-15 DIAGNOSIS — M25552 Pain in left hip: Secondary | ICD-10-CM | POA: Diagnosis not present

## 2023-01-15 DIAGNOSIS — M6281 Muscle weakness (generalized): Secondary | ICD-10-CM

## 2023-01-15 NOTE — Therapy (Signed)
OUTPATIENT PHYSICAL THERAPY LOWER EXTREMITY TREATMENT     Patient Name: Audrey Goodman MRN: [id number] DOB:04/01/1971, 52 y.o., female Today's Date: 01/04/2023   END OF SESSION:   PT End of Session - 01/04/23 1621       Visit Number 2    Number of Visits 13     Date for PT Re-Evaluation 03/01/23     Progress Note Due on Visit 13     PT Start Time 0848    PT Stop Time 0927    PT Time Calculation (min) 39 min     Activity Tolerance Patient tolerated treatment well;No increased pain    Behavior During Therapy WFL for tasks assessed/performed                       Past Medical History:  Diagnosis Date   Anxiety     Arthritis      left shoulder   Back pain     Chronic kidney disease      stage 1   Depression     Diabetes mellitus without complication (HCC)      type 2- on meds   Diverticulitis     High cholesterol     History of chicken pox     Hyperlipidemia      on meds   Hypertension      on meds   Joint pain     Lactose intolerance     PCOS (polycystic ovarian syndrome)     Seasonal allergies     Shingles     Sleep apnea      does NOT use CPAP   SVD (spontaneous vaginal delivery) 1995    x 1   Type 2 diabetes mellitus, uncontrolled 07/30/2006    Qualifier: Diagnosis of  By: Lanier Prude  MD, Cathrine Muster               Past Surgical History:  Procedure Laterality Date   ANKLE FRACTURE SURGERY   05/28/1989    right   CHOLECYSTECTOMY   05/28/2006   COLONOSCOPY   1989    due to blood in stool and diverticulitis per pt   HYSTEROSCOPY N/A 09/13/2014    Procedure: HYSTEROSCOPY with IUD removal;  Surgeon: Mitchel Honour, DO;  Location: [facility];  Service: Gynecology;  Laterality: N/A;  need longest speculum and instruments that aresavailable due to patient's habitus (BMI ~60)            Patient Active Problem List    Diagnosis Date Noted   Attention deficit hyperactivity disorder, combined type, moderate 03/01/2022   Impacted cerumen of both ears 01/23/2021    Left Achilles tendinitis 06/19/2017   Depression 07/13/2015   Preventative health care 06/23/2014   PCOS (polycystic ovarian syndrome) 09/10/2013   CHOLELITHIASIS 02/20/2008   Hyperlipidemia 08/06/2006   Uncontrolled diabetes mellitus with hyperglycemia (HCC) 07/30/2006   HYPERTENSION, BENIGN ESSENTIAL 07/30/2006   MORBID OBESITY 07/25/2006      PCP: [name], NP   REFERRING PROVIDER: Madelyn Brunner, DO   REFERRING DIAG:  Diagnosis  M25.552 (ICD-10-CM) - Pain in left hip      THERAPY DIAG:  Difficulty in walking, not elsewhere classified   Muscle weakness (generalized)   Localized edema   Pain in left hip   Stiffness of left hip, not elsewhere classified   Rationale for Evaluation and Treatment: Rehabilitation   ONSET DATE: May of this year after a trip to [place]   SUBJECTIVE:  SUBJECTIVE STATEMENT: Audrey Goodman notes excellent starter HEP compliance.  She already notes less pain when twisting in the shower.     Evaluation: After a trip to [facility] in March that required a lot of walking, she started a walking exercise program at home.  She very quickly started to have an increase in her left hip, gluteal and groin pain.   PERTINENT HISTORY: Left shoulder OA, chronic kidney disease, type 2 DM, HLD, right ankle fracture '91, morbid obesity   PAIN:  Are you having pain? Yes: NPRS scale: 2-6/10 Pain location: Left hip, groin and gluteals Pain description: Achy, sore, stiff Aggravating factors: Prolonged postures and weightbearing Relieving factors: Change of position   PRECAUTIONS: None   RED FLAGS: None      WEIGHT BEARING RESTRICTIONS: No   FALLS:  Has patient fallen in last 6 months? No   LIVING ENVIRONMENT: Lives with: lives alone Lives in: House/apartment Stairs:  Avoids stairs Has following equipment at home: Single point cane   OCCUPATION: Claims specialist, works from home on a computer   PLOF: Independent   PATIENT GOALS: Return to walking  normally without pain with better gait quality.  Be able to walk for exercise.   NEXT MD VISIT: NA   OBJECTIVE:    DIAGNOSTIC FINDINGS: 2 view radiographs of the left hip shows arthritic changes with  subchondral cyst sclerosis and joint space narrowing.   PATIENT SURVEYS:  FOTO 43, risk adjusted 55 (Goal 61 in 13 visits)   COGNITION: Overall cognitive status: Within functional limits for tasks assessed                         SENSATION: No complaints of tingling or numbness   MUSCLE LENGTH: Hamstrings: Right 35 deg; Left 30 deg   POSTURE: flexed trunk      LOWER EXTREMITY ROM:   Passive ROM Left/Right 01/04/2023    Hip flexion 85/85    Hip extension      Hip abduction      Hip adduction      Hip internal rotation 5/4    Hip external rotation 30/30    Knee flexion      Knee extension      Ankle dorsiflexion      Ankle plantarflexion      Ankle inversion      Ankle eversion       (Blank rows = not tested)   LOWER EXTREMITY STRENGTH:   In pounds with hand-held dynamometer for quads and MMT for hip Left/Right 01/03/2023 265 BW  Hip flexion      Hip extension      Hip abduction 3/4    Hip adduction      Hip internal rotation      Hip external rotation      Knee flexion      Knee extension 20.5/74.7    Ankle dorsiflexion      Ankle plantarflexion      Ankle inversion      Ankle eversion       (Blank rows = not tested)     GAIT: Distance walked: 150 feet Assistive device utilized: Single point cane Level of assistance: Complete Independence Comments: Audrey Goodman was not using an assistive device before this recent episode     TODAY'S TREATMENT:  DATE:  01/15/2023 Quadriceps sets with pillow under bilateral knees (toes back, press knees down and tighten thighs) 10 x 5 seconds Clams (lie right, lift left) with black Thera-Band resistance 2 sets  of 10 for 3 seconds Single knee to chest stretch 4 x 20 seconds bilateral Hamstrings stretch with strap 4 x 20 seconds bilateral Yoga bridge 10 x 5 seconds  Neuromuscular re-education: Tandem balance 4 x 20 seconds bilateral  Functional Activities: Double Leg Press 100# 15 x slow eccentrics Single Leg Press 50# 10 x slow eccentrics   01/04/2023 Quadriceps sets with pillow under bilateral knees (toes back, press knees down and tighten thighs) 10 x 5 seconds Clams (lie right, lift left) with black Thera-Band resistance 2 sets of 10 for 3 seconds     PATIENT EDUCATION:  Education details: Reviewed exam findings and home exercise program Person educated: Patient Education method: Explanation, Demonstration, Tactile cues, Verbal cues, and Handouts Education comprehension: verbalized understanding, returned demonstration, verbal cues required, tactile cues required, and needs further education   HOME EXERCISE PROGRAM: [id number]   ASSESSMENT:   CLINICAL IMPRESSION: Audrey Goodman is doing an excellent job with her early HEP compliance.  She already notes less pain when moving around in the shower.  She will benefit from the recommended course of strengthening and appropriate stretching to meet long-term goals.   Evaluation: Audrey Goodman had an increase in the left gluteal, hip and groin pain after starting an exercise program after a trip to [place] that required a lot of walking.  She had some pain after the [place] trip and thought increasing her walking would help her more rapidly get over this.  Symptoms continued to worsen to the point that she had to move to an assistive device.  Objectively, she has stiff hips consistent with her daily routine of sitting at a desk most of the day at work.  She also has very weak left quadriceps and hip abductors.  Her left knee has also been giving her trouble and this is consistent with her quadriceps weakness on that side.  She will benefit from focusing on left-sided  quadriceps and hip abductor strengthening along with appropriate flexibility activities to address specific areas noted in the evaluation (hip flexors and hamstrings emphasis).   OBJECTIVE IMPAIRMENTS: Abnormal gait, decreased activity tolerance, decreased endurance, decreased mobility, difficulty walking, decreased ROM, decreased strength, increased edema, impaired perceived functional ability, impaired flexibility, postural dysfunction, obesity, and pain.    ACTIVITY LIMITATIONS: lifting, bending, sitting, standing, squatting, stairs, and locomotion level   PARTICIPATION LIMITATIONS: community activity and occupation   PERSONAL FACTORS: Left shoulder OA, chronic kidney disease, type 2 DM, HLD, right ankle fracture '91, morbid obesity are also affecting patient's functional outcome.    REHAB POTENTIAL: Good   CLINICAL DECISION MAKING: Stable/uncomplicated   EVALUATION COMPLEXITY: Low     GOALS: Goals reviewed with patient? Yes   SHORT TERM GOALS: Target date: 01/31/2023 Audrey Goodman will be independent with her day 1 HEP Baseline: Started 01/04/2023 Goal status: Met 01/15/2023   2.  Improve bilateral lower extremity flexibility for hip flexors to 95 degrees, hamstrings to 45 degrees and hip external rotation to at least 35 degrees Baseline: 85 degrees, 30 to 35 degrees and 30 degrees, respectively Goal status: INITIAL   3.  Improve left quadricep strength to at least 35 pounds Baseline: 20.5 pounds Goal status: INITIAL     LONG TERM GOALS: Target date: 02/28/2023   Improve FOTO to 61 in 13 visits Baseline: 43, risk adjusted  55 Goal status: INITIAL   2.  Audrey Goodman will report left hip pain consistently 0-2/10 on the Numeric Pain Rating Scale Baseline: 3-5/10 Goal status: On Going 01/15/2023   3.  Improve hip AROM for external rotation to 40 degrees Baseline: 30 degrees Goal status: INITIAL   4.  Improve left hip strength for hip abductors to at least 4/5 MMT Baseline: 3/5 MMT Goal  status: INITIAL   5.  Improve left quadricep strength to at least 50 pounds Baseline: 20.5 pounds Goal status: INITIAL   6.  Audrey Goodman will be independent with her long-term maintenance HEP at DC Baseline: Started 01/04/2023 Goal status: INITIAL     PLAN:   PT FREQUENCY: 1-2x/week   PT DURATION: 8 weeks   PLANNED INTERVENTIONS: Therapeutic exercises, Therapeutic activity, Neuromuscular re-education, Balance training, Gait training, Patient/Family education, Self Care, Stair training, Cryotherapy, Vasopneumatic device, and Manual therapy   PLAN FOR NEXT SESSION: Review home exercise program and keep her home exercises simple focused on quadriceps strengthening and hip abductor strengthening.  Appropriate strength progressions focused on the above to listed areas along with select flexibility exercises for hip external rotation although the focus of her home program should remain on strengthening.     Cherlyn Cushing, PT, MPT 01/04/2023, 4:23 PM

## 2023-01-18 ENCOUNTER — Ambulatory Visit: Payer: 59 | Admitting: Behavioral Health

## 2023-01-21 LAB — HM DIABETES EYE EXAM

## 2023-01-22 ENCOUNTER — Other Ambulatory Visit: Payer: Self-pay | Admitting: Family

## 2023-01-22 DIAGNOSIS — F902 Attention-deficit hyperactivity disorder, combined type: Secondary | ICD-10-CM

## 2023-01-23 MED ORDER — AMPHETAMINE-DEXTROAMPHETAMINE 10 MG PO TABS: 5.0000 mg | ORAL_TABLET | Freq: Every day | ORAL | 0 refills | Status: DC | PRN

## 2023-01-23 MED ORDER — AMPHETAMINE-DEXTROAMPHET ER 30 MG PO CP24
30.0000 mg | ORAL_CAPSULE | ORAL | 0 refills | Status: AC
Start: 2023-01-23 — End: ?

## 2023-01-23 NOTE — Telephone Encounter (Signed)
Patient is requesting refills.  ADDERALL 10 MG last filled on 11/19/22 ADDERALL XR 30 MG last filled on 12/10/22  Last office visit 07/16/22

## 2023-01-24 ENCOUNTER — Ambulatory Visit: Payer: 59 | Admitting: Rehabilitative and Restorative Service Providers"

## 2023-01-24 ENCOUNTER — Encounter: Payer: Self-pay | Admitting: Rehabilitative and Restorative Service Providers"

## 2023-01-24 DIAGNOSIS — R600 Localized edema: Secondary | ICD-10-CM

## 2023-01-24 DIAGNOSIS — R262 Difficulty in walking, not elsewhere classified: Secondary | ICD-10-CM

## 2023-01-24 DIAGNOSIS — M6281 Muscle weakness (generalized): Secondary | ICD-10-CM | POA: Diagnosis not present

## 2023-01-24 DIAGNOSIS — M25552 Pain in left hip: Secondary | ICD-10-CM

## 2023-01-24 DIAGNOSIS — M25652 Stiffness of left hip, not elsewhere classified: Secondary | ICD-10-CM

## 2023-01-24 NOTE — Therapy (Signed)
OUTPATIENT PHYSICAL THERAPY LOWER EXTREMITY TREATMENT     Patient Name: Audrey Goodman MRN: [id number] DOB:July 17, 1970, 52 y.o., female Today's Date: 01/04/2023   END OF SESSION:   PT End of Session - 01/04/23 1621       Visit Number 3    Number of Visits 13     Date for PT Re-Evaluation 03/01/23     Progress Note Due on Visit 13     PT Start Time 1431     PT Stop Time 1515    PT Time Calculation (min) 44 min     Activity Tolerance Patient tolerated treatment well;No increased pain    Behavior During Therapy WFL for tasks assessed/performed                       Past Medical History:  Diagnosis Date   Anxiety     Arthritis      left shoulder   Back pain     Chronic kidney disease      stage 1   Depression     Diabetes mellitus without complication (HCC)      type 2- on meds   Diverticulitis     High cholesterol     History of chicken pox     Hyperlipidemia      on meds   Hypertension      on meds   Joint pain     Lactose intolerance     PCOS (polycystic ovarian syndrome)     Seasonal allergies     Shingles     Sleep apnea      does NOT use CPAP   SVD (spontaneous vaginal delivery) 1995    x 1   Type 2 diabetes mellitus, uncontrolled 07/30/2006    Qualifier: Diagnosis of  By: Lanier Prude  MD, Cathrine Muster               Past Surgical History:  Procedure Laterality Date   ANKLE FRACTURE SURGERY   05/28/1989    right   CHOLECYSTECTOMY   05/28/2006   COLONOSCOPY   1989    due to blood in stool and diverticulitis per pt   HYSTEROSCOPY N/A 09/13/2014    Procedure: HYSTEROSCOPY with IUD removal;  Surgeon: Mitchel Honour, DO;  Location: [facility];  Service: Gynecology;  Laterality: N/A;  need longest speculum and instruments that aresavailable due to patient's habitus (BMI ~60)            Patient Active Problem List    Diagnosis Date Noted   Attention deficit hyperactivity disorder, combined type, moderate 03/01/2022   Impacted cerumen of both ears 01/23/2021    Left Achilles tendinitis 06/19/2017   Depression 07/13/2015   Preventative health care 06/23/2014   PCOS (polycystic ovarian syndrome) 09/10/2013   CHOLELITHIASIS 02/20/2008   Hyperlipidemia 08/06/2006   Uncontrolled diabetes mellitus with hyperglycemia (HCC) 07/30/2006   HYPERTENSION, BENIGN ESSENTIAL 07/30/2006   MORBID OBESITY 07/25/2006      PCP: [name], NP   REFERRING PROVIDER: Madelyn Brunner, DO   REFERRING DIAG:  Diagnosis  M25.552 (ICD-10-CM) - Pain in left hip      THERAPY DIAG:  Difficulty in walking, not elsewhere classified   Muscle weakness (generalized)   Localized edema   Pain in left hip   Stiffness of left hip, not elsewhere classified   Rationale for Evaluation and Treatment: Rehabilitation   ONSET DATE: May of this year after a trip to [place]   SUBJECTIVE:  SUBJECTIVE STATEMENT: Audrey Goodman notes her HEP compliance was good up to an eye doctor visit that involved 2 hours of sitting and left her flared-up.  She does still note less pain when twisting in the shower.     Evaluation: After a trip to [facility] in March that required a lot of walking, she started a walking exercise program at home.  She very quickly started to have an increase in her left hip, gluteal and groin pain.   PERTINENT HISTORY: Left shoulder OA, chronic kidney disease, type 2 DM, HLD, right ankle fracture '91, morbid obesity   PAIN:  Are you having pain? Yes: NPRS scale: 2-6/10 Pain location: Left hip, groin and gluteals Pain description: Achy, sore, stiff Aggravating factors: Prolonged postures and weightbearing Relieving factors: Change of position   PRECAUTIONS: None   RED FLAGS: None      WEIGHT BEARING RESTRICTIONS: No   FALLS:  Has patient fallen in last 6 months? No   LIVING ENVIRONMENT: Lives with: lives alone Lives in: House/apartment Stairs:  Avoids stairs Has following equipment at home: Single point cane   OCCUPATION: Claims specialist, works from  home on a computer   PLOF: Independent   PATIENT GOALS: Return to walking normally without pain with better gait quality.  Be able to walk for exercise.   NEXT MD VISIT: NA   OBJECTIVE:    DIAGNOSTIC FINDINGS: 2 view radiographs of the left hip shows arthritic changes with  subchondral cyst sclerosis and joint space narrowing.   PATIENT SURVEYS:  FOTO 43, risk adjusted 55 (Goal 61 in 13 visits)   COGNITION: Overall cognitive status: Within functional limits for tasks assessed                         SENSATION: No complaints of tingling or numbness   MUSCLE LENGTH: Hamstrings: Right 35 deg; Left 30 deg   POSTURE: flexed trunk      LOWER EXTREMITY ROM:   Passive ROM Left/Right 01/04/2023    Hip flexion 85/85    Hip extension      Hip abduction      Hip adduction      Hip internal rotation 5/4    Hip external rotation 30/30    Knee flexion      Knee extension      Ankle dorsiflexion      Ankle plantarflexion      Ankle inversion      Ankle eversion       (Blank rows = not tested)   LOWER EXTREMITY STRENGTH:   In pounds with hand-held dynamometer for quads and MMT for hip Left/Right 01/03/2023 265 BW Left/Right 01/24/2023  Hip flexion      Hip extension      Hip abduction 3/4  deferred  Hip adduction      Hip internal rotation      Hip external rotation      Knee flexion      Knee extension 20.5/74.7 32.6/deferred   Ankle dorsiflexion      Ankle plantarflexion      Ankle inversion      Ankle eversion       (Blank rows = not tested)     GAIT: Distance walked: 150 feet Assistive device utilized: Single point cane Level of assistance: Complete Independence Comments: Audrey Goodman was not using an assistive device before this recent episode     TODAY'S TREATMENT:  DATE:  01/24/2023 Quadriceps sets with pillow under bilateral knees (toes back,  press knees down and tighten thighs) 10 x 5 seconds Clams (lie right, lift left) with black Thera-Band resistance 2 sets of 10 for 3 seconds Single knee to chest stretch 4 x 20 seconds bilateral Hamstrings stretch with strap 4 x 20 seconds bilateral Yoga bridge 10 x 5 seconds Modified Thomas stretch 2 x 20 second Hip hike at counter top 10 x 3 seconds  Neuromuscular re-education: Tandem balance 4 x 20 seconds bilateral  Functional Activities: Double Leg Press 112# 12 x slow eccentrics Single Leg Press 56# 10 x slow eccentrics   01/15/2023 Quadriceps sets with pillow under bilateral knees (toes back, press knees down and tighten thighs) 10 x 5 seconds Clams (lie right, lift left) with black Thera-Band resistance 2 sets of 10 for 3 seconds Single knee to chest stretch 4 x 20 seconds bilateral Hamstrings stretch with strap 4 x 20 seconds bilateral Yoga bridge 10 x 5 seconds  Neuromuscular re-education: Tandem balance 4 x 20 seconds bilateral  Functional Activities: Double Leg Press 100# 15 x slow eccentrics Single Leg Press 50# 10 x slow eccentrics   01/04/2023 Quadriceps sets with pillow under bilateral knees (toes back, press knees down and tighten thighs) 10 x 5 seconds Clams (lie right, lift left) with black Thera-Band resistance 2 sets of 10 for 3 seconds     PATIENT EDUCATION:  Education details: Reviewed exam findings and home exercise program Person educated: Patient Education method: Explanation, Demonstration, Tactile cues, Verbal cues, and Handouts Education comprehension: verbalized understanding, returned demonstration, verbal cues required, tactile cues required, and needs further education   HOME EXERCISE PROGRAM: [id number]   ASSESSMENT:   CLINICAL IMPRESSION: Other than a set-back due to prolonged sitting at a doctor's appointment, Audrey Goodman is doing an excellent job with her HEP compliance.  Effort in the office is outstanding and she is making early objective  strength and functional gains.  Although better, strength is still poor and Audrey Goodman will benefit from the recommended course of rehabilitation.    Evaluation: Audrey Goodman had an increase in the left gluteal, hip and groin pain after starting an exercise program after a trip to [place] that required a lot of walking.  She had some pain after the [place] trip and thought increasing her walking would help her more rapidly get over this.  Symptoms continued to worsen to the point that she had to move to an assistive device.  Objectively, she has stiff hips consistent with her daily routine of sitting at a desk most of the day at work.  She also has very weak left quadriceps and hip abductors.  Her left knee has also been giving her trouble and this is consistent with her quadriceps weakness on that side.  She will benefit from focusing on left-sided quadriceps and hip abductor strengthening along with appropriate flexibility activities to address specific areas noted in the evaluation (hip flexors and hamstrings emphasis).   OBJECTIVE IMPAIRMENTS: Abnormal gait, decreased activity tolerance, decreased endurance, decreased mobility, difficulty walking, decreased ROM, decreased strength, increased edema, impaired perceived functional ability, impaired flexibility, postural dysfunction, obesity, and pain.    ACTIVITY LIMITATIONS: lifting, bending, sitting, standing, squatting, stairs, and locomotion level   PARTICIPATION LIMITATIONS: community activity and occupation   PERSONAL FACTORS: Left shoulder OA, chronic kidney disease, type 2 DM, HLD, right ankle fracture '91, morbid obesity are also affecting patient's functional outcome.    REHAB POTENTIAL: Good   CLINICAL DECISION  MAKING: Stable/uncomplicated   EVALUATION COMPLEXITY: Low     GOALS: Goals reviewed with patient? Yes   SHORT TERM GOALS: Target date: 01/31/2023 Audrey Goodman will be independent with her day 1 HEP Baseline: Started 01/04/2023 Goal status: Met  01/15/2023   2.  Improve bilateral lower extremity flexibility for hip flexors to 95 degrees, hamstrings to 45 degrees and hip external rotation to at least 35 degrees Baseline: 85 degrees, 30 to 35 degrees and 30 degrees, respectively Goal status: INITIAL   3.  Improve left quadriceps strength to at least 35 pounds Baseline: 20.5 pounds Goal status: On Going 01/24/2023     LONG TERM GOALS: Target date: 02/28/2023   Improve FOTO to 61 in 13 visits Baseline: 43, risk adjusted 55 Goal status: INITIAL   2.  Audrey Goodman will report left hip pain consistently 0-2/10 on the Numeric Pain Rating Scale Baseline: 3-5/10 Goal status: On Going 01/24/2023   3.  Improve hip AROM for external rotation to 40 degrees Baseline: 30 degrees Goal status: INITIAL   4.  Improve left hip strength for hip abductors to at least 4/5 MMT Baseline: 3/5 MMT Goal status: INITIAL   5.  Improve left quadriceps strength to at least 50 pounds Baseline: 20.5 pounds Goal status: INITIAL   6.  Audrey Goodman will be independent with her long-term maintenance HEP at DC Baseline: Started 01/04/2023 Goal status: INITIAL     PLAN:   PT FREQUENCY: 1-2x/week   PT DURATION: 8 weeks   PLANNED INTERVENTIONS: Therapeutic exercises, Therapeutic activity, Neuromuscular re-education, Balance training, Gait training, Patient/Family education, Self Care, Stair training, Cryotherapy, Vasopneumatic device, and Manual therapy   PLAN FOR NEXT SESSION: Review home exercise program and keep her home exercises simple focused on quadriceps and hip abductors strengthening.  Appropriate strength progressions focused on the above to listed areas along with select flexibility exercises for hip external rotation although the focus of her home program should remain on strengthening.  FOTO and progress note soon.     Cherlyn Cushing, PT, MPT 01/04/2023, 4:23 PM

## 2023-01-31 ENCOUNTER — Ambulatory Visit (INDEPENDENT_AMBULATORY_CARE_PROVIDER_SITE_OTHER): Payer: 59 | Admitting: Rehabilitative and Restorative Service Providers"

## 2023-01-31 ENCOUNTER — Encounter: Payer: Self-pay | Admitting: Rehabilitative and Restorative Service Providers"

## 2023-01-31 DIAGNOSIS — M6281 Muscle weakness (generalized): Secondary | ICD-10-CM | POA: Diagnosis not present

## 2023-01-31 DIAGNOSIS — R600 Localized edema: Secondary | ICD-10-CM

## 2023-01-31 DIAGNOSIS — R262 Difficulty in walking, not elsewhere classified: Secondary | ICD-10-CM | POA: Diagnosis not present

## 2023-01-31 DIAGNOSIS — M25552 Pain in left hip: Secondary | ICD-10-CM

## 2023-01-31 DIAGNOSIS — M25652 Stiffness of left hip, not elsewhere classified: Secondary | ICD-10-CM

## 2023-01-31 NOTE — Therapy (Signed)
OUTPATIENT PHYSICAL THERAPY LOWER EXTREMITY TREATMENT     Patient Name: Audrey Goodman MRN: [id number] DOB:[birth year], 52 y.o., female Today's Date: 01/04/2023   END OF SESSION:   PT End of Session - 01/04/23 1621       Visit Number 4    Number of Visits 13     Date for PT Re-Evaluation 03/01/23     Progress Note Due on Visit 13     PT Start Time 1517    1601     PT Time Calculation (min) 44 min     Activity Tolerance Patient tolerated treatment well;No increased pain    Behavior During Therapy WFL for tasks assessed/performed                       Past Medical History:  Diagnosis Date   Anxiety     Arthritis      left shoulder   Back pain     Chronic kidney disease      stage 1   Depression     Diabetes mellitus without complication (HCC)      type 2- on meds   Diverticulitis     High cholesterol     History of chicken pox     Hyperlipidemia      on meds   Hypertension      on meds   Joint pain     Lactose intolerance     PCOS (polycystic ovarian syndrome)     Seasonal allergies     Shingles     Sleep apnea      does NOT use CPAP   SVD (spontaneous vaginal delivery) 1995    x 1   Type 2 diabetes mellitus, uncontrolled 07/30/2006    Qualifier: Diagnosis of  By: Lanier Prude  MD, Cathrine Muster               Past Surgical History:  Procedure Laterality Date   ANKLE FRACTURE SURGERY   05/28/1989    right   CHOLECYSTECTOMY   05/28/2006   COLONOSCOPY   1989    due to blood in stool and diverticulitis per pt   HYSTEROSCOPY N/A 09/13/2014    Procedure: HYSTEROSCOPY with IUD removal;  Surgeon: Mitchel Honour, DO;  Location: [facility];  Service: Gynecology;  Laterality: N/A;  need longest speculum and instruments that aresavailable due to patient's habitus (BMI ~60)            Patient Active Problem List    Diagnosis Date Noted   Attention deficit hyperactivity disorder, combined type, moderate 03/01/2022   Impacted cerumen of both ears 01/23/2021   Left Achilles  tendinitis 06/19/2017   Depression 07/13/2015   Preventative health care 06/23/2014   PCOS (polycystic ovarian syndrome) 09/10/2013   CHOLELITHIASIS 02/20/2008   Hyperlipidemia 08/06/2006   Uncontrolled diabetes mellitus with hyperglycemia (HCC) 07/30/2006   HYPERTENSION, BENIGN ESSENTIAL 07/30/2006   MORBID OBESITY 07/25/2006      PCP: [name], NP   REFERRING PROVIDER: Madelyn Brunner, DO   REFERRING DIAG:  Diagnosis  M25.552 (ICD-10-CM) - Pain in left hip      THERAPY DIAG:  Difficulty in walking, not elsewhere classified   Muscle weakness (generalized)   Localized edema   Pain in left hip   Stiffness of left hip, not elsewhere classified   Rationale for Evaluation and Treatment: Rehabilitation   ONSET DATE: May of this year after a trip to [place]   SUBJECTIVE:    SUBJECTIVE  STATEMENT: Audrey Goodman notes her left hip has been bothering her since her 2 hour eye doctor appointment.    Evaluation: After a trip to [facility] in March that required a lot of walking, she started a walking exercise program at home.  She very quickly started to have an increase in her left hip, gluteal and groin pain.   PERTINENT HISTORY: Left shoulder OA, chronic kidney disease, type 2 DM, HLD, right ankle fracture '91, morbid obesity   PAIN:  Are you having pain? Yes: NPRS scale: 2-6/10 Pain location: Left hip, groin and gluteals Pain description: Achy, sore, stiff Aggravating factors: Prolonged postures and weightbearing Relieving factors: Change of position   PRECAUTIONS: None   RED FLAGS: None      WEIGHT BEARING RESTRICTIONS: No   FALLS:  Has patient fallen in last 6 months? No   LIVING ENVIRONMENT: Lives with: lives alone Lives in: House/apartment Stairs:  Avoids stairs Has following equipment at home: Single point cane   OCCUPATION: Claims specialist, works from home on a computer   PLOF: Independent   PATIENT GOALS: Return to walking normally without pain with  better gait quality.  Be able to walk for exercise.   NEXT MD VISIT: NA   OBJECTIVE:    DIAGNOSTIC FINDINGS: 2 view radiographs of the left hip shows arthritic changes with  subchondral cyst sclerosis and joint space narrowing.   PATIENT SURVEYS:  FOTO 43, risk adjusted 55 (Goal 61 in 13 visits)   COGNITION: Overall cognitive status: Within functional limits for tasks assessed                         SENSATION: No complaints of tingling or numbness   MUSCLE LENGTH: Hamstrings: Right 35 deg; Left 30 deg   POSTURE: flexed trunk      LOWER EXTREMITY ROM:   Passive ROM Left/Right 01/04/2023    Hip flexion 85/85    Hip extension      Hip abduction      Hip adduction      Hip internal rotation 5/4    Hip external rotation 30/30    Knee flexion      Knee extension      Ankle dorsiflexion      Ankle plantarflexion      Ankle inversion      Ankle eversion       (Blank rows = not tested)   LOWER EXTREMITY STRENGTH:   In pounds with hand-held dynamometer for quads and MMT for hip Left/Right 01/03/2023 265 BW Left/Right 01/24/2023  Hip flexion      Hip extension      Hip abduction 3/4  deferred  Hip adduction      Hip internal rotation      Hip external rotation      Knee flexion      Knee extension 20.5/74.7 32.6/deferred   Ankle dorsiflexion      Ankle plantarflexion      Ankle inversion      Ankle eversion       (Blank rows = not tested)     GAIT: Distance walked: 150 feet Assistive device utilized: Single point cane Level of assistance: Complete Independence Comments: Audrey Goodman was not using an assistive device before this recent episode     TODAY'S TREATMENT:  DATE:  01/31/2023 Trunk extension AROM 10 x 3 seconds in comfortable range (hips forward) Quadriceps sets with pillow under bilateral knees (toes back, press knees down and tighten  thighs) 10 x 5 seconds Clams (supine) with black Thera-Band resistance 10 for 3 seconds Single knee to chest stretch 4 x 20 seconds bilateral Hamstrings stretch with strap 4 x 20 seconds bilateral Yoga bridge 12 x 5 seconds Left side only gluteal stretch (knee to opposite shoulder) 4 x 20 seconds Figure 4 stretch left side only 4 x 20 seconds Hip hike at counter top 10 x 3 seconds   Functional Activities: Double Leg Press 112# 15 x slow eccentrics Single Leg Press 56# 10 x slow eccentrics Discussion on log roll, avoiding prolonged and flexed postures and spine anatomy with the model   01/24/2023 Quadriceps sets with pillow under bilateral knees (toes back, press knees down and tighten thighs) 10 x 5 seconds Clams (lie right, lift left) with black Thera-Band resistance 2 sets of 10 for 3 seconds Single knee to chest stretch 4 x 20 seconds bilateral Hamstrings stretch with strap 4 x 20 seconds bilateral Yoga bridge 10 x 5 seconds Modified Thomas stretch 2 x 20 second Hip hike at counter top 10 x 3 seconds  Neuromuscular re-education: Tandem balance 4 x 20 seconds bilateral  Functional Activities: Double Leg Press 112# 12 x slow eccentrics Single Leg Press 56# 10 x slow eccentrics   01/15/2023 Quadriceps sets with pillow under bilateral knees (toes back, press knees down and tighten thighs) 10 x 5 seconds Clams (lie right, lift left) with black Thera-Band resistance 2 sets of 10 for 3 seconds Single knee to chest stretch 4 x 20 seconds bilateral Hamstrings stretch with strap 4 x 20 seconds bilateral Yoga bridge 10 x 5 seconds  Neuromuscular re-education: Tandem balance 4 x 20 seconds bilateral  Functional Activities: Double Leg Press 100# 15 x slow eccentrics Single Leg Press 50# 10 x slow eccentrics   PATIENT EDUCATION:  Education details: Reviewed exam findings and home exercise program Person educated: Patient Education method: Explanation, Demonstration, Tactile  cues, Verbal cues, and Handouts Education comprehension: verbalized understanding, returned demonstration, verbal cues required, tactile cues required, and needs further education   HOME EXERCISE PROGRAM: [id number]   ASSESSMENT:   CLINICAL IMPRESSION: Her 2 hour sitting episode at the eye doctor does appear to have set-back [place].  Palpation revealed a tender left trochanteric bursa and she may be getting some radicular symptoms on the left side as distal as the knee.  We started some spine and postural education and will add core strengthening to her current program as appropriate.  Although better, strength is still poor and Karri will benefit from the recommended course of rehabilitation.    Evaluation: Wannie had an increase in the left gluteal, hip and groin pain after starting an exercise program after a trip to [place] that required a lot of walking.  She had some pain after the [place] trip and thought increasing her walking would help her more rapidly get over this.  Symptoms continued to worsen to the point that she had to move to an assistive device.  Objectively, she has stiff hips consistent with her daily routine of sitting at a desk most of the day at work.  She also has very weak left quadriceps and hip abductors.  Her left knee has also been giving her trouble and this is consistent with her quadriceps weakness on that side.  She will benefit from  focusing on left-sided quadriceps and hip abductor strengthening along with appropriate flexibility activities to address specific areas noted in the evaluation (hip flexors and hamstrings emphasis).   OBJECTIVE IMPAIRMENTS: Abnormal gait, decreased activity tolerance, decreased endurance, decreased mobility, difficulty walking, decreased ROM, decreased strength, increased edema, impaired perceived functional ability, impaired flexibility, postural dysfunction, obesity, and pain.    ACTIVITY LIMITATIONS: lifting, bending, sitting, standing,  squatting, stairs, and locomotion level   PARTICIPATION LIMITATIONS: community activity and occupation   PERSONAL FACTORS: Left shoulder OA, chronic kidney disease, type 2 DM, HLD, right ankle fracture '91, morbid obesity are also affecting patient's functional outcome.    REHAB POTENTIAL: Good   CLINICAL DECISION MAKING: Stable/uncomplicated   EVALUATION COMPLEXITY: Low     GOALS: Goals reviewed with patient? Yes   SHORT TERM GOALS: Target date: 01/31/2023 Jesica will be independent with her day 1 HEP Baseline: Started 01/04/2023 Goal status: Met 01/15/2023   2.  Improve bilateral lower extremity flexibility for hip flexors to 95 degrees, hamstrings to 45 degrees and hip external rotation to at least 35 degrees Baseline: 85 degrees, 30 to 35 degrees and 30 degrees, respectively Goal status: INITIAL   3.  Improve left quadriceps strength to at least 35 pounds Baseline: 20.5 pounds Goal status: On Going 01/24/2023     LONG TERM GOALS: Target date: 02/28/2023   Improve FOTO to 61 in 13 visits Baseline: 43, risk adjusted 55 Goal status: INITIAL   2.  Yatzari will report left hip pain consistently 0-2/10 on the Numeric Pain Rating Scale Baseline: 3-5/10 Goal status: On Going 01/31/2023   3.  Improve hip AROM for external rotation to 40 degrees Baseline: 30 degrees Goal status: INITIAL   4.  Improve left hip strength for hip abductors to at least 4/5 MMT Baseline: 3/5 MMT Goal status: INITIAL   5.  Improve left quadriceps strength to at least 50 pounds Baseline: 20.5 pounds Goal status: INITIAL   6.  Morgandy will be independent with her long-term maintenance HEP at [place] Baseline: Started 01/04/2023 Goal status: INITIAL     PLAN:   PT FREQUENCY: 1-2x/week   PT DURATION: 8 weeks   PLANNED INTERVENTIONS: Therapeutic exercises, Therapeutic activity, Neuromuscular re-education, Balance training, Gait training, Patient/Family education, Self Care, Stair training, Cryotherapy,  Vasopneumatic device, and Manual therapy   PLAN FOR NEXT SESSION: Keep her home exercises simple focused on quadriceps, low back and hip abductors strengthening.  Appropriate strength progressions focused on the above to listed areas along with select flexibility exercises for hip external rotation although the focus of her home program should remain on strengthening.  FOTO and progress note next visit.     Cherlyn Cushing, PT, MPT 01/04/2023, 4:23 PM

## 2023-02-04 ENCOUNTER — Encounter: Payer: Self-pay | Admitting: Sports Medicine

## 2023-02-05 ENCOUNTER — Other Ambulatory Visit: Payer: Self-pay | Admitting: Internal Medicine

## 2023-02-06 ENCOUNTER — Encounter: Payer: Self-pay | Admitting: Sports Medicine

## 2023-02-06 NOTE — Progress Notes (Signed)
   Patient sent over this information for [name] to review. Permission granted by patient to upload. Previous MRI Lumbar from [facility].  DB

## 2023-02-07 ENCOUNTER — Ambulatory Visit: Payer: 59 | Admitting: Rehabilitative and Restorative Service Providers"

## 2023-02-07 ENCOUNTER — Encounter: Payer: Self-pay | Admitting: Rehabilitative and Restorative Service Providers"

## 2023-02-07 DIAGNOSIS — R600 Localized edema: Secondary | ICD-10-CM

## 2023-02-07 DIAGNOSIS — R262 Difficulty in walking, not elsewhere classified: Secondary | ICD-10-CM | POA: Diagnosis not present

## 2023-02-07 DIAGNOSIS — M25552 Pain in left hip: Secondary | ICD-10-CM

## 2023-02-07 DIAGNOSIS — M25652 Stiffness of left hip, not elsewhere classified: Secondary | ICD-10-CM

## 2023-02-07 DIAGNOSIS — M6281 Muscle weakness (generalized): Secondary | ICD-10-CM | POA: Diagnosis not present

## 2023-02-07 NOTE — Therapy (Signed)
OUTPATIENT PHYSICAL THERAPY LOWER EXTREMITY TREATMENT/PROGRESS NOTE     Patient Name: Audrey Goodman MRN: [id number] DOB:Oct 01, 1970, 52 y.o., female Today's Date: 01/04/2023   END OF SESSION:   PT End of Session - 01/04/23 1621       Visit Number 5    Number of Visits 13     Date for PT Re-Evaluation 03/01/23     Progress Note Due on Visit 13     PT Start Time 1520     PT End Time 1601    PT Time Calculation (min) 41 min     Activity Tolerance Patient tolerated treatment well;No increased pain    Behavior During Therapy WFL for tasks assessed/performed                       Past Medical History:  Diagnosis Date   Anxiety     Arthritis      left shoulder   Back pain     Chronic kidney disease      stage 1   Depression     Diabetes mellitus without complication (HCC)      type 2- on meds   Diverticulitis     High cholesterol     History of chicken pox     Hyperlipidemia      on meds   Hypertension      on meds   Joint pain     Lactose intolerance     PCOS (polycystic ovarian syndrome)     Seasonal allergies     Shingles     Sleep apnea      does NOT use CPAP   SVD (spontaneous vaginal delivery) 1995    x 1   Type 2 diabetes mellitus, uncontrolled 07/30/2006    Qualifier: Diagnosis of  By: Lanier Prude  MD, Cathrine Muster               Past Surgical History:  Procedure Laterality Date   ANKLE FRACTURE SURGERY   05/28/1989    right   CHOLECYSTECTOMY   05/28/2006   COLONOSCOPY   1989    due to blood in stool and diverticulitis per pt   HYSTEROSCOPY N/A 09/13/2014    Procedure: HYSTEROSCOPY with IUD removal;  Surgeon: Mitchel Honour, DO;  Location: [facility];  Service: Gynecology;  Laterality: N/A;  need longest speculum and instruments that aresavailable due to patient's habitus (BMI ~60)            Patient Active Problem List    Diagnosis Date Noted   Attention deficit hyperactivity disorder, combined type, moderate 03/01/2022   Impacted cerumen of both ears  01/23/2021   Left Achilles tendinitis 06/19/2017   Depression 07/13/2015   Preventative health care 06/23/2014   PCOS (polycystic ovarian syndrome) 09/10/2013   CHOLELITHIASIS 02/20/2008   Hyperlipidemia 08/06/2006   Uncontrolled diabetes mellitus with hyperglycemia (HCC) 07/30/2006   HYPERTENSION, BENIGN ESSENTIAL 07/30/2006   MORBID OBESITY 07/25/2006      PCP: [name], NP   REFERRING PROVIDER: Madelyn Brunner, DO   REFERRING DIAG:  Diagnosis  M25.552 (ICD-10-CM) - Pain in left hip      THERAPY DIAG:  Difficulty in walking, not elsewhere classified   Muscle weakness (generalized)   Localized edema   Pain in left hip   Stiffness of left hip, not elsewhere classified   Rationale for Evaluation and Treatment: Rehabilitation   ONSET DATE: May of this year after a trip to [place]   SUBJECTIVE:  SUBJECTIVE STATEMENT: Ezmay notes good HEP compliance over the past week.  She notes feeling stronger and she now has some periods of time without pain.  Pain can still be moderately severe at a 6/10 level.   Evaluation: After a trip to [facility] in March that required a lot of walking, she started a walking exercise program at home.  She very quickly started to have an increase in her left hip, gluteal and groin pain.   PERTINENT HISTORY: Left shoulder OA, chronic kidney disease, type 2 DM, HLD, right ankle fracture '91, morbid obesity   PAIN:  Are you having pain? Yes: NPRS scale: 0-6/10 Pain location: Left hip, groin and gluteals Pain description: Achy, sore, stiff Aggravating factors: Prolonged postures and weightbearing Relieving factors: Change of position   PRECAUTIONS: None   RED FLAGS: None      WEIGHT BEARING RESTRICTIONS: No   FALLS:  Has patient fallen in last 6 months? No   LIVING ENVIRONMENT: Lives with: lives alone Lives in: House/apartment Stairs:  Avoids stairs Has following equipment at home: Single point cane   OCCUPATION: Claims  specialist, works from home on a computer   PLOF: Independent   PATIENT GOALS: Return to walking normally without pain with better gait quality.  Be able to walk for exercise.   NEXT MD VISIT: NA   OBJECTIVE:    DIAGNOSTIC FINDINGS: 2 view radiographs of the left hip shows arthritic changes with  subchondral cyst sclerosis and joint space narrowing.   PATIENT SURVEYS:  02/07/2023 FOTO 51 (Goal 61, was 43)  Eval: FOTO 43, risk adjusted 55 (Goal 61 in 13 visits)   COGNITION: Overall cognitive status: Within functional limits for tasks assessed                         SENSATION: No complaints of tingling or numbness   MUSCLE LENGTH: 02/07/2023: Hamstrings: Right 45 deg; Left 40 deg  Eval Hamstrings: Right 35 deg; Left 30 deg   POSTURE: flexed trunk      LOWER EXTREMITY ROM:   Passive ROM Left/Right 01/04/2023 Left/Right 02/07/2023   Hip flexion 85/85  90/85  Hip extension      Hip abduction      Hip adduction      Hip internal rotation 5/4  10/8  Hip external rotation 30/30  30/33  Knee flexion      Knee extension      Ankle dorsiflexion      Ankle plantarflexion      Ankle inversion      Ankle eversion       (Blank rows = not tested)   LOWER EXTREMITY STRENGTH:   In pounds with hand-held dynamometer for quads and MMT for hip Left/Right 01/03/2023 265 BW Left/Right 01/24/2023 Left 02/07/2023  Hip flexion       Hip extension       Hip abduction 3/4  deferred   Hip adduction       Hip internal rotation       Hip external rotation       Knee flexion       Knee extension 20.5/74.7 32.6/deferred  55.4 pounds  Ankle dorsiflexion       Ankle plantarflexion       Ankle inversion       Ankle eversion        (Blank rows = not tested)     GAIT: Distance walked: 150 feet Assistive device utilized: Single point  cane Level of assistance: Complete Independence Comments: Dameria was not using an assistive device before this recent episode     TODAY'S TREATMENT:                                                                                                                               DATE:  02/07/2023 Trunk extension AROM 5 x 3 seconds in comfortable range (hips forward) -Quadriceps sets with pillow under bilateral knees (toes back, press knees down and tighten thighs) 10 x 5 seconds -Clams (supine) with black Thera-Band resistance 10 for 3 seconds Single knee to chest stretch 4 x 20 seconds bilateral Hamstrings stretch with strap 4 x 20 seconds bilateral Yoga bridge 10 x 5 seconds Left side only gluteal stretch (knee to opposite shoulder) 4 x 20 seconds Figure 4 stretch left side only 4 x 20 seconds -Hip hike at counter top 10 x 3 seconds   Functional Activities: Double Leg Press 112# 15 x slow eccentrics Single Leg Press 56# 10 x slow eccentrics Discussion on log roll, avoiding prolonged and flexed postures and review of MRI on Teya's phone   01/31/2023 Trunk extension AROM 10 x 3 seconds in comfortable range (hips forward) Quadriceps sets with pillow under bilateral knees (toes back, press knees down and tighten thighs) 10 x 5 seconds Clams (supine) with black Thera-Band resistance 10 for 3 seconds Single knee to chest stretch 4 x 20 seconds bilateral Hamstrings stretch with strap 4 x 20 seconds bilateral Yoga bridge 12 x 5 seconds Left side only gluteal stretch (knee to opposite shoulder) 4 x 20 seconds Figure 4 stretch left side only 4 x 20 seconds Hip hike at counter top 10 x 3 seconds   Functional Activities: Double Leg Press 112# 15 x slow eccentrics Single Leg Press 56# 10 x slow eccentrics Discussion on log roll, avoiding prolonged and flexed postures and spine anatomy with the model   01/24/2023 Quadriceps sets with pillow under bilateral knees (toes back, press knees down and tighten thighs) 10 x 5 seconds Clams (lie right, lift left) with black Thera-Band resistance 2 sets of 10 for 3 seconds Single knee to chest stretch 4 x 20 seconds  bilateral Hamstrings stretch with strap 4 x 20 seconds bilateral Yoga bridge 10 x 5 seconds Modified Thomas stretch 2 x 20 second Hip hike at counter top 10 x 3 seconds  Neuromuscular re-education: Tandem balance 4 x 20 seconds bilateral  Functional Activities: Double Leg Press 112# 12 x slow eccentrics Single Leg Press 56# 10 x slow eccentrics   PATIENT EDUCATION:  Education details: Reviewed exam findings and home exercise program Person educated: Patient Education method: Explanation, Demonstration, Tactile cues, Verbal cues, and Handouts Education comprehension: verbalized understanding, returned demonstration, verbal cues required, tactile cues required, and needs further education   HOME EXERCISE PROGRAM: [id number]   ASSESSMENT:   CLINICAL IMPRESSION: Emireth is making slow but significant objective and functonal progress towards long-term  goals.  She may be getting some radicular symptoms on the left side as distal as the knee in addition to her hip symptoms.  We have progressed core strengthening and she will benefit from continued work in this area.  Although better, strength is still impaired and Danniel will benefit from the recommended course of rehabilitation.    Evaluation: Jarethzy had an increase in the left gluteal, hip and groin pain after starting an exercise program after a trip to [place] that required a lot of walking.  She had some pain after the [place] trip and thought increasing her walking would help her more rapidly get over this.  Symptoms continued to worsen to the point that she had to move to an assistive device.  Objectively, she has stiff hips consistent with her daily routine of sitting at a desk most of the day at work.  She also has very weak left quadriceps and hip abductors.  Her left knee has also been giving her trouble and this is consistent with her quadriceps weakness on that side.  She will benefit from focusing on left-sided quadriceps and hip abductor  strengthening along with appropriate flexibility activities to address specific areas noted in the evaluation (hip flexors and hamstrings emphasis).   OBJECTIVE IMPAIRMENTS: Abnormal gait, decreased activity tolerance, decreased endurance, decreased mobility, difficulty walking, decreased ROM, decreased strength, increased edema, impaired perceived functional ability, impaired flexibility, postural dysfunction, obesity, and pain.    ACTIVITY LIMITATIONS: lifting, bending, sitting, standing, squatting, stairs, and locomotion level   PARTICIPATION LIMITATIONS: community activity and occupation   PERSONAL FACTORS: Left shoulder OA, chronic kidney disease, type 2 DM, HLD, right ankle fracture '91, morbid obesity are also affecting patient's functional outcome.    REHAB POTENTIAL: Good   CLINICAL DECISION MAKING: Stable/uncomplicated   EVALUATION COMPLEXITY: Low     GOALS: Goals reviewed with patient? Yes   SHORT TERM GOALS: Target date: 01/31/2023 Shaneque will be independent with her day 1 HEP Baseline: Started 01/04/2023 Goal status: Met 01/15/2023   2.  Improve bilateral lower extremity flexibility for hip flexors to 95 degrees, hamstrings to 45 degrees and hip external rotation to at least 35 degrees Baseline: 85 degrees, 30 to 35 degrees and 30 degrees, respectively Goal status: Partially Met 02/07/2023   3.  Improve left quadriceps strength to at least 35 pounds Baseline: 20.5 pounds Goal status: Met 02/07/2023     LONG TERM GOALS: Target date: 03/21/2023   Improve FOTO to 61 in 13 visits Baseline: 43, risk adjusted 55 Goal status: On Going 02/07/2023   2.  Maurina will report left hip pain consistently 0-2/10 on the Numeric Pain Rating Scale Baseline: 3-5/10 Goal status: On Going 02/07/2023   3.  Improve hip AROM for external rotation to 40 degrees Baseline: 30 degrees Goal status: On Going 02/07/2023   4.  Improve left hip strength for hip abductors to at least 4/5 MMT Baseline:  3/5 MMT Goal status: Ion Going 02/07/2023   5.  Improve left quadriceps strength to at least 50 pounds Baseline: 20.5 pounds Goal status: Met 02/07/2023   6.  Samiyyah will be independent with her long-term maintenance HEP at [place] Baseline: Started 01/04/2023 Goal status: On Going 02/07/2023     PLAN:   PT FREQUENCY: 1x/week   PT DURATION: 6 additional weeks   PLANNED INTERVENTIONS: Therapeutic exercises, Therapeutic activity, Neuromuscular re-education, Balance training, Gait training, Patient/Family education, Self Care, Stair training, Cryotherapy, Vasopneumatic device, and Manual therapy   PLAN FOR NEXT  SESSION: Keep her home exercises simple focused on quadriceps, low back and hip abductors strengthening.  Appropriate strength progressions focused on the above to listed areas along with select flexibility exercises.  The focus of her home program should remain on strengthening.  Strength progressions next visit.     Cherlyn Cushing, PT, MPT 01/04/2023, 4:23 PM

## 2023-02-08 ENCOUNTER — Ambulatory Visit: Payer: 59 | Admitting: Internal Medicine

## 2023-02-14 ENCOUNTER — Encounter: Payer: Self-pay | Admitting: Rehabilitative and Restorative Service Providers"

## 2023-02-14 ENCOUNTER — Ambulatory Visit (INDEPENDENT_AMBULATORY_CARE_PROVIDER_SITE_OTHER): Payer: 59 | Admitting: Rehabilitative and Restorative Service Providers"

## 2023-02-14 DIAGNOSIS — R600 Localized edema: Secondary | ICD-10-CM

## 2023-02-14 DIAGNOSIS — M25552 Pain in left hip: Secondary | ICD-10-CM

## 2023-02-14 DIAGNOSIS — R262 Difficulty in walking, not elsewhere classified: Secondary | ICD-10-CM

## 2023-02-14 DIAGNOSIS — M6281 Muscle weakness (generalized): Secondary | ICD-10-CM

## 2023-02-14 DIAGNOSIS — M25652 Stiffness of left hip, not elsewhere classified: Secondary | ICD-10-CM
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
## 2023-02-20 ENCOUNTER — Encounter: Payer: Self-pay | Admitting: Family

## 2023-02-20 ENCOUNTER — Ambulatory Visit (INDEPENDENT_AMBULATORY_CARE_PROVIDER_SITE_OTHER): Payer: 59 | Admitting: Family

## 2023-02-20 VITALS — BP 126/62 | HR 101 | Temp 98.7°F | Resp 16 | Ht 65.0 in | Wt 259.0 lb

## 2023-02-20 DIAGNOSIS — I10 Essential (primary) hypertension: Secondary | ICD-10-CM

## 2023-02-20 DIAGNOSIS — F32A Depression, unspecified: Secondary | ICD-10-CM | POA: Diagnosis not present

## 2023-02-20 DIAGNOSIS — F902 Attention-deficit hyperactivity disorder, combined type: Secondary | ICD-10-CM

## 2023-02-20 DIAGNOSIS — Z0000 Encounter for general adult medical examination without abnormal findings: Secondary | ICD-10-CM

## 2023-02-20 DIAGNOSIS — Z0001 Encounter for general adult medical examination with abnormal findings: Secondary | ICD-10-CM | POA: Diagnosis not present

## 2023-02-20 DIAGNOSIS — E1165 Type 2 diabetes mellitus with hyperglycemia: Secondary | ICD-10-CM | POA: Diagnosis not present

## 2023-02-20 DIAGNOSIS — G4700 Insomnia, unspecified: Secondary | ICD-10-CM

## 2023-02-20 DIAGNOSIS — Z1231 Encounter for screening mammogram for malignant neoplasm of breast: Secondary | ICD-10-CM

## 2023-02-20 DIAGNOSIS — E785 Hyperlipidemia, unspecified: Secondary | ICD-10-CM | POA: Diagnosis not present

## 2023-02-20 LAB — COMPREHENSIVE METABOLIC PANEL
ALT: 13 U/L (ref 0–35)
AST: 11 U/L (ref 0–37)
Albumin: 4 g/dL (ref 3.5–5.2)
Alkaline Phosphatase: 76 U/L (ref 39–117)
BUN: 13 mg/dL (ref 6–23)
CO2: 26 mEq/L (ref 19–32)
Calcium: 9.7 mg/dL (ref 8.4–10.5)
Chloride: 101 mEq/L (ref 96–112)
Creatinine, Ser: 0.91 mg/dL (ref 0.40–1.20)
GFR: 73.17 mL/min (ref >60.00)
Glucose, Bld: 153 mg/dL — ABNORMAL HIGH (ref 70–99)
Potassium: 4.6 mEq/L (ref 3.5–5.1)
Sodium: 137 mEq/L (ref 135–145)
Total Bilirubin: 0.6 mg/dL (ref 0.2–1.2)
Total Protein: 6.7 g/dL (ref 6.0–8.3)

## 2023-02-20 LAB — HEMOGLOBIN A1C: Hgb A1c MFr Bld: 7.2 % — ABNORMAL HIGH (ref 4.6–6.5)

## 2023-02-20 NOTE — Patient Instructions (Signed)
VISIT SUMMARY:  During your recent visit, we discussed your ongoing issues with ADHD, joint pain, and weight management. You are doing well with your ADHD medication and your joint pain is being managed with gabapentin and physical therapy. We also discussed your struggles with weight loss due to financial constraints and physical limitations.  YOUR PLAN:  -CHRONIC PAIN: You have severe pain in your back and joints, which affects your mobility and sleep. We will continue your current treatment with gabapentin and physical therapy. We may also consider consulting with a pain management specialist.  -ADHD: Your ADHD is well controlled with your current medication, Adderall. We will continue this regimen and update your medication contract.  -WEIGHT MANAGEMENT: You are having difficulty losing weight due to financial constraints and physical limitations. We may discuss with an endocrinologist about switching your medication to [facility] for better weight loss results.  -GENERAL HEALTH MAINTENANCE: You are up to date on your vision exam and have a dental exam scheduled for January. You had a mammogram done in July and we will pull the records. Continue your attempts at a healthy diet and consider low impact exercises such as chair exercises, recumbent bike, or water aerobics.  INSTRUCTIONS:  Please continue with your current treatments and medications. We will follow up in six months to assess your progress and make any necessary adjustments. In the meantime, if you have any concerns or if your symptoms worsen, please contact the office.

## 2023-02-20 NOTE — Assessment & Plan Note (Signed)
  Up to date on screenings. Mammogram completed in July. Dental exam scheduled for January. Vision exam up to date. -Continue current health maintenance regimen. -Check mammogram results from July. -immunizations up to date -discussed healthy diet, exercise and weight loss.

## 2023-02-20 NOTE — Assessment & Plan Note (Addendum)
Stable on prozac 30mg . Continue same.                                                                                                                                                                                                             Stable on prozac 30mg  once daily. She is having a lot of financial stressors but seems to be handling well.

## 2023-02-20 NOTE — Assessment & Plan Note (Signed)
Stable on adderall continues same, update contract today and UDS.

## 2023-02-20 NOTE — Assessment & Plan Note (Signed)
Offered trial of hydroxyzine, she declines for now.

## 2023-02-20 NOTE — Assessment & Plan Note (Signed)
Lab Results  Component Value Date   CHOL 152 07/16/2022   HDL 49.20 07/16/2022   LDLCALC 81 07/16/2022   TRIG 109.0 07/16/2022   CHOLHDL 3 07/16/2022   LDL at goal, continue crestor 10mg .

## 2023-02-20 NOTE — Assessment & Plan Note (Signed)
BP at goal, Continue aldactone and amlodipine.

## 2023-02-20 NOTE — Assessment & Plan Note (Signed)
Lab Results  Component Value Date   HGBA1C 6.3 (A) 09/28/2021   HGBA1C 7.0 (A) 05/31/2021   HGBA1C 7.0 (A) 10/14/2020   Lab Results  Component Value Date   MICROALBUR 2.9 (H) 07/16/2022   LDLCALC 81 07/16/2022   CREATININE 1.03 (H) 11/29/2020   Followed by endocrinology. Will update A1C.

## 2023-02-20 NOTE — Progress Notes (Signed)
Subjective:     Patient ID: Audrey Goodman, female    DOB: 03-Nov-1970, 52 y.o.   MRN: [id number]  Chief Complaint  Patient presents with   Annual Exam    HPI  Discussed the use of AI scribe software for clinical note transcription with the patient, who gave verbal consent to proceed.  History of Present Illness   The patient, with a history of ADHD, depression, and weight issues, presents for a routine physical and medication follow-up. She reports struggling with maintaining a healthy diet and exercise due to financial constraints and physical limitations, including joint pain. She is currently undergoing physical therapy once a week.  The patient's ADHD is well-managed with Adderall, taking 30 mg extended in the morning and half of a 10 mg as needed in the afternoon. She reports that her focus is maintained and only occasionally require the additional dose in the afternoon.  Her depression is managed with Prozac, taking 20 mg plus an additional 10 mg daily. The patient reports that the medication is essential for her wellbeing.  The patient is also on [facility] for weight loss but has not noticed significant changes. She mentions that despite taking Wegovy, she still experiences hunger.  The patient also reports joint pain, which is severe enough to limit her mobility and disrupt her sleep. She is currently taking gabapentin for nerve pain, but it only provides minimal relief.  The patient also mentions having an implant for birth control and experiences occasional breakthrough bleeding. She also reports occasional constipation, which she manages with fiber and Miralax.      Patient presents today for complete physical.  Immunizations: had covid booster and flu shot, shingrix complete and Tdap up to date Diet:  Wt Readings from Last 3 Encounters:  02/20/23 259 lb (117.5 kg)  07/16/22 255 lb (115.7 kg)  06/15/22 265 lb (120.2 kg)  Exercise:  not much Colonoscopy: 12/02/20 Pap  Smear: 12/01/20 Mammogram: 7/24 per gyn, will request Vision: up to date Dental: due since June, schedule    Health Maintenance Due  Topic Date Due   FOOT EXAM  09/29/2022   MAMMOGRAM  12/06/2022   HEMOGLOBIN A1C  12/07/2022    Past Medical History:  Diagnosis Date   Anxiety    Arthritis    left shoulder   Back pain    Chronic kidney disease    stage 1   Depression    Diabetes mellitus without complication (HCC)    type 2- on meds   Diverticulitis    High cholesterol    History of chicken pox    Hyperlipidemia    on meds   Hypertension    on meds   Joint pain    Lactose intolerance    PCOS (polycystic ovarian syndrome)    Seasonal allergies    Shingles    Sleep apnea    does NOT use CPAP   SVD (spontaneous vaginal delivery) 1995   x 1   Type 2 diabetes mellitus, uncontrolled 07/30/2006   Qualifier: Diagnosis of  By: Lanier Prude  MD, Cathrine Muster      Past Surgical History:  Procedure Laterality Date   ANKLE FRACTURE SURGERY  05/28/1989   right   CHOLECYSTECTOMY  05/28/2006   COLONOSCOPY  1989   due to blood in stool and diverticulitis per pt   HYSTEROSCOPY N/A 09/13/2014   Procedure: HYSTEROSCOPY with IUD removal;  Surgeon: Mitchel Honour, DO;  Location: [facility];  Service: Gynecology;  Laterality: N/A;  need longest speculum and instruments that aresavailable due to patient's habitus (BMI ~60)    Family History  Problem Relation Age of Onset   Obesity Mother    Colon polyps Mother    Cancer Mother        breast   Hyperlipidemia Mother    Heart disease Mother    Hypertension Mother    Mental illness Mother        depression   Diabetes Mother    Alcoholism Father    Hyperlipidemia Father    Stroke Father    Hypertension Father    Cancer Maternal Grandmother        colon   Diabetes Maternal Grandmother    Diabetes Maternal Aunt    Glaucoma Maternal Aunt    Colon polyps Maternal Uncle    Colon cancer Maternal Uncle    Diabetes Maternal Uncle    Diabetes  Maternal Uncle    Esophageal cancer Neg Hx    Rectal cancer Neg Hx    Stomach cancer Neg Hx     Social History   Socioeconomic History   Marital status: Single    Spouse name: Not on file   Number of children: Not on file   Years of education: Not on file   Highest education level: Not on file  Occupational History   Not on file  Tobacco Use   Smoking status: Never   Smokeless tobacco: Never  Vaping Use   Vaping status: Never Used  Substance and Sexual Activity   Alcohol use: No    Alcohol/week: 0.0 standard drinks of alcohol   Drug use: No   Sexual activity: Yes    Partners: Male    Birth control/protection: Pill  Other Topics Concern   Not on file  Social History Narrative   Single- lives alone   Son 21- senior at [employer]   Enjoys reading   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 30 capsule 0   amphetamine-dextroamphetamine (ADDERALL) 10 MG tablet Take 0.5 tablets (5 mg total) by mouth daily as needed. 15 tablet 0   cetirizine (ZYRTEC) 10 MG tablet Take 10 mg by mouth daily.     Continuous Blood Gluc Sensor (FREESTYLE LIBRE 3 SENSOR) MISC 1 each by Does not apply route every 14 (fourteen) days. 6 each 3   FARXIGA 10 MG TABS tablet TAKE 1 TABLET BY MOUTH DAILY BEFORE BREAKFAST AS DIRECTED 90 tablet 0   FLUoxetine (PROZAC) 10 MG capsule Take 1 capsule (10 mg total) by mouth daily. Should be given with prozac 20mg  caps for total of 30  mg/day. 90 capsule 3   FLUoxetine (PROZAC) 20 MG capsule Take 1 capsule (20 mg total) by mouth daily. 90 capsule 0   insulin aspart (NOVOLOG FLEXPEN) 100 UNIT/ML FlexPen INJECT 12-15 UNITS (DEPENDING ON MEAL SIZE) UNDER THE SKIN 3 TIMES A DAY 30 mL 0   insulin glargine (  LANTUS SOLOSTAR) 100 UNIT/ML Solostar Pen Inject 30 Units into the skin daily. 30 mL 3   Insulin Pen Needle 32G X 4 MM MISC Use to inject insulin 4 times daily 400 each 3   metFORMIN (GLUCOPHAGE-XR) 750 MG 24 hr tablet TAKE 1 TABLET BY MOUTH 2 TIMES DAILY. 180 tablet 3   Multiple Vitamin (MULTIVITAMIN PO) Take 1 tablet by mouth daily at 6 (six) AM.     rosuvastatin (CRESTOR) 10 MG tablet TAKE 1 TABLET BY MOUTH EVERY DAY 90 tablet 1   Semaglutide-Weight Management (WEGOVY) 2.4 MG/0.75ML SOAJ Inject 2.4 mg into the skin once a week. 3 mL 2   spironolactone (ALDACTONE) 50 MG tablet TAKE 1 TABLET BY MOUTH TWICE A DAY 180 tablet 3   Semaglutide, 2 MG/DOSE, (OZEMPIC, 2 MG/DOSE,) 8 MG/3ML SOPN Inject 2 mg into the skin once a week. 9 mL 3   No facility-administered medications prior to visit.    Allergies  Allergen Reactions   Lisinopril Swelling    Swelling of the left side of tongue   Lipitor [Atorvastatin] Other (See Comments)    Myalgia/foot cramping    Review of Systems  Constitutional:  Negative for weight loss.  HENT:  Negative for congestion and hearing loss.   Eyes:  Negative for blurred vision.  Respiratory:  Negative for cough.   Gastrointestinal:  Positive for constipation (occasional constipation, increases fiber, prn miralax and increased water). Negative for diarrhea.  Genitourinary:  Negative for dysuria and frequency.  Musculoskeletal:  Positive for back pain and joint pain. Negative for myalgias.  Skin:  Negative for rash.  Neurological:  Negative for headaches.  Psychiatric/Behavioral:         Stable mood on prozac       Objective:    Physical Exam   BP 126/62 (BP Location: Right Arm, Patient  Position: Sitting, Cuff Size: Large)   Pulse (!) 101   Temp 98.7 F (37.1 C) (Oral)   Resp 16   Ht 5\' 5"  (1.651 m)   Wt 259 lb (117.5 kg)   SpO2 100%   BMI 43.10 kg/m  Wt Readings from Last 3 Encounters:  02/20/23 259 lb (117.5 kg)  07/16/22 255 lb (115.7 kg)  06/15/22 265 lb (120.2 kg)  Physical Exam  Constitutional: She is oriented to person, place, and time. She appears well-developed and well-nourished. No distress.  HENT:  Head: Normocephalic and atraumatic.  Right Ear: Tympanic membrane and ear canal normal.  Left Ear: Tympanic membrane and ear canal normal.  Mouth/Throat: Oropharynx is clear and moist.  Eyes: Pupils are equal, round, and reactive to light. No scleral icterus.  Neck: Normal range of motion. No thyromegaly present.  Cardiovascular: Normal rate and regular rhythm.   No murmur heard. Pulmonary/Chest: Effort normal and breath sounds normal. No respiratory distress. He has no wheezes. She has no rales. She exhibits no tenderness.  Abdominal: Soft. Bowel sounds are normal. She exhibits no distension and no mass. There is no tenderness. There is no rebound and no guarding.  Musculoskeletal: She exhibits no edema.  Lymphadenopathy:    She has no cervical adenopathy.  Neurological: She is alert and oriented to person, place, and time. She has 2+ R patellar reflex and 0+ L patellar reflex. She exhibits normal muscle tone. Coordination normal.  Skin: Skin is warm and dry.  Psychiatric: She has a normal mood and affect. Her behavior is normal. Judgment and thought content normal  Assessment & Plan:        Assessment & Plan:   Problem List Items Addressed This Visit       Unprioritized   Uncontrolled diabetes mellitus with hyperglycemia (HCC)    Lab Results  Component Value Date   HGBA1C 6.3 (A) 09/28/2021   HGBA1C 7.0 (A) 05/31/2021   HGBA1C 7.0 (A) 10/14/2020   Lab Results  Component Value Date   MICROALBUR 2.9 (H) 07/16/2022   LDLCALC  81 07/16/2022   CREATININE 1.03 (H) 11/29/2020   Followed by endocrinology. Will update A1C.       Relevant Orders   HgB A1c   Comp Met (CMET)   Preventative health care     Up to date on screenings. Mammogram completed in July. Dental exam scheduled for January. Vision exam up to date. -Continue current health maintenance regimen. -Check mammogram results from July. -immunizations up to date -discussed healthy diet, exercise and weight loss.       Insomnia    Offered trial of hydroxyzine, she declines for now.       HYPERTENSION, BENIGN ESSENTIAL    BP at goal, Continue aldactone and amlodipine.       Hyperlipidemia    Lab Results  Component Value Date   CHOL 152 07/16/2022   HDL 49.20 07/16/2022   LDLCALC 81 07/16/2022   TRIG 109.0 07/16/2022   CHOLHDL 3 07/16/2022   LDL at goal, continue crestor 10mg .        Depression    Stable on prozac 30mg . Continue same.                                                                                                                                                                                                             Stable on prozac 30mg  once daily. She is having a lot of financial stressors but seems to be handling well.               Attention deficit hyperactivity disorder, combined type, moderate    Stable on adderall continues same, update contract today and UDS.      Relevant Orders   DRUG MONITORING, PANEL 8 WITH CONFIRMATION, URINE   Other Visit Diagnoses     Breast cancer screening by mammogram    -  Primary   Relevant Orders   MM 3D SCREENING MAMMOGRAM BILATERAL BREAST       I have discontinued Lacara C. Monjaras's Ozempic (2 MG/DOSE). I am also having her maintain her Multiple Vitamin  (MULTIVITAMIN PO), cetirizine,  FreeStyle Libre 3 Sensor, spironolactone, Insulin Pen Needle, Lantus SoloStar, rosuvastatin, FLUoxetine, Wegovy, metFORMIN, FLUoxetine, NovoLOG FlexPen, amLODipine, amphetamine-dextroamphetamine, amphetamine-dextroamphetamine, and [place].  No orders of the defined types were placed in this encounter.

## 2023-02-22 ENCOUNTER — Ambulatory Visit: Payer: 59 | Admitting: Sports Medicine

## 2023-02-22 ENCOUNTER — Ambulatory Visit (INDEPENDENT_AMBULATORY_CARE_PROVIDER_SITE_OTHER): Payer: 59 | Admitting: Physical Therapy

## 2023-02-22 ENCOUNTER — Encounter: Payer: Self-pay | Admitting: Physical Therapy

## 2023-02-22 ENCOUNTER — Encounter: Payer: Self-pay | Admitting: Sports Medicine

## 2023-02-22 DIAGNOSIS — M25652 Stiffness of left hip, not elsewhere classified: Secondary | ICD-10-CM

## 2023-02-22 DIAGNOSIS — M6281 Muscle weakness (generalized): Secondary | ICD-10-CM

## 2023-02-22 DIAGNOSIS — R600 Localized edema: Secondary | ICD-10-CM

## 2023-02-22 DIAGNOSIS — M1612 Unilateral primary osteoarthritis, left hip: Secondary | ICD-10-CM | POA: Diagnosis not present

## 2023-02-22 DIAGNOSIS — M48061 Spinal stenosis, lumbar region without neurogenic claudication: Secondary | ICD-10-CM | POA: Diagnosis not present

## 2023-02-22 DIAGNOSIS — M25552 Pain in left hip: Secondary | ICD-10-CM

## 2023-02-22 DIAGNOSIS — M5442 Lumbago with sciatica, left side: Secondary | ICD-10-CM | POA: Diagnosis not present

## 2023-02-22 DIAGNOSIS — R262 Difficulty in walking, not elsewhere classified: Secondary | ICD-10-CM | POA: Diagnosis not present

## 2023-02-22 DIAGNOSIS — G8929 Other chronic pain: Secondary | ICD-10-CM

## 2023-02-22 NOTE — Progress Notes (Signed)
Patient states that she has been having low back pain and left leg weakness since going on a trip to [place] and increased activity in the time since then. She states that today between the rain and physical therapy her pain has increased, which is normal for her after physical therapy. She states that she can lay on her left and right sides, and her stomach, but cannot lay on her back. Patient says that she takes Gabapentin and has increased her dose of Prozac as her depression and anxiety are being impacted by her back pain.
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
## 2023-02-22 NOTE — Progress Notes (Signed)
Audrey Goodman - 52 y.o. female MRN [id number]  Date of birth: April 18, 1971  Office Visit Note: Visit Date: 02/22/2023 PCP: Audrey Craze, NP Referred by: Audrey Craze, NP  Subjective: Chief Complaint  Patient presents with   Lower Back - Pain   HPI: Audrey Goodman is a pleasant 52 y.o. female who presents today for chronic low back pain with left-sided sciatica and left lower extremity weakness.  She has had chronic pain with left-sided sciatica.  This all started back in May after she took a long trip to [place].  More bothersome is the shooting pain down the left leg as well as reported left leg weakness.  She is undergoing formalized physical therapy and is making improvements in her strength for the left leg.  She manages her pain with gabapentin anywhere from 300 to 900 mg 3 times daily.  She does note that her back and leg pain is becoming bothersome that is affecting her quality of life as well as her depression and anxiety, she recently had increased his Prozac dose.  She has tried oral prednisone in the past without much relief.  Aggravating factors: Going from sitting to standing, laying down at night, extension  She did see Audrey Goodman - and at that point he did not see any significant structural abnormality to proceed with any surgical correction, especially given her BMI per his notes.  She did have an ultrasound-guided intra-articular left hip injection back in July, this definitely helped reduce her left hip and leg pain but did not relieve it.  Pertinent ROS were reviewed with the patient and found to be negative unless otherwise specified above in HPI.   Assessment & Plan: Visit Diagnoses:  1. Chronic left-sided low back pain with left-sided sciatica   2. Foraminal stenosis of lumbar region   3. Unilateral primary osteoarthritis, left hip    Plan: Discussed with Audrey Goodman the nature of her chronic low back pain with left-sided radiculopathy.  She does have a  moderate disc bulge at the L3-L4 level that does protrude up against the right foraminal region.  She does have moderate to severe left foraminal stenosis however.  Her exam and radicular symptoms are indicative of the left L3 and possibly L4 dermatome.  Given this and the chronicity of her pain, I think the next best step would be sending her to my partner, Audrey Goodman, for L3-L4 interlaminar injection.  I did discuss with her the event both her disc protrusion as well as her foraminal stenosis, could consider an additional injection based on the recommendation of Audrey Goodman, we will leave this up to him.  I will see her back after she has said injection(s) to see what degree of relief she has.  She does have left hip osteoarthritis as well that may be contributing.  She will continue her gabapentin 300 to 900 mg 3 times daily.   Follow-up: Return for f/u with Audrey Goodman - will see in follow-up .   Meds & Orders: No orders of the defined types were placed in this encounter.   Orders Placed This Encounter  Procedures   Ambulatory referral to Physical Medicine Rehab   Ambulatory referral to Physical Therapy     Procedures: No procedures performed      Clinical History: No specialty comments available.  She reports that she has never smoked. She has never used smokeless tobacco.  Recent Labs    02/20/23 1014  HGBA1C 7.2*    Objective:  Physical Exam  Gen: Well-appearing, in no acute distress; non-toxic CV:  Well-perfused. Warm.  Resp: Breathing unlabored on room air; no wheezing. Psych: Fluid speech in conversation; appropriate affect; normal thought process Neuro: Sensation intact throughout. No gross coordination deficits.   Ortho Exam - Lumbar: There is no midline spinous process TTP.  There is some left-sided lumbar paraspinal hypertonicity.  Positive modified slump's test on the left, negative straight leg raise.  There is pain and some limitation with extension, full range of  motion with flexion and relieving of her pain.  There is 2+ Achilles DTRs bilaterally, trace DTR of the left patella, 1+ on the right.  - Left hip:   Imaging:  -Independent review and interpretation of MRI lumbar spine from 10/28/2022 was performed by myself today.  There is multilevel mild to moderate degenerative disc disease.  There is an moderately prominent disc bulge at the L3-L4 level with right foraminal protrusion.  There is at least moderate to severe left and right foraminal stenosis.  Mild disc bulge at the L4-L5 level without neural impingement.  There is some facet arthropathy of the mid to lower lumbar spine.  - 11/20/22: 2 view radiographs of the left hip shows arthritic changes with  subchondral cyst sclerosis and joint space narrowing.   Past Medical/Family/Surgical/Social History: Medications & Allergies reviewed per EMR, new medications updated. Patient Active Problem List   Diagnosis Date Noted   Insomnia 02/20/2023   Attention deficit hyperactivity disorder, combined type, moderate 03/01/2022   Impacted cerumen of both ears 01/23/2021   Left Achilles tendinitis 06/19/2017   Depression 07/13/2015   Preventative health care 06/23/2014   PCOS (polycystic ovarian syndrome) 09/10/2013   CHOLELITHIASIS 02/20/2008   Hyperlipidemia 08/06/2006   Uncontrolled diabetes mellitus with hyperglycemia (HCC) 07/30/2006   HYPERTENSION, BENIGN ESSENTIAL 07/30/2006   MORBID OBESITY 07/25/2006   Past Medical History:  Diagnosis Date   Anxiety    Arthritis    left shoulder   Back pain    Chronic kidney disease    stage 1   Depression    Diabetes mellitus without complication (HCC)    type 2- on meds   Diverticulitis    High cholesterol    History of chicken pox    Hyperlipidemia    on meds   Hypertension    on meds   Joint pain    Lactose intolerance    PCOS (polycystic ovarian syndrome)    Seasonal allergies    Shingles    Sleep apnea    does NOT use CPAP   SVD  (spontaneous vaginal delivery) 1995   x 1   Type 2 diabetes mellitus, uncontrolled 07/30/2006   Qualifier: Diagnosis of  By: Audrey Prude  MD, Cathrine Muster     Family History  Problem Relation Age of Onset   Obesity Mother    Colon polyps Mother    Cancer Mother        breast   Hyperlipidemia Mother    Heart disease Mother    Hypertension Mother    Mental illness Mother        depression   Diabetes Mother    Alcoholism Father    Hyperlipidemia Father    Stroke Father    Hypertension Father    Cancer Maternal Grandmother        colon   Diabetes Maternal Grandmother    Diabetes Maternal Aunt    Glaucoma Maternal Aunt    Colon polyps Maternal Uncle    Colon cancer  Maternal Uncle    Diabetes Maternal Uncle    Diabetes Maternal Uncle    Esophageal cancer Neg Hx    Rectal cancer Neg Hx    Stomach cancer Neg Hx    Past Surgical History:  Procedure Laterality Date   ANKLE FRACTURE SURGERY  05/28/1989   right   CHOLECYSTECTOMY  05/28/2006   COLONOSCOPY  1989   due to blood in stool and diverticulitis per pt   HYSTEROSCOPY N/A 09/13/2014   Procedure: HYSTEROSCOPY with IUD removal;  Surgeon: Mitchel Honour, DO;  Location: [facility];  Service: Gynecology;  Laterality: N/A;  need longest speculum and instruments that aresavailable due to patient's habitus (BMI ~60)   Social History   Occupational History   Not on file  Tobacco Use   Smoking status: Never   Smokeless tobacco: Never  Vaping Use   Vaping status: Never Used  Substance and Sexual Activity   Alcohol use: No    Alcohol/week: 0.0 standard drinks of alcohol   Drug use: No   Sexual activity: Yes    Partners: Male    Birth control/protection: Pill

## 2023-02-23 LAB — DRUG MONITORING, PANEL 8 WITH CONFIRMATION, URINE
6 Acetylmorphine: NEGATIVE ng/mL (ref <10)
Alcohol Metabolites: NEGATIVE ng/mL (ref <500)
Amphetamine: 5895 ng/mL — ABNORMAL HIGH (ref <250)
Amphetamines: POSITIVE ng/mL — AB (ref <500)
Benzodiazepines: NEGATIVE ng/mL (ref <100)
Buprenorphine, Urine: NEGATIVE ng/mL (ref <5)
Cocaine Metabolite: NEGATIVE ng/mL (ref <150)
Creatinine: 74.9 mg/dL (ref >=20.0)
MDMA: NEGATIVE ng/mL (ref <500)
Marijuana Metabolite: NEGATIVE ng/mL (ref <20)
Methamphetamine: NEGATIVE ng/mL (ref <250)
Opiates: NEGATIVE ng/mL (ref <100)
Oxidant: NEGATIVE ug/mL (ref <200)
Oxycodone: NEGATIVE ng/mL (ref <100)
pH: 5.4 (ref 4.5–9.0)

## 2023-02-23 LAB — DM TEMPLATE

## 2023-02-24 ENCOUNTER — Other Ambulatory Visit: Payer: Self-pay | Admitting: Family

## 2023-02-24 DIAGNOSIS — F902 Attention-deficit hyperactivity disorder, combined type: Secondary | ICD-10-CM

## 2023-02-25 MED ORDER — AMPHETAMINE-DEXTROAMPHETAMINE 10 MG PO TABS: 5.0000 mg | ORAL_TABLET | Freq: Every day | ORAL | 0 refills | Status: DC | PRN

## 2023-02-25 MED ORDER — AMPHETAMINE-DEXTROAMPHET ER 30 MG PO CP24: 30.0000 mg | ORAL_CAPSULE | ORAL | 0 refills | Status: DC

## 2023-02-25 NOTE — Telephone Encounter (Signed)
I had her update her contract on 9/25 as well as UDS. I don't think contract has been scanned into her record.

## 2023-02-25 NOTE — Telephone Encounter (Signed)
Requesting: Adderall  Contract: 03/16/2022 UDS: 03/16/2022 Last Visit: 02/20/2023 Next Visit: 08/20/2022 Last Refill: 01/23/2023 #30 with no refills   Please Advise

## 2023-02-28 ENCOUNTER — Telehealth: Payer: Self-pay | Admitting: Physical Medicine and Rehabilitation

## 2023-02-28 DIAGNOSIS — F411 Generalized anxiety disorder: Secondary | ICD-10-CM

## 2023-02-28 MED ORDER — DIAZEPAM 5 MG PO TABS: ORAL_TABLET | ORAL | 0 refills | Status: DC

## 2023-02-28 NOTE — Addendum Note (Signed)
Addended by: Ashok Norris on: 02/28/2023 10:44 AM   Modules accepted: Orders

## 2023-02-28 NOTE — Telephone Encounter (Addendum)
Spoke with patient and scheduled injection for 03/07/23. Patient aware driver needed  Patient scheduled for injection on 03/07/23. Needs pre procedure Valium sent to [facility]

## 2023-02-28 NOTE — Telephone Encounter (Signed)
Patient called returning your call.NW#[phone]

## 2023-03-01 ENCOUNTER — Encounter: Payer: 59 | Admitting: Rehabilitative and Restorative Service Providers"

## 2023-03-04 ENCOUNTER — Other Ambulatory Visit: Payer: Self-pay | Admitting: Family

## 2023-03-07 ENCOUNTER — Ambulatory Visit: Payer: 59 | Admitting: Physical Medicine and Rehabilitation

## 2023-03-07 ENCOUNTER — Other Ambulatory Visit: Payer: Self-pay

## 2023-03-07 VITALS — BP 128/82 | HR 98

## 2023-03-07 DIAGNOSIS — M5416 Radiculopathy, lumbar region: Secondary | ICD-10-CM

## 2023-03-07 MED ORDER — METHYLPREDNISOLONE ACETATE 40 MG/ML IJ SUSP
40.0000 mg | Freq: Once | INTRAMUSCULAR | Status: AC
Start: 2023-03-07 — End: 2023-03-07
  Administered 2023-03-07: 40 mg

## 2023-03-07 NOTE — Progress Notes (Signed)
Functional Pain Scale - descriptive words and definitions  Distracting (5)    Aware of pain/able to complete some ADL's but limited by pain/sleep is affected and active distractions are only slightly useful. Moderate range order  Average Pain 5   +Driver, -BT, -Dye Allergies.  Lower back pain on left side that radiates into the left leg

## 2023-03-07 NOTE — Patient Instructions (Signed)

## 2023-03-10 ENCOUNTER — Other Ambulatory Visit: Payer: Self-pay | Admitting: Internal Medicine

## 2023-03-10 DIAGNOSIS — E1165 Type 2 diabetes mellitus with hyperglycemia: Secondary | ICD-10-CM

## 2023-03-12 ENCOUNTER — Ambulatory Visit: Payer: 59 | Admitting: Internal Medicine

## 2023-03-15 ENCOUNTER — Ambulatory Visit: Payer: 59 | Admitting: Internal Medicine

## 2023-03-18 ENCOUNTER — Encounter: Payer: Self-pay | Admitting: Internal Medicine

## 2023-03-18 ENCOUNTER — Ambulatory Visit: Payer: 59 | Admitting: Internal Medicine

## 2023-03-18 VITALS — BP 120/70 | HR 94 | Ht 65.0 in | Wt 254.6 lb

## 2023-03-18 DIAGNOSIS — E1165 Type 2 diabetes mellitus with hyperglycemia: Secondary | ICD-10-CM | POA: Diagnosis not present

## 2023-03-18 DIAGNOSIS — Z7984 Long term (current) use of oral hypoglycemic drugs: Secondary | ICD-10-CM

## 2023-03-18 DIAGNOSIS — Z794 Long term (current) use of insulin: Secondary | ICD-10-CM | POA: Diagnosis not present

## 2023-03-18 DIAGNOSIS — E785 Hyperlipidemia, unspecified: Secondary | ICD-10-CM

## 2023-03-18 DIAGNOSIS — E282 Polycystic ovarian syndrome: Secondary | ICD-10-CM

## 2023-03-18 NOTE — Progress Notes (Signed)
Patient ID: Audrey Goodman, female   DOB: Apr 29, 1971, 52 y.o.   MRN: [id number]   HPI: Audrey Goodman is a 52 y.o.-year-old female, returning for f/u for DM2, dx with GDM in 1995, then DM 6 mo after son was born, insulin-dependent since ~2010, controlled, without long-term complications and PCOS. Last visit 5 months ago.  Interim history: She was previously seen with weight management clinic at [facility] - nonsurgical weight loss program.  She lost 20 pounds on their diet but could not be continue due to price.  Before last visit, she lost approximately 5 pounds. She lost 5 more since then. No increased urination, blurry vision, nausea, chest pain.  Since last visit, she had steroid p.o. and injections for sciatica and also in hip. She had dietary indiscretions while on Prednisone - icecream from [place] and Dorene Sorrow 2x a day, Lays potato chips.  She obtained a CGM since last visit.  Reviewed HbA1c levels: Lab Results  Component Value Date   HGBA1C 7.2 (H) 02/20/2023   HGBA1C 6.3 (A) 09/28/2021   HGBA1C 7.0 (A) 05/31/2021  06/07/2022: HbA1c 7.2% 05/31/2021: HbA1c 7.0%  She is on: - Metformin ER 750 mg 2x a day - Farxiga 10 mg in am, before b'fast - Levemir 25 >> 30 >> Lantus 20 units at bedtime - Novolog 8-15 units depending on the size of the meal - Trulicity 4.5 mg weekly >> Wegovy 2.4 mg weekly (started 05/2021; declined by insurance 12/2021) >> Trulicity 4.5 mg weekly >> Wegovy 2.4 mg weekly Of note, she could not tolerate Jardiance 10 (started 01/2018) because of increased urination.  Pt checks her sugars >4x a day:  Prev.: - am:   106-179, 194 >> 119-187 >> 84, 116-171 - 2h after b'fast:  n/c >> 124-167, 187 >> 182 - before lunch: 124-151 >> 86-148, 191 >> 98-130 - 2h after lunch:  n/c >> 96-177, 200 >> n/c - before dinner: 129 >> 67-177 >> 113-146 - 2h after dinner: 89-189, 205 >> n/c >> 137-201 - bedtime: 160-200 (snack) >> n/c >> 106-154 - nighttime: n/c Lowest sugar was  54  >> 84 >> 50s; she has hypoglycemia awareness in the 80s. Highest sugar was 501 (streroid) >> .Marland Kitchen.288 >> 201 >> 200s.  + CKD with proteinuria-sees nephrology (Dr. Signe Colt), last BUN/creatinine:  Lab Results  Component Value Date   BUN 13 02/20/2023   CREATININE 0.91 02/20/2023  She is off lisinopril.  + HL; last set of lipids: Lab Results  Component Value Date   CHOL 152 07/16/2022   HDL 49.20 07/16/2022   LDLCALC 81 07/16/2022   TRIG 109.0 07/16/2022   CHOLHDL 3 07/16/2022  06/16/2019: 143/98/44/82 She had leg cramps with Crestor 10. Started drinking more water >> helped.  - last eye exam was: 01/21/2023-no DR; Dr. Dione Booze.  - no numbness and tingling in her feet.  Last foot exam 09/28/2021. On Gabapentin.  Latest TSH: 06/16/2019: 3.279  PCOS: -She had irregular menses since menarche -+1 pregnancies, no miscarriages - was on Mirena IUD - for ~4.5 years >> then on Sprintec since 2016 >> then Orthocycen >> now Nexplanon since 2020 -She continues on metformin ER 750 mg twice a day and spironolactone 50 mg twice a day -this helps with her hirsutism  Latest potassium level is normal: Lab Results  Component Value Date   K 4.6 02/20/2023   ROS: + see HPI  I reviewed pt's medications, allergies, PMH, social hx, family hx, and changes were documented in the  history of present illness. Otherwise, unchanged from my initial visit note.  Past Medical History:  Diagnosis Date   Anxiety    Arthritis    left shoulder   Back pain    Chronic kidney disease    stage 1   Depression    Diabetes mellitus without complication (HCC)    type 2- on meds   Diverticulitis    High cholesterol    History of chicken pox    Hyperlipidemia    on meds   Hypertension    on meds   Joint pain    Lactose intolerance    PCOS (polycystic ovarian syndrome)    Seasonal allergies    Shingles    Sleep apnea    does NOT use CPAP   SVD (spontaneous vaginal delivery) 1995   x 1   Type 2 diabetes  mellitus, uncontrolled 07/30/2006   Qualifier: Diagnosis of  By: Lanier Prude  MD, Cathrine Muster     Past Surgical History:  Procedure Laterality Date   ANKLE FRACTURE SURGERY  05/28/1989   right   CHOLECYSTECTOMY  05/28/2006   COLONOSCOPY  1989   due to blood in stool and diverticulitis per pt   HYSTEROSCOPY N/A 09/13/2014   Procedure: HYSTEROSCOPY with IUD removal;  Surgeon: Mitchel Honour, DO;  Location: [facility];  Service: Gynecology;  Laterality: N/A;  need longest speculum and instruments that aresavailable due to patient's habitus (BMI ~60)   Social History   Socioeconomic History   Marital status: Single    Spouse name: Not on file   Number of children: Not on file   Years of education: Not on file   Highest education level: Not on file  Occupational History   Not on file  Tobacco Use   Smoking status: Never   Smokeless tobacco: Never  Vaping Use   Vaping status: Never Used  Substance and Sexual Activity   Alcohol use: No    Alcohol/week: 0.0 standard drinks of alcohol   Drug use: No   Sexual activity: Yes    Partners: Male    Birth control/protection: Pill  Other Topics Concern   Not on file  Social History Narrative   Single- lives alone   Son 21- senior at [employer]   Enjoys reading   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 30 capsule 0   amphetamine-dextroamphetamine (ADDERALL) 10 MG tablet Take 0.5 tablets (5 mg total) by mouth daily as  needed. 15 tablet 0   cetirizine (ZYRTEC) 10 MG tablet Take 10 mg by mouth daily.     Continuous Blood Gluc Sensor (FREESTYLE LIBRE 3 SENSOR) MISC 1 each by Does not apply route every 14 (fourteen) days. 6 each 3   diazepam (VALIUM) 5 MG tablet Take 1 by mouth 1 hour  pre-procedure with very light food. May bring 2nd tablet to appointment. 2 tablet 0   FARXIGA 10 MG TABS tablet TAKE 1 TABLET  BY MOUTH DAILY BEFORE BREAKFAST AS DIRECTED 90 tablet 0   FLUoxetine (PROZAC) 10 MG capsule Take 1 capsule (10 mg total) by mouth daily. Should be given with prozac 20mg  caps for total of 30 mg/day. 90 capsule 3   FLUoxetine (PROZAC) 20 MG capsule TAKE 1 CAPSULE BY MOUTH EVERY DAY 90 capsule 1   insulin aspart (NOVOLOG FLEXPEN) 100 UNIT/ML FlexPen INJECT 12-15 UNITS (DEPENDING ON MEAL SIZE) UNDER THE SKIN 3 TIMES A DAY 15 mL 1   insulin glargine (LANTUS SOLOSTAR) 100 UNIT/ML Solostar Pen Inject 30 Units into the skin daily. 30 mL 3   Insulin Pen Needle 32G X 4 MM MISC Use to inject insulin 4 times daily 400 each 3   metFORMIN (GLUCOPHAGE-XR) 750 MG 24 hr tablet TAKE 1 TABLET BY MOUTH 2 TIMES DAILY. 180 tablet 3   Multiple Vitamin (MULTIVITAMIN PO) Take 1 tablet by mouth daily at 6 (six) AM.     rosuvastatin (CRESTOR) 10 MG tablet TAKE 1 TABLET BY MOUTH EVERY DAY 90 tablet 1   Semaglutide-Weight Management (WEGOVY) 2.4 MG/0.75ML SOAJ Inject 2.4 mg into the skin once a week. 3 mL 2   spironolactone (ALDACTONE) 50 MG tablet TAKE 1 TABLET BY MOUTH TWICE A DAY 180 tablet 3   Current Facility-Administered Medications  Medication Dose Route Frequency Provider Last Rate Last Admin   methylPREDNISolone acetate (DEPO-MEDROL) injection 40 mg  40 mg Other Once         Allergies  Allergen Reactions   Lisinopril Swelling    Swelling of the left side of tongue   Lipitor [Atorvastatin] Other (See Comments)    Myalgia/foot cramping   Family History  Problem Relation Age of Onset   Obesity Mother    Colon polyps  Mother    Cancer Mother        breast   Hyperlipidemia Mother    Heart disease Mother    Hypertension Mother    Mental illness Mother        depression   Diabetes Mother    Alcoholism Father    Hyperlipidemia Father    Stroke Father    Hypertension Father    Cancer Maternal Grandmother        colon   Diabetes Maternal Grandmother    Diabetes Maternal Aunt    Glaucoma Maternal Aunt    Colon polyps Maternal Uncle    Colon cancer Maternal Uncle    Diabetes Maternal Uncle    Diabetes Maternal Uncle    Esophageal cancer Neg Hx    Rectal cancer Neg Hx    Stomach cancer Neg Hx    PE: BP 120/70   Pulse 94   Ht 5\' 5"  (1.651 m)   Wt 254 lb 9.6 oz (115.5 kg)   SpO2 99%   BMI 42.37 kg/m  Wt Readings from Last 3 Encounters:  03/18/23 254 lb 9.6 oz (115.5 kg)  02/20/23 259 lb (117.5 kg)  07/16/22 255 lb (115.7 kg)   Constitutional: overweight, in NAD. Walks with a cane. Eyes:  EOMI, no exophthalmos ENT: no neck masses, no cervical lymphadenopathy Cardiovascular: tachycardia, RR, No MRG Respiratory: CTA B Musculoskeletal: no deformities Skin:no rashes Neurological: no tremor with outstretched hands  ASSESSMENT: 1. DM2, insulin-dependent, controlled, without long-term complications, but with hyperglycemia  2. PCOS Component     Latest Ref Rng 10/16/2013  Testosterone     10 - 70 ng/dL 54  Sex Hormone Binding     18 - 114 nmol/L 12 (L)  Testosterone Free     0.6 - 6.8 pg/mL 15.5 (H)  Testosterone-% Free     0.4 - 2.4 % 2.9 (H)  TSH     0.35 - 4.50 uIU/mL 0.54  17-OH-Progesterone, LC/MS/MS      <8  Free T4     0.60 - 1.60 ng/dL 9.56  T3, Free     2.3 - 4.2 pg/mL 3.0   Labs confirmed PCOS (high testosterone, low 17-HO Prog, normal TFTs).  LH and FSH were not tested as she was on the Mirena IUD.  3. HL  PLAN:  1. DM2 - Patient with longstanding, reasonably controlled type 2 diabetes, on a complex medication regimen including metformin, SGLT2 inhibitor, weekly  GLP-1 receptor agonist and basal large bolus insulin regimen, with stable control.  Her HbA1c obtained at the end of last month was 7.2%, same as before our last visit.  She has had steroid injections for sciatica since last visit.   -At last visit, per review of her [employer] meter records, sugars are mostly at goal with hyperglycemic spikes.  I did not suggest a change in regimen other than switching from Trulicity to Ozempic due to stronger effect.  She is currently on Wegovy, at the highest dose. CGM interpretation: -At today's visit, we reviewed her CGM downloads: It appears that 86% of values are in target range (goal >70%), while 11% are higher than 180 (goal <25%), and 3% are lower than 70 (goal <4%).  The calculated average blood sugar is 132.  The projected HbA1c for the next 3 months (GMI) is 6.5%. -Reviewing the CGM trends, sugars appear to be fluctuating mainly within the target range but with more pronounced hyperglycemic spikes after lunch and less pronounced after dinner and occasionally after breakfast.  However, she also has low blood sugars in the afternoon so at today's visit we discussed about reducing her NovoLog doses before meals especially before dinner. -I am surprised that her sugars are actually better, despite the fact that she had many dietary indiscretions, got steroid injections, and she is not active due to her back and hip pain. -We did discuss about possibly changing from [facility] to [facility], but we discussed about trying to adjust her diet more as Reginal Lutes was still successful in determining her to lose 5 pounds despite all of the above. -  I suggested to:  Patient Instructions  Please continue: - Metformin ER 1500 mg with dinner - Farxiga 10 mg in am, before b'fast - Lantus 20 units at bedtime - Wegovy 2.4 mg weekly  Change Novolog:  8-10 units for a smaller meal 12-15 units for a larger meal  Please return in 4 months.  - advised to check sugars at different  times of the day - 4x a day, rotating check times - advised for yearly eye exams >> she is UTD - return to clinic in 4 months  2. PCOS -Patient with clinically evident PCOS, confirmed biochemically in 2015, now perimenopausal -She was on oral contraceptives (Ortho-Cyclen), tolerated well >> then Nexplanon -She continues on metformin ER and spironolactone.  She had a slightly high potassium in 2019 while on a potassium supplement, which we stopped.  He is also on an SGLT2 inhibitor, which can raise potassium.  Latest K was reviewed and this was normal Lab Results  Component Value Date   K 4.6 02/20/2023  -She decided against weight loss surgery -She had problems obtaining Wegovy in the past.  She was on Trulicity at last visit  and I advised her to switch to Ozempic, for stronger effect.  However, since then, she was able to obtain [facility], currently on 2.4 mg weekly, tolerated well  3. HL -Reviewed latest lipid panel from 06/2022: Fractions at goal: Lab Results  Component Value Date   CHOL 152 07/16/2022   HDL 49.20 07/16/2022   LDLCALC 81 07/16/2022   TRIG 109.0 07/16/2022   CHOLHDL 3 07/16/2022  -She continues Crestor 10 mg daily.  She had muscle cramps in the past but these resolved with increased water intake.  Carlus Pavlov, MD PhD [facility] Endocrinology

## 2023-03-18 NOTE — Patient Instructions (Addendum)
Please continue: - Metformin ER 1500 mg with dinner - Farxiga 10 mg in am, before b'fast - Lantus 20 units at bedtime - Wegovy 2.4 mg weekly  Change Novolog:  8-10 units for a smaller meal 12-15 units for a larger meal  Please return in 4 months.

## 2023-03-19 ENCOUNTER — Encounter: Payer: Self-pay | Admitting: Sports Medicine

## 2023-03-20 ENCOUNTER — Other Ambulatory Visit: Payer: Self-pay | Admitting: Sports Medicine

## 2023-03-20 DIAGNOSIS — M48061 Spinal stenosis, lumbar region without neurogenic claudication: Secondary | ICD-10-CM

## 2023-03-20 DIAGNOSIS — G8929 Other chronic pain: Secondary | ICD-10-CM

## 2023-03-20 LAB — IRON,TIBC AND FERRITIN PANEL
%SAT: 28
Ferritin: 229
Iron: 80
UIBC: 207

## 2023-03-20 LAB — MICROALBUMIN / CREATININE URINE RATIO
Albumin, Urine POC: 6.6
Microalb Creat Ratio: 10

## 2023-03-20 LAB — CBC AND DIFFERENTIAL
HCT: 43 (ref 36–46)
Hemoglobin: 14.2 (ref 12.0–16.0)
Platelets: 345 10*3/uL (ref 150–400)
WBC: 8

## 2023-03-20 LAB — BASIC METABOLIC PANEL
BUN: 16 (ref 4–21)
Chloride: 101 (ref 99–108)
Glucose: 152
Potassium: 5.1 meq/L (ref 3.5–5.1)
Sodium: 136 — AB (ref 137–147)

## 2023-03-20 LAB — COMPREHENSIVE METABOLIC PANEL
Albumin: 4.2 (ref 3.5–5.0)
Calcium: 9.9 (ref 8.7–10.7)
eGFR: 80

## 2023-03-20 LAB — CBC: RBC: 4.883 (ref 3.87–5.11)

## 2023-03-20 LAB — PROTEIN / CREATININE RATIO, URINE
Albumin, U: 6.6
Creatinine, Urine: 66

## 2023-03-20 NOTE — Telephone Encounter (Signed)
New PT referral sent for LBP with Left-sided radiculopathy and foraminal stenosis.

## 2023-03-21 ENCOUNTER — Encounter: Payer: 59 | Admitting: Rehabilitative and Restorative Service Providers"

## 2023-03-24 NOTE — Progress Notes (Signed)
Audrey Goodman - 52 y.o. female MRN [id number]  Date of birth: 23-Apr-1971  Office Visit Note: Visit Date: 03/07/2023 PCP: [name], NP Referred by: [name], NP  Subjective: Chief Complaint  Patient presents with   Lower Back - Pain   HPI:  Audrey Goodman is a 52 y.o. female who comes in today at the request of Dr. Madelyn Brunner for planned Left L3-4 Lumbar Transforaminal epidural steroid injection with fluoroscopic guidance.  The patient has failed conservative care including home exercise, medications, time and activity modification.  This injection will be diagnostic and hopefully therapeutic.  Please see requesting physician notes for further details and justification.   ROS Otherwise per HPI.  Assessment & Plan: Visit Diagnoses:    ICD-10-CM   1. Lumbar radiculopathy  M54.16 XR C-ARM NO REPORT    Epidural Steroid injection    methylPREDNISolone acetate (DEPO-MEDROL) injection 40 mg      Plan: No additional findings.   Meds & Orders:  Meds ordered this encounter  Medications   methylPREDNISolone acetate (DEPO-MEDROL) injection 40 mg    Orders Placed This Encounter  Procedures   XR C-ARM NO REPORT   Epidural Steroid injection    Follow-up: Return for visit to requesting provider as needed.   Procedures: No procedures performed  Lumbosacral Transforaminal Epidural Steroid Injection - Sub-Pedicular Approach with Fluoroscopic Guidance  Patient: Audrey Goodman      Date of Birth: [birth year] MRN: [id number] PCP: [name], NP      Visit Date: 03/07/2023   Universal Protocol:    Date/Time: 03/07/2023  Consent Given By: the patient  Position: PRONE  Additional Comments: Vital signs were monitored before and after the procedure. Patient was prepped and draped in the usual sterile fashion. The correct patient, procedure, and site was verified.   Injection Procedure Details:   Procedure diagnoses: Lumbar radiculopathy [M54.16]     Meds Administered:  Meds ordered this encounter  Medications   methylPREDNISolone acetate (DEPO-MEDROL) injection 40 mg    Laterality: Left  Location/Site: L3  Needle:5.0 in., 22 ga.  Short bevel or Quincke spinal needle  Needle Placement: Transforaminal  Findings:    -Comments: Excellent flow of contrast along the nerve, nerve root and into the epidural space.  Procedure Details: After squaring off the end-plates to get a true AP view, the C-arm was positioned so that an oblique view of the foramen as noted above was visualized. The target area is just inferior to the "nose of the scotty dog" or sub pedicular. The soft tissues overlying this structure were infiltrated with 2-3 ml. of 1% Lidocaine without Epinephrine.  The spinal needle was inserted toward the target using a "trajectory" view along the fluoroscope beam.  Under AP and lateral visualization, the needle was advanced so it did not puncture dura and was located close the 6 O'Clock position of the pedical in AP tracterory. Biplanar projections were used to confirm position. Aspiration was confirmed to be negative for CSF and/or blood. A 1-2 ml. volume of Isovue-250 was injected and flow of contrast was noted at each level. Radiographs were obtained for documentation purposes.   After attaining the desired flow of contrast documented above, a 0.5 to 1.0 ml test dose of 0.25% Marcaine was injected into each respective transforaminal space.  The patient was observed for 90 seconds post injection.  After no sensory deficits were reported, and normal lower extremity motor function was noted,   the above injectate was administered so  that equal amounts of the injectate were placed at each foramen (level) into the transforaminal epidural space.   Additional Comments:  No complications occurred Dressing: 2 x 2 sterile gauze and Band-Aid    Post-procedure details: Patient was observed during the procedure. Post-procedure  instructions were reviewed.  Patient left the clinic in stable condition.    Clinical History: No specialty comments available.     Objective:  VS:  HT:    WT:   BMI:     BP:128/82  HR:98bpm  TEMP: ( )  RESP:  Physical Exam Vitals and nursing note reviewed.  Constitutional:      General: She is not in acute distress.    Appearance: Normal appearance. She is obese. She is not ill-appearing.  HENT:     Head: Normocephalic and atraumatic.     Right Ear: External ear normal.     Left Ear: External ear normal.  Eyes:     Extraocular Movements: Extraocular movements intact.  Cardiovascular:     Rate and Rhythm: Normal rate.     Pulses: Normal pulses.  Pulmonary:     Effort: Pulmonary effort is normal. No respiratory distress.  Abdominal:     General: There is no distension.     Palpations: Abdomen is soft.  Musculoskeletal:        General: Tenderness present.     Cervical back: Neck supple.     Right lower leg: No edema.     Left lower leg: No edema.     Comments: Patient has good distal strength with no pain over the greater trochanters.  No clonus or focal weakness.  Skin:    Findings: No erythema, lesion or rash.  Neurological:     General: No focal deficit present.     Mental Status: She is alert and oriented to person, place, and time.     Sensory: No sensory deficit.     Motor: No weakness or abnormal muscle tone.     Coordination: Coordination normal.  Psychiatric:        Mood and Affect: Mood normal.        Behavior: Behavior normal.      Imaging: No results found.

## 2023-03-24 NOTE — Procedures (Signed)
Lumbosacral Transforaminal Epidural Steroid Injection - Sub-Pedicular Approach with Fluoroscopic Guidance  Patient: Audrey Goodman      Date of Birth: 06/17/1970 MRN: [id number] PCP: [name], NP      Visit Date: 03/07/2023   Universal Protocol:    Date/Time: 03/07/2023  Consent Given By: the patient  Position: PRONE  Additional Comments: Vital signs were monitored before and after the procedure. Patient was prepped and draped in the usual sterile fashion. The correct patient, procedure, and site was verified.   Injection Procedure Details:   Procedure diagnoses: Lumbar radiculopathy [M54.16]    Meds Administered:  Meds ordered this encounter  Medications   methylPREDNISolone acetate (DEPO-MEDROL) injection 40 mg    Laterality: Left  Location/Site: L3  Needle:5.0 in., 22 ga.  Short bevel or Quincke spinal needle  Needle Placement: Transforaminal  Findings:    -Comments: Excellent flow of contrast along the nerve, nerve root and into the epidural space.  Procedure Details: After squaring off the end-plates to get a true AP view, the C-arm was positioned so that an oblique view of the foramen as noted above was visualized. The target area is just inferior to the "nose of the scotty dog" or sub pedicular. The soft tissues overlying this structure were infiltrated with 2-3 ml. of 1% Lidocaine without Epinephrine.  The spinal needle was inserted toward the target using a "trajectory" view along the fluoroscope beam.  Under AP and lateral visualization, the needle was advanced so it did not puncture dura and was located close the 6 O'Clock position of the pedical in AP tracterory. Biplanar projections were used to confirm position. Aspiration was confirmed to be negative for CSF and/or blood. A 1-2 ml. volume of Isovue-250 was injected and flow of contrast was noted at each level. Radiographs were obtained for documentation purposes.   After attaining the desired  flow of contrast documented above, a 0.5 to 1.0 ml test dose of 0.25% Marcaine was injected into each respective transforaminal space.  The patient was observed for 90 seconds post injection.  After no sensory deficits were reported, and normal lower extremity motor function was noted,   the above injectate was administered so that equal amounts of the injectate were placed at each foramen (level) into the transforaminal epidural space.   Additional Comments:  No complications occurred Dressing: 2 x 2 sterile gauze and Band-Aid    Post-procedure details: Patient was observed during the procedure. Post-procedure instructions were reviewed.  Patient left the clinic in stable condition.

## 2023-03-25 ENCOUNTER — Other Ambulatory Visit: Payer: Self-pay | Admitting: Internal Medicine

## 2023-04-05 ENCOUNTER — Ambulatory Visit: Payer: 59 | Admitting: Physical Therapy

## 2023-04-08 ENCOUNTER — Other Ambulatory Visit: Payer: Self-pay | Admitting: Family

## 2023-04-08 DIAGNOSIS — F902 Attention-deficit hyperactivity disorder, combined type: Secondary | ICD-10-CM

## 2023-04-09 MED ORDER — AMPHETAMINE-DEXTROAMPHET ER 30 MG PO CP24
30.0000 mg | ORAL_CAPSULE | ORAL | 0 refills | Status: DC
Start: 2023-04-09 — End: 2023-06-04

## 2023-04-10 ENCOUNTER — Encounter: Payer: Self-pay | Admitting: Family

## 2023-04-10 ENCOUNTER — Other Ambulatory Visit: Payer: Self-pay | Admitting: Family

## 2023-04-10 MED ORDER — FLUTICASONE PROPIONATE 50 MCG/ACT NA SUSP: 2.0000 | Freq: Every day | NASAL | 1 refills | Status: AC

## 2023-04-22 ENCOUNTER — Other Ambulatory Visit: Payer: Self-pay | Admitting: Internal Medicine

## 2023-04-23 ENCOUNTER — Other Ambulatory Visit: Payer: Self-pay | Admitting: Internal Medicine

## 2023-04-24 ENCOUNTER — Encounter: Payer: Self-pay | Admitting: Family

## 2023-04-24 DIAGNOSIS — G4733 Obstructive sleep apnea (adult) (pediatric): Secondary | ICD-10-CM

## 2023-04-30 NOTE — Addendum Note (Signed)
Addended by: [name] on: 04/30/2023 01:23 PM   Modules accepted: Orders

## 2023-05-01 ENCOUNTER — Encounter: Payer: Self-pay | Admitting: Sports Medicine

## 2023-05-02 ENCOUNTER — Encounter: Payer: Self-pay | Admitting: Sports Medicine

## 2023-05-06 ENCOUNTER — Other Ambulatory Visit: Payer: Self-pay | Admitting: Internal Medicine

## 2023-05-17 ENCOUNTER — Ambulatory Visit: Payer: 59 | Admitting: Sports Medicine

## 2023-05-24 ENCOUNTER — Encounter: Payer: Self-pay | Admitting: Internal Medicine

## 2023-05-28 MED ORDER — DAPAGLIFLOZIN PROPANEDIOL 10 MG PO TABS: 10.0000 mg | ORAL_TABLET | Freq: Every day | ORAL | 1 refills | Status: DC

## 2023-05-28 MED ORDER — DEXCOM G7 SENSOR MISC: 1.0000 | 3 refills | Status: AC

## 2023-05-31 ENCOUNTER — Other Ambulatory Visit: Payer: Self-pay

## 2023-05-31 DIAGNOSIS — E1165 Type 2 diabetes mellitus with hyperglycemia: Secondary | ICD-10-CM

## 2023-05-31 MED ORDER — NOVOLOG FLEXPEN 100 UNIT/ML ~~LOC~~ SOPN: PEN_INJECTOR | SUBCUTANEOUS | 1 refills | Status: DC

## 2023-05-31 MED ORDER — INSULIN PEN NEEDLE 32G X 4 MM MISC: 3 refills | Status: AC

## 2023-05-31 NOTE — Progress Notes (Signed)
 Requested Prescriptions   Signed Prescriptions Disp Refills   Insulin  Pen Needle 32G X 4 MM MISC 400 each 3    Sig: Use to inject insulin  4 times daily   insulin  aspart (NOVOLOG  FLEXPEN) 100 UNIT/ML FlexPen 45 mL 1    Sig: INJECT 12-15 UNITS (DEPENDING ON MEAL SIZE) UNDER THE SKIN 3 TIMES A DAY

## 2023-06-04 ENCOUNTER — Encounter: Payer: Self-pay | Admitting: Family

## 2023-06-04 DIAGNOSIS — F902 Attention-deficit hyperactivity disorder, combined type: Secondary | ICD-10-CM

## 2023-06-04 MED ORDER — AMPHETAMINE-DEXTROAMPHET ER 30 MG PO CP24: 30.0000 mg | ORAL_CAPSULE | ORAL | 0 refills | Status: DC

## 2023-06-05 ENCOUNTER — Ambulatory Visit: Payer: 59 | Admitting: Sports Medicine

## 2023-06-06 ENCOUNTER — Encounter: Payer: Self-pay | Admitting: Sports Medicine

## 2023-06-07 ENCOUNTER — Ambulatory Visit: Payer: 59 | Admitting: Sports Medicine

## 2023-06-07 ENCOUNTER — Encounter: Payer: Self-pay | Admitting: Sports Medicine

## 2023-06-07 DIAGNOSIS — M5442 Lumbago with sciatica, left side: Secondary | ICD-10-CM

## 2023-06-07 DIAGNOSIS — M1612 Unilateral primary osteoarthritis, left hip: Secondary | ICD-10-CM

## 2023-06-07 DIAGNOSIS — M48061 Spinal stenosis, lumbar region without neurogenic claudication: Secondary | ICD-10-CM | POA: Diagnosis not present

## 2023-06-07 DIAGNOSIS — G8929 Other chronic pain: Secondary | ICD-10-CM

## 2023-06-07 NOTE — Progress Notes (Signed)
 Patient says that her previous two injections (one into the groin, one into L3) did help her pain but it has not gone away completely. She says that her pain starts in the back of her hip and wraps around the front of the leg and down to the knee, but does not go past the knee. She is inquiring about injection into L4 and whether that would be helpful.

## 2023-06-07 NOTE — Progress Notes (Signed)
 Audrey Goodman - 53 y.o. female MRN [id number]  Date of birth: Nov 16, 1970  Office Visit Note: Visit Date: 06/07/2023 PCP: Daryl Setter, NP Referred by: Daryl Setter, NP  Subjective: Chief Complaint  Patient presents with   Lower Back - Pain, Follow-up   HPI: Audrey Goodman is a pleasant 53 y.o. female who presents today for follow-up of chronic LBP with known foraminal stenosis.  [name] continues with back pain that at times will radiate into the left leg but it is better than before our treatments.  Back in July we did perform an intra-articular left hip injection which helped the pain in the groin and the hip and her overall pain by about 25-30%. On 03/07/2023 she did have a left L3-L4 transforaminal epidural steroid injection with Dr. Eldonna and this brought her pain down to a 40%.  She was able to get back to [place] with her son, she did use a scooter to get around.  Was able to tolerate this better but still had definite worsening of her pain with standing and prolonged walking.  Still using gabapentin 300 mg a.m., 600-900mg  at bedtime.  She is continuing her home exercise program and through her work she does have access to PT through [employer] [facility].  Her back pain starts in the buttock low back will wraparound the lateral hip to the anterior thigh to the anterior knee on the left.  Pertinent ROS were reviewed with the patient and found to be negative unless otherwise specified above in HPI.   Assessment & Plan: Visit Diagnoses:  1. Foraminal stenosis of lumbar region   2. Chronic left-sided low back pain with left-sided sciatica   3. Unilateral primary osteoarthritis, left hip    Plan: Impression is improving but still continued low back pain with left-sided radiculopathy with evidence of foraminal stenosis of the lumbar spine.  She does also have left hip arthritis which is a contributing factor, but I believe her back is the most contributing factor to her pain.  She has  received relief both from an intra-articular hip injection months ago and a left L3 transforaminal injection with Dr. Eldonna.  At this point, I think for both diagnostic and therapeutic purposes she should see Dr. Eldonna for a left L4 transforaminal injection, but we will leave the exact site up to him. I did discuss this with him today.  It is important for Audrey Goodman to continue her home therapy and her virtual PT.  I also discussed referral for possible aquatic-based physical therapy, she will think about this and will let me know.  She will continue her gabapentin 300 mg a.m., 600-900 mg nightly.  She will notify me or we will see her in follow-up status post lumbar injection with Dr. Eldonna  to discuss next steps. At this point, I see no surgical indication for her which is reassuring.  Follow-up: Return for Schedule  with Dr. Eldonna for ESI.   Meds & Orders: No orders of the defined types were placed in this encounter.   Orders Placed This Encounter  Procedures   Ambulatory referral to Physical Medicine Rehab     Procedures: No procedures performed      Clinical History: No specialty comments available.  She reports that she has never smoked. She has never used smokeless tobacco.  Recent Labs    02/20/23 1014  HGBA1C 7.2*    Objective:     Physical Exam  Gen: Well-appearing, in no acute distress; non-toxic CV: Well-perfused.  Warm.  Resp: Breathing unlabored on room air; no wheezing. Psych: Fluid speech in conversation; appropriate affect; normal thought process  Ortho Exam - Lumbar: There is tenderness over the left side from the L4-L5 level.  There is mild worsening of pain with both flexion as well as extension.  Equivocal straight leg raise.   - Left hip: No greater trochanter TTP.  There is mild pain with FADIR and FABER testing.  No swelling.  Imaging:  - no new today  Past Medical/Family/Surgical/Social History: Medications & Allergies reviewed per EMR, new  medications updated. Patient Active Problem List   Diagnosis Date Noted   Insomnia 02/20/2023   Attention deficit hyperactivity disorder, combined type, moderate 03/01/2022   Impacted cerumen of both ears 01/23/2021   Left Achilles tendinitis 06/19/2017   Depression 07/13/2015   Preventative health care 06/23/2014   PCOS (polycystic ovarian syndrome) 09/10/2013   CHOLELITHIASIS 02/20/2008   Hyperlipidemia 08/06/2006   Uncontrolled diabetes mellitus with hyperglycemia (HCC) 07/30/2006   HYPERTENSION, BENIGN ESSENTIAL 07/30/2006   MORBID OBESITY 07/25/2006   Past Medical History:  Diagnosis Date   Anxiety    Arthritis    left shoulder   Back pain    Chronic kidney disease    stage 1   Depression    Diabetes mellitus without complication (HCC)    type 2- on meds   Diverticulitis    High cholesterol    History of chicken pox    Hyperlipidemia    on meds   Hypertension    on meds   Joint pain    Lactose intolerance    PCOS (polycystic ovarian syndrome)    Seasonal allergies    Shingles    Sleep apnea    does NOT use CPAP   SVD (spontaneous vaginal delivery) 1995   x 1   Type 2 diabetes mellitus, uncontrolled 07/30/2006   Qualifier: Diagnosis of  By: Lucille  MD, Preston     Family History  Problem Relation Age of Onset   Obesity Mother    Colon polyps Mother    Cancer Mother        breast   Hyperlipidemia Mother    Heart disease Mother    Hypertension Mother    Mental illness Mother        depression   Diabetes Mother    Alcoholism Father    Hyperlipidemia Father    Stroke Father    Hypertension Father    Cancer Maternal Grandmother        colon   Diabetes Maternal Grandmother    Diabetes Maternal Aunt    Glaucoma Maternal Aunt    Colon polyps Maternal Uncle    Colon cancer Maternal Uncle    Diabetes Maternal Uncle    Diabetes Maternal Uncle    Esophageal cancer Neg Hx    Rectal cancer Neg Hx    Stomach cancer Neg Hx    Past Surgical History:   Procedure Laterality Date   ANKLE FRACTURE SURGERY  05/28/1989   right   CHOLECYSTECTOMY  05/28/2006   COLONOSCOPY  1989   due to blood in stool and diverticulitis per pt   HYSTEROSCOPY N/A 09/13/2014   Procedure: HYSTEROSCOPY with IUD removal;  Surgeon: Duwaine Blumenthal, DO;  Location: [facility];  Service: Gynecology;  Laterality: N/A;  need longest speculum and instruments that aresavailable due to patient's habitus (BMI ~60)   Social History   Occupational History   Not on file  Tobacco Use   Smoking  status: Never   Smokeless tobacco: Never  Vaping Use   Vaping status: Never Used  Substance and Sexual Activity   Alcohol use: No    Alcohol/week: 0.0 standard drinks of alcohol   Drug use: No   Sexual activity: Yes    Partners: Male    Birth control/protection: Pill

## 2023-06-10 ENCOUNTER — Telehealth: Payer: Self-pay | Admitting: *Deleted

## 2023-06-10 DIAGNOSIS — F902 Attention-deficit hyperactivity disorder, combined type: Secondary | ICD-10-CM

## 2023-06-10 NOTE — Telephone Encounter (Signed)
 Audrey Goodman, Audrey Goodman routed this conversation to [employer] [name]  CR   06/10/23 11:38 AM Note Copied from CRM #[id number]. Topic: Clinical - Prescription Issue >> Jun 10, 2023  9:32 AM [name] wrote: Reason for CRM:  [name] from [employer] called, needs DEA number for amphetamine -dextroamphetamine  (ADDERALL XR) 30 MG 24 hr capsule.    Callback number is [phone] option 2  reference number [id number].       [name]  CR   06/10/23 10:23 AM Note Copied from CRM [id number]. Topic: Clinical - Prescription Issue >> Jun 07, 2023  9:42 AM [name] wrote: Reason for CRM: Pharmacy called, needs DEA number for prescriber of patient's medication. Callback number is [phone] option 2  reference number [id number]. Thank you

## 2023-06-10 NOTE — Telephone Encounter (Signed)
 Copied from CRM [id number]. Topic: Clinical - Prescription Issue >> Jun 07, 2023  9:42 AM [name] wrote: Reason for CRM: Pharmacy called, needs DEA number for prescriber of patient's medication. Callback number is [phone] option 2  reference number [id number]. Thank you

## 2023-06-10 NOTE — Telephone Encounter (Signed)
 Copied from CRM [id number]. Topic: Clinical - Prescription Issue >> Jun 10, 2023  9:32 AM [name] wrote: Reason for CRM:  [name] from [employer] called, needs DEA number for amphetamine -dextroamphetamine  (ADDERALL XR) 30 MG 24 hr capsule.   Callback number is [phone] option 2  reference number [id number].

## 2023-06-10 NOTE — Telephone Encounter (Signed)
 Can you please call [facility] and give them my DEA?

## 2023-06-11 MED ORDER — AMPHETAMINE-DEXTROAMPHET ER 30 MG PO CP24: 30.0000 mg | ORAL_CAPSULE | ORAL | 0 refills | Status: DC

## 2023-06-11 NOTE — Telephone Encounter (Signed)
 Copied from CRM [id number]. Topic: Clinical - Prescription Issue >> Jun 11, 2023  9:39 AM Pinkey ORN wrote: Reason for CRM: Adderall XR >> Jun 11, 2023  9:42 AM Pinkey ORN wrote: [facility]  [phone] EXT 2 Reference # [id number] States they'll only make 3 attempts to collect this information before they cancel the order, if the requested information is not received today the prescription will not be filled and in order to do so a new order will have to be sent over to the pharmacy.

## 2023-06-18 ENCOUNTER — Ambulatory Visit: Payer: 59 | Admitting: Physical Medicine and Rehabilitation

## 2023-06-18 ENCOUNTER — Other Ambulatory Visit: Payer: Self-pay

## 2023-06-18 DIAGNOSIS — M5416 Radiculopathy, lumbar region: Secondary | ICD-10-CM | POA: Diagnosis not present

## 2023-06-18 MED ORDER — METHYLPREDNISOLONE ACETATE 40 MG/ML IJ SUSP
40.0000 mg | Freq: Once | INTRAMUSCULAR | Status: AC
  Administered 2023-06-18: 40 mg

## 2023-06-18 NOTE — Patient Instructions (Signed)

## 2023-06-18 NOTE — Progress Notes (Signed)
Functional Pain Scale - descriptive words and definitions  Uncomfortable (3)  Pain is present but can complete all ADL's/sleep is slightly affected and passive distraction only gives marginal relief. Mild range order  Average Pain 5 Pain going down L leg.  +Driver, -BT, -Dye Allergies.

## 2023-06-21 ENCOUNTER — Encounter: Payer: Self-pay | Admitting: Internal Medicine

## 2023-06-21 DIAGNOSIS — E282 Polycystic ovarian syndrome: Secondary | ICD-10-CM

## 2023-06-21 DIAGNOSIS — E1165 Type 2 diabetes mellitus with hyperglycemia: Secondary | ICD-10-CM

## 2023-06-21 MED ORDER — WEGOVY 2.4 MG/0.75ML ~~LOC~~ SOAJ: 2.4000 mg | SUBCUTANEOUS | 2 refills | Status: DC

## 2023-06-24 MED ORDER — NOVOLOG FLEXPEN 100 UNIT/ML ~~LOC~~ SOPN: 12.0000 [IU] | PEN_INJECTOR | Freq: Three times a day (TID) | SUBCUTANEOUS | 3 refills | Status: AC

## 2023-06-25 ENCOUNTER — Other Ambulatory Visit (HOSPITAL_COMMUNITY): Payer: Self-pay

## 2023-06-25 ENCOUNTER — Telehealth: Payer: Self-pay

## 2023-06-25 ENCOUNTER — Encounter: Payer: Self-pay | Admitting: Family

## 2023-06-25 ENCOUNTER — Other Ambulatory Visit: Payer: Self-pay | Admitting: Internal Medicine

## 2023-06-25 DIAGNOSIS — E1165 Type 2 diabetes mellitus with hyperglycemia: Secondary | ICD-10-CM

## 2023-06-25 DIAGNOSIS — E282 Polycystic ovarian syndrome: Secondary | ICD-10-CM

## 2023-06-25 MED ORDER — SEMAGLUTIDE-WEIGHT MANAGEMENT 2.4 MG/0.75ML ~~LOC~~ SOAJ: 2.4000 mg | SUBCUTANEOUS | 3 refills | Status: DC

## 2023-06-25 MED ORDER — ROSUVASTATIN CALCIUM 10 MG PO TABS: 10.0000 mg | ORAL_TABLET | Freq: Every day | ORAL | 1 refills | Status: DC

## 2023-06-25 MED ORDER — WEGOVY 2.4 MG/0.75ML ~~LOC~~ SOAJ: 2.4000 mg | SUBCUTANEOUS | 2 refills | Status: DC

## 2023-06-25 NOTE — Telephone Encounter (Signed)
Tried sending this medication in different encounters but it fails each time. Printed the script for [name] to sign

## 2023-06-25 NOTE — Telephone Encounter (Signed)
Pt needs PA for Wegovy 2.4 mg

## 2023-06-25 NOTE — Telephone Encounter (Signed)
[facility].   That's why the rx script kept failing to the mail order.

## 2023-06-25 NOTE — Telephone Encounter (Signed)
Pharmacy Patient Advocate Encounter   Received notification from Pt Calls Messages that prior authorization for [facility] is required/requested.   Insurance verification completed.   The patient is insured through [facility] .   Per test claim: PA required; PA submitted to above mentioned insurance via CoverMyMeds Key/confirmation #/EOC [id number] Status is pending

## 2023-06-27 ENCOUNTER — Encounter: Payer: Self-pay | Admitting: Sports Medicine

## 2023-06-27 NOTE — Procedures (Signed)
Lumbosacral Transforaminal Epidural Steroid Injection - Sub-Pedicular Approach with Fluoroscopic Guidance  Patient: Audrey Goodman      Date of Birth: [birth year] MRN: [id number] PCP: [name], NP      Visit Date: 06/18/2023   Universal Protocol:    Date/Time: 06/18/2023  Consent Given By: the patient  Position: PRONE  Additional Comments: Vital signs were monitored before and after the procedure. Patient was prepped and draped in the usual sterile fashion. The correct patient, procedure, and site was verified.   Injection Procedure Details:   Procedure diagnoses: Lumbar radiculopathy [M54.16]    Meds Administered:  Meds ordered this encounter  Medications   methylPREDNISolone acetate (DEPO-MEDROL) injection 40 mg    Laterality: Left  Location/Site: L4  Needle:6.0 in., 22 ga.  Short bevel or Quincke spinal needle  Needle Placement: Transforaminal  Findings:    -Comments: Excellent flow of contrast along the nerve, nerve root and into the epidural space.  Procedure Details: After squaring off the end-plates to get a true AP view, the C-arm was positioned so that an oblique view of the foramen as noted above was visualized. The target area is just inferior to the "nose of the scotty dog" or sub pedicular. The soft tissues overlying this structure were infiltrated with 2-3 ml. of 1% Lidocaine without Epinephrine.  The spinal needle was inserted toward the target using a "trajectory" view along the fluoroscope beam.  Under AP and lateral visualization, the needle was advanced so it did not puncture dura and was located close the 6 O'Clock position of the pedical in AP tracterory. Biplanar projections were used to confirm position. Aspiration was confirmed to be negative for CSF and/or blood. A 1-2 ml. volume of Isovue-250 was injected and flow of contrast was noted at each level. Radiographs were obtained for documentation purposes.   After attaining the desired  flow of contrast documented above, a 0.5 to 1.0 ml test dose of 0.25% Marcaine was injected into each respective transforaminal space.  The patient was observed for 90 seconds post injection.  After no sensory deficits were reported, and normal lower extremity motor function was noted,   the above injectate was administered so that equal amounts of the injectate were placed at each foramen (level) into the transforaminal epidural space.   Additional Comments:  The patient tolerated the procedure well Dressing: 2 x 2 sterile gauze and Band-Aid    Post-procedure details: Patient was observed during the procedure. Post-procedure instructions were reviewed.  Patient left the clinic in stable condition.

## 2023-06-27 NOTE — Progress Notes (Signed)
Audrey Goodman - 53 y.o. female MRN [id number]  Date of birth: June 28, 1970  Office Visit Note: Visit Date: 06/18/2023 PCP: [name], NP Referred by: Madelyn Brunner, DO  Subjective: Chief Complaint  Patient presents with   Lower Back - Pain   HPI:  Audrey Goodman is a 53 y.o. female who comes in today at the request of Dr. Madelyn Brunner for planned Left L4-5 Lumbar Transforaminal epidural steroid injection with fluoroscopic guidance.  The patient has failed conservative care including home exercise, medications, time and activity modification.  This injection will be diagnostic and hopefully therapeutic.  Please see requesting physician notes for further details and justification.   ROS Otherwise per HPI.  Assessment & Plan: Visit Diagnoses:    ICD-10-CM   1. Lumbar radiculopathy  M54.16 methylPREDNISolone acetate (DEPO-MEDROL) injection 40 mg    XR C-ARM NO REPORT    Epidural Steroid injection      Plan: No additional findings.   Meds & Orders:  Meds ordered this encounter  Medications   methylPREDNISolone acetate (DEPO-MEDROL) injection 40 mg    Orders Placed This Encounter  Procedures   XR C-ARM NO REPORT   Epidural Steroid injection    Follow-up: Return for visit to requesting provider as needed.   Procedures: No procedures performed  Lumbosacral Transforaminal Epidural Steroid Injection - Sub-Pedicular Approach with Fluoroscopic Guidance  Patient: Audrey Goodman      Date of Birth: [birth year] MRN: [id number] PCP: [name], NP      Visit Date: 06/18/2023   Universal Protocol:    Date/Time: 06/18/2023  Consent Given By: the patient  Position: PRONE  Additional Comments: Vital signs were monitored before and after the procedure. Patient was prepped and draped in the usual sterile fashion. The correct patient, procedure, and site was verified.   Injection Procedure Details:   Procedure diagnoses: Lumbar radiculopathy [M54.16]    Meds  Administered:  Meds ordered this encounter  Medications   methylPREDNISolone acetate (DEPO-MEDROL) injection 40 mg    Laterality: Left  Location/Site: L4  Needle:6.0 in., 22 ga.  Short bevel or Quincke spinal needle  Needle Placement: Transforaminal  Findings:    -Comments: Excellent flow of contrast along the nerve, nerve root and into the epidural space.  Procedure Details: After squaring off the end-plates to get a true AP view, the C-arm was positioned so that an oblique view of the foramen as noted above was visualized. The target area is just inferior to the "nose of the scotty dog" or sub pedicular. The soft tissues overlying this structure were infiltrated with 2-3 ml. of 1% Lidocaine without Epinephrine.  The spinal needle was inserted toward the target using a "trajectory" view along the fluoroscope beam.  Under AP and lateral visualization, the needle was advanced so it did not puncture dura and was located close the 6 O'Clock position of the pedical in AP tracterory. Biplanar projections were used to confirm position. Aspiration was confirmed to be negative for CSF and/or blood. A 1-2 ml. volume of Isovue-250 was injected and flow of contrast was noted at each level. Radiographs were obtained for documentation purposes.   After attaining the desired flow of contrast documented above, a 0.5 to 1.0 ml test dose of 0.25% Marcaine was injected into each respective transforaminal space.  The patient was observed for 90 seconds post injection.  After no sensory deficits were reported, and normal lower extremity motor function was noted,   the above injectate was administered so  that equal amounts of the injectate were placed at each foramen (level) into the transforaminal epidural space.   Additional Comments:  The patient tolerated the procedure well Dressing: 2 x 2 sterile gauze and Band-Aid    Post-procedure details: Patient was observed during the procedure. Post-procedure  instructions were reviewed.  Patient left the clinic in stable condition.    Clinical History: No specialty comments available.     Objective:  VS:  HT:    WT:   BMI:     BP:   HR: bpm  TEMP: ( )  RESP:  Physical Exam Vitals and nursing note reviewed.  Constitutional:      General: She is not in acute distress.    Appearance: Normal appearance. She is obese. She is not ill-appearing.  HENT:     Head: Normocephalic and atraumatic.     Right Ear: External ear normal.     Left Ear: External ear normal.  Eyes:     Extraocular Movements: Extraocular movements intact.  Cardiovascular:     Rate and Rhythm: Normal rate.     Pulses: Normal pulses.  Pulmonary:     Effort: Pulmonary effort is normal. No respiratory distress.  Abdominal:     General: There is no distension.     Palpations: Abdomen is soft.  Musculoskeletal:        General: Tenderness present.     Cervical back: Neck supple.     Right lower leg: No edema.     Left lower leg: No edema.     Comments: Patient has good distal strength with no pain over the greater trochanters.  No clonus or focal weakness.  Skin:    Findings: No erythema, lesion or rash.  Neurological:     General: No focal deficit present.     Mental Status: She is alert and oriented to person, place, and time.     Sensory: No sensory deficit.     Motor: No weakness or abnormal muscle tone.     Coordination: Coordination normal.  Psychiatric:        Mood and Affect: Mood normal.        Behavior: Behavior normal.      Imaging: No results found.

## 2023-06-28 NOTE — Telephone Encounter (Signed)
Pharmacy Patient Advocate Encounter  Received notification from [facility] that Prior Authorization for wegovy has been APPROVED through 06/24/24

## 2023-06-29 ENCOUNTER — Encounter: Payer: Self-pay | Admitting: Sports Medicine

## 2023-06-30 ENCOUNTER — Encounter: Payer: Self-pay | Admitting: Family

## 2023-07-01 ENCOUNTER — Other Ambulatory Visit: Payer: Self-pay | Admitting: Sports Medicine

## 2023-07-01 DIAGNOSIS — M48061 Spinal stenosis, lumbar region without neurogenic claudication: Secondary | ICD-10-CM

## 2023-07-01 DIAGNOSIS — G8929 Other chronic pain: Secondary | ICD-10-CM

## 2023-07-01 MED ORDER — FLUOXETINE HCL 20 MG PO CAPS: 20.0000 mg | ORAL_CAPSULE | Freq: Every day | ORAL | 1 refills | Status: DC

## 2023-07-02 ENCOUNTER — Encounter (HOSPITAL_BASED_OUTPATIENT_CLINIC_OR_DEPARTMENT_OTHER): Payer: Self-pay | Admitting: Physical Therapy

## 2023-07-08 ENCOUNTER — Telehealth: Payer: Self-pay | Admitting: *Deleted

## 2023-07-08 NOTE — Telephone Encounter (Signed)
 Called and spoke with patient, she states she does not have a CPAP or Bipap machine and would like to avoid using one.  She had a sleep study done years ago in [place], but does not remember where.  Nothing further needed.

## 2023-07-09 ENCOUNTER — Encounter: Payer: Self-pay | Admitting: Adult Health

## 2023-07-09 ENCOUNTER — Ambulatory Visit (INDEPENDENT_AMBULATORY_CARE_PROVIDER_SITE_OTHER): Payer: 59 | Admitting: Adult Health

## 2023-07-09 VITALS — BP 121/81 | HR 100 | Temp 99.4°F | Ht 65.0 in | Wt 262.0 lb

## 2023-07-09 DIAGNOSIS — R0683 Snoring: Secondary | ICD-10-CM

## 2023-07-09 NOTE — Patient Instructions (Signed)
Set up for home sleep study  Healthy sleep regimen  Do not drive if sleepy  Follow up in 6 weeks follow up -Virtual Friday clinic

## 2023-07-09 NOTE — Progress Notes (Unsigned)
@Patient  ID: Audrey Goodman, female    DOB: Jul 06, 1970, 53 y.o.   MRN: [id number]  Chief Complaint  Patient presents with   Follow-up    Referring provider: [name], NP  HPI: 53 yo female seen for sleep consult 07/08/22 for snoring, restless sleep, and daytime sleepiness  TEST/EVENTS :   07/09/23 Sleep consult  Patient presents for a sleep consult today. Kindly referred by Primary care provider [name], NP. She complains of snoring, restless sleep and daytime sleepiness. Feels tired all the time no matter how much sleep she gets. Family members have noticed apneic events. Goes to bed 8-10pm, take up to 1 hr to go to sleep  and gets up at 6-8 am. Says she had a sleep study done years ago but never got the results.  Epworth score is 7 out of 24 gets sleepy if she sits down to watch TV, rest, in the afternoon hours and after eating lunch.  She does not use any sleep aids.  Has no removable dental work.  No history of congestive heart failure or stroke.  Patient says she does typically nap every day as soon as she gets home from work sometimes up to 2 to 3 hours.  Patient has very loud snoring and teeth grinding.  No symptoms suspicious for cataplexy or sleep paralysis.  She does have attention deficit disorder and is on Adderall. Patient's current weight is 262 pounds with a BMI at 43   PMH significant for hypertension, diabetes, hyperlipidemia, chronic allergies, stage I chronic kidney disease, anxiety, depression, attention deficit disorder, PCOS  Allergies  Allergen Reactions   Lisinopril Swelling    Swelling of the left side of tongue   Lipitor [Atorvastatin] Other (See Comments)    Myalgia/foot cramping    Immunization History  Administered Date(s) Administered   Covid-19 Iv Non-us Vaccine (Bibp, Sinopharm) 02/03/2023   Influenza Whole 02/18/2008   Influenza,inj,Quad PF,6+ Mos 02/23/2015, 02/26/2016, 03/08/2017, 02/03/2018, 02/06/2019, 01/13/2020,  01/23/2021   Influenza-Unspecified 02/09/2014, 02/03/2023   Moderna SARS-COV2 Booster Vaccination 02/03/2023   PFIZER(Purple Top)SARS-COV-2 Vaccination 08/14/2019, 09/04/2019, 02/20/2020   Pneumococcal Conjugate-13 02/13/2014   Pneumococcal Polysaccharide-23 03/21/2020   Pneumococcal-Unspecified 02/09/2014, 03/21/2020   Td 10/26/2004   Tdap 07/13/2015   Unspecified SARS-COV-2 Vaccination 08/14/2019, 09/04/2019   Zoster Recombinant(Shingrix) 11/29/2021, 02/23/2022   Zoster, Live 11/30/2021    Past Medical History:  Diagnosis Date   Anxiety    Arthritis    left shoulder   Back pain    Chronic kidney disease    stage 1   Depression    Diabetes mellitus without complication (HCC)    type 2- on meds   Diverticulitis    High cholesterol    History of chicken pox    Hyperlipidemia    on meds   Hypertension    on meds   Joint pain    Lactose intolerance    PCOS (polycystic ovarian syndrome)    Seasonal allergies    Shingles    Sleep apnea    does NOT use CPAP   SVD (spontaneous vaginal delivery) 1995   x 1   Type 2 diabetes mellitus, uncontrolled 07/30/2006   Qualifier: Diagnosis of  By: Lanier Prude  MD, Cathrine Muster      Tobacco History: Social History   Tobacco Use  Smoking Status Never  Smokeless Tobacco Never   Counseling given: Not Answered   Outpatient Medications Prior to Visit  Medication Sig Dispense Refill   amLODipine (NORVASC) 5 MG tablet  TAKE 1 TABLET BY MOUTH EVERY DAY 90 tablet 0   amphetamine-dextroamphetamine (ADDERALL XR) 30 MG 24 hr capsule Take 1 capsule (30 mg total) by mouth every morning. 30 capsule 0   cetirizine (ZYRTEC) 10 MG tablet Take 10 mg by mouth daily.     Continuous Glucose Sensor (DEXCOM G7 SENSOR) MISC 1 Device by Does not apply route continuous. 9 each 3   dapagliflozin propanediol (FARXIGA) 10 MG TABS tablet Take 1 tablet (10 mg total) by mouth daily. 90 tablet 1   FLUoxetine (PROZAC) 20 MG capsule Take 1 capsule (20 mg total) by  mouth daily. 90 capsule 1   fluticasone (FLONASE) 50 MCG/ACT nasal spray Place 2 sprays into both nostrils daily. 48 g 1   insulin aspart (NOVOLOG FLEXPEN) 100 UNIT/ML FlexPen Inject 12-15 Units into the skin 3 (three) times daily with meals. INJECT 12-15 UNITS (DEPENDING ON MEAL SIZE) UNDER THE SKIN 3 TIMES A DAYINJECT 12-15 UNITS (DEPENDING ON MEAL SIZE) UNDER THE SKIN 3 TIMES A DAY 45 mL 3   insulin glargine (LANTUS SOLOSTAR) 100 UNIT/ML Solostar Pen Inject 30 Units into the skin daily. 30 mL 3   Insulin Pen Needle 32G X 4 MM MISC Use to inject insulin 4 times daily 400 each 3   metFORMIN (GLUCOPHAGE-XR) 750 MG 24 hr tablet TAKE 1 TABLET BY MOUTH 2 TIMES DAILY. 180 tablet 3   Multiple Vitamin (MULTIVITAMIN PO) Take 1 tablet by mouth daily at 6 (six) AM.     rosuvastatin (CRESTOR) 10 MG tablet Take 1 tablet (10 mg total) by mouth daily. 90 tablet 1   Semaglutide-Weight Management 2.4 MG/0.75ML SOAJ Inject 2.4 mg into the skin once a week. 9 mL 3   spironolactone (ALDACTONE) 50 MG tablet TAKE 1 TABLET BY MOUTH TWICE A DAY 180 tablet 3   No facility-administered medications prior to visit.     Review of Systems:   Constitutional:   No  weight loss, night sweats,  Fevers, chills, +fatigue, or  lassitude.  HEENT:   No headaches,  Difficulty swallowing,  Tooth/dental problems, or  Sore throat,                No sneezing, itching, ear ache, nasal congestion, post nasal drip,   CV:  No chest pain,  Orthopnea, PND, swelling in lower extremities, anasarca, dizziness, palpitations, syncope.   GI  No heartburn, indigestion, abdominal pain, nausea, vomiting, diarrhea, change in bowel habits, loss of appetite, bloody stools.   Resp: No shortness of breath with exertion or at rest.  No excess mucus, no productive cough,  No non-productive cough,  No coughing up of blood.  No change in color of mucus.  No wheezing.  No chest wall deformity  Skin: no rash or lesions.  GU: no dysuria, change in color  of urine, no urgency or frequency.  No flank pain, no hematuria   MS:  No joint pain or swelling.  No decreased range of motion.  No back pain.    Physical Exam  BP 121/81 (BP Location: Left Arm, Patient Position: Sitting, Cuff Size: Large)   Pulse 100   Temp 99.4 F (37.4 C) (Oral)   Ht 5\' 5"  (1.651 m)   Wt 262 lb (118.8 kg)   SpO2 96%   BMI 43.60 kg/m   GEN: A/Ox3; pleasant , NAD, well nourished    HEENT:  [place]/AT,  NOSE-clear, THROAT-clear, no lesions, no postnasal drip or exudate noted.  Class II-III MP airway  NECK:  Supple w/ fair ROM; no JVD; normal carotid impulses w/o bruits; no thyromegaly or nodules palpated; no lymphadenopathy.    RESP  Clear  P & A; w/o, wheezes/ rales/ or rhonchi. no accessory muscle use, no dullness to percussion  CARD:  RRR, no m/r/g, no peripheral edema, pulses intact, no cyanosis or clubbing.  GI:   Soft & nt; nml bowel sounds; no organomegaly or masses detected.   Musco: Warm bil, no deformities or joint swelling noted.   Neuro: alert, no focal deficits noted.    Skin: Warm, no lesions or rashes    Lab Results:  CBC    Component Value Date/Time   WBC 8.0 03/20/2023 0000   WBC 11.0 (H) 03/08/2017 1545   RBC 4.883 03/20/2023 0000   HGB 14.2 03/20/2023 0000   HGB 14.7 11/29/2020 0955   HCT 43 03/20/2023 0000   HCT 46.4 11/29/2020 0955   PLT 345 03/20/2023 0000   PLT 338 11/29/2020 0955   MCV 90 11/29/2020 0955   MCH 28.4 11/29/2020 0955   MCH 27.9 03/08/2017 1545   MCHC 31.7 11/29/2020 0955   MCHC 32.9 03/08/2017 1545   RDW 12.5 11/29/2020 0955   LYMPHSABS 2.7 11/29/2020 0955   MONOABS 0.5 07/13/2015 0814   EOSABS 0.3 11/29/2020 0955   BASOSABS 0.1 11/29/2020 0955    BMET    Component Value Date/Time   NA 136 (A) 03/20/2023 0000   K 5.1 03/20/2023 0000   CL 101 03/20/2023 0000   CO2 26 02/20/2023 1014   GLUCOSE 153 (H) 02/20/2023 1014   BUN 16 03/20/2023 0000   CREATININE 0.91 02/20/2023 1014   CREATININE 0.86  03/21/2020 0834   CALCIUM 9.9 03/20/2023 0000   GFRNONAA 70 10/09/2017 0838   GFRAA 81 10/09/2017 0838    BNP No results found for: "BNP"  ProBNP No results found for: "PROBNP"  Imaging: XR C-ARM NO REPORT Result Date: 06/18/2023 Please see Notes tab for imaging impression.  Epidural Steroid injection Result Date: 06/18/2023 Tyrell Antonio, MD     06/27/2023  9:22 AM Lumbosacral Transforaminal Epidural Steroid Injection - Sub-Pedicular Approach with Fluoroscopic Guidance Patient: ANGELEA PENNY     Date of Birth: July 22, 1970 MRN: [id number] PCP: [name], NP     Visit Date: 06/18/2023  Universal Protocol:   Date/Time: 06/18/2023 Consent Given By: the patient Position: PRONE Additional Comments: Vital signs were monitored before and after the procedure. Patient was prepped and draped in the usual sterile fashion. The correct patient, procedure, and site was verified. Injection Procedure Details: Procedure diagnoses: Lumbar radiculopathy [M54.16]  Meds Administered: Meds ordered this encounter Medications  methylPREDNISolone acetate (DEPO-MEDROL) injection 40 mg Laterality: Left Location/Site: L4 Needle:6.0 in., 22 ga.  Short bevel or Quincke spinal needle Needle Placement: Transforaminal Findings:   -Comments: Excellent flow of contrast along the nerve, nerve root and into the epidural space. Procedure Details: After squaring off the end-plates to get a true AP view, the C-arm was positioned so that an oblique view of the foramen as noted above was visualized. The target area is just inferior to the "nose of the scotty dog" or sub pedicular. The soft tissues overlying this structure were infiltrated with 2-3 ml. of 1% Lidocaine without Epinephrine. The spinal needle was inserted toward the target using a "trajectory" view along the fluoroscope beam.  Under AP and lateral visualization, the needle was advanced so it did not puncture dura and was located close the 6 O'Clock position of the  pedical  in AP tracterory. Biplanar projections were used to confirm position. Aspiration was confirmed to be negative for CSF and/or blood. A 1-2 ml. volume of Isovue-250 was injected and flow of contrast was noted at each level. Radiographs were obtained for documentation purposes. After attaining the desired flow of contrast documented above, a 0.5 to 1.0 ml test dose of 0.25% Marcaine was injected into each respective transforaminal space.  The patient was observed for 90 seconds post injection.  After no sensory deficits were reported, and normal lower extremity motor function was noted,   the above injectate was administered so that equal amounts of the injectate were placed at each foramen (level) into the transforaminal epidural space. Additional Comments: The patient tolerated the procedure well Dressing: 2 x 2 sterile gauze and Band-Aid  Post-procedure details: Patient was observed during the procedure. Post-procedure instructions were reviewed. Patient left the clinic in stable condition.    methylPREDNISolone acetate (DEPO-MEDROL) injection 40 mg     Date Action Dose Route User   06/18/2023 1258 Given 40 mg Other Tyrell Antonio, MD           No data to display          No results found for: "NITRICOXIDE"      Assessment & Plan:   No problem-specific Assessment & Plan notes found for this encounter.     Rubye Oaks, NP 07/09/2023

## 2023-07-10 ENCOUNTER — Other Ambulatory Visit (HOSPITAL_COMMUNITY): Payer: Self-pay

## 2023-07-10 ENCOUNTER — Encounter: Payer: Self-pay | Admitting: Family

## 2023-07-10 DIAGNOSIS — F902 Attention-deficit hyperactivity disorder, combined type: Secondary | ICD-10-CM

## 2023-07-10 DIAGNOSIS — R0683 Snoring: Secondary | ICD-10-CM | POA: Insufficient documentation

## 2023-07-10 MED ORDER — AMPHETAMINE-DEXTROAMPHET ER 30 MG PO CP24: 30.0000 mg | ORAL_CAPSULE | ORAL | 0 refills | Status: DC

## 2023-07-10 NOTE — Assessment & Plan Note (Signed)
Snoring, daytime sleepiness, restless sleep all concerning for underlying sleep apnea.  Patient has multiple medical problems including hypertension, diabetes and hyperlipidemia.  Will set patient up for home sleep study.  Patient education given on sleep apnea  - discussed how weight can impact sleep and risk for sleep disordered breathing - discussed options to assist with weight loss: combination of diet modification, cardiovascular and strength training exercises   - had an extensive discussion regarding the adverse health consequences related to untreated sleep disordered breathing - specifically discussed the risks for hypertension, coronary artery disease, cardiac dysrhythmias, cerebrovascular disease, and diabetes - lifestyle modification discussed   - discussed how sleep disruption can increase risk of accidents, particularly when driving - safe driving practices were discussed   Plan  . Patient Instructions  Set up for home sleep study  Healthy sleep regimen  Do not drive if sleepy  Follow up in 6 weeks follow up -Virtual Friday clinic

## 2023-07-10 NOTE — Assessment & Plan Note (Signed)
Health weight loss discussed

## 2023-07-14 ENCOUNTER — Other Ambulatory Visit: Payer: Self-pay | Admitting: Family

## 2023-07-15 ENCOUNTER — Encounter: Payer: Self-pay | Admitting: Family

## 2023-07-15 MED ORDER — AMLODIPINE BESYLATE 5 MG PO TABS
5.0000 mg | ORAL_TABLET | Freq: Every day | ORAL | 0 refills | Status: DC
Start: 2023-07-15 — End: 2023-10-22

## 2023-07-21 ENCOUNTER — Encounter: Payer: Self-pay | Admitting: Sports Medicine

## 2023-07-22 ENCOUNTER — Encounter: Payer: Self-pay | Admitting: Sports Medicine

## 2023-07-22 ENCOUNTER — Encounter

## 2023-07-22 ENCOUNTER — Telehealth: Payer: Self-pay | Admitting: Sports Medicine

## 2023-07-22 DIAGNOSIS — R0683 Snoring: Secondary | ICD-10-CM

## 2023-07-22 NOTE — Telephone Encounter (Signed)
 Patient is requesting intermittent leave for I-2 hours week for 1 day a week for therapy & MD appointments. Please advise if okay. Thank you!

## 2023-07-23 ENCOUNTER — Ambulatory Visit: Payer: 59 | Admitting: Internal Medicine

## 2023-07-26 ENCOUNTER — Ambulatory Visit (HOSPITAL_BASED_OUTPATIENT_CLINIC_OR_DEPARTMENT_OTHER): Payer: 59 | Admitting: Physical Therapy

## 2023-07-26 NOTE — Telephone Encounter (Signed)
 IC patient. Advised request is approved by Dr. Shon Baton. Advised the patient that we only received partial fax from [employer]. Advised pt that reached out to [employer] and requested to refax [name] the form and I have not received anything. I urged pt to reach out to [employer] and try to get them to fax [name] the complete form. Will complete when received.

## 2023-07-31 ENCOUNTER — Other Ambulatory Visit: Payer: Self-pay | Admitting: Sports Medicine

## 2023-07-31 MED ORDER — CELECOXIB 100 MG PO CAPS: 100.0000 mg | ORAL_CAPSULE | Freq: Two times a day (BID) | ORAL | 0 refills | Status: DC

## 2023-08-01 ENCOUNTER — Encounter: Payer: Self-pay | Admitting: Internal Medicine

## 2023-08-04 ENCOUNTER — Telehealth: Payer: Self-pay | Admitting: Pulmonary Disease

## 2023-08-04 DIAGNOSIS — Z0389 Encounter for observation for other suspected diseases and conditions ruled out: Secondary | ICD-10-CM | POA: Diagnosis not present

## 2023-08-04 NOTE — Telephone Encounter (Signed)
 Call patient  Sleep study result  Date of study: 07/22/2023  Impression: Study is negative for significant sleep disordered breathing with an AHI of 3.8, mild oxygen desaturations with oxygen nadir of 86%  Recommendation:  Consider an in lab study if there remains significant concern for sleep disordered breathing, a negative home sleep study may be falsely negative if the pretest probability for sleep disordered breathing is high  Current study suggests absence of moderate to severe sleep disordered breathing.  Encourage weight loss measures  Clinical follow-up of symptoms

## 2023-08-06 NOTE — Telephone Encounter (Signed)
Message sent to patient with results

## 2023-08-07 NOTE — Telephone Encounter (Signed)
 Patient has f/u with Rubye Oaks NP on 3/28.  Nothing further needed.

## 2023-08-11 ENCOUNTER — Other Ambulatory Visit: Payer: Self-pay | Admitting: Family

## 2023-08-11 DIAGNOSIS — F902 Attention-deficit hyperactivity disorder, combined type: Secondary | ICD-10-CM

## 2023-08-12 ENCOUNTER — Telehealth (HOSPITAL_BASED_OUTPATIENT_CLINIC_OR_DEPARTMENT_OTHER): Payer: Self-pay | Admitting: Physical Therapy

## 2023-08-12 MED ORDER — AMPHETAMINE-DEXTROAMPHET ER 30 MG PO CP24: 30.0000 mg | ORAL_CAPSULE | ORAL | 0 refills | Status: DC

## 2023-08-12 NOTE — Telephone Encounter (Signed)
 Called and spoke to patient to remind patient of upcoming physical therapy evaluation appointment. Pt confirmed appt and will be in attendance.

## 2023-08-12 NOTE — Telephone Encounter (Signed)
 Requesting: Adderall XR 30 mg  Contract: 02/20/2023 UDS: 02/20/2023 Last Visit: 02/20/2023 Next Visit: 08/23/2023 Last Refill: 07/10/2023  Please Advise

## 2023-08-13 NOTE — Therapy (Signed)
 OUTPATIENT PHYSICAL THERAPY THORACOLUMBAR EVALUATION   Patient Name: Audrey Goodman MRN: [id number] DOB:1970-12-30, 53 y.o., female Today's Date: 08/14/2023  END OF SESSION:  PT End of Session - 08/14/23 1055     Visit Number 1    Number of Visits 16    Date for PT Re-Evaluation 10/11/23    Authorization Type uhc    PT Start Time 0810    PT Stop Time 0845    PT Time Calculation (min) 35 min    Activity Tolerance Patient tolerated treatment well    Behavior During Therapy WFL for tasks assessed/performed             Past Medical History:  Diagnosis Date   Anxiety    Arthritis    left shoulder   Back pain    Chronic kidney disease    stage 1   Depression    Diabetes mellitus without complication (HCC)    type 2- on meds   Diverticulitis    High cholesterol    History of chicken pox    Hyperlipidemia    on meds   Hypertension    on meds   Joint pain    Lactose intolerance    PCOS (polycystic ovarian syndrome)    Seasonal allergies    Shingles    Sleep apnea    does NOT use CPAP   SVD (spontaneous vaginal delivery) 1995   x 1   Type 2 diabetes mellitus, uncontrolled 07/30/2006   Qualifier: Diagnosis of  By: Lanier Prude  MD, Cathrine Muster     Past Surgical History:  Procedure Laterality Date   ANKLE FRACTURE SURGERY  05/28/1989   right   CHOLECYSTECTOMY  05/28/2006   COLONOSCOPY  1989   due to blood in stool and diverticulitis per pt   HYSTEROSCOPY N/A 09/13/2014   Procedure: HYSTEROSCOPY with IUD removal;  Surgeon: Mitchel Honour, DO;  Location: [facility];  Service: Gynecology;  Laterality: N/A;  need longest speculum and instruments that aresavailable due to patient's habitus (BMI ~60)   Patient Active Problem List   Diagnosis Date Noted   Snoring 07/10/2023   Insomnia 02/20/2023   Attention deficit hyperactivity disorder, combined type, moderate 03/01/2022   Impacted cerumen of both ears 01/23/2021   Left Achilles tendinitis 06/19/2017   Depression 07/13/2015    Preventative health care 06/23/2014   PCOS (polycystic ovarian syndrome) 09/10/2013   CHOLELITHIASIS 02/20/2008   Hyperlipidemia 08/06/2006   Uncontrolled diabetes mellitus with hyperglycemia (HCC) 07/30/2006   HYPERTENSION, BENIGN ESSENTIAL 07/30/2006   MORBID OBESITY 07/25/2006    PCP: [name] NP  REFERRING PROVIDER:   Madelyn Brunner, DO    REFERRING DIAG:  [phone] (ICD-10-CM) - Foraminal stenosis of lumbar region  M54.42,G89.29 (ICD-10-CM) - Chronic left-sided low back pain with left-sided sciatica    Rationale for Evaluation and Treatment: Rehabilitation  THERAPY DIAG:  Other low back pain  Muscle weakness (generalized)  Difficulty in walking, not elsewhere classified  ONSET DATE: April 2024  SUBJECTIVE:  SUBJECTIVE STATEMENT: I was told I had left hip OA but minimal, shouldn't be causing as much pain as I am having.  Had shot in L3 which pulled my pain down (Oct 2024).  Had another in L4 without relief (Jan 2025).  Pain is daily, occasionally waking at night. Started when I began walking more a little over 1 year ago at [employer].  Have been up a down since  PERTINENT HISTORY:  Left shoulder OA, chronic kidney disease, type 2 DM, HLD, right ankle fracture '91, morbid obesity   PAIN:  Are you having pain? Yes: NPRS scale: current 0/10; worst 4/10 Pain location: lumbar spine with radicular pain into left buttock and anterior left thigh Pain description: ache intermittent Aggravating factors: walking few minutes; sitting > 1 hour Relieving factors: sitting, standing  PRECAUTIONS: None  RED FLAGS: None   WEIGHT BEARING RESTRICTIONS: No  FALLS:  Has patient fallen in last 6 months? No  LIVING ENVIRONMENT: Lives with: lives alone Lives in: House/apartment Stairs:   Avoids stairs Has following equipment at home: Single point cane  OCCUPATION: Claims specialist, works from home on a computer   PLOF: Independent  PATIENT GOALS: get looser in hips, decrease pain in back, going back to [place] in Dec  NEXT MD VISIT: as needed  OBJECTIVE:  Note: Objective measures were completed at Evaluation unless otherwise noted.  DIAGNOSTIC FINDINGS:  None in chart  PATIENT SURVEYS:  Modified Oswestry 17/45=37%   COGNITION: Overall cognitive status: Within functional limits for tasks assessed     SENSATION: WFL  MUSCLE LENGTH: Hamstrings: WFL tested in sitting    POSTURE: rounded shoulders and increased lumbar lordosis  PALPATION: TTP slight left IT band, lumbar paraspinals  LUMBAR ROM:   AROM eval  Flexion full  Extension Full P!  Right lateral flexion Full P!  Left lateral flexion full  Right rotation   Left rotation    (Blank rows = not tested)  LOWER EXTREMITY ROM:     wfl  LOWER EXTREMITY MMT:    MMT Right eval Left eval  Hip flexion 50.6 53.9  Hip extension    Hip abduction 33.2 37.6  Hip adduction    Hip internal rotation    Hip external rotation    Knee flexion    Knee extension    Ankle dorsiflexion    Ankle plantarflexion    Ankle inversion    Ankle eversion     (Blank rows = not tested)  LUMBAR SPECIAL TESTS:  Straight leg raise test: Negative and Slump test: Negative  FUNCTIONAL TESTS:  5 times sit to stand: 21.88 Timed up and go (TUG): 15.42 4 stage balance: 1&2 passed; tandem x 10s; SLS x2s  GAIT: Distance walked: 400 ft Assistive device utilized: Single point cane Level of assistance: Complete Independence Comments: slight bilateral hip trendelenburg  TREATMENT Eval Self care: Use of cane in and out of home environment. Instruction on posture with amb and allowing rest periods for longer toleration of activity.  PATIENT EDUCATION:  Education details: Discussed eval findings, rehab rationale, aquatic program progression/POC and pools in area. Patient is in agreement  Person educated: Patient Education method: Explanation Education comprehension: verbalized understanding  HOME EXERCISE PROGRAM: tba  ASSESSMENT:  CLINICAL IMPRESSION: Patient is a 53 y.o. f who was seen today for physical therapy evaluation and treatment for lumbar stenosis/LBP. She does have intermittent radicular pain into her lle to knee with extended walking. Reports reduction of pain after steroid injection a few month ago.  Slump test neg.  Hip flex and abduction strength is fairly good although resistance increases lbp, posterior core strength lacking.  Lumbar ROM also good but experiences pain in LB at end ranges. Her biggest complaint is ability to tolerate amb as pain begins immediately once up and gait initiated and is limited after 5 mins. She does use a cane out of home.  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 She is going to [facility] in December and wants to improve her walking as much as able.  She will benefit from skilled physical therapy intervention beginning in aquatics to initiate reduction and improved management of pain and strengthening/ stretching post core using the properties of water.  Will transition to land based as tolerated.  OBJECTIVE IMPAIRMENTS: Abnormal gait, decreased activity tolerance, decreased balance, decreased endurance, difficulty walking, decreased strength, obesity, and pain.   ACTIVITY LIMITATIONS: carrying, lifting, bending, sitting, standing, stairs, transfers, and locomotion level  PARTICIPATION LIMITATIONS: meal prep, cleaning, laundry, shopping, community activity, and yard work  PERSONAL FACTORS: Fitness and 1-2 comorbidities: see problem list  are also affecting patient's functional outcome.   REHAB POTENTIAL: Good  CLINICAL DECISION MAKING:  Evolving/moderate complexity  EVALUATION COMPLEXITY: Moderate   GOALS: Goals reviewed with patient? Yes  SHORT TERM GOALS: Target date: 09/05/23  Pt will tolerate full aquatic sessions consistently without increase in pain and with improving function to demonstrate good toleration and effectiveness of intervention.  Baseline: Goal status: INITIAL  2.  Pt will report feeling "looser" in hips and back with stretching submerged (personal goal) Baseline: tight Goal status: INITIAL  3.  Pt will improve on Tug test to <or=13.5 (community dwelling adults) to demonstrate improvement in lower extremity function, mobility and decreased fall risk. Baseline: 15.42 Goal status: INITIAL  LONG TERM GOALS: Target date: 10/11/23  Pt to improve on ODI to below 20% to demonstrate statistically significant Improvement in function. Baseline: 17/45=37% Goal status: INITIAL  2.  Pt will report  a 75% reduction in LBP Baseline:  Goal status: INITIAL  3.  Pt will improve on 5 X STS test to <or= 17s ([facility]) to demonstrate improving functional lower extremity strength, transitional movements, and balance Baseline: 21.88 Goal status: INITIAL  4.  Pt will perform full lumbar ROM without pain Baseline: see chart Goal status: INITIAL  5.  Pt will report amb community distances without limitation to pain (up to 2000 ft) with AD as needed Baseline:  Goal status: INITIAL    PLAN:  PT FREQUENCY: 1-2x/week  PT DURATION: 8 weeks  PLANNED INTERVENTIONS: 97164- PT Re-evaluation, 97110-Therapeutic exercises, 97530- Therapeutic activity, 97112- Neuromuscular re-education, 97535- Self Care, 40981- Manual therapy, [phone]- Gait training, [phone]- Orthotic Fit/training, [phone]- Aquatic Therapy, [phone]- Electrical stimulation (unattended), [phone]- Ionotophoresis 4mg /ml Dexamethasone, Patient/Family education, Balance training, Stair training, Taping, Dry Needling, Joint mobilization, DME instructions, Cryotherapy, and  Moist heat.  PLAN FOR NEXT SESSION: aquatics: glut and posterior core strengthening; gait. Balance stretching LE and lumbar spine   Corrie Dandy Tomma Lightning)  Janie Capp MPT 08/14/23 10:57 AM [facility] [address] [place], [place], [address] Phone: [phone]   Fax:  [phone]

## 2023-08-14 ENCOUNTER — Other Ambulatory Visit: Payer: Self-pay

## 2023-08-14 ENCOUNTER — Encounter (HOSPITAL_BASED_OUTPATIENT_CLINIC_OR_DEPARTMENT_OTHER): Payer: Self-pay | Admitting: Physical Therapy

## 2023-08-14 ENCOUNTER — Ambulatory Visit (HOSPITAL_BASED_OUTPATIENT_CLINIC_OR_DEPARTMENT_OTHER): Payer: 59 | Attending: Sports Medicine | Admitting: Physical Therapy

## 2023-08-14 DIAGNOSIS — M48061 Spinal stenosis, lumbar region without neurogenic claudication: Secondary | ICD-10-CM | POA: Insufficient documentation

## 2023-08-14 DIAGNOSIS — M6281 Muscle weakness (generalized): Secondary | ICD-10-CM | POA: Diagnosis not present

## 2023-08-14 DIAGNOSIS — G8929 Other chronic pain: Secondary | ICD-10-CM | POA: Diagnosis present

## 2023-08-14 DIAGNOSIS — M5442 Lumbago with sciatica, left side: Secondary | ICD-10-CM | POA: Insufficient documentation

## 2023-08-14 DIAGNOSIS — R262 Difficulty in walking, not elsewhere classified: Secondary | ICD-10-CM | POA: Diagnosis not present

## 2023-08-14 DIAGNOSIS — M5459 Other low back pain: Secondary | ICD-10-CM

## 2023-08-14 DIAGNOSIS — E1165 Type 2 diabetes mellitus with hyperglycemia: Secondary | ICD-10-CM

## 2023-08-14 DIAGNOSIS — I10 Essential (primary) hypertension: Secondary | ICD-10-CM

## 2023-08-14 MED ORDER — SPIRONOLACTONE 50 MG PO TABS: 50.0000 mg | ORAL_TABLET | Freq: Two times a day (BID) | ORAL | 3 refills | Status: AC

## 2023-08-18 ENCOUNTER — Encounter: Payer: Self-pay | Admitting: Sports Medicine

## 2023-08-19 ENCOUNTER — Encounter (HOSPITAL_BASED_OUTPATIENT_CLINIC_OR_DEPARTMENT_OTHER): Payer: Self-pay | Admitting: Physical Therapy

## 2023-08-19 ENCOUNTER — Ambulatory Visit (HOSPITAL_BASED_OUTPATIENT_CLINIC_OR_DEPARTMENT_OTHER): Admitting: Physical Therapy

## 2023-08-19 DIAGNOSIS — M6281 Muscle weakness (generalized): Secondary | ICD-10-CM

## 2023-08-19 DIAGNOSIS — M5459 Other low back pain: Secondary | ICD-10-CM

## 2023-08-19 DIAGNOSIS — R262 Difficulty in walking, not elsewhere classified: Secondary | ICD-10-CM

## 2023-08-19 DIAGNOSIS — M48061 Spinal stenosis, lumbar region without neurogenic claudication: Secondary | ICD-10-CM | POA: Diagnosis not present

## 2023-08-19 NOTE — Therapy (Signed)
 OUTPATIENT PHYSICAL THERAPY THORACOLUMBAR EVALUATION   Patient Name: Audrey Goodman MRN: [id number] DOB:01/06/1971, 53 y.o., female Today's Date: 08/19/2023  END OF SESSION:  PT End of Session - 08/19/23 1131     Visit Number 2    Number of Visits 16    Date for PT Re-Evaluation 10/11/23    Authorization Type uhc    PT Start Time 1101    PT Stop Time 1140    PT Time Calculation (min) 39 min    Activity Tolerance Patient tolerated treatment well    Behavior During Therapy WFL for tasks assessed/performed              Past Medical History:  Diagnosis Date   Anxiety    Arthritis    left shoulder   Back pain    Chronic kidney disease    stage 1   Depression    Diabetes mellitus without complication (HCC)    type 2- on meds   Diverticulitis    High cholesterol    History of chicken pox    Hyperlipidemia    on meds   Hypertension    on meds   Joint pain    Lactose intolerance    PCOS (polycystic ovarian syndrome)    Seasonal allergies    Shingles    Sleep apnea    does NOT use CPAP   SVD (spontaneous vaginal delivery) 1995   x 1   Type 2 diabetes mellitus, uncontrolled 07/30/2006   Qualifier: Diagnosis of  By: Lanier Prude  MD, Cathrine Muster     Past Surgical History:  Procedure Laterality Date   ANKLE FRACTURE SURGERY  05/28/1989   right   CHOLECYSTECTOMY  05/28/2006   COLONOSCOPY  1989   due to blood in stool and diverticulitis per pt   HYSTEROSCOPY N/A 09/13/2014   Procedure: HYSTEROSCOPY with IUD removal;  Surgeon: Mitchel Honour, DO;  Location: [facility];  Service: Gynecology;  Laterality: N/A;  need longest speculum and instruments that aresavailable due to patient's habitus (BMI ~60)   Patient Active Problem List   Diagnosis Date Noted   Snoring 07/10/2023   Insomnia 02/20/2023   Attention deficit hyperactivity disorder, combined type, moderate 03/01/2022   Impacted cerumen of both ears 01/23/2021   Left Achilles tendinitis 06/19/2017   Depression  07/13/2015   Preventative health care 06/23/2014   PCOS (polycystic ovarian syndrome) 09/10/2013   CHOLELITHIASIS 02/20/2008   Hyperlipidemia 08/06/2006   Uncontrolled diabetes mellitus with hyperglycemia (HCC) 07/30/2006   HYPERTENSION, BENIGN ESSENTIAL 07/30/2006   MORBID OBESITY 07/25/2006    PCP: [name] NP  REFERRING PROVIDER:   Madelyn Brunner, DO    REFERRING DIAG:  [phone] (ICD-10-CM) - Foraminal stenosis of lumbar region  M54.42,G89.29 (ICD-10-CM) - Chronic left-sided low back pain with left-sided sciatica    Rationale for Evaluation and Treatment: Rehabilitation  THERAPY DIAG:  Other low back pain  Muscle weakness (generalized)  Difficulty in walking, not elsewhere classified  ONSET DATE: April 2024  SUBJECTIVE:  SUBJECTIVE STATEMENT: "Moved furniture this week end and am a little sore"   Initial subjective I was told I had left hip OA but minimal, shouldn't be causing as much pain as I am having.  Had shot in L3 which pulled my pain down (Oct 2024).  Had another in L4 without relief (Jan 2025).  Pain is daily, occasionally waking at night. Started when I began walking more a little over 1 year ago at [employer].  Have been up a down since  PERTINENT HISTORY:  Left shoulder OA, chronic kidney disease, type 2 DM, HLD, right ankle fracture '91, morbid obesity   PAIN:  Are you having pain? Yes: NPRS scale: current 4/10; worst 4/10 Pain location: lumbar spine with radicular pain into left buttock and anterior left thigh Pain description: ache intermittent Aggravating factors: walking few minutes; sitting > 1 hour Relieving factors: sitting, standing  PRECAUTIONS: None  RED FLAGS: None   WEIGHT BEARING RESTRICTIONS: No  FALLS:  Has patient fallen in last 6 months?  No  LIVING ENVIRONMENT: Lives with: lives alone Lives in: House/apartment Stairs:  Avoids stairs Has following equipment at home: Single point cane  OCCUPATION: Claims specialist, works from home on a computer   PLOF: Independent  PATIENT GOALS: get looser in hips, decrease pain in back, going back to [place] in Dec  NEXT MD VISIT: as needed  OBJECTIVE:  Note: Objective measures were completed at Evaluation unless otherwise noted.  DIAGNOSTIC FINDINGS:  None in chart  PATIENT SURVEYS:  Modified Oswestry 17/45=37%   COGNITION: Overall cognitive status: Within functional limits for tasks assessed     SENSATION: WFL  MUSCLE LENGTH: Hamstrings: WFL tested in sitting    POSTURE: rounded shoulders and increased lumbar lordosis  PALPATION: TTP slight left IT band, lumbar paraspinals  LUMBAR ROM:   AROM eval  Flexion full  Extension Full P!  Right lateral flexion Full P!  Left lateral flexion full  Right rotation   Left rotation    (Blank rows = not tested)  LOWER EXTREMITY ROM:     wfl  LOWER EXTREMITY MMT:    MMT Right eval Left eval  Hip flexion 50.6 53.9  Hip extension    Hip abduction 33.2 37.6  Hip adduction    Hip internal rotation    Hip external rotation    Knee flexion    Knee extension    Ankle dorsiflexion    Ankle plantarflexion    Ankle inversion    Ankle eversion     (Blank rows = not tested)  LUMBAR SPECIAL TESTS:  Straight leg raise test: Negative and Slump test: Negative  FUNCTIONAL TESTS:  5 times sit to stand: 21.88 Timed up and go (TUG): 15.42 4 stage balance: 1&2 passed; tandem x 10s; SLS x2s  GAIT: Distance walked: 400 ft Assistive device utilized: Single point cane Level of assistance: Complete Independence Comments: slight bilateral hip trendelenburg  TREATMENT [facility] Adult PT Treatment:                                                DATE: 08/19/23 Pt seen for aquatic therapy today.  Treatment took place in water  3.5-4.75 ft in depth at the [facility] pool. Temp of water was 91.  Pt entered/exited the pool via stairs using step to pattern with hand rail.  *Intro to setting *  walking forward, back and side stepping in 3.6 ft with ue support of barbell ->unsupported *L stretch x 3 *farmers carry using RBHB forward and back x 2 widths.  Instruction on abd bracing. *decompression position with noodle wrapped anteriorly across chest ue support on corner wall: cycling; hip add/abd; flex/ext   - cycling across pool x 2 widths  -hanging for pain relief *3 way hamstring stretch *1/2 noodle->full noodle press 3.7-8 ft x 5 reps in wide stance then staggered stances.   Pt requires the buoyancy and hydrostatic pressure of water for support, and to offload joints by unweighting joint load by at least 50 % in navel deep water and by at least 75-80% in chest to neck deep water.  Viscosity of the water is needed for resistance of strengthening. Water current perturbations provides challenge to standing balance requiring increased core activation.                                                                                                                    PATIENT EDUCATION:  Education details: Discussed eval findings, rehab rationale, aquatic program progression/POC and pools in area. Patient is in agreement  Person educated: Patient Education method: Explanation Education comprehension: verbalized understanding  HOME EXERCISE PROGRAM: tba  ASSESSMENT:  CLINICAL IMPRESSION: Pt demonstrates safety and independence in aquatic setting with therapist instructing from deck. She has slight apprehension initially which improves as session progresses eventually confident moving throughout all depths.  Pt is directed through various movement patterns and trials in both sitting and standing positions. She reports some hip discomfort with side stepping which is reduced with cues for shorter step lengh.   Goals are ongoing.     Initial Impression Patient is a 53 y.o. f who was seen today for physical therapy evaluation and treatment for lumbar stenosis/LBP. She does have intermittent radicular pain into her lle to knee with extended walking. Reports reduction of pain after steroid injection a few month ago.  Slump test neg.  Hip flex and abduction strength is fairly good although resistance increases lbp, posterior core strength lacking.  Lumbar ROM also good but experiences pain in LB at end ranges. Her biggest complaint is ability to tolerate amb as pain begins immediately once up and gait initiated and is limited after 5 mins. She does use a cane out of home.  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 She is going to [facility] in December and wants to improve her walking as much as able.  She will benefit from skilled physical therapy intervention beginning in aquatics to initiate reduction and improved management of pain and strengthening/ stretching post core using the properties of water.  Will transition to land based as tolerated.  OBJECTIVE IMPAIRMENTS: Abnormal gait, decreased activity tolerance, decreased balance, decreased endurance, difficulty walking, decreased strength, obesity, and pain.   ACTIVITY LIMITATIONS: carrying, lifting, bending, sitting, standing, stairs, transfers, and locomotion level  PARTICIPATION LIMITATIONS: meal prep, cleaning, laundry, shopping, community activity, and yard work  PERSONAL FACTORS:  Fitness and 1-2 comorbidities: see problem list  are also affecting patient's functional outcome.   REHAB POTENTIAL: Good  CLINICAL DECISION MAKING: Evolving/moderate complexity  EVALUATION COMPLEXITY: Moderate   GOALS: Goals reviewed with patient? Yes  SHORT TERM GOALS: Target date: 09/05/23  Pt will tolerate full aquatic sessions consistently without increase in pain and with improving function to demonstrate good toleration and effectiveness of intervention.   Baseline: Goal status: INITIAL  2.  Pt will report feeling "looser" in hips and back with stretching submerged (personal goal) Baseline: tight Goal status: INITIAL  3.  Pt will improve on Tug test to <or=13.5 (community dwelling adults) to demonstrate improvement in lower extremity function, mobility and decreased fall risk. Baseline: 15.42 Goal status: INITIAL  LONG TERM GOALS: Target date: 10/11/23  Pt to improve on ODI to below 20% to demonstrate statistically significant Improvement in function. Baseline: 17/45=37% Goal status: INITIAL  2.  Pt will report  a 75% reduction in LBP Baseline:  Goal status: INITIAL  3.  Pt will improve on 5 X STS test to <or= 17s ([facility]) to demonstrate improving functional lower extremity strength, transitional movements, and balance Baseline: 21.88 Goal status: INITIAL  4.  Pt will perform full lumbar ROM without pain Baseline: see chart Goal status: INITIAL  5.  Pt will report amb community distances without limitation to pain (up to 2000 ft) with AD as needed Baseline:  Goal status: INITIAL    PLAN:  PT FREQUENCY: 1-2x/week  PT DURATION: 8 weeks  PLANNED INTERVENTIONS: 97164- PT Re-evaluation, 97110-Therapeutic exercises, 97530- Therapeutic activity, 97112- Neuromuscular re-education, 97535- Self Care, 04540- Manual therapy, [phone]- Gait training, [phone]- Orthotic Fit/training, [phone]- Aquatic Therapy, [phone]- Electrical stimulation (unattended), [phone]- Ionotophoresis 4mg /ml Dexamethasone, Patient/Family education, Balance training, Stair training, Taping, Dry Needling, Joint mobilization, DME instructions, Cryotherapy, and Moist heat.  PLAN FOR NEXT SESSION: aquatics: glut and posterior core strengthening; gait. Balance stretching LE and lumbar spine   Corrie Dandy Tomma Lightning) Tehila Sokolow MPT 08/19/23 11:33 AM [facility] [address] [place], [place], [address] Phone: [phone]   Fax:   [phone]

## 2023-08-20 ENCOUNTER — Ambulatory Visit: Payer: 59 | Admitting: Family

## 2023-08-22 ENCOUNTER — Ambulatory Visit: Admitting: Sports Medicine

## 2023-08-22 ENCOUNTER — Encounter: Payer: Self-pay | Admitting: Sports Medicine

## 2023-08-22 DIAGNOSIS — M48061 Spinal stenosis, lumbar region without neurogenic claudication: Secondary | ICD-10-CM | POA: Diagnosis not present

## 2023-08-22 DIAGNOSIS — M1612 Unilateral primary osteoarthritis, left hip: Secondary | ICD-10-CM

## 2023-08-22 DIAGNOSIS — M5442 Lumbago with sciatica, left side: Secondary | ICD-10-CM | POA: Diagnosis not present

## 2023-08-22 DIAGNOSIS — G8929 Other chronic pain: Secondary | ICD-10-CM | POA: Diagnosis not present

## 2023-08-22 NOTE — Progress Notes (Signed)
 Audrey Goodman - 53 y.o. female MRN [id number]  Date of birth: June 19, 1970  Telephone Visit Note: Visit Date: 08/22/2023 PCP: [name], NP Referred by: [name], NP   - Telephone visit: - Patient confirmed identity with 2 patient identifers - Location: Patient home   Subjective: Chief Complaint  Patient presents with   Lower Back - Follow-up   HPI: Audrey Goodman is a pleasant 53 y.o. female who presents today for follow-up of lumbar stenosis, left hip osteoarthritis.  She requested for modification of [facility] access for transportation.  I did have a few questions regarding this to help fill out the form.  In general, she is doing fairly well.  She did recently start Celebrex which is helping some and able to wean down on her gabapentin compared to previously.  She has started aquatic physical therapy, she is excited about this, planning on going 2 times weekly this upcoming week as well as transitioning to some land therapy as well.  - started aquatic physical therapy --> going up to 2x weekly  - celebrex helping some - Gabapentin 300mg  TID   Pertinent ROS were reviewed with the patient and found to be negative unless otherwise specified above in HPI.   Assessment & Plan: Visit Diagnoses:  1. Foraminal stenosis of lumbar region   2. Chronic left-sided low back pain with left-sided sciatica   3. Unilateral primary osteoarthritis, left hip    Telephone visit: - Start time: 08:48am                           - End time: 09:01am  Plan: Impression is chronic low back pain with left-sided radiculopathy in the setting of foraminal stenosis.  Also with hip osteoarthritis.  She will continue through aquatic-based physical therapy and transition to land therapy.  She may use her Celebrex 100 mg once to twice daily as needed.  Continue gabapentin 300 mg 3 times daily.  I did fill out her form today for G[place] access for transportation, this will be faxed later  today.  I would like to follow-up with her the first or second week in May to reevaluate how she is doing with the above and her progress with aquatic PT.  Follow-up: Return in about 6 weeks (around 10/03/2023).   Meds & Orders: No orders of the defined types were placed in this encounter.  No orders of the defined types were placed in this encounter.    Procedures: No procedures performed      Clinical History: No specialty comments available.  She reports that she has never smoked. She has never used smokeless tobacco.  Recent Labs    02/20/23 1014  HGBA1C 7.2*    Objective:    Physical Exam  Gen: Well-sounding.  Pleasant and appropriate in conversation.  No shortness of breath noted through conversation on telephone.  Imaging: No results found.  Past Medical/Family/Surgical/Social History: Medications & Allergies reviewed per EMR, new medications updated. Patient Active Problem List   Diagnosis Date Noted   Snoring 07/10/2023   Insomnia 02/20/2023   Attention deficit hyperactivity disorder, combined type, moderate 03/01/2022   Impacted cerumen of both ears 01/23/2021   Left Achilles tendinitis 06/19/2017   Depression 07/13/2015   Preventative health care 06/23/2014   PCOS (polycystic ovarian syndrome) 09/10/2013   CHOLELITHIASIS 02/20/2008   Hyperlipidemia 08/06/2006   Uncontrolled diabetes mellitus with hyperglycemia (HCC) 07/30/2006   HYPERTENSION, BENIGN ESSENTIAL 07/30/2006  MORBID OBESITY 07/25/2006   Past Medical History:  Diagnosis Date   Anxiety    Arthritis    left shoulder   Back pain    Chronic kidney disease    stage 1   Depression    Diabetes mellitus without complication (HCC)    type 2- on meds   Diverticulitis    High cholesterol    History of chicken pox    Hyperlipidemia    on meds   Hypertension    on meds   Joint pain    Lactose intolerance    PCOS (polycystic ovarian syndrome)    Seasonal allergies    Shingles    Sleep apnea     does NOT use CPAP   SVD (spontaneous vaginal delivery) 1995   x 1   Type 2 diabetes mellitus, uncontrolled 07/30/2006   Qualifier: Diagnosis of  By: Lanier Prude  MD, Cathrine Muster     Family History  Problem Relation Age of Onset   Obesity Mother    Colon polyps Mother    Cancer Mother        breast   Hyperlipidemia Mother    Heart disease Mother    Hypertension Mother    Mental illness Mother        depression   Diabetes Mother    Alcoholism Father    Hyperlipidemia Father    Stroke Father    Hypertension Father    Cancer Maternal Grandmother        colon   Diabetes Maternal Grandmother    Diabetes Maternal Aunt    Glaucoma Maternal Aunt    Colon polyps Maternal Uncle    Colon cancer Maternal Uncle    Diabetes Maternal Uncle    Diabetes Maternal Uncle    Esophageal cancer Neg Hx    Rectal cancer Neg Hx    Stomach cancer Neg Hx    Past Surgical History:  Procedure Laterality Date   ANKLE FRACTURE SURGERY  05/28/1989   right   CHOLECYSTECTOMY  05/28/2006   COLONOSCOPY  1989   due to blood in stool and diverticulitis per pt   HYSTEROSCOPY N/A 09/13/2014   Procedure: HYSTEROSCOPY with IUD removal;  Surgeon: Mitchel Honour, DO;  Location: [facility];  Service: Gynecology;  Laterality: N/A;  need longest speculum and instruments that aresavailable due to patient's habitus (BMI ~60)   Social History   Occupational History   Not on file  Tobacco Use   Smoking status: Never   Smokeless tobacco: Never  Vaping Use   Vaping status: Never Used  Substance and Sexual Activity   Alcohol use: No    Alcohol/week: 0.0 standard drinks of alcohol   Drug use: No   Sexual activity: Yes    Partners: Male    Birth control/protection: Pill

## 2023-08-23 ENCOUNTER — Ambulatory Visit: Payer: 59 | Admitting: Family

## 2023-08-23 ENCOUNTER — Encounter: Payer: Self-pay | Admitting: Adult Health

## 2023-08-23 ENCOUNTER — Telehealth: Payer: Self-pay | Admitting: Family

## 2023-08-23 ENCOUNTER — Telehealth: Payer: 59 | Admitting: Adult Health

## 2023-08-23 VITALS — BP 121/72 | HR 108 | Temp 99.5°F | Resp 16 | Ht 65.0 in | Wt 260.0 lb

## 2023-08-23 DIAGNOSIS — R0683 Snoring: Secondary | ICD-10-CM | POA: Diagnosis not present

## 2023-08-23 DIAGNOSIS — E1165 Type 2 diabetes mellitus with hyperglycemia: Secondary | ICD-10-CM | POA: Diagnosis not present

## 2023-08-23 DIAGNOSIS — F902 Attention-deficit hyperactivity disorder, combined type: Secondary | ICD-10-CM

## 2023-08-23 DIAGNOSIS — F32A Depression, unspecified: Secondary | ICD-10-CM | POA: Diagnosis not present

## 2023-08-23 DIAGNOSIS — I10 Essential (primary) hypertension: Secondary | ICD-10-CM | POA: Diagnosis not present

## 2023-08-23 DIAGNOSIS — Z7984 Long term (current) use of oral hypoglycemic drugs: Secondary | ICD-10-CM

## 2023-08-23 DIAGNOSIS — E785 Hyperlipidemia, unspecified: Secondary | ICD-10-CM | POA: Diagnosis not present

## 2023-08-23 DIAGNOSIS — Z794 Long term (current) use of insulin: Secondary | ICD-10-CM

## 2023-08-23 LAB — BASIC METABOLIC PANEL WITH GFR
BUN: 20 mg/dL (ref 6–23)
CO2: 27 meq/L (ref 19–32)
Calcium: 10 mg/dL (ref 8.4–10.5)
Chloride: 102 meq/L (ref 96–112)
Creatinine, Ser: 0.86 mg/dL (ref 0.40–1.20)
GFR: 78.03 mL/min (ref >60.00)
Glucose, Bld: 152 mg/dL — ABNORMAL HIGH (ref 70–99)
Potassium: 4.8 meq/L (ref 3.5–5.1)
Sodium: 138 meq/L (ref 135–145)

## 2023-08-23 LAB — LIPID PANEL
Cholesterol: 188 mg/dL (ref 0–200)
HDL: 56.9 mg/dL (ref >39.00)
LDL Cholesterol: 109 mg/dL — ABNORMAL HIGH (ref 0–99)
NonHDL: 130.95
Total CHOL/HDL Ratio: 3
Triglycerides: 111 mg/dL (ref 0.0–149.0)
VLDL: 22.2 mg/dL (ref 0.0–40.0)

## 2023-08-23 MED ORDER — LANTUS SOLOSTAR 100 UNIT/ML ~~LOC~~ SOPN: 10.0000 [IU] | PEN_INJECTOR | Freq: Every day | SUBCUTANEOUS | Status: DC

## 2023-08-23 NOTE — Assessment & Plan Note (Signed)
 Mood has been stable on prozac despite recent increase in stress.

## 2023-08-23 NOTE — Telephone Encounter (Signed)
 See mychart.

## 2023-08-23 NOTE — Assessment & Plan Note (Signed)
 Stable on amlodipine and aldactone.

## 2023-08-23 NOTE — Assessment & Plan Note (Signed)
 Lab Results  Component Value Date   CHOL 152 07/16/2022   HDL 49.20 07/16/2022   LDLCALC 81 07/16/2022   TRIG 109.0 07/16/2022   CHOLHDL 3 07/16/2022   Maintained on crestor, will update lipid panel.

## 2023-08-23 NOTE — Progress Notes (Signed)
 Virtual Visit via Video Note  I connected with Chauncey Cruel on 08/23/23 at  2:00 PM EDT by a video enabled telemedicine application and verified that I am speaking with the correct person using two identifiers.  Location: Patient: Home  Provider: Office    I discussed the limitations of evaluation and management by telemedicine and the availability of in person appointments. The patient expressed understanding and agreed to proceed.  History of Present Illness: 53 year old female seen for sleep consult July 08, 2022 with For snoring, restless sleep and daytime sleepiness. Medical history significant for diabetes  Today's video visit is to review home sleep study results.  Patient was seen last month for sleep consult for snoring, daytime sleepiness and restless sleep.  She was set up for home study that was done on July 22, 2023 that showed no significant sleep apnea with AHI of 3.8/hour and mild O2 desaturations with SpO2 low at 86%.  Time spent below 88% was 0.5 minutes.  We reviewed her sleep study results in detail.  Patient does continue to have significant loud snoring and daytime sleepiness.  We went over alternative options including setting up for an in lab study all night NPSG as she has significant symptom burden and is at high suspicion for having sleep apnea.  Continue with healthy weight loss program.  We discussed positional sleep with head of the bed inclined and avoiding sleeping on her back.  Referral to orthodontics for oral appliance with the mandibular advancement device as she had loud snoring and fragmented sleep.  Patient is going to look into the dental device to see if insurance will provide coverage.  She wants to hold off on in lab study at this time due to potential financial burden.  Concerned that this may be cost prohibitive.      Observations/Objective: Appears well no acute distress  Assessment and Plan: Loud snoring, restless sleep and daytime  sleepiness.  Home sleep study did not show significant sleep apnea with AHI at 3.8/hour and minimum O2 desaturations.  Have very high suspicion of underlying sleep apnea.  Would recommend an in lab NPSG to further evaluate.  Patient wants to hold off at this time due to cost.  Referred to orthodontics with Dr. Myrtis Ser to evaluate for oral appliance to help with fragmented sleep and loud snoring.  Morbid obesity continue with healthy weight loss plan.   Plan  Patient Instructions  Oral appliance -Mandibular advancement device  Refer to Orthodontics Dr. Myrtis Ser [phone]  Call back if you change your mind on In lab sleep study-NPSG (Nocturnal Polysomnography)  Healthy sleep regimen  Do not drive if sleepy  Work on healthy weight loss  Sleep with head of your bed at incline -~30 degrees and avoid sleeping on your back.   Follow up in 6 months and As needed       Follow Up Instructions:    I discussed the assessment and treatment plan with the patient. The patient was provided an opportunity to ask questions and all were answered. The patient agreed with the plan and demonstrated an understanding of the instructions.   The patient was advised to call back or seek an in-person evaluation if the symptoms worsen or if the condition fails to improve as anticipated.  I provided 21 minutes of non-face-to-face time during this encounter.   Rubye Oaks, NP

## 2023-08-23 NOTE — Assessment & Plan Note (Addendum)
 Lab Results  Component Value Date   HGBA1C 7.2 (H) 02/20/2023   HGBA1C 6.3 (A) 09/28/2021   HGBA1C 7.0 (A) 05/31/2021   Lab Results  Component Value Date   MICROALBUR 2.9 (H) 07/16/2022   LDLCALC 81 07/16/2022   CREATININE 0.91 02/20/2023   She continues novolog SSI, metformin bid, farxiga, wegovy and lantus 10units.  Fair glucose control per patient.  Current glucose 177.

## 2023-08-23 NOTE — Progress Notes (Signed)
 Subjective:     Patient ID: Audrey Goodman, female    DOB: Jun 20, 1970, 53 y.o.   MRN: [id number]  Chief Complaint  Patient presents with   ADHD    Here for follow up   Diabetes    Here for follow up    Diabetes    Discussed the use of AI scribe software for clinical note transcription with the patient, who gave verbal consent to proceed.  History of Present Illness  Audrey Goodman is a 53 year old female with diabetes, hypertension, and PCOS who presents for follow-up of her chronic conditions.  She experiences episodes of hypoglycemia, leading her to reduce her Lantus dose to 10 units. Her blood glucose levels are inconsistent, and she often delays morning checks. She takes her morning medications, including Lantus, while still in bed. Her current regimen includes Novolog three times daily on a sliding scale, Metformin 750 mg XR twice daily, Farxiga, and Lantus 10 units in the morning. She keeps juice available for hypoglycemic episodes.  She has a history of hypertension and is currently taking Toprol XL, Entresto, spironolactone, and amlodipine. Spironolactone is primarily for her PCOS but also aids in blood pressure control. She mentions edema as a concern, which influenced her medication choices.  She is on [facility] for weight management and heart benefits. She reports a plateau in weight loss and is currently at 260 pounds, having lost weight since her last visit. She is concerned about weight gain despite eating less due to wisdom tooth issues.  She is taking Prozac for mood stabilization and reports it helps manage her stress and mood swings, although unexpected events can still cause her to have emotional outbursts. She is on Adderall for ADHD, which she finds helpful for focus, especially on weekends when she does not take it. She is concerned about stress affecting her focus and is interested in finding a therapist specializing in ADHD and chronic conditions.  She is on  Crestor for cholesterol management, although it has been a while since her cholesterol levels were last checked.     Health Maintenance Due  Topic Date Due   FOOT EXAM  09/29/2022   COVID-19 Vaccine (8 - 2024-25 season) 03/31/2023   HEMOGLOBIN A1C  08/20/2023    Past Medical History:  Diagnosis Date   Anxiety    Arthritis    left shoulder   Back pain    Chronic kidney disease    stage 1   Depression    Diabetes mellitus without complication (HCC)    type 2- on meds   Diverticulitis    High cholesterol    History of chicken pox    Hyperlipidemia    on meds   Hypertension    on meds   Joint pain    Lactose intolerance    PCOS (polycystic ovarian syndrome)    Seasonal allergies    Shingles    Sleep apnea    does NOT use CPAP   SVD (spontaneous vaginal delivery) 1995   x 1   Type 2 diabetes mellitus, uncontrolled 07/30/2006   Qualifier: Diagnosis of  By: Lanier Prude  MD, Cathrine Muster      Past Surgical History:  Procedure Laterality Date   ANKLE FRACTURE SURGERY  05/28/1989   right   CHOLECYSTECTOMY  05/28/2006   COLONOSCOPY  1989   due to blood in stool and diverticulitis per pt   HYSTEROSCOPY N/A 09/13/2014   Procedure: HYSTEROSCOPY with IUD removal;  Surgeon: Mitchel Honour,  DO;  Location: [facility];  Service: Gynecology;  Laterality: N/A;  need longest speculum and instruments that aresavailable due to patient's habitus (BMI ~60)    Family History  Problem Relation Age of Onset   Obesity Mother    Colon polyps Mother    Cancer Mother        breast   Hyperlipidemia Mother    Heart disease Mother    Hypertension Mother    Mental illness Mother        depression   Diabetes Mother    Alcoholism Father    Hyperlipidemia Father    Stroke Father    Hypertension Father    Cancer Maternal Grandmother        colon   Diabetes Maternal Grandmother    Diabetes Maternal Aunt    Glaucoma Maternal Aunt    Colon polyps Maternal Uncle    Colon cancer Maternal Uncle     Diabetes Maternal Uncle    Diabetes Maternal Uncle    Esophageal cancer Neg Hx    Rectal cancer Neg Hx    Stomach cancer Neg Hx     Social History   Socioeconomic History   Marital status: Single    Spouse name: Not on file   Number of children: Not on file   Years of education: Not on file   Highest education level: Bachelor's degree (e.g., BA, AB, BS)  Occupational History   Not on file  Tobacco Use   Smoking status: Never   Smokeless tobacco: Never  Vaping Use   Vaping status: Never Used  Substance and Sexual Activity   Alcohol use: No    Alcohol/week: 0.0 standard drinks of alcohol   Drug use: No   Sexual activity: Yes    Partners: Male    Birth control/protection: Pill  Other Topics Concern   Not on file  Social History Narrative   Single- lives alone   Son 21- senior at [employer]   Enjoys reading   Works as a Advertising account planner at [employer] Strain: Medium Risk (08/16/2023)   Overall Financial Resource Strain (CARDIA)    Difficulty of Paying Living Expenses: Somewhat hard  Food Insecurity: No Food Insecurity (08/16/2023)   Hunger Vital Sign    Worried About Running Out of Food in the Last Year: Never true    Ran Out of Food in the Last Year: Never true  Transportation Needs: No Transportation Needs (08/16/2023)   PRAPARE - Administrator, Civil Service (Medical): No    Lack of Transportation (Non-Medical): No  Physical Activity: Unknown (08/16/2023)   Exercise Vital Sign    Days of Exercise per Week: 0 days    Minutes of Exercise per Session: Not on file  Stress: Stress Concern Present (08/16/2023)   [employer] of Occupational Health - Occupational Stress Questionnaire    Feeling of Stress : To some extent  Social Connections: Socially Isolated (08/16/2023)   Social Connection and Isolation Panel [NHANES]    Frequency of Communication with Friends and Family: More than three times a week     Frequency of Social Gatherings with Friends and Family: Never    Attends Religious Services: Never    Database administrator or Organizations: No    Attends Engineer, structural: Not on file    Marital Status: Never married  Intimate Partner Violence: Not on file    Outpatient Medications Prior to Visit  Medication Sig Dispense Refill   amLODipine (NORVASC) 5 MG tablet Take 1 tablet (5 mg total) by mouth daily. 90 tablet 0   amphetamine-dextroamphetamine (ADDERALL XR) 30 MG 24 hr capsule Take 1 capsule (30 mg total) by mouth every morning. 30 capsule 0   celecoxib (CELEBREX) 100 MG capsule Take 1 capsule (100 mg total) by mouth 2 (two) times daily. 60 capsule 0   cetirizine (ZYRTEC) 10 MG tablet Take 10 mg by mouth daily.     Continuous Glucose Sensor (DEXCOM G7 SENSOR) MISC 1 Device by Does not apply route continuous. 9 each 3   dapagliflozin propanediol (FARXIGA) 10 MG TABS tablet Take 1 tablet (10 mg total) by mouth daily. 90 tablet 1   FLUoxetine (PROZAC) 20 MG capsule Take 1 capsule (20 mg total) by mouth daily. 90 capsule 1   fluticasone (FLONASE) 50 MCG/ACT nasal spray Place 2 sprays into both nostrils daily. 48 g 1   insulin aspart (NOVOLOG FLEXPEN) 100 UNIT/ML FlexPen Inject 12-15 Units into the skin 3 (three) times daily with meals. INJECT 12-15 UNITS (DEPENDING ON MEAL SIZE) UNDER THE SKIN 3 TIMES A DAYINJECT 12-15 UNITS (DEPENDING ON MEAL SIZE) UNDER THE SKIN 3 TIMES A DAY 45 mL 3   Insulin Pen Needle 32G X 4 MM MISC Use to inject insulin 4 times daily 400 each 3   metFORMIN (GLUCOPHAGE-XR) 750 MG 24 hr tablet TAKE 1 TABLET BY MOUTH 2 TIMES DAILY. 180 tablet 3   Multiple Vitamin (MULTIVITAMIN PO) Take 1 tablet by mouth daily at 6 (six) AM.     rosuvastatin (CRESTOR) 10 MG tablet Take 1 tablet (10 mg total) by mouth daily. 90 tablet 1   Semaglutide-Weight Management 2.4 MG/0.75ML SOAJ Inject 2.4 mg into the skin once a week. 9 mL 3   spironolactone (ALDACTONE) 50 MG tablet  Take 1 tablet (50 mg total) by mouth 2 (two) times daily. 180 tablet 3   insulin glargine (LANTUS SOLOSTAR) 100 UNIT/ML Solostar Pen Inject 30 Units into the skin daily. 30 mL 3   No facility-administered medications prior to visit.    Allergies  Allergen Reactions   Lisinopril Swelling    Swelling of the left side of tongue   Lipitor [Atorvastatin] Other (See Comments)    Myalgia/foot cramping    ROS See HPI    Objective:    Physical Exam Constitutional:      General: She is not in acute distress.    Appearance: Normal appearance. She is well-developed.  HENT:     Head: Normocephalic and atraumatic.     Right Ear: External ear normal.     Left Ear: External ear normal.  Eyes:     General: No scleral icterus. Neck:     Thyroid: No thyromegaly.  Cardiovascular:     Rate and Rhythm: Normal rate and regular rhythm.     Heart sounds: Normal heart sounds. No murmur heard. Pulmonary:     Effort: Pulmonary effort is normal. No respiratory distress.     Breath sounds: Normal breath sounds. No wheezing.  Musculoskeletal:     Cervical back: Neck supple.  Skin:    General: Skin is warm and dry.  Neurological:     Mental Status: She is alert and oriented to person, place, and time.  Psychiatric:        Mood and Affect: Mood normal.        Behavior: Behavior normal.        Thought Content: Thought content normal.  Judgment: Judgment normal.      BP 121/72 (BP Location: Left Arm, Patient Position: Sitting, Cuff Size: Large)   Pulse (!) 108   Temp 99.5 F (37.5 C) (Oral)   Resp 16   Ht 5\' 5"  (1.651 m)   Wt 260 lb (117.9 kg)   SpO2 100%   BMI 43.27 kg/m  Wt Readings from Last 3 Encounters:  08/23/23 260 lb (117.9 kg)  07/09/23 262 lb (118.8 kg)  03/18/23 254 lb 9.6 oz (115.5 kg)       Assessment & Plan:   Problem List Items Addressed This Visit       Unprioritized   Uncontrolled diabetes mellitus with hyperglycemia (HCC) - Primary   Lab Results   Component Value Date   HGBA1C 7.2 (H) 02/20/2023   HGBA1C 6.3 (A) 09/28/2021   HGBA1C 7.0 (A) 05/31/2021   Lab Results  Component Value Date   MICROALBUR 2.9 (H) 07/16/2022   LDLCALC 81 07/16/2022   CREATININE 0.91 02/20/2023   She continues novolog SSI, metformin bid, farxiga, wegovy and lantus 10units.  Fair glucose control per patient.  Current glucose 177.       Relevant Medications   insulin glargine (LANTUS SOLOSTAR) 100 UNIT/ML Solostar Pen   Other Relevant Orders   HgB A1c   Basic Metabolic Panel (BMET)   HYPERTENSION, BENIGN ESSENTIAL   Stable on amlodipine and aldactone.      Hyperlipidemia   Lab Results  Component Value Date   CHOL 152 07/16/2022   HDL 49.20 07/16/2022   LDLCALC 81 07/16/2022   TRIG 109.0 07/16/2022   CHOLHDL 3 07/16/2022   Maintained on crestor, will update lipid panel.      Relevant Orders   Lipid panel   Depression   Mood has been stable on prozac despite recent increase in stress.       Attention deficit hyperactivity disorder, combined type, moderate   Focus has been fair on Adderall xr 30mg .  She has tried not taking it on the weekends and if she does not take it she can't complete any tasks.         I have changed Latriece C. Joens's Lantus SoloStar. I am also having her maintain her Multiple Vitamin (MULTIVITAMIN PO), cetirizine, metFORMIN, fluticasone, Dexcom G7 Sensor, dapagliflozin propanediol, Insulin Pen Needle, NovoLOG FlexPen, rosuvastatin, Semaglutide-Weight Management, FLUoxetine, amLODipine, celecoxib, amphetamine-dextroamphetamine, and spironolactone.  Meds ordered this encounter  Medications   insulin glargine (LANTUS SOLOSTAR) 100 UNIT/ML Solostar Pen    Sig: Inject 10 Units into the skin daily.    Supervising Provider:   Danise Edge A [phone]

## 2023-08-23 NOTE — Assessment & Plan Note (Signed)
 Focus has been fair on Adderall xr 30mg .  She has tried not taking it on the weekends and if she does not take it she can't complete any tasks.

## 2023-08-23 NOTE — Progress Notes (Addendum)
   Established Patient Office Visit  Subjective   Patient ID: Audrey Goodman, female    DOB: [birth year]  Age: 53 y.o. MRN: [id number]  Chief Complaint  Patient presents with   ADHD    Here for follow up   Diabetes    Here for follow up    Pleasant 53 yo patient presenting for routine follow up of chronic conditions. States her greatest concern is related to feelings of increased stress from health, financial concerns, and transportation issues. She states that she has seen a counselor in the past and feels like that helped; however, she would like to see "another counselor who specializes in ADHD and health problems." She reports she had her wisdom teeth extracted 2-3 weeks ago and is healing well from that.   Diabetes   Wt Readings from Last 3 Encounters:  08/23/23 260 lb (117.9 kg)  07/09/23 262 lb (118.8 kg)  03/18/23 254 lb 9.6 oz (115.5 kg)    Lab Results  Component Value Date   HGBA1C 7.2 (H) 02/20/2023       ROS See HPI   Objective:     BP 121/72 (BP Location: Left Arm, Patient Position: Sitting, Cuff Size: Large)   Pulse (!) 108   Temp 99.5 F (37.5 C) (Oral)   Resp 16   Ht 5\' 5"  (1.651 m)   Wt 260 lb (117.9 kg)   SpO2 100%   BMI 43.27 kg/m    Physical Exam Vitals reviewed.  Constitutional:      Appearance: Normal appearance.  HENT:     Head: Normocephalic and atraumatic.     Right Ear: External ear normal.     Left Ear: External ear normal.     Nose: No congestion or rhinorrhea.     Mouth/Throat:     Mouth: Mucous membranes are moist.     Pharynx: Oropharynx is clear.  Eyes:     Conjunctiva/sclera: Conjunctivae normal.     Pupils: Pupils are equal, round, and reactive to light.  Cardiovascular:     Rate and Rhythm: Normal rate and regular rhythm.     Pulses: Normal pulses.     Heart sounds: Normal heart sounds.  Pulmonary:     Effort: Pulmonary effort is normal.     Breath sounds: Normal breath sounds.  Abdominal:     Palpations: Abdomen  is soft.  Musculoskeletal:     Comments: Trace bilateral lower extremity edema  Skin:    General: Skin is warm and dry.     Capillary Refill: Capillary refill takes less than 2 seconds.  Neurological:     Mental Status: She is alert and oriented to person, place, and time.  Psychiatric:        Mood and Affect: Mood normal.        Behavior: Behavior normal.      Assessment & Plan:   -Depression - stable mood on fluoxetine. Verbalizes increased stress.    -Hypertension - stable on amlodipine and spironolactone.   -Uncontrolled diabetes mellitus - will recheck labs today. (-) DFE today. Continues on Farxiga, Novolog, Wegovy, metformin, and Lantus Solostar. Patient reports she was experiencing hypoglycemia occasionally. Per patient, endo decreased Lantus Solostar from 30 units to 10 units.  -Hyperlipidemia - repeat lipid panel today. Continues on Crestor.  -ADHD - stable on Adderall XR when she takes it.    Cristopher Peru, RN

## 2023-08-23 NOTE — Patient Instructions (Signed)
 VISIT SUMMARY:  Today, you were seen for intermittent right upper quadrant abdominal pain, diabetes management, hypertension, hyperlipidemia, asthma, and hair loss. We discussed your symptoms, current medications, and necessary follow-up tests.  YOUR PLAN:  -INTERMITTENT EPIGASTRIC AND RIGHT UPPER QUADRANT ABDOMINAL PAIN: This type of pain can be related to gallbladder issues. We will perform an ultrasound to investigate further. Continue taking Pepcid as needed and avoid fatty foods to help manage your symptoms.  -TYPE 2 DIABETES MELLITUS: Your blood sugar levels are slightly above the target. We will check your A1c level to monitor your diabetes. Continue taking Rybelsus, Lantus, and Jardiance as prescribed.  -HYPERTENSION: Your blood pressure is well-controlled with your current medications. Continue taking amlodipine, losartan, and hydralazine as prescribed.  -HYPERLIPIDEMIA: Your cholesterol levels are well-controlled. Continue taking Lipitor as prescribed. We will update your lipid panel to monitor your cholesterol levels.  -ASTHMA: Your asthma symptoms are well-managed. Continue using your mask at work to avoid triggers and follow your current asthma management plan.  -HAIR LOSS: You have reported patchy hair loss and have a family history of similar issues. We will refer you to dermatology for further evaluation and possible treatments.  -GENERAL HEALTH MAINTENANCE: You are due for a mammogram and a DEXA scan. We will also confirm the status of your cervix post-hysterectomy. These tests are important for your overall health maintenance.  INSTRUCTIONS:  Please schedule your ultrasound and mammogram during this visit. Follow up in six months for a routine check-up and to review the results of your tests.

## 2023-08-23 NOTE — Patient Instructions (Addendum)
 Oral appliance -Mandibular advancement device  Refer to Orthodontics Dr. Myrtis Ser [phone]  Call back if you change your mind on In lab sleep study-NPSG (Nocturnal Polysomnography)  Healthy sleep regimen  Do not drive if sleepy  Work on healthy weight loss  Sleep with head of your bed at incline -~30 degrees and avoid sleeping on your back.   Follow up in 6 months and As needed

## 2023-08-24 LAB — HEMOGLOBIN A1C: Hgb A1c MFr Bld: 7.1 % — ABNORMAL HIGH (ref 4.6–6.5)

## 2023-08-26 ENCOUNTER — Encounter (HOSPITAL_BASED_OUTPATIENT_CLINIC_OR_DEPARTMENT_OTHER): Payer: Self-pay | Admitting: Physical Therapy

## 2023-08-26 ENCOUNTER — Ambulatory Visit (HOSPITAL_BASED_OUTPATIENT_CLINIC_OR_DEPARTMENT_OTHER): Admitting: Physical Therapy

## 2023-08-26 ENCOUNTER — Other Ambulatory Visit: Payer: Self-pay | Admitting: Family

## 2023-08-26 DIAGNOSIS — M6281 Muscle weakness (generalized): Secondary | ICD-10-CM

## 2023-08-26 DIAGNOSIS — R262 Difficulty in walking, not elsewhere classified: Secondary | ICD-10-CM

## 2023-08-26 DIAGNOSIS — M48061 Spinal stenosis, lumbar region without neurogenic claudication: Secondary | ICD-10-CM | POA: Diagnosis not present

## 2023-08-26 DIAGNOSIS — M5459 Other low back pain: Secondary | ICD-10-CM

## 2023-08-26 NOTE — Telephone Encounter (Signed)
 Sugar is stable. Cholesterol above goal. Increase crestor from 10mg  to 20mg .  Please confirm where she would like it sent.

## 2023-08-26 NOTE — Therapy (Signed)
 OUTPATIENT PHYSICAL THERAPY THORACOLUMBAR TREATMENT   Patient Name: Audrey Goodman MRN: [id number] DOB:1971/03/18, 53 y.o., female Today's Date: 08/26/2023  END OF SESSION:  PT End of Session - 08/26/23 0816     Visit Number 3    Number of Visits 16    Date for PT Re-Evaluation 10/11/23    Authorization Type uhc    PT Start Time 0804    PT Stop Time 0842    PT Time Calculation (min) 38 min    Activity Tolerance Patient tolerated treatment well    Behavior During Therapy WFL for tasks assessed/performed              Past Medical History:  Diagnosis Date   Anxiety    Arthritis    left shoulder   Back pain    Chronic kidney disease    stage 1   Depression    Diabetes mellitus without complication (HCC)    type 2- on meds   Diverticulitis    High cholesterol    History of chicken pox    Hyperlipidemia    on meds   Hypertension    on meds   Joint pain    Lactose intolerance    PCOS (polycystic ovarian syndrome)    Seasonal allergies    Shingles    Sleep apnea    does NOT use CPAP   SVD (spontaneous vaginal delivery) 1995   x 1   Type 2 diabetes mellitus, uncontrolled 07/30/2006   Qualifier: Diagnosis of  By: Lanier Prude  MD, Cathrine Muster     Past Surgical History:  Procedure Laterality Date   ANKLE FRACTURE SURGERY  05/28/1989   right   CHOLECYSTECTOMY  05/28/2006   COLONOSCOPY  1989   due to blood in stool and diverticulitis per pt   HYSTEROSCOPY N/A 09/13/2014   Procedure: HYSTEROSCOPY with IUD removal;  Surgeon: Mitchel Honour, DO;  Location: [facility];  Service: Gynecology;  Laterality: N/A;  need longest speculum and instruments that aresavailable due to patient's habitus (BMI ~60)   Patient Active Problem List   Diagnosis Date Noted   Snoring 07/10/2023   Insomnia 02/20/2023   Attention deficit hyperactivity disorder, combined type, moderate 03/01/2022   Impacted cerumen of both ears 01/23/2021   Left Achilles tendinitis 06/19/2017   Depression 07/13/2015    Preventative health care 06/23/2014   PCOS (polycystic ovarian syndrome) 09/10/2013   CHOLELITHIASIS 02/20/2008   Hyperlipidemia 08/06/2006   Uncontrolled diabetes mellitus with hyperglycemia (HCC) 07/30/2006   HYPERTENSION, BENIGN ESSENTIAL 07/30/2006   MORBID OBESITY 07/25/2006    PCP: [name] NP  REFERRING PROVIDER:   Madelyn Brunner, DO    REFERRING DIAG:  [phone] (ICD-10-CM) - Foraminal stenosis of lumbar region  M54.42,G89.29 (ICD-10-CM) - Chronic left-sided low back pain with left-sided sciatica    Rationale for Evaluation and Treatment: Rehabilitation  THERAPY DIAG:  Other low back pain  Muscle weakness (generalized)  Difficulty in walking, not elsewhere classified  ONSET DATE: April 2024  SUBJECTIVE:  SUBJECTIVE STATEMENT: Pt reports that she was sore for 2 days after initial session.  She reports that her back bothered her from sitting at church yesterday.  POOL ACCESS: currently none.   Initial subjective I was told I had left hip OA but minimal, shouldn't be causing as much pain as I am having.  Had shot in L3 which pulled my pain down (Oct 2024).  Had another in L4 without relief (Jan 2025).  Pain is daily, occasionally waking at night. Started when I began walking more a little over 1 year ago at [employer].  Have been up a down since  PERTINENT HISTORY:  Left shoulder OA, chronic kidney disease, type 2 DM, HLD, right ankle fracture '91, morbid obesity   PAIN:  Are you having pain? Yes: NPRS scale: 6/10 Pain location: lumbar spine with radicular pain into left buttock and anterior left thigh Pain description: ache intermittent Aggravating factors: walking few minutes; sitting > 1 hour Relieving factors: sitting, standing  PRECAUTIONS: None  RED  FLAGS: None   WEIGHT BEARING RESTRICTIONS: No  FALLS:  Has patient fallen in last 6 months? No  LIVING ENVIRONMENT: Lives with: lives alone Lives in: House/apartment Stairs:  Avoids stairs Has following equipment at home: Single point cane  OCCUPATION: Claims specialist, works from home on a computer   PLOF: Independent  PATIENT GOALS: get looser in hips, decrease pain in back, going back to [place] in Dec  NEXT MD VISIT: as needed  OBJECTIVE:  Note: Objective measures were completed at Evaluation unless otherwise noted.  DIAGNOSTIC FINDINGS:  None in chart  PATIENT SURVEYS:  Modified Oswestry 17/45=37%   COGNITION: Overall cognitive status: Within functional limits for tasks assessed     SENSATION: WFL  MUSCLE LENGTH: Hamstrings: WFL tested in sitting    POSTURE: rounded shoulders and increased lumbar lordosis  PALPATION: TTP slight left IT band, lumbar paraspinals  LUMBAR ROM:   AROM eval  Flexion full  Extension Full P!  Right lateral flexion Full P!  Left lateral flexion full  Right rotation   Left rotation    (Blank rows = not tested)  LOWER EXTREMITY ROM:     wfl  LOWER EXTREMITY MMT:    MMT Right eval Left eval  Hip flexion 50.6 53.9  Hip extension    Hip abduction 33.2 37.6  Hip adduction    Hip internal rotation    Hip external rotation    Knee flexion    Knee extension    Ankle dorsiflexion    Ankle plantarflexion    Ankle inversion    Ankle eversion     (Blank rows = not tested)  LUMBAR SPECIAL TESTS:  Straight leg raise test: Negative and Slump test: Negative  FUNCTIONAL TESTS:  5 times sit to stand: 21.88 Timed up and go (TUG): 15.42 4 stage balance: 1&2 passed; tandem x 10s; SLS x2s  GAIT: Distance walked: 400 ft Assistive device utilized: Single point cane Level of assistance: Complete Independence Comments: slight bilateral hip trendelenburg  TREATMENT [facility] Adult PT Treatment:                                                 DATE: 08/26/23 Pt seen for aquatic therapy today.  Treatment took place in water 3.5-4.75 ft in depth at the [facility] pool. Temp of water was 91.  Pt entered/exited the pool via stairs using step to pattern with hand rail.  *unsupported walking forward/ backward with reciprocal arm swing * side stepping -> with arm abdct/ addct -> with rainbow hand floats * relaxed squat * farmers carry with rainbow hand floats - walking forward and back x 3 laps *L stretch x 2 * UE on wall: single leg clam x 8 each LE, cues to not light LE too high;  R/L hip ext to toe touch for hip flexor stretch x 20s x 2 *decompression position with noodle wrapped anteriorly across chest, with breast stroke arms: cycling across pool x 4 widths  Pt requires the buoyancy and hydrostatic pressure of water for support, and to offload joints by unweighting joint load by at least 50 % in navel deep water and by at least 75-80% in chest to neck deep water.  Viscosity of the water is needed for resistance of strengthening. Water current perturbations provides challenge to standing balance requiring increased core activation.                                                                                                                    PATIENT EDUCATION:  Education details: issued list of area pools and posture and body mechanics hand out  Person educated: Patient Education method: Explanation Education comprehension: verbalized understanding  HOME EXERCISE PROGRAM: Verbally assigned standing hip ext stretch 20s x 2, and "L" stretch x 20s  ASSESSMENT:  CLINICAL IMPRESSION: Pt reported gradual reduction of back pain to 4/10 during session. Dialed back session slightly to assess tolerance.  Discussed using lumbar pillow in work chair and at church to help maintain lumbar curve.  Goals are ongoing.     Initial Impression Patient is a 53 y.o. f who was seen today for physical therapy evaluation  and treatment for lumbar stenosis/LBP. She does have intermittent radicular pain into her lle to knee with extended walking. Reports reduction of pain after steroid injection a few month ago.  Slump test neg.  Hip flex and abduction strength is fairly good although resistance increases lbp, posterior core strength lacking.  Lumbar ROM also good but experiences pain in LB at end ranges. Her biggest complaint is ability to tolerate amb as pain begins immediately once up and gait initiated and is limited after 5 mins. She does use a cane out of home.  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 She is going to [facility] in December and wants to improve her walking as much as able.  She will benefit from skilled physical therapy intervention beginning in aquatics to initiate reduction and improved management of pain and strengthening/ stretching post core using the properties of water.  Will transition to land based as tolerated.  OBJECTIVE IMPAIRMENTS: Abnormal gait, decreased activity tolerance, decreased balance, decreased endurance, difficulty walking, decreased strength, obesity, and pain.   ACTIVITY LIMITATIONS: carrying, lifting, bending, sitting, standing, stairs, transfers, and locomotion level  PARTICIPATION LIMITATIONS: meal prep, cleaning, laundry, shopping, community activity, and yard  work  PERSONAL FACTORS: Fitness and 1-2 comorbidities: see problem list  are also affecting patient's functional outcome.   REHAB POTENTIAL: Good  CLINICAL DECISION MAKING: Evolving/moderate complexity  EVALUATION COMPLEXITY: Moderate   GOALS: Goals reviewed with patient? Yes  SHORT TERM GOALS: Target date: 09/05/23  Pt will tolerate full aquatic sessions consistently without increase in pain and with improving function to demonstrate good toleration and effectiveness of intervention.  Baseline: Goal status: INITIAL  2.  Pt will report feeling "looser" in hips and back with stretching submerged (personal  goal) Baseline: tight Goal status: INITIAL  3.  Pt will improve on Tug test to <or=13.5 (community dwelling adults) to demonstrate improvement in lower extremity function, mobility and decreased fall risk. Baseline: 15.42 Goal status: INITIAL  LONG TERM GOALS: Target date: 10/11/23  Pt to improve on ODI to below 20% to demonstrate statistically significant Improvement in function. Baseline: 17/45=37% Goal status: INITIAL  2.  Pt will report  a 75% reduction in LBP Baseline:  Goal status: INITIAL  3.  Pt will improve on 5 X STS test to <or= 17s (MDC) to demonstrate improving functional lower extremity strength, transitional movements, and balance Baseline: 21.88 Goal status: INITIAL  4.  Pt will perform full lumbar ROM without pain Baseline: see chart Goal status: INITIAL  5.  Pt will report amb community distances without limitation to pain (up to 2000 ft) with AD as needed Baseline:  Goal status: INITIAL    PLAN:  PT FREQUENCY: 1-2x/week  PT DURATION: 8 weeks  PLANNED INTERVENTIONS: 97164- PT Re-evaluation, 97110-Therapeutic exercises, 97530- Therapeutic activity, 97112- Neuromuscular re-education, 97535- Self Care, 60454- Manual therapy, [phone]- Gait training, [phone]- Orthotic Fit/training, [phone]- Aquatic Therapy, [phone]- Electrical stimulation (unattended), [phone]- Ionotophoresis 4mg /ml Dexamethasone, Patient/Family education, Balance training, Stair training, Taping, Dry Needling, Joint mobilization, DME instructions, Cryotherapy, and Moist heat.  PLAN FOR NEXT SESSION: aquatics: glut and posterior core strengthening; gait. Balance stretching LE and lumbar spine  Mayer Camel, PTA 08/26/23 9:31 AM [facility] [address] [place], [place], [address] Phone: [phone]   Fax:  [phone]

## 2023-08-27 NOTE — Telephone Encounter (Signed)
 Patient notified of result and recommendations. Patient will like to double up on her 10 mg for now. She will call us back to let us know when she is almost out of medication

## 2023-08-28 ENCOUNTER — Other Ambulatory Visit: Payer: Self-pay | Admitting: Sports Medicine

## 2023-08-29 ENCOUNTER — Encounter (HOSPITAL_BASED_OUTPATIENT_CLINIC_OR_DEPARTMENT_OTHER): Payer: Self-pay | Admitting: Physical Therapy

## 2023-08-29 ENCOUNTER — Ambulatory Visit (HOSPITAL_BASED_OUTPATIENT_CLINIC_OR_DEPARTMENT_OTHER): Attending: Sports Medicine | Admitting: Physical Therapy

## 2023-08-29 DIAGNOSIS — M6281 Muscle weakness (generalized): Secondary | ICD-10-CM | POA: Diagnosis present

## 2023-08-29 DIAGNOSIS — M5459 Other low back pain: Secondary | ICD-10-CM | POA: Insufficient documentation

## 2023-08-29 DIAGNOSIS — R262 Difficulty in walking, not elsewhere classified: Secondary | ICD-10-CM | POA: Diagnosis present

## 2023-08-29 NOTE — Therapy (Signed)
 OUTPATIENT PHYSICAL THERAPY THORACOLUMBAR TREATMENT   Patient Name: Audrey Goodman MRN: [id number] DOB:01-Apr-1971, 53 y.o., female Today's Date: 08/29/2023  END OF SESSION:  PT End of Session - 08/29/23 0810     Visit Number 4    Number of Visits 16    Date for PT Re-Evaluation 10/11/23    Authorization Type uhc    PT Start Time 0801    PT Stop Time 0840    PT Time Calculation (min) 39 min    Activity Tolerance Patient tolerated treatment well    Behavior During Therapy WFL for tasks assessed/performed               Past Medical History:  Diagnosis Date   Anxiety    Arthritis    left shoulder   Back pain    Chronic kidney disease    stage 1   Depression    Diabetes mellitus without complication (HCC)    type 2- on meds   Diverticulitis    High cholesterol    History of chicken pox    Hyperlipidemia    on meds   Hypertension    on meds   Joint pain    Lactose intolerance    PCOS (polycystic ovarian syndrome)    Seasonal allergies    Shingles    Sleep apnea    does NOT use CPAP   SVD (spontaneous vaginal delivery) 1995   x 1   Type 2 diabetes mellitus, uncontrolled 07/30/2006   Qualifier: Diagnosis of  By: Lanier Prude  MD, Cathrine Muster     Past Surgical History:  Procedure Laterality Date   ANKLE FRACTURE SURGERY  05/28/1989   right   CHOLECYSTECTOMY  05/28/2006   COLONOSCOPY  1989   due to blood in stool and diverticulitis per pt   HYSTEROSCOPY N/A 09/13/2014   Procedure: HYSTEROSCOPY with IUD removal;  Surgeon: Mitchel Honour, DO;  Location: [facility];  Service: Gynecology;  Laterality: N/A;  need longest speculum and instruments that aresavailable due to patient's habitus (BMI ~60)   Patient Active Problem List   Diagnosis Date Noted   Snoring 07/10/2023   Insomnia 02/20/2023   Attention deficit hyperactivity disorder, combined type, moderate 03/01/2022   Impacted cerumen of both ears 01/23/2021   Left Achilles tendinitis 06/19/2017   Depression  07/13/2015   Preventative health care 06/23/2014   PCOS (polycystic ovarian syndrome) 09/10/2013   CHOLELITHIASIS 02/20/2008   Hyperlipidemia 08/06/2006   Uncontrolled diabetes mellitus with hyperglycemia (HCC) 07/30/2006   HYPERTENSION, BENIGN ESSENTIAL 07/30/2006   MORBID OBESITY 07/25/2006    PCP: [name] NP  REFERRING PROVIDER:   Madelyn Brunner, DO    REFERRING DIAG:  [phone] (ICD-10-CM) - Foraminal stenosis of lumbar region  M54.42,G89.29 (ICD-10-CM) - Chronic left-sided low back pain with left-sided sciatica    Rationale for Evaluation and Treatment: Rehabilitation  THERAPY DIAG:  Other low back pain  Muscle weakness (generalized)  Difficulty in walking, not elsewhere classified  ONSET DATE: April 2024  SUBJECTIVE:  SUBJECTIVE STATEMENT: Pt reports no residual pain after last session.  "She states she is about the same" POOL ACCESS: currently none.   Initial subjective I was told I had left hip OA but minimal, shouldn't be causing as much pain as I am having.  Had shot in L3 which pulled my pain down (Oct 2024).  Had another in L4 without relief (Jan 2025).  Pain is daily, occasionally waking at night. Started when I began walking more a little over 1 year ago at [employer].  Have been up a down since  PERTINENT HISTORY:  Left shoulder OA, chronic kidney disease, type 2 DM, HLD, right ankle fracture '91, morbid obesity   PAIN:  Are you having pain? Yes: NPRS scale: 4/10 Pain location: lumbar spine with radicular pain into left buttock and anterior left thigh Pain description: ache intermittent Aggravating factors: walking few minutes; sitting > 1 hour Relieving factors: sitting, standing  PRECAUTIONS: None  RED FLAGS: None   WEIGHT BEARING RESTRICTIONS: No  FALLS:   Has patient fallen in last 6 months? No  LIVING ENVIRONMENT: Lives with: lives alone Lives in: House/apartment Stairs:  Avoids stairs Has following equipment at home: Single point cane  OCCUPATION: Claims specialist, works from home on a computer   PLOF: Independent  PATIENT GOALS: get looser in hips, decrease pain in back, going back to [place] in Dec  NEXT MD VISIT: as needed  OBJECTIVE:  Note: Objective measures were completed at Evaluation unless otherwise noted.  DIAGNOSTIC FINDINGS:  None in chart  PATIENT SURVEYS:  Modified Oswestry 17/45=37%   COGNITION: Overall cognitive status: Within functional limits for tasks assessed     SENSATION: WFL  MUSCLE LENGTH: Hamstrings: WFL tested in sitting    POSTURE: rounded shoulders and increased lumbar lordosis  PALPATION: TTP slight left IT band, lumbar paraspinals  LUMBAR ROM:   AROM eval  Flexion full  Extension Full P!  Right lateral flexion Full P!  Left lateral flexion full  Right rotation   Left rotation    (Blank rows = not tested)  LOWER EXTREMITY ROM:     wfl  LOWER EXTREMITY MMT:    MMT Right eval Left eval  Hip flexion 50.6 53.9  Hip extension    Hip abduction 33.2 37.6  Hip adduction    Hip internal rotation    Hip external rotation    Knee flexion    Knee extension    Ankle dorsiflexion    Ankle plantarflexion    Ankle inversion    Ankle eversion     (Blank rows = not tested)  LUMBAR SPECIAL TESTS:  Straight leg raise test: Negative and Slump test: Negative  FUNCTIONAL TESTS:  5 times sit to stand: 21.88 Timed up and go (TUG): 15.42 4 stage balance: 1&2 passed; tandem x 10s; SLS x2s  GAIT: Distance walked: 400 ft Assistive device utilized: Single point cane Level of assistance: Complete Independence Comments: slight bilateral hip trendelenburg  TREATMENT [facility] Adult PT Treatment:                                                DATE: 08/29/23 Pt seen for aquatic therapy  today.  Treatment took place in water 3.5-4.75 ft in depth at the [facility] pool. Temp of water was 91.  Pt entered/exited the pool via stairs using step to pattern  with hand rail.  *unsupported walking forward/ backward with reciprocal arm swing * side stepping -> with arm abdct/ addct -> with rainbow hand floats * relaxed squat * farmers carry with rainbow hand floats - walking forward and back x 3 laps *L stretch x 2 * UE on wall: single leg clam x 8 each LE, cues to not light LE too high;  R/L hip ext to toe touch *decompression position with noodle wrapped anteriorly across chest, with breast stroke arms: cycling across pool x 4 widths *standing resisted row elbows bent then straight 2 x 5  Pt requires the buoyancy and hydrostatic pressure of water for support, and to offload joints by unweighting joint load by at least 50 % in navel deep water and by at least 75-80% in chest to neck deep water.  Viscosity of the water is needed for resistance of strengthening. Water current perturbations provides challenge to standing balance requiring increased core activation.   Self care: suggested standing desk-found approp desks/looked up for pt /assisted with finding affordable  option.  Assisted pt with filling out FMLA forms.  Pt given copy to submit.                                                                                                      PATIENT EDUCATION:  Education details: issued list of area pools and posture and body mechanics hand out  Person educated: Patient Education method: Explanation Education comprehension: verbalized understanding  HOME EXERCISE PROGRAM: Verbally assigned standing hip ext stretch 20s x 2, and "L" stretch x 20s  ASSESSMENT:  CLINICAL IMPRESSION: Suggested standing desk due to seated position at computer all day which increases pain in Lumbar spine.  She will consider.  Added shoulder retraction with resistance for upper post core  strengthening with good toleration.  She does report abd bracing causes LB to hurt. Complete reduction in pain upon completion of treatment while submerged.  She has not yet had symptom relief post sessions as of yet although she did not have lingering pain either as she did her first session as well. Goals ongoing    Initial Impression Patient is a 53 y.o. f who was seen today for physical therapy evaluation and treatment for lumbar stenosis/LBP. She does have intermittent radicular pain into her lle to knee with extended walking. Reports reduction of pain after steroid injection a few month ago.  Slump test neg.  Hip flex and abduction strength is fairly good although resistance increases lbp, posterior core strength lacking.  Lumbar ROM also good but experiences pain in LB at end ranges. Her biggest complaint is ability to tolerate amb as pain begins immediately once up and gait initiated and is limited after 5 mins. She does use a cane out of home.  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 She is going to [facility] in December and wants to improve her walking as much as able.  She will benefit from skilled physical therapy intervention beginning in aquatics to initiate reduction and improved management of pain and strengthening/ stretching post core using  the properties of water.  Will transition to land based as tolerated.  OBJECTIVE IMPAIRMENTS: Abnormal gait, decreased activity tolerance, decreased balance, decreased endurance, difficulty walking, decreased strength, obesity, and pain.   ACTIVITY LIMITATIONS: carrying, lifting, bending, sitting, standing, stairs, transfers, and locomotion level  PARTICIPATION LIMITATIONS: meal prep, cleaning, laundry, shopping, community activity, and yard work  PERSONAL FACTORS: Fitness and 1-2 comorbidities: see problem list  are also affecting patient's functional outcome.   REHAB POTENTIAL: Good  CLINICAL DECISION MAKING: Evolving/moderate complexity  EVALUATION  COMPLEXITY: Moderate   GOALS: Goals reviewed with patient? Yes  SHORT TERM GOALS: Target date: 09/05/23  Pt will tolerate full aquatic sessions consistently without increase in pain and with improving function to demonstrate good toleration and effectiveness of intervention.  Baseline: Goal status: INITIAL  2.  Pt will report feeling "looser" in hips and back with stretching submerged (personal goal) Baseline: tight Goal status: INITIAL  3.  Pt will improve on Tug test to <or=13.5 (community dwelling adults) to demonstrate improvement in lower extremity function, mobility and decreased fall risk. Baseline: 15.42 Goal status: INITIAL  LONG TERM GOALS: Target date: 10/11/23  Pt to improve on ODI to below 20% to demonstrate statistically significant Improvement in function. Baseline: 17/45=37% Goal status: INITIAL  2.  Pt will report  a 75% reduction in LBP Baseline:  Goal status: INITIAL  3.  Pt will improve on 5 X STS test to <or= 17s (MDC) to demonstrate improving functional lower extremity strength, transitional movements, and balance Baseline: 21.88 Goal status: INITIAL  4.  Pt will perform full lumbar ROM without pain Baseline: see chart Goal status: INITIAL  5.  Pt will report amb community distances without limitation to pain (up to 2000 ft) with AD as needed Baseline:  Goal status: INITIAL    PLAN:  PT FREQUENCY: 1-2x/week  PT DURATION: 8 weeks  PLANNED INTERVENTIONS: 97164- PT Re-evaluation, 97110-Therapeutic exercises, 97530- Therapeutic activity, 97112- Neuromuscular re-education, 97535- Self Care, 28413- Manual therapy, [phone]- Gait training, [phone]- Orthotic Fit/training, [phone]- Aquatic Therapy, [phone]- Electrical stimulation (unattended), [phone]- Ionotophoresis 4mg /ml Dexamethasone, Patient/Family education, Balance training, Stair training, Taping, Dry Needling, Joint mobilization, DME instructions, Cryotherapy, and Moist heat.  PLAN FOR NEXT SESSION:  aquatics: glut and posterior core strengthening; gait. Balance stretching LE and lumbar spine  Corrie Dandy Tomma Lightning) Gearald Stonebraker MPT 08/29/23 8:11 AM [facility] [address] [place], [place], [address] Phone: [phone]   Fax:  [phone]

## 2023-09-02 ENCOUNTER — Encounter (HOSPITAL_BASED_OUTPATIENT_CLINIC_OR_DEPARTMENT_OTHER): Payer: Self-pay | Admitting: Physical Therapy

## 2023-09-02 ENCOUNTER — Ambulatory Visit (HOSPITAL_BASED_OUTPATIENT_CLINIC_OR_DEPARTMENT_OTHER): Admitting: Physical Therapy

## 2023-09-02 DIAGNOSIS — M6281 Muscle weakness (generalized): Secondary | ICD-10-CM

## 2023-09-02 DIAGNOSIS — R262 Difficulty in walking, not elsewhere classified: Secondary | ICD-10-CM

## 2023-09-02 DIAGNOSIS — M5459 Other low back pain: Secondary | ICD-10-CM | POA: Diagnosis not present

## 2023-09-02 NOTE — Therapy (Signed)
 OUTPATIENT PHYSICAL THERAPY THORACOLUMBAR TREATMENT   Patient Name: Audrey Goodman MRN: [id number] DOB:04-Jan-1971, 53 y.o., female Today's Date: 09/02/2023  END OF SESSION:  PT End of Session - 09/02/23 0818     Visit Number 5    Number of Visits 16    Date for PT Re-Evaluation 10/11/23    Authorization Type uhc    PT Start Time 0800    PT Stop Time 0840    PT Time Calculation (min) 40 min    Activity Tolerance Patient tolerated treatment well    Behavior During Therapy WFL for tasks assessed/performed                Past Medical History:  Diagnosis Date   Anxiety    Arthritis    left shoulder   Back pain    Chronic kidney disease    stage 1   Depression    Diabetes mellitus without complication (HCC)    type 2- on meds   Diverticulitis    High cholesterol    History of chicken pox    Hyperlipidemia    on meds   Hypertension    on meds   Joint pain    Lactose intolerance    PCOS (polycystic ovarian syndrome)    Seasonal allergies    Shingles    Sleep apnea    does NOT use CPAP   SVD (spontaneous vaginal delivery) 1995   x 1   Type 2 diabetes mellitus, uncontrolled 07/30/2006   Qualifier: Diagnosis of  By: Lanier Prude  MD, Cathrine Muster     Past Surgical History:  Procedure Laterality Date   ANKLE FRACTURE SURGERY  05/28/1989   right   CHOLECYSTECTOMY  05/28/2006   COLONOSCOPY  1989   due to blood in stool and diverticulitis per pt   HYSTEROSCOPY N/A 09/13/2014   Procedure: HYSTEROSCOPY with IUD removal;  Surgeon: Mitchel Honour, DO;  Location: [facility];  Service: Gynecology;  Laterality: N/A;  need longest speculum and instruments that aresavailable due to patient's habitus (BMI ~60)   Patient Active Problem List   Diagnosis Date Noted   Snoring 07/10/2023   Insomnia 02/20/2023   Attention deficit hyperactivity disorder, combined type, moderate 03/01/2022   Impacted cerumen of both ears 01/23/2021   Left Achilles tendinitis 06/19/2017   Depression  07/13/2015   Preventative health care 06/23/2014   PCOS (polycystic ovarian syndrome) 09/10/2013   CHOLELITHIASIS 02/20/2008   Hyperlipidemia 08/06/2006   Uncontrolled diabetes mellitus with hyperglycemia (HCC) 07/30/2006   HYPERTENSION, BENIGN ESSENTIAL 07/30/2006   MORBID OBESITY 07/25/2006    PCP: [name] NP  REFERRING PROVIDER:   Madelyn Brunner, DO    REFERRING DIAG:  [phone] (ICD-10-CM) - Foraminal stenosis of lumbar region  M54.42,G89.29 (ICD-10-CM) - Chronic left-sided low back pain with left-sided sciatica    Rationale for Evaluation and Treatment: Rehabilitation  THERAPY DIAG:  Other low back pain  Muscle weakness (generalized)  Difficulty in walking, not elsewhere classified  ONSET DATE: April 2024  SUBJECTIVE:  SUBJECTIVE STATEMENT: "I am starting to feel a little looser.  My pain this morning is only 2/10. Marland Kitchen .Usually much high especially with the rainy weather." POOL ACCESS: currently none.   Initial subjective I was told I had left hip OA but minimal, shouldn't be causing as much pain as I am having.  Had shot in L3 which pulled my pain down (Oct 2024).  Had another in L4 without relief (Jan 2025).  Pain is daily, occasionally waking at night. Started when I began walking more a little over 1 year ago at [employer].  Have been up a down since  PERTINENT HISTORY:  Left shoulder OA, chronic kidney disease, type 2 DM, HLD, right ankle fracture '91, morbid obesity   PAIN:  Are you having pain? Yes: NPRS scale: 2/10 Pain location: lumbar spine with radicular pain into left buttock and anterior left thigh Pain description: ache intermittent Aggravating factors: walking few minutes; sitting > 1 hour Relieving factors: sitting, standing  PRECAUTIONS: None  RED  FLAGS: None   WEIGHT BEARING RESTRICTIONS: No  FALLS:  Has patient fallen in last 6 months? No  LIVING ENVIRONMENT: Lives with: lives alone Lives in: House/apartment Stairs:  Avoids stairs Has following equipment at home: Single point cane  OCCUPATION: Claims specialist, works from home on a computer   PLOF: Independent  PATIENT GOALS: get looser in hips, decrease pain in back, going back to [place] in Dec  NEXT MD VISIT: as needed  OBJECTIVE:  Note: Objective measures were completed at Evaluation unless otherwise noted.  DIAGNOSTIC FINDINGS:  None in chart  PATIENT SURVEYS:  Modified Oswestry 17/45=37%   COGNITION: Overall cognitive status: Within functional limits for tasks assessed     SENSATION: WFL  MUSCLE LENGTH: Hamstrings: WFL tested in sitting    POSTURE: rounded shoulders and increased lumbar lordosis  PALPATION: TTP slight left IT band, lumbar paraspinals  LUMBAR ROM:   AROM eval  Flexion full  Extension Full P!  Right lateral flexion Full P!  Left lateral flexion full  Right rotation   Left rotation    (Blank rows = not tested)  LOWER EXTREMITY ROM:     wfl  LOWER EXTREMITY MMT:    MMT Right eval Left eval  Hip flexion 50.6 53.9  Hip extension    Hip abduction 33.2 37.6  Hip adduction    Hip internal rotation    Hip external rotation    Knee flexion    Knee extension    Ankle dorsiflexion    Ankle plantarflexion    Ankle inversion    Ankle eversion     (Blank rows = not tested)  LUMBAR SPECIAL TESTS:  Straight leg raise test: Negative and Slump test: Negative  FUNCTIONAL TESTS:  5 times sit to stand: 21.88 Timed up and go (TUG): 15.42 4 stage balance: 1&2 passed; tandem x 10s; SLS x2s  GAIT: Distance walked: 400 ft Assistive device utilized: Single point cane Level of assistance: Complete Independence Comments: slight bilateral hip trendelenburg  TREATMENT [facility] Adult PT Treatment:                                                 DATE: 09/02/23 Pt seen for aquatic therapy today.  Treatment took place in water 3.5-4.75 ft in depth at the [facility] pool. Temp of water was 91.  Pt  entered/exited the pool via stairs using step to pattern with hand rail.  *unsupported walking forward/ backward with reciprocal arm swing * side stepping -> with arm abdct/ addct -> with rainbow hand floats * relaxed squat * farmers carry with rainbow ->yellow hand floats - walking forward and back  *L stretch x 2;L stretch with tail wagging * UE on wall: single leg clam x 10-12 each LE,  *Ue support yellow HB: (balance and core strength) df; pf; high knee marching; hip add/abd; hip flex/ext x 10 *1/2 noodle->full hollow noodle pull down (TrA set) 3.8 ft. VC and demonstration for execution. Motor plan established x 8 wide stance then x 5 staggered stances *standing resisted row using rider band elbows bent then straight 2 x 5 ea position  Pt requires the buoyancy and hydrostatic pressure of water for support, and to offload joints by unweighting joint load by at least 50 % in navel deep water and by at least 75-80% in chest to neck deep water.  Viscosity of the water is needed for resistance of strengthening. Water current perturbations provides challenge to standing balance requiring increased core activation.                                                                                           PATIENT EDUCATION:  Education details: issued list of area pools and posture and body mechanics hand out  Person educated: Patient Education method: Explanation Education comprehension: verbalized understanding  HOME EXERCISE PROGRAM: Verbally assigned standing hip ext stretch 20s x 2, and "L" stretch x 20s  ASSESSMENT:  CLINICAL IMPRESSION: Pt has begun meeting STG 2/3 met progressing well. Progressed posterior core strengthening with good toleration. She reports feeling left LB muscle engagement without pain. Good  attitude.       Initial Impression Patient is a 53 y.o. f who was seen today for physical therapy evaluation and treatment for lumbar stenosis/LBP. She does have intermittent radicular pain into her lle to knee with extended walking. Reports reduction of pain after steroid injection a few month ago.  Slump test neg.  Hip flex and abduction strength is fairly good although resistance increases lbp, posterior core strength lacking.  Lumbar ROM also good but experiences pain in LB at end ranges. Her biggest complaint is ability to tolerate amb as pain begins immediately once up and gait initiated and is limited after 5 mins. She does use a cane out of home.  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 She is going to [facility] in December and wants to improve her walking as much as able.  She will benefit from skilled physical therapy intervention beginning in aquatics to initiate reduction and improved management of pain and strengthening/ stretching post core using the properties of water.  Will transition to land based as tolerated.  OBJECTIVE IMPAIRMENTS: Abnormal gait, decreased activity tolerance, decreased balance, decreased endurance, difficulty walking, decreased strength, obesity, and pain.   ACTIVITY LIMITATIONS: carrying, lifting, bending, sitting, standing, stairs, transfers, and locomotion level  PARTICIPATION LIMITATIONS: meal prep, cleaning, laundry, shopping, community activity, and yard work  PERSONAL FACTORS: Fitness and 1-2 comorbidities: see problem  list  are also affecting patient's functional outcome.   REHAB POTENTIAL: Good  CLINICAL DECISION MAKING: Evolving/moderate complexity  EVALUATION COMPLEXITY: Moderate   GOALS: Goals reviewed with patient? Yes  SHORT TERM GOALS: Target date: 09/05/23  Pt will tolerate full aquatic sessions consistently without increase in pain and with improving function to demonstrate good toleration and effectiveness of intervention.  Baseline: Goal  status: Met 09/02/23  2.  Pt will report feeling "looser" in hips and back with stretching submerged (personal goal) Baseline: tight Goal status: Met 09/02/23  3.  Pt will improve on Tug test to <or=13.5 (community dwelling adults) to demonstrate improvement in lower extremity function, mobility and decreased fall risk. Baseline: 15.42 Goal status: INITIAL  LONG TERM GOALS: Target date: 10/11/23  Pt to improve on ODI to below 20% to demonstrate statistically significant Improvement in function. Baseline: 17/45=37% Goal status: INITIAL  2.  Pt will report  a 75% reduction in LBP Baseline:  Goal status: INITIAL  3.  Pt will improve on 5 X STS test to <or= 17s ([facility]) to demonstrate improving functional lower extremity strength, transitional movements, and balance Baseline: 21.88 Goal status: INITIAL  4.  Pt will perform full lumbar ROM without pain Baseline: see chart Goal status: INITIAL  5.  Pt will report amb community distances without limitation to pain (up to 2000 ft) with AD as needed Baseline:  Goal status: INITIAL    PLAN:  PT FREQUENCY: 1-2x/week  PT DURATION: 8 weeks  PLANNED INTERVENTIONS: 97164- PT Re-evaluation, 97110-Therapeutic exercises, 97530- Therapeutic activity, 97112- Neuromuscular re-education, 97535- Self Care, 81191- Manual therapy, [phone]- Gait training, [phone]- Orthotic Fit/training, [phone]- Aquatic Therapy, [phone]- Electrical stimulation (unattended), [phone]- Ionotophoresis 4mg /ml Dexamethasone, Patient/Family education, Balance training, Stair training, Taping, Dry Needling, Joint mobilization, DME instructions, Cryotherapy, and Moist heat.  PLAN FOR NEXT SESSION: aquatics: glut and posterior core strengthening; gait. Balance stretching LE and lumbar spine  Corrie Dandy Tomma Lightning) Thaila Bottoms MPT 09/02/23 8:43 AM [facility] [address] [place], [place], [address] Phone: [phone]   Fax:  [phone]

## 2023-09-03 ENCOUNTER — Telehealth: Admitting: Physician Assistant

## 2023-09-03 ENCOUNTER — Encounter: Payer: Self-pay | Admitting: Internal Medicine

## 2023-09-03 DIAGNOSIS — L239 Allergic contact dermatitis, unspecified cause: Secondary | ICD-10-CM | POA: Diagnosis not present

## 2023-09-03 MED ORDER — PREDNISONE 10 MG PO TABS: ORAL_TABLET | ORAL | 0 refills | Status: AC

## 2023-09-03 NOTE — Progress Notes (Signed)
 I have spent 5 minutes in review of e-visit questionnaire, review and updating patient chart, medical decision making and response to patient.   Piedad Climes, PA-C

## 2023-09-03 NOTE — Progress Notes (Signed)
 E Visit for Rash  We are sorry that you are not feeling well. Here is how we plan to help!  Based on what you shared with me it looks like you have contact dermatitis.  Contact dermatitis is a skin rash caused by something that touches the skin and causes irritation or inflammation.  Your skin may be red, swollen, dry, cracked, and itch.  The rash should go away in a few days but can last a few weeks.  If you get a rash, it's important to figure out what caused it so the irritant can be avoided in the future. and I am prescribing a two week course of steroids (37 tablets of 10 mg prednisone).  Days 1-4 take 4 tablets (40 mg) daily  Days 5-8 take 3 tablets (30 mg) daily, Days 9-11 take 2 tablets (20 mg) daily, Days 12-14 take 1 tablet (10 mg) daily.       HOME CARE:  Take cool showers and avoid direct sunlight. Apply cool compress or wet dressings. Take a bath in an oatmeal bath.  Sprinkle content of one Aveeno packet under running faucet with comfortably warm water.  Bathe for 15-20 minutes, 1-2 times daily.  Pat dry with a towel. Do not rub the rash. Use hydrocortisone cream. Take an antihistamine like Benadryl for widespread rashes that itch.  The adult dose of Benadryl is 25-50 mg by mouth 4 times daily. Caution:  This type of medication may cause sleepiness.  Do not drink alcohol, drive, or operate dangerous machinery while taking antihistamines.  Do not take these medications if you have prostate enlargement.  Read package instructions thoroughly on all medications that you take.  GET HELP RIGHT AWAY IF:  Symptoms don't go away after treatment. Severe itching that persists. If you rash spreads or swells. If you rash begins to smell. If it blisters and opens or develops a yellow-brown crust. You develop a fever. You have a sore throat. You become short of breath.  MAKE SURE YOU:  Understand these instructions. Will watch your condition. Will get help right away if you are not  doing well or get worse.  Thank you for choosing an e-visit.  Your e-visit answers were reviewed by a board certified advanced clinical practitioner to complete your personal care plan. Depending upon the condition, your plan could have included both over the counter or prescription medications.  Please review your pharmacy choice. Make sure the pharmacy is open so you can pick up prescription now. If there is a problem, you may contact your provider through [employer] and have the prescription routed to another pharmacy.  Your safety is important to [name]. If you have drug allergies check your prescription carefully.   For the next 24 hours you can use MyChart to ask questions about today's visit, request a non-urgent call back, or ask for a work or school excuse. You will get an email in the next two days asking about your experience. I hope that your e-visit has been valuable and will speed your recovery.

## 2023-09-05 ENCOUNTER — Encounter (HOSPITAL_BASED_OUTPATIENT_CLINIC_OR_DEPARTMENT_OTHER): Payer: Self-pay | Admitting: Physical Therapy

## 2023-09-05 ENCOUNTER — Ambulatory Visit (HOSPITAL_BASED_OUTPATIENT_CLINIC_OR_DEPARTMENT_OTHER): Admitting: Physical Therapy

## 2023-09-05 DIAGNOSIS — M5459 Other low back pain: Secondary | ICD-10-CM

## 2023-09-05 DIAGNOSIS — M6281 Muscle weakness (generalized): Secondary | ICD-10-CM

## 2023-09-05 DIAGNOSIS — R262 Difficulty in walking, not elsewhere classified: Secondary | ICD-10-CM

## 2023-09-05 NOTE — Therapy (Signed)
 OUTPATIENT PHYSICAL THERAPY THORACOLUMBAR TREATMENT   Patient Name: Audrey Goodman MRN: [id number] DOB:1970-09-04, 52 y.o., female Today's Date: 09/05/2023  END OF SESSION:  PT End of Session - 09/05/23 0815     Visit Number 6    Number of Visits 16    Date for PT Re-Evaluation 10/11/23    Authorization Type UHC    PT Start Time 0805    PT Stop Time 0843    PT Time Calculation (min) 38 min    Activity Tolerance Patient tolerated treatment well    Behavior During Therapy WFL for tasks assessed/performed                Past Medical History:  Diagnosis Date   Anxiety    Arthritis    left shoulder   Back pain    Chronic kidney disease    stage 1   Depression    Diabetes mellitus without complication (HCC)    type 2- on meds   Diverticulitis    High cholesterol    History of chicken pox    Hyperlipidemia    on meds   Hypertension    on meds   Joint pain    Lactose intolerance    PCOS (polycystic ovarian syndrome)    Seasonal allergies    Shingles    Sleep apnea    does NOT use CPAP   SVD (spontaneous vaginal delivery) 1995   x 1   Type 2 diabetes mellitus, uncontrolled 07/30/2006   Qualifier: Diagnosis of  By: Lanier Prude  MD, Cathrine Muster     Past Surgical History:  Procedure Laterality Date   ANKLE FRACTURE SURGERY  05/28/1989   right   CHOLECYSTECTOMY  05/28/2006   COLONOSCOPY  1989   due to blood in stool and diverticulitis per pt   HYSTEROSCOPY N/A 09/13/2014   Procedure: HYSTEROSCOPY with IUD removal;  Surgeon: Mitchel Honour, DO;  Location: [facility];  Service: Gynecology;  Laterality: N/A;  need longest speculum and instruments that aresavailable due to patient's habitus (BMI ~60)   Patient Active Problem List   Diagnosis Date Noted   Snoring 07/10/2023   Insomnia 02/20/2023   Attention deficit hyperactivity disorder, combined type, moderate 03/01/2022   Impacted cerumen of both ears 01/23/2021   Left Achilles tendinitis 06/19/2017   Depression  07/13/2015   Preventative health care 06/23/2014   PCOS (polycystic ovarian syndrome) 09/10/2013   CHOLELITHIASIS 02/20/2008   Hyperlipidemia 08/06/2006   Uncontrolled diabetes mellitus with hyperglycemia (HCC) 07/30/2006   HYPERTENSION, BENIGN ESSENTIAL 07/30/2006   MORBID OBESITY 07/25/2006    PCP: [name] NP  REFERRING PROVIDER:   Madelyn Brunner, DO    REFERRING DIAG:  [phone] (ICD-10-CM) - Foraminal stenosis of lumbar region  M54.42,G89.29 (ICD-10-CM) - Chronic left-sided low back pain with left-sided sciatica    Rationale for Evaluation and Treatment: Rehabilitation  THERAPY DIAG:  Other low back pain  Muscle weakness (generalized)  Difficulty in walking, not elsewhere classified  ONSET DATE: April 2024  SUBJECTIVE:  SUBJECTIVE STATEMENT: Pt reports that she has slowly been moving her furniture back after having carpets cleaned.   POOL ACCESS: has pool in apt complex open Memorial Day to Labor Day  Initial subjective I was told I had left hip OA but minimal, shouldn't be causing as much pain as I am having.  Had shot in L3 which pulled my pain down (Oct 2024).  Had another in L4 without relief (Jan 2025).  Pain is daily, occasionally waking at night. Started when I began walking more a little over 1 year ago at [employer].  Have been up a down since  PERTINENT HISTORY:  Left shoulder OA, chronic kidney disease, type 2 DM, HLD, right ankle fracture '91, morbid obesity   PAIN:  Are you having pain? Yes: NPRS scale: 3/10 Pain location: lumbar spine  Pain description: ache intermittent Aggravating factors: walking few minutes; sitting > 1 hour Relieving factors: sitting, standing  PRECAUTIONS: None  RED FLAGS: None   WEIGHT BEARING RESTRICTIONS: No  FALLS:  Has  patient fallen in last 6 months? No  LIVING ENVIRONMENT: Lives with: lives alone Lives in: House/apartment Stairs:  Avoids stairs Has following equipment at home: Single point cane  OCCUPATION: Claims specialist, works from home on a computer   PLOF: Independent  PATIENT GOALS: get looser in hips, decrease pain in back, going back to [place] in Dec  NEXT MD VISIT: as needed  OBJECTIVE:  Note: Objective measures were completed at Evaluation unless otherwise noted.  DIAGNOSTIC FINDINGS:  None in chart  PATIENT SURVEYS:  Modified Oswestry 17/45=37%   COGNITION: Overall cognitive status: Within functional limits for tasks assessed     SENSATION: WFL  MUSCLE LENGTH: Hamstrings: WFL tested in sitting    POSTURE: rounded shoulders and increased lumbar lordosis  PALPATION: TTP slight left IT band, lumbar paraspinals  LUMBAR ROM:   AROM eval  Flexion full  Extension Full P!  Right lateral flexion Full P!  Left lateral flexion full  Right rotation   Left rotation    (Blank rows = not tested)  LOWER EXTREMITY ROM:     wfl  LOWER EXTREMITY MMT:    MMT Right eval Left eval  Hip flexion 50.6 53.9  Hip extension    Hip abduction 33.2 37.6  Hip adduction    Hip internal rotation    Hip external rotation    Knee flexion    Knee extension    Ankle dorsiflexion    Ankle plantarflexion    Ankle inversion    Ankle eversion     (Blank rows = not tested)  LUMBAR SPECIAL TESTS:  Straight leg raise test: Negative and Slump test: Negative  FUNCTIONAL TESTS:  5 times sit to stand: 21.88 Timed up and go (TUG): 15.42 4 stage balance: 1&2 passed; tandem x 10s; SLS x2s  GAIT: Distance walked: 400 ft Assistive device utilized: Single point cane Level of assistance: Complete Independence Comments: slight bilateral hip trendelenburg  TREATMENT [facility] Adult PT Treatment:                                                DATE: 09/05/23 Pt seen for aquatic therapy today.   Treatment took place in water 3.5-4.75 ft in depth at the [facility] pool. Temp of water was 91.  Pt entered/exited the pool via stairs using step to pattern  with hand rail.  *unsupported walking forward/ backward with reciprocal arm swing, multiple laps  * L stretch at wall  * side stepping -> with arm abdct/ addct -> with rainbow hand floats * L stretch at wall  * farmers carry with single yellow hand floats - marching forward and back  * UE on wall: Hip abdct/ addct x 10 ; hip openers/ crosses x 5 each LE; single leg clam x 10 each LE,  * return to walking unsupported backward/ forward 2 laps  * squats with rainbow -> yellow hand float in 67ft6"  * L stretch * return to walking/ marching  * hip hinge with forward arm reach with yellow hand floats * TrA set with hollow noodle pull down to thighs with alternating knee taps  * staggered stance with kick board push / pull   Pt requires the buoyancy and hydrostatic pressure of water for support, and to offload joints by unweighting joint load by at least 50 % in navel deep water and by at least 75-80% in chest to neck deep water.  Viscosity of the water is needed for resistance of strengthening. Water current perturbations provides challenge to standing balance requiring increased core activation.   PATIENT EDUCATION:  Education details: aquatic therapy exercise progression/ modification  Person educated: Patient Education method: Explanation Education comprehension: verbalized understanding  HOME EXERCISE PROGRAM: Verbally assigned standing hip ext stretch 20s x 2, and "L" stretch x 20s  ASSESSMENT:  CLINICAL IMPRESSION: Pt reported some tightening of lower back muscles with kick board row in staggered stance as well as hip hinge with forward arm reach.  Pt continues to get relief in lower back with L stretch at wall. Pain in back remained 2-3/10 during session.   Therapist to test TUG (STG) next session.        Initial  Impression Patient is a 53 y.o. f who was seen today for physical therapy evaluation and treatment for lumbar stenosis/LBP. She does have intermittent radicular pain into her lle to knee with extended walking. Reports reduction of pain after steroid injection a few month ago.  Slump test neg.  Hip flex and abduction strength is fairly good although resistance increases lbp, posterior core strength lacking.  Lumbar ROM also good but experiences pain in LB at end ranges. Her biggest complaint is ability to tolerate amb as pain begins immediately once up and gait initiated and is limited after 5 mins. She does use a cane out of home.  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 She is going to [facility] in December and wants to improve her walking as much as able.  She will benefit from skilled physical therapy intervention beginning in aquatics to initiate reduction and improved management of pain and strengthening/ stretching post core using the properties of water.  Will transition to land based as tolerated.  OBJECTIVE IMPAIRMENTS: Abnormal gait, decreased activity tolerance, decreased balance, decreased endurance, difficulty walking, decreased strength, obesity, and pain.   ACTIVITY LIMITATIONS: carrying, lifting, bending, sitting, standing, stairs, transfers, and locomotion level  PARTICIPATION LIMITATIONS: meal prep, cleaning, laundry, shopping, community activity, and yard work  PERSONAL FACTORS: Fitness and 1-2 comorbidities: see problem list  are also affecting patient's functional outcome.   REHAB POTENTIAL: Good  CLINICAL DECISION MAKING: Evolving/moderate complexity  EVALUATION COMPLEXITY: Moderate   GOALS: Goals reviewed with patient? Yes  SHORT TERM GOALS: Target date: 09/05/23  Pt will tolerate full aquatic sessions consistently without increase in pain and with improving function  to demonstrate good toleration and effectiveness of intervention.  Baseline: Goal status: Met 09/02/23  2.  Pt  will report feeling "looser" in hips and back with stretching submerged (personal goal) Baseline: tight Goal status: Met 09/02/23  3.  Pt will improve on Tug test to <or=13.5 (community dwelling adults) to demonstrate improvement in lower extremity function, mobility and decreased fall risk. Baseline: 15.42 Goal status: INITIAL  LONG TERM GOALS: Target date: 10/11/23  Pt to improve on ODI to below 20% to demonstrate statistically significant Improvement in function. Baseline: 17/45=37% Goal status: INITIAL  2.  Pt will report  a 75% reduction in LBP Baseline:  Goal status: INITIAL  3.  Pt will improve on 5 X STS test to <or= 17s (MDC) to demonstrate improving functional lower extremity strength, transitional movements, and balance Baseline: 21.88 Goal status: INITIAL  4.  Pt will perform full lumbar ROM without pain Baseline: see chart Goal status: INITIAL  5.  Pt will report amb community distances without limitation to pain (up to 2000 ft) with AD as needed Baseline:  Goal status: INITIAL    PLAN:  PT FREQUENCY: 1-2x/week  PT DURATION: 8 weeks  PLANNED INTERVENTIONS: 97164- PT Re-evaluation, 97110-Therapeutic exercises, 97530- Therapeutic activity, 97112- Neuromuscular re-education, 97535- Self Care, 16109- Manual therapy, [phone]- Gait training, [phone]- Orthotic Fit/training, [phone]- Aquatic Therapy, [phone]- Electrical stimulation (unattended), [phone]- Ionotophoresis 4mg /ml Dexamethasone, Patient/Family education, Balance training, Stair training, Taping, Dry Needling, Joint mobilization, DME instructions, Cryotherapy, and Moist heat.  PLAN FOR NEXT SESSION: aquatics: glut and posterior core strengthening; gait. Balance stretching LE and lumbar spine  Mayer Camel, PTA 09/05/23 8:52 AM [facility] [address] [place], [place], [address] Phone: [phone]   Fax:  [phone]

## 2023-09-08 ENCOUNTER — Encounter: Payer: Self-pay | Admitting: Family

## 2023-09-08 MED ORDER — FLUOXETINE HCL 20 MG PO CAPS: 20.0000 mg | ORAL_CAPSULE | Freq: Every day | ORAL | 1 refills | Status: DC

## 2023-09-09 ENCOUNTER — Ambulatory Visit (HOSPITAL_BASED_OUTPATIENT_CLINIC_OR_DEPARTMENT_OTHER): Admitting: Physical Therapy

## 2023-09-09 MED ORDER — ROSUVASTATIN CALCIUM 20 MG PO TABS: 20.0000 mg | ORAL_TABLET | Freq: Every day | ORAL | 1 refills | Status: DC

## 2023-09-09 NOTE — Addendum Note (Signed)
 Addended by: Dorrene Gaucher on: 09/09/2023 02:19 PM   Modules accepted: Orders

## 2023-09-11 ENCOUNTER — Encounter (HOSPITAL_BASED_OUTPATIENT_CLINIC_OR_DEPARTMENT_OTHER): Payer: Self-pay | Admitting: Physical Therapy

## 2023-09-11 ENCOUNTER — Encounter: Payer: Self-pay | Admitting: Sports Medicine

## 2023-09-11 ENCOUNTER — Ambulatory Visit (INDEPENDENT_AMBULATORY_CARE_PROVIDER_SITE_OTHER): Admitting: Physical Therapy

## 2023-09-11 DIAGNOSIS — M5459 Other low back pain: Secondary | ICD-10-CM | POA: Diagnosis not present

## 2023-09-11 DIAGNOSIS — R262 Difficulty in walking, not elsewhere classified: Secondary | ICD-10-CM

## 2023-09-11 DIAGNOSIS — M6281 Muscle weakness (generalized): Secondary | ICD-10-CM

## 2023-09-11 NOTE — Therapy (Signed)
 OUTPATIENT PHYSICAL THERAPY THORACOLUMBAR TREATMENT   Patient Name: Audrey Goodman MRN: [id number] DOB:August 28, 1970, 53 y.o., female Today's Date: 09/11/2023  END OF SESSION:  PT End of Session - 09/11/23 0730     Visit Number 7    Number of Visits 16    Date for PT Re-Evaluation 10/11/23    Authorization Type UHC    PT Start Time 0716    PT Stop Time 0755    PT Time Calculation (min) 39 min    Activity Tolerance Patient tolerated treatment well    Behavior During Therapy WFL for tasks assessed/performed                 Past Medical History:  Diagnosis Date   Anxiety    Arthritis    left shoulder   Back pain    Chronic kidney disease    stage 1   Depression    Diabetes mellitus without complication (HCC)    type 2- on meds   Diverticulitis    High cholesterol    History of chicken pox    Hyperlipidemia    on meds   Hypertension    on meds   Joint pain    Lactose intolerance    PCOS (polycystic ovarian syndrome)    Seasonal allergies    Shingles    Sleep apnea    does NOT use CPAP   SVD (spontaneous vaginal delivery) 1995   x 1   Type 2 diabetes mellitus, uncontrolled 07/30/2006   Qualifier: Diagnosis of  By: Redia Candela  MD, Roz Cornelia     Past Surgical History:  Procedure Laterality Date   ANKLE FRACTURE SURGERY  05/28/1989   right   CHOLECYSTECTOMY  05/28/2006   COLONOSCOPY  1989   due to blood in stool and diverticulitis per pt   HYSTEROSCOPY N/A 09/13/2014   Procedure: HYSTEROSCOPY with IUD removal;  Surgeon: Dyanna Glasgow, DO;  Location: [facility];  Service: Gynecology;  Laterality: N/A;  need longest speculum and instruments that aresavailable due to patient's habitus (BMI ~60)   Patient Active Problem List   Diagnosis Date Noted   Snoring 07/10/2023   Insomnia 02/20/2023   Attention deficit hyperactivity disorder, combined type, moderate 03/01/2022   Impacted cerumen of both ears 01/23/2021   Left Achilles tendinitis 06/19/2017   Depression  07/13/2015   Preventative health care 06/23/2014   PCOS (polycystic ovarian syndrome) 09/10/2013   CHOLELITHIASIS 02/20/2008   Hyperlipidemia 08/06/2006   Uncontrolled diabetes mellitus with hyperglycemia (HCC) 07/30/2006   HYPERTENSION, BENIGN ESSENTIAL 07/30/2006   MORBID OBESITY 07/25/2006    PCP: Dorrene Gaucher NP  REFERRING PROVIDER:   Shauna Del, DO    REFERRING DIAG:  [phone] (ICD-10-CM) - Foraminal stenosis of lumbar region  M54.42,G89.29 (ICD-10-CM) - Chronic left-sided low back pain with left-sided sciatica    Rationale for Evaluation and Treatment: Rehabilitation  THERAPY DIAG:  Other low back pain  Muscle weakness (generalized)  Difficulty in walking, not elsewhere classified  ONSET DATE: April 2024  SUBJECTIVE:  SUBJECTIVE STATEMENT: Pt reports overall she is ~ 10% improved (pain)   POOL ACCESS: has pool in apt complex open Memorial Day to Labor Day  Initial subjective I was told I had left hip OA but minimal, shouldn't be causing as much pain as I am having.  Had shot in L3 which pulled my pain down (Oct 2024).  Had another in L4 without relief (Jan 2025).  Pain is daily, occasionally waking at night. Started when I began walking more a little over 1 year ago at [employer].  Have been up a down since  PERTINENT HISTORY:  Left shoulder OA, chronic kidney disease, type 2 DM, HLD, right ankle fracture '91, morbid obesity   PAIN:  Are you having pain? Yes: NPRS scale: 3/10 Pain location: lumbar spine  Pain description: ache intermittent Aggravating factors: walking few minutes; sitting > 1 hour Relieving factors: sitting, standing  PRECAUTIONS: None  RED FLAGS: None   WEIGHT BEARING RESTRICTIONS: No  FALLS:  Has patient fallen in last 6 months? No  LIVING  ENVIRONMENT: Lives with: lives alone Lives in: House/apartment Stairs:  Avoids stairs Has following equipment at home: Single point cane  OCCUPATION: Claims specialist, works from home on a computer   PLOF: Independent  PATIENT GOALS: get looser in hips, decrease pain in back, going back to [place] in Dec  NEXT MD VISIT: as needed  OBJECTIVE:  Note: Objective measures were completed at Evaluation unless otherwise noted.  DIAGNOSTIC FINDINGS:  None in chart  PATIENT SURVEYS:  Modified Oswestry 17/45=37%   COGNITION: Overall cognitive status: Within functional limits for tasks assessed     SENSATION: WFL  MUSCLE LENGTH: Hamstrings: WFL tested in sitting    POSTURE: rounded shoulders and increased lumbar lordosis  PALPATION: TTP slight left IT band, lumbar paraspinals  LUMBAR ROM:   AROM eval  Flexion full  Extension Full P!  Right lateral flexion Full P!  Left lateral flexion full  Right rotation   Left rotation    (Blank rows = not tested)  LOWER EXTREMITY ROM:     wfl  LOWER EXTREMITY MMT:    MMT Right eval Left eval  Hip flexion 50.6 53.9  Hip extension    Hip abduction 33.2 37.6  Hip adduction    Hip internal rotation    Hip external rotation    Knee flexion    Knee extension    Ankle dorsiflexion    Ankle plantarflexion    Ankle inversion    Ankle eversion     (Blank rows = not tested)  LUMBAR SPECIAL TESTS:  Straight leg raise test: Negative and Slump test: Negative  FUNCTIONAL TESTS:  5 times sit to stand: 21.88 Timed up and go (TUG): 15.42 4 stage balance: 1&2 passed; tandem x 10s; SLS x2s  GAIT: Distance walked: 400 ft Assistive device utilized: Single point cane Level of assistance: Complete Independence Comments: slight bilateral hip trendelenburg  TREATMENT [facility] Adult PT Treatment:                                                DATE: 09/05/23 Pt seen for aquatic therapy today.  Treatment took place in water 3.5-4.75 ft in  depth at the [facility] pool. Temp of water was 91.  Pt entered/exited the pool via stairs using step to pattern with hand rail.  *unsupported walking  forward/ backward with reciprocal arm swing, multiple laps  * L stretch at wall  * side stepping -> with arm abdct/ addct -> with rainbow hand floats * L stretch at wall  * farmers carry with single yellow hand floats - marching forward and back  * hip hinge with forward arm reach with yellow hand floats 2 x 5 *resisted row using rider band 2 x 10  * UE on wall: Hip abdct/ addct x 10 ; hip openers/ crosses x 5 each LE; single leg clam x 10 each LE,  * return to walking unsupported backward/ forward 2 laps  * squats with rainbow -> yellow hand float in 57ft6"  * L stretch   * TrA set with hollow noodle pull down to thighs with alternating knee taps  * staggered stance with kick board push / pull   Pt requires the buoyancy and hydrostatic pressure of water for support, and to offload joints by unweighting joint load by at least 50 % in navel deep water and by at least 75-80% in chest to neck deep water.  Viscosity of the water is needed for resistance of strengthening. Water current perturbations provides challenge to standing balance requiring increased core activation.   PATIENT EDUCATION:  Education details: aquatic therapy exercise progression/ modification  Person educated: Patient Education method: Explanation Education comprehension: verbalized understanding  HOME EXERCISE PROGRAM: Verbally assigned standing hip ext stretch 20s x 2, and "L" stretch x 20s  ASSESSMENT:  CLINICAL IMPRESSION: Some increase in pain last few days pt attributes to colder weather.  Vc and demonstration as well as instruction on mindfulness today with hip hinging for posterior core engagement, posture and positioning.  Good understanding and execution.  Pt able to balance using noodle in decompression position and cycle which reduces pain.TUG to  be completed next session as time allows. Trial of prone position on noodle for stretching.  Pt reports stretch mid to upper thoracis area. Pt is making slow but good progress towards goals. Pain reduction to 3/10 upon completion.         Initial Impression Patient is a 53 y.o. f who was seen today for physical therapy evaluation and treatment for lumbar stenosis/LBP. She does have intermittent radicular pain into her lle to knee with extended walking. Reports reduction of pain after steroid injection a few month ago.  Slump test neg.  Hip flex and abduction strength is fairly good although resistance increases lbp, posterior core strength lacking.  Lumbar ROM also good but experiences pain in LB at end ranges. Her biggest complaint is ability to tolerate amb as pain begins immediately once up and gait initiated and is limited after 5 mins. She does use a cane out of home.  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 She is going to [facility] in December and wants to improve her walking as much as able.  She will benefit from skilled physical therapy intervention beginning in aquatics to initiate reduction and improved management of pain and strengthening/ stretching post core using the properties of water.  Will transition to land based as tolerated.  OBJECTIVE IMPAIRMENTS: Abnormal gait, decreased activity tolerance, decreased balance, decreased endurance, difficulty walking, decreased strength, obesity, and pain.   ACTIVITY LIMITATIONS: carrying, lifting, bending, sitting, standing, stairs, transfers, and locomotion level  PARTICIPATION LIMITATIONS: meal prep, cleaning, laundry, shopping, community activity, and yard work  PERSONAL FACTORS: Fitness and 1-2 comorbidities: see problem list  are also affecting patient's functional outcome.   REHAB POTENTIAL:  Good  CLINICAL DECISION MAKING: Evolving/moderate complexity  EVALUATION COMPLEXITY: Moderate   GOALS: Goals reviewed with patient? Yes  SHORT  TERM GOALS: Target date: 09/05/23  Pt will tolerate full aquatic sessions consistently without increase in pain and with improving function to demonstrate good toleration and effectiveness of intervention.  Baseline: Goal status: Met 09/02/23  2.  Pt will report feeling "looser" in hips and back with stretching submerged (personal goal) Baseline: tight Goal status: Met 09/02/23  3.  Pt will improve on Tug test to <or=13.5 (community dwelling adults) to demonstrate improvement in lower extremity function, mobility and decreased fall risk. Baseline: 15.42 Goal status: INITIAL  LONG TERM GOALS: Target date: 10/11/23  Pt to improve on ODI to below 20% to demonstrate statistically significant Improvement in function. Baseline: 17/45=37% Goal status: INITIAL  2.  Pt will report  a 75% reduction in LBP Baseline:  Goal status: INITIAL  3.  Pt will improve on 5 X STS test to <or= 17s (MDC) to demonstrate improving functional lower extremity strength, transitional movements, and balance Baseline: 21.88 Goal status: INITIAL  4.  Pt will perform full lumbar ROM without pain Baseline: see chart Goal status: INITIAL  5.  Pt will report amb community distances without limitation to pain (up to 2000 ft) with AD as needed Baseline:  Goal status: INITIAL    PLAN:  PT FREQUENCY: 1-2x/week  PT DURATION: 8 weeks  PLANNED INTERVENTIONS: 97164- PT Re-evaluation, 97110-Therapeutic exercises, 97530- Therapeutic activity, 97112- Neuromuscular re-education, 97535- Self Care, 29528- Manual therapy, [phone]- Gait training, [phone]- Orthotic Fit/training, [phone]- Aquatic Therapy, [phone]- Electrical stimulation (unattended), [phone]- Ionotophoresis 4mg /ml Dexamethasone, Patient/Family education, Balance training, Stair training, Taping, Dry Needling, Joint mobilization, DME instructions, Cryotherapy, and Moist heat.  PLAN FOR NEXT SESSION: aquatics: glut and posterior core strengthening; gait. Balance stretching LE  and lumbar spine  Adriana Hopping Laneta Pintos) Zebulun Deman MPT 09/11/23 7:38 AM [facility] [address] [place], [place], [address] Phone: [phone]   Fax:  [phone]

## 2023-09-12 ENCOUNTER — Ambulatory Visit: Payer: 59 | Admitting: Internal Medicine

## 2023-09-12 ENCOUNTER — Encounter: Payer: Self-pay | Admitting: Internal Medicine

## 2023-09-12 VITALS — BP 118/74 | HR 69 | Ht 65.0 in | Wt 262.0 lb

## 2023-09-12 DIAGNOSIS — E1165 Type 2 diabetes mellitus with hyperglycemia: Secondary | ICD-10-CM

## 2023-09-12 DIAGNOSIS — Z7984 Long term (current) use of oral hypoglycemic drugs: Secondary | ICD-10-CM

## 2023-09-12 DIAGNOSIS — E282 Polycystic ovarian syndrome: Secondary | ICD-10-CM | POA: Diagnosis not present

## 2023-09-12 DIAGNOSIS — E785 Hyperlipidemia, unspecified: Secondary | ICD-10-CM | POA: Diagnosis not present

## 2023-09-12 DIAGNOSIS — Z794 Long term (current) use of insulin: Secondary | ICD-10-CM

## 2023-09-12 LAB — MICROALBUMIN / CREATININE URINE RATIO

## 2023-09-12 NOTE — Progress Notes (Signed)
 Patient ID: Audrey Goodman, female   DOB: 1970-09-10, 53 y.o.   MRN: [id number]   HPI: Audrey Goodman is a 53 y.o.-year-old female, returning for f/u for DM2, dx with GDM in 1995, then DM 6 mo after son was born, insulin-dependent since ~2010, controlled, without long-term complications and PCOS. Last visit 6 months ago.  Interim history: She was previously seen with weight management clinic at [facility] - nonsurgical weight loss program.  She lost 20 pounds on their diet but could not be continue due to price.  She lost 10 pounds afterwards, 5 before last visit.  She gained 8 pounds since then -now on prednisone. No increased urination, blurry vision, nausea, chest pain.  Before last visit, she had steroid p.o. and injections for sciatica and also in hip. Last steroid inj's 05/2023. She is on Prednisone for a rash (contact dermatitis from dry wall) - 20 mg daily now.  Reviewed HbA1c levels: Lab Results  Component Value Date   HGBA1C 7.1 (H) 08/23/2023   HGBA1C 7.2 (H) 02/20/2023   HGBA1C 6.3 (A) 09/28/2021  06/07/2022: HbA1c 7.2% 05/31/2021: HbA1c 7.0%  She is on: - Metformin ER 750 mg 2x a day - Farxiga 10 mg in am, before b'fast - Lantus 20 units at bedtime >> split 10 units 2x day - Novolog 8-15 >> now 10-20 units before meals. Prev.: 8-10 units for a smaller meal  12-15 units for a larger meal - Trulicity 4.5 mg weekly >> Wegovy 2.4 mg weekly Of note, she could not tolerate Jardiance 10 (started 01/2018) because of increased urination.  Pt checks her sugars >4x a day:   Previously:   Lowest sugar was  54 >> 84 >> 50s; she has hypoglycemia awareness in the 80s. Highest sugar was 501 (streroid) >> .Aaron Aas.288 >> 201 >> 200s.  + CKD with proteinuria-sees nephrology (Dr. Christianne Cowper), last BUN/creatinine:  Lab Results  Component Value Date   BUN 20 08/23/2023   CREATININE 0.86 08/23/2023  She is off lisinopril.  + HL; last set of lipids: Lab Results  Component Value Date   CHOL  188 08/23/2023   HDL 56.90 08/23/2023   LDLCALC 109 (H) 08/23/2023   TRIG 111.0 08/23/2023   CHOLHDL 3 08/23/2023  06/16/2019: 143/98/44/82 She had leg cramps with Crestor 10. Started drinking more water >> helped.  - last eye exam was: 01/21/2023-no DR; Dr. Candi Chafe.  - no numbness and tingling in her feet.  Last foot exam 09/28/2021. On Gabapentin.  Latest TSH: 06/16/2019: 3.279  PCOS: -She had irregular menses since menarche -+1 pregnancies, no miscarriages - was on Mirena IUD - for ~4.5 years >> then on Sprintec since 2016 >> then Orthocycen >> now Nexplanon since 2020 - She continues on metformin ER 750 mg twice a day and spironolactone 50 mg twice a day.  She feels that this helps with her hirsutism.  Latest potassium level is normal: Lab Results  Component Value Date   K 4.8 08/23/2023   ROS: + see HPI  I reviewed pt's medications, allergies, PMH, social hx, family hx, and changes were documented in the history of present illness. Otherwise, unchanged from my initial visit note.  Past Medical History:  Diagnosis Date   Anxiety    Arthritis    left shoulder   Back pain    Chronic kidney disease    stage 1   Depression    Diabetes mellitus without complication (HCC)    type 2- on meds   Diverticulitis  High cholesterol    History of chicken pox    Hyperlipidemia    on meds   Hypertension    on meds   Joint pain    Lactose intolerance    PCOS (polycystic ovarian syndrome)    Seasonal allergies    Shingles    Sleep apnea    does NOT use CPAP   SVD (spontaneous vaginal delivery) 1995   x 1   Type 2 diabetes mellitus, uncontrolled 07/30/2006   Qualifier: Diagnosis of  By: Lanier Prude  MD, Cathrine Muster     Past Surgical History:  Procedure Laterality Date   ANKLE FRACTURE SURGERY  05/28/1989   right   CHOLECYSTECTOMY  05/28/2006   COLONOSCOPY  1989   due to blood in stool and diverticulitis per pt   HYSTEROSCOPY N/A 09/13/2014   Procedure: HYSTEROSCOPY with IUD  removal;  Surgeon: Mitchel Honour, DO;  Location: [facility];  Service: Gynecology;  Laterality: N/A;  need longest speculum and instruments that aresavailable due to patient's habitus (BMI ~60)   Social History   Socioeconomic History   Marital status: Single    Spouse name: Not on file   Number of children: Not on file   Years of education: Not on file   Highest education level: Bachelor's degree (e.g., BA, AB, BS)  Occupational History   Not on file  Tobacco Use   Smoking status: Never   Smokeless tobacco: Never  Vaping Use   Vaping status: Never Used  Substance and Sexual Activity   Alcohol use: No    Alcohol/week: 0.0 standard drinks of alcohol   Drug use: No   Sexual activity: Yes    Partners: Male    Birth control/protection: Pill  Other Topics Concern   Not on file  Social History Narrative   Single- lives alone   Son 21- senior at [employer]   Enjoys reading   Works as a Advertising account planner at [employer] Strain: Medium Risk (08/16/2023)   Overall Financial Resource Strain (CARDIA)    Difficulty of Paying Living Expenses: Somewhat hard  Food Insecurity: No Food Insecurity (08/16/2023)   Hunger Vital Sign    Worried About Running Out of Food in the Last Year: Never true    Ran Out of Food in the Last Year: Never true  Transportation Needs: No Transportation Needs (08/16/2023)   PRAPARE - Administrator, Civil Service (Medical): No    Lack of Transportation (Non-Medical): No  Physical Activity: Unknown (08/16/2023)   Exercise Vital Sign    Days of Exercise per Week: 0 days    Minutes of Exercise per Session: Not on file  Stress: Stress Concern Present (08/16/2023)   [employer] of Occupational Health - Occupational Stress Questionnaire    Feeling of Stress : To some extent  Social Connections: Socially Isolated (08/16/2023)   Social Connection and Isolation Panel [NHANES]    Frequency of Communication with  Friends and Family: More than three times a week    Frequency of Social Gatherings with Friends and Family: Never    Attends Religious Services: Never    Database administrator or Organizations: No    Attends Engineer, structural: Not on file    Marital Status: Never married  Intimate Partner Violence: Not on file   Current Outpatient Medications  Medication Sig Dispense Refill   Continuous Glucose Sensor (DEXCOM G7 SENSOR) MISC SMARTSIG:1 As Directed  Fluoxetine HCl, PMDD, 10 MG TABS Take by mouth.     Phentermine HCl (LOMAIRA) 8 MG TABS Take by mouth.     traMADol (ULTRAM) 50 MG tablet SMARTSIG:1 Tablet(s) By Mouth Every 12 Hours     amLODipine (NORVASC) 5 MG tablet Take 1 tablet (5 mg total) by mouth daily. 90 tablet 0   amphetamine-dextroamphetamine (ADDERALL XR) 30 MG 24 hr capsule Take 1 capsule (30 mg total) by mouth every morning. 30 capsule 0   celecoxib (CELEBREX) 100 MG capsule TAKE 1 CAPSULE BY MOUTH TWICE A DAY 60 capsule 0   cetirizine (ZYRTEC) 10 MG tablet Take 10 mg by mouth daily.     dapagliflozin propanediol (FARXIGA) 10 MG TABS tablet Take 1 tablet (10 mg total) by mouth daily. 90 tablet 1   FLUoxetine (PROZAC) 20 MG capsule Take 1 capsule (20 mg total) by mouth daily. 90 capsule 1   fluticasone (FLONASE) 50 MCG/ACT nasal spray Place 2 sprays into both nostrils daily. 48 g 1   gabapentin (NEURONTIN) 300 MG capsule Take by mouth.     insulin aspart (NOVOLOG FLEXPEN) 100 UNIT/ML FlexPen Inject 12-15 Units into the skin 3 (three) times daily with meals. INJECT 12-15 UNITS (DEPENDING ON MEAL SIZE) UNDER THE SKIN 3 TIMES A DAYINJECT 12-15 UNITS (DEPENDING ON MEAL SIZE) UNDER THE SKIN 3 TIMES A DAY 45 mL 3   insulin glargine (LANTUS SOLOSTAR) 100 UNIT/ML Solostar Pen Inject 10 Units into the skin daily.     Insulin Pen Needle 32G X 4 MM MISC Use to inject insulin 4 times daily 400 each 3   metFORMIN (GLUCOPHAGE-XR) 750 MG 24 hr tablet TAKE 1 TABLET BY MOUTH 2 TIMES  DAILY. 180 tablet 3   Multiple Vitamin (MULTIVITAMIN PO) Take 1 tablet by mouth daily at 6 (six) AM.     predniSONE (DELTASONE) 10 MG tablet Take 4 tablets (40 mg total) by mouth daily with breakfast for 4 days, THEN 3 tablets (30 mg total) daily with breakfast for 4 days, THEN 2 tablets (20 mg total) daily with breakfast for 3 days, THEN 1 tablet (10 mg total) daily with breakfast for 3 days. 37 tablet 0   rosuvastatin (CRESTOR) 20 MG tablet Take 1 tablet (20 mg total) by mouth daily. 90 tablet 1   Semaglutide-Weight Management 2.4 MG/0.75ML SOAJ Inject 2.4 mg into the skin once a week. 9 mL 3   spironolactone (ALDACTONE) 50 MG tablet Take 1 tablet (50 mg total) by mouth 2 (two) times daily. 180 tablet 3   No current facility-administered medications for this visit.    Allergies  Allergen Reactions   Lisinopril Swelling    Swelling of the left side of tongue   Lipitor [Atorvastatin] Other (See Comments)    Myalgia/foot cramping   Family History  Problem Relation Age of Onset   Obesity Mother    Colon polyps Mother    Cancer Mother        breast   Hyperlipidemia Mother    Heart disease Mother    Hypertension Mother    Mental illness Mother        depression   Diabetes Mother    Alcoholism Father    Hyperlipidemia Father    Stroke Father    Hypertension Father    Cancer Maternal Grandmother        colon   Diabetes Maternal Grandmother    Diabetes Maternal Aunt    Glaucoma Maternal Aunt    Colon polyps Maternal Uncle  Colon cancer Maternal Uncle    Diabetes Maternal Uncle    Diabetes Maternal Uncle    Esophageal cancer Neg Hx    Rectal cancer Neg Hx    Stomach cancer Neg Hx    PE: BP 118/74 (BP Location: Right Arm, Patient Position: Sitting, Cuff Size: Normal)   Pulse 69   Ht 5\' 5"  (1.651 m)   Wt 262 lb (118.8 kg)   SpO2 98%   BMI 43.60 kg/m  Wt Readings from Last 15 Encounters:  09/12/23 262 lb (118.8 kg)  08/23/23 260 lb (117.9 kg)  07/09/23 262 lb (118.8  kg)  03/18/23 254 lb 9.6 oz (115.5 kg)  02/20/23 259 lb (117.5 kg)  07/16/22 255 lb (115.7 kg)  06/15/22 265 lb (120.2 kg)  04/13/22 266 lb (120.7 kg)  03/16/22 277 lb (125.6 kg)  09/28/21 270 lb 9.6 oz (122.7 kg)  05/31/21 275 lb (124.7 kg)  01/23/21 281 lb (127.5 kg)  12/21/20 281 lb (127.5 kg)  12/13/20 281 lb (127.5 kg)  12/02/20 288 lb (130.6 kg)   Constitutional: overweight, in NAD. Walks with a cane. Eyes:  EOMI, no exophthalmos ENT: no neck masses, no cervical lymphadenopathy Cardiovascular: RRR, No MRG Respiratory: CTA B Musculoskeletal: no deformities Skin:no rashes Neurological: no tremor with outstretched hands Diabetic Foot Exam - Simple   Simple Foot Form Diabetic Foot exam was performed with the following findings: Yes 09/12/2023  8:39 AM  Visual Inspection No deformities, no ulcerations, no other skin breakdown bilaterally: Yes Sensation Testing Intact to touch and monofilament testing bilaterally: Yes Pulse Check Posterior Tibialis and Dorsalis pulse intact bilaterally: Yes Comments    ASSESSMENT: 1. DM2, insulin-dependent, controlled, without long-term complications, but with hyperglycemia  2. PCOS Component     Latest Ref Rng 10/16/2013  Testosterone     10 - 70 ng/dL 54  Sex Hormone Binding     18 - 114 nmol/L 12 (L)  Testosterone Free     0.6 - 6.8 pg/mL 15.5 (H)  Testosterone-% Free     0.4 - 2.4 % 2.9 (H)  TSH     0.35 - 4.50 uIU/mL 0.54  17-OH-Progesterone, LC/MS/MS      <8  Free T4     0.60 - 1.60 ng/dL 1.61  T3, Free     2.3 - 4.2 pg/mL 3.0   Labs confirmed PCOS (high testosterone, low 17-HO Prog, normal TFTs).  LH and FSH were not tested as she was on the Mirena IUD.  3. HL  PLAN:  1. DM2 - Patient with longstanding, reasonably controlled type 2 diabetes, on metformin, SGLT2 inhibitor weekly GLP-1 receptor agonist and basal-bolus insulin, with improving control.  Latest HbA1c was 7.1% last month, decreased from 7.2%. -At last  visit, reviewing the CGM trends, sugars appears to be fluctuating mainly within the target range but with more pronounced hyperglycemic spikes after lunch and less so after the rest of the meals.  She also had some low blood sugars in the afternoon so we discussed about reducing her NovoLog doses before meals, especially before dinner.  We discussed about Mounjaro at last visit but decided to continue with [facility], for which a PA was approved CGM interpretation: -At today's visit, we reviewed her CGM downloads: It appears that 68% of values are in target range (goal >70%), while 32% are higher than 180 (goal <25%), and 0% are lower than 70 (goal <4%).  The calculated average blood sugar is 160.  The projected HbA1c for the next  3 months (GMI) is 7.1%. -Reviewing the CGM trends, sugars are higher in the last 2 weeks c/w the prev. 2 weeks (on Prednsione). She already adjusted the doses of her insulin by splitting Lantus in 2 equal doses and increasing her NovoLog.  I advised her to continue with the schedule until she finishes prednisone.  However, her rash is now better and I advised her to try to discuss with the prescribing physician if she can reduce the dose of prednisone to half.  When coming off prednisone, I advised her to try to go back to once a day Lantus and she may have a need to decrease the dose if she starts having low blood sugars during the night.  We will not change the rest of the regimen for now.  Farxiga is expensive for her, but we discussed that this could be due to her deductible.  We may need to send Jardiance to her pharmacy in the future. -  I suggested to:  Patient Instructions  Please continue: - Metformin ER 1500 mg with dinner - Wegovy 2.4 mg weekly - Farxiga 10 mg in am, before b'fast - Lantus 10 units 2x a day or 16-20 units at bedtime (after you finish Prednisone) - Novolog 10-20 units before meals  Please return in 4 months.  - advised to check sugars at different  times of the day - 4x a day, rotating check times - advised for yearly eye exams >> she is UTD - return to clinic in 4 months  2. PCOS -Patient with clinically evident PCOS, confirmed biochemically in 2015, now perimenopausal -She was on oral contraceptives (Ortho-Cyclen), tolerated well >> then Nexplanon -She continues on metformin ER and spironolactone.  She had a slightly high potassium in 2019 while on a potassium supplement, which we stopped.  He is also on an SGLT2 inhibitor, which can raise potassium.  Latest potassium was reviewed and this was normal: Lab Results  Component Value Date   K 4.8 08/23/2023  - She decided against weight loss surgery - She continues on Wegovy, at maximum dose, with good tolerance  3. HL - Latest lipid panel is from 07/2023-fractions at goal with the exception of a slightly high LDL: Lab Results  Component Value Date   CHOL 188 08/23/2023   HDL 56.90 08/23/2023   LDLCALC 109 (H) 08/23/2023   TRIG 111.0 08/23/2023   CHOLHDL 3 08/23/2023  - She continues Crestor 10 mg daily.  She had muscle cramps in the past but these resolved with better hydration  Emilie Harden, MD PhD [facility]

## 2023-09-12 NOTE — Patient Instructions (Addendum)
 Please continue: - Metformin ER 1500 mg with dinner - Wegovy 2.4 mg weekly - Farxiga 10 mg in am, before b'fast - Lantus 10 units 2x a day or 16-20 units at bedtime (after you finish Prednisone) - Novolog 10-20 units before meals  Please return in 4 months.

## 2023-09-15 ENCOUNTER — Other Ambulatory Visit: Payer: Self-pay | Admitting: Family

## 2023-09-15 DIAGNOSIS — F902 Attention-deficit hyperactivity disorder, combined type: Secondary | ICD-10-CM

## 2023-09-16 ENCOUNTER — Ambulatory Visit (HOSPITAL_BASED_OUTPATIENT_CLINIC_OR_DEPARTMENT_OTHER): Admitting: Physical Therapy

## 2023-09-16 MED ORDER — AMPHETAMINE-DEXTROAMPHET ER 30 MG PO CP24: 30.0000 mg | ORAL_CAPSULE | ORAL | 0 refills | Status: DC

## 2023-09-16 NOTE — Telephone Encounter (Signed)
 Requesting: Adderall XR 30mg  Contract: No UDS: 02/20/23 Last Visit: 08/23/2023 Next Visit: 12/20/2023 Last Refill: 08/12/23  Please Advise

## 2023-09-17 ENCOUNTER — Ambulatory Visit (HOSPITAL_BASED_OUTPATIENT_CLINIC_OR_DEPARTMENT_OTHER): Admitting: Physical Therapy

## 2023-09-17 ENCOUNTER — Encounter (HOSPITAL_BASED_OUTPATIENT_CLINIC_OR_DEPARTMENT_OTHER): Payer: Self-pay | Admitting: Physical Therapy

## 2023-09-17 DIAGNOSIS — M6281 Muscle weakness (generalized): Secondary | ICD-10-CM

## 2023-09-17 DIAGNOSIS — R262 Difficulty in walking, not elsewhere classified: Secondary | ICD-10-CM

## 2023-09-17 DIAGNOSIS — M5459 Other low back pain: Secondary | ICD-10-CM | POA: Diagnosis not present

## 2023-09-17 NOTE — Therapy (Signed)
 OUTPATIENT PHYSICAL THERAPY THORACOLUMBAR TREATMENT   Patient Name: Audrey Goodman MRN: [id number] DOB:22-Sep-1970, 53 y.o., female Today's Date: 09/17/2023  END OF SESSION:  PT End of Session - 09/17/23 1301     Visit Number 8    Number of Visits 16    Date for PT Re-Evaluation 10/11/23    Authorization Type UHC    PT Start Time 1301    PT Stop Time 1345    PT Time Calculation (min) 44 min    Activity Tolerance Patient tolerated treatment well    Behavior During Therapy WFL for tasks assessed/performed                 Past Medical History:  Diagnosis Date   Anxiety    Arthritis    left shoulder   Back pain    Chronic kidney disease    stage 1   Depression    Diabetes mellitus without complication (HCC)    type 2- on meds   Diverticulitis    High cholesterol    History of chicken pox    Hyperlipidemia    on meds   Hypertension    on meds   Joint pain    Lactose intolerance    PCOS (polycystic ovarian syndrome)    Seasonal allergies    Shingles    Sleep apnea    does NOT use CPAP   SVD (spontaneous vaginal delivery) 1995   x 1   Type 2 diabetes mellitus, uncontrolled 07/30/2006   Qualifier: Diagnosis of  By: Redia Candela  MD, Roz Cornelia     Past Surgical History:  Procedure Laterality Date   ANKLE FRACTURE SURGERY  05/28/1989   right   CHOLECYSTECTOMY  05/28/2006   COLONOSCOPY  1989   due to blood in stool and diverticulitis per pt   HYSTEROSCOPY N/A 09/13/2014   Procedure: HYSTEROSCOPY with IUD removal;  Surgeon: Dyanna Glasgow, DO;  Location: [facility];  Service: Gynecology;  Laterality: N/A;  need longest speculum and instruments that aresavailable due to patient's habitus (BMI ~60)   Patient Active Problem List   Diagnosis Date Noted   Snoring 07/10/2023   Insomnia 02/20/2023   Attention deficit hyperactivity disorder, combined type, moderate 03/01/2022   Impacted cerumen of both ears 01/23/2021   Left Achilles tendinitis 06/19/2017   Depression  07/13/2015   Preventative health care 06/23/2014   PCOS (polycystic ovarian syndrome) 09/10/2013   CHOLELITHIASIS 02/20/2008   Hyperlipidemia 08/06/2006   Uncontrolled diabetes mellitus with hyperglycemia (HCC) 07/30/2006   HYPERTENSION, BENIGN ESSENTIAL 07/30/2006   MORBID OBESITY 07/25/2006    PCP: Dorrene Gaucher NP  REFERRING PROVIDER:   Shauna Del, DO    REFERRING DIAG:  [phone] (ICD-10-CM) - Foraminal stenosis of lumbar region  M54.42,G89.29 (ICD-10-CM) - Chronic left-sided low back pain with left-sided sciatica    Rationale for Evaluation and Treatment: Rehabilitation  THERAPY DIAG:  Other low back pain  Muscle weakness (generalized)  Difficulty in walking, not elsewhere classified  ONSET DATE: April 2024  SUBJECTIVE:  SUBJECTIVE STATEMENT: Pt reports tolerating back better. Not as much pain but not pain free. Wants to decrease pain, improve endurance, balance strength, activity tolerance.   POOL ACCESS: has pool in apt complex open Memorial Day to Labor Day  Initial subjective I was told I had left hip OA but minimal, shouldn't be causing as much pain as I am having.  Had shot in L3 which pulled my pain down (Oct 2024).  Had another in L4 without relief (Jan 2025).  Pain is daily, occasionally waking at night. Started when I began walking more a little over 1 year ago at [employer].  Have been up a down since  PERTINENT HISTORY:  Left shoulder OA, chronic kidney disease, type 2 DM, HLD, right ankle fracture '91, morbid obesity   PAIN:  Are you having pain? Yes: NPRS scale: 3/10 Pain location: lumbar spine  Pain description: ache intermittent Aggravating factors: walking few minutes; sitting > 1 hour Relieving factors: sitting, standing  PRECAUTIONS: None  RED  FLAGS: None   WEIGHT BEARING RESTRICTIONS: No  FALLS:  Has patient fallen in last 6 months? No  LIVING ENVIRONMENT: Lives with: lives alone Lives in: House/apartment Stairs:  Avoids stairs Has following equipment at home: Single point cane  OCCUPATION: Claims specialist, works from home on a computer   PLOF: Independent  PATIENT GOALS: get looser in hips, decrease pain in back, going back to [place] in Dec  NEXT MD VISIT: as needed  OBJECTIVE:  Note: Objective measures were completed at Evaluation unless otherwise noted.  DIAGNOSTIC FINDINGS:  None in chart  PATIENT SURVEYS:  Modified Oswestry 17/45=37%   COGNITION: Overall cognitive status: Within functional limits for tasks assessed     SENSATION: WFL  MUSCLE LENGTH: Hamstrings: WFL tested in sitting    POSTURE: rounded shoulders and increased lumbar lordosis  PALPATION: TTP slight left IT band, lumbar paraspinals  LUMBAR ROM:   AROM eval  Flexion full  Extension Full P!  Right lateral flexion Full P!  Left lateral flexion full  Right rotation   Left rotation    (Blank rows = not tested)  LOWER EXTREMITY ROM:     wfl  LOWER EXTREMITY MMT:    MMT Right eval Left eval  Hip flexion 50.6 53.9  Hip extension    Hip abduction 33.2 37.6  Hip adduction    Hip internal rotation    Hip external rotation    Knee flexion    Knee extension    Ankle dorsiflexion    Ankle plantarflexion    Ankle inversion    Ankle eversion     (Blank rows = not tested)  LUMBAR SPECIAL TESTS:  Straight leg raise test: Negative and Slump test: Negative  FUNCTIONAL TESTS:  5 times sit to stand: 21.88 Timed up and go (TUG): 15.42 4 stage balance: 1&2 passed; tandem x 10s; SLS x2s  GAIT: Distance walked: 400 ft Assistive device utilized: Single point cane Level of assistance: Complete Independence Comments: slight bilateral hip trendelenburg  TREATMENT 09/17/23 LTR 10 x 5 second holds Ab set 5 x 5 second  holds March with ab set 3 x 10 Bridge 2 x 10  Standing hip abduction 2 x 10  Standing hip extension 2 x 10 Lateral stepping 6 x 10 feet Seated trunk flexion stretch with green ball 2 x 10 x 5 second holds Nustep seat 7 level 3 5 minutes for conditioning  [facility] Adult PT Treatment:  DATE: 09/05/23 Pt seen for aquatic therapy today.  Treatment took place in water 3.5-4.75 ft in depth at the [facility] pool. Temp of water was 91.  Pt entered/exited the pool via stairs using step to pattern with hand rail.  *unsupported walking forward/ backward with reciprocal arm swing, multiple laps  * L stretch at wall  * side stepping -> with arm abdct/ addct -> with rainbow hand floats * L stretch at wall  * farmers carry with single yellow hand floats - marching forward and back  * hip hinge with forward arm reach with yellow hand floats 2 x 5 *resisted row using rider band 2 x 10  * UE on wall: Hip abdct/ addct x 10 ; hip openers/ crosses x 5 each LE; single leg clam x 10 each LE,  * return to walking unsupported backward/ forward 2 laps  * squats with rainbow -> yellow hand float in 70ft6"  * L stretch   * TrA set with hollow noodle pull down to thighs with alternating knee taps  * staggered stance with kick board push / pull   Pt requires the buoyancy and hydrostatic pressure of water for support, and to offload joints by unweighting joint load by at least 50 % in navel deep water and by at least 75-80% in chest to neck deep water.  Viscosity of the water is needed for resistance of strengthening. Water current perturbations provides challenge to standing balance requiring increased core activation.   PATIENT EDUCATION:  Education details: aquatic therapy exercise progression/ modification  Person educated: Patient Education method: Explanation Education comprehension: verbalized understanding  HOME EXERCISE PROGRAM: Verbally assigned  standing hip ext stretch 20s x 2, and "L" stretch x 20s  Access Code: [email or web address] URL: [email or web address]  ASSESSMENT:  CLINICAL IMPRESSION:  Patient tolerating session well on land today. Began with core strengthening and spinal mobility exercises on table which are performed well after initial cueing. Moderate fatigue with standing glute strengthening. Ended session on nustep for conditioning. Patient will continue to benefit from physical therapy in order to improve function and reduce impairment.         Initial Impression Patient is a 53 y.o. f who was seen today for physical therapy evaluation and treatment for lumbar stenosis/LBP. She does have intermittent radicular pain into her lle to knee with extended walking. Reports reduction of pain after steroid injection a few month ago.  Slump test neg.  Hip flex and abduction strength is fairly good although resistance increases lbp, posterior core strength lacking.  Lumbar ROM also good but experiences pain in LB at end ranges. Her biggest complaint is ability to tolerate amb as pain begins immediately once up and gait initiated and is limited after 5 mins. She does use a cane out of home.  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 She is going to [facility] in December and wants to improve her walking as much as able.  She will benefit from skilled physical therapy intervention beginning in aquatics to initiate reduction and improved management of pain and strengthening/ stretching post core using the properties of water.  Will transition to land based as tolerated.  OBJECTIVE IMPAIRMENTS: Abnormal gait, decreased activity tolerance, decreased balance, decreased endurance, difficulty walking, decreased strength, obesity, and pain.   ACTIVITY LIMITATIONS: carrying, lifting, bending, sitting, standing, stairs, transfers, and locomotion level  PARTICIPATION LIMITATIONS: meal prep, cleaning, laundry, shopping, community activity, and yard  work  PERSONAL FACTORS: Fitness and 1-2 comorbidities: see problem  list  are also affecting patient's functional outcome.   REHAB POTENTIAL: Good  CLINICAL DECISION MAKING: Evolving/moderate complexity  EVALUATION COMPLEXITY: Moderate   GOALS: Goals reviewed with patient? Yes  SHORT TERM GOALS: Target date: 09/05/23  Pt will tolerate full aquatic sessions consistently without increase in pain and with improving function to demonstrate good toleration and effectiveness of intervention.  Baseline: Goal status: Met 09/02/23  2.  Pt will report feeling "looser" in hips and back with stretching submerged (personal goal) Baseline: tight Goal status: Met 09/02/23  3.  Pt will improve on Tug test to <or=13.5 (community dwelling adults) to demonstrate improvement in lower extremity function, mobility and decreased fall risk. Baseline: 15.42 Goal status: INITIAL  LONG TERM GOALS: Target date: 10/11/23  Pt to improve on ODI to below 20% to demonstrate statistically significant Improvement in function. Baseline: 17/45=37% Goal status: INITIAL  2.  Pt will report  a 75% reduction in LBP Baseline:  Goal status: INITIAL  3.  Pt will improve on 5 X STS test to <or= 17s (MDC) to demonstrate improving functional lower extremity strength, transitional movements, and balance Baseline: 21.88 Goal status: INITIAL  4.  Pt will perform full lumbar ROM without pain Baseline: see chart Goal status: INITIAL  5.  Pt will report amb community distances without limitation to pain (up to 2000 ft) with AD as needed Baseline:  Goal status: INITIAL    PLAN:  PT FREQUENCY: 1-2x/week  PT DURATION: 8 weeks  PLANNED INTERVENTIONS: 97164- PT Re-evaluation, 97110-Therapeutic exercises, 97530- Therapeutic activity, 97112- Neuromuscular re-education, 97535- Self Care, 82956- Manual therapy, [phone]- Gait training, [phone]- Orthotic Fit/training, [phone]- Aquatic Therapy, [phone]- Electrical stimulation  (unattended), [phone]- Ionotophoresis 4mg /ml Dexamethasone , Patient/Family education, Balance training, Stair training, Taping, Dry Needling, Joint mobilization, DME instructions, Cryotherapy, and Moist heat.  PLAN FOR NEXT SESSION: aquatics: glut and posterior core strengthening; gait. Balance stretching LE and lumbar spine   Beather Liming, PT 09/17/2023, 1:47 PM  [facility] [address] [place], [place], [address] Phone: [phone]   Fax:  [phone]

## 2023-09-19 ENCOUNTER — Ambulatory Visit (HOSPITAL_BASED_OUTPATIENT_CLINIC_OR_DEPARTMENT_OTHER): Admitting: Physical Therapy

## 2023-09-19 ENCOUNTER — Encounter (HOSPITAL_BASED_OUTPATIENT_CLINIC_OR_DEPARTMENT_OTHER): Payer: Self-pay | Admitting: Physical Therapy

## 2023-09-19 DIAGNOSIS — M5459 Other low back pain: Secondary | ICD-10-CM

## 2023-09-19 DIAGNOSIS — R262 Difficulty in walking, not elsewhere classified: Secondary | ICD-10-CM

## 2023-09-19 DIAGNOSIS — M6281 Muscle weakness (generalized): Secondary | ICD-10-CM

## 2023-09-19 NOTE — Therapy (Signed)
 OUTPATIENT PHYSICAL THERAPY THORACOLUMBAR TREATMENT   Patient Name: Audrey Goodman MRN: [id number] DOB:26-Nov-1970, 53 y.o., female Today's Date: 09/19/2023  END OF SESSION:  PT End of Session - 09/19/23 0808     Visit Number 9    Number of Visits 16    Date for PT Re-Evaluation 10/11/23    Authorization Type UHC    PT Start Time 0804    PT Stop Time 0842    PT Time Calculation (min) 38 min    Activity Tolerance Patient tolerated treatment well    Behavior During Therapy WFL for tasks assessed/performed                 Past Medical History:  Diagnosis Date   Anxiety    Arthritis    left shoulder   Back pain    Chronic kidney disease    stage 1   Depression    Diabetes mellitus without complication (HCC)    type 2- on meds   Diverticulitis    High cholesterol    History of chicken pox    Hyperlipidemia    on meds   Hypertension    on meds   Joint pain    Lactose intolerance    PCOS (polycystic ovarian syndrome)    Seasonal allergies    Shingles    Sleep apnea    does NOT use CPAP   SVD (spontaneous vaginal delivery) 1995   x 1   Type 2 diabetes mellitus, uncontrolled 07/30/2006   Qualifier: Diagnosis of  By: Redia Candela  MD, Roz Cornelia     Past Surgical History:  Procedure Laterality Date   ANKLE FRACTURE SURGERY  05/28/1989   right   CHOLECYSTECTOMY  05/28/2006   COLONOSCOPY  1989   due to blood in stool and diverticulitis per pt   HYSTEROSCOPY N/A 09/13/2014   Procedure: HYSTEROSCOPY with IUD removal;  Surgeon: Dyanna Glasgow, DO;  Location: [facility];  Service: Gynecology;  Laterality: N/A;  need longest speculum and instruments that aresavailable due to patient's habitus (BMI ~60)   Patient Active Problem List   Diagnosis Date Noted   Snoring 07/10/2023   Insomnia 02/20/2023   Attention deficit hyperactivity disorder, combined type, moderate 03/01/2022   Impacted cerumen of both ears 01/23/2021   Left Achilles tendinitis 06/19/2017   Depression  07/13/2015   Preventative health care 06/23/2014   PCOS (polycystic ovarian syndrome) 09/10/2013   CHOLELITHIASIS 02/20/2008   Hyperlipidemia 08/06/2006   Uncontrolled diabetes mellitus with hyperglycemia (HCC) 07/30/2006   HYPERTENSION, BENIGN ESSENTIAL 07/30/2006   MORBID OBESITY 07/25/2006    PCP: Dorrene Gaucher NP  REFERRING PROVIDER:   Shauna Del, DO    REFERRING DIAG:  [phone] (ICD-10-CM) - Foraminal stenosis of lumbar region  M54.42,G89.29 (ICD-10-CM) - Chronic left-sided low back pain with left-sided sciatica    Rationale for Evaluation and Treatment: Rehabilitation  THERAPY DIAG:  Other low back pain  Muscle weakness (generalized)  Difficulty in walking, not elsewhere classified  ONSET DATE: April 2024  SUBJECTIVE:  SUBJECTIVE STATEMENT: Pt reports land based session was good challenge. Some residual muscle soreness in quads and gluts.  Minimal increase in "normal" pain  POOL ACCESS: has pool in apt complex open Memorial Day to Labor Day  Initial subjective I was told I had left hip OA but minimal, shouldn't be causing as much pain as I am having.  Had shot in L3 which pulled my pain down (Oct 2024).  Had another in L4 without relief (Jan 2025).  Pain is daily, occasionally waking at night. Started when I began walking more a little over 1 year ago at [employer].  Have been up a down since  PERTINENT HISTORY:  Left shoulder OA, chronic kidney disease, type 2 DM, HLD, right ankle fracture '91, morbid obesity   PAIN:  Are you having pain? Yes: NPRS scale: 4/10 Pain location: lumbar spine  Pain description: ache intermittent Aggravating factors: walking few minutes; sitting > 1 hour Relieving factors: sitting, standing  PRECAUTIONS: None  RED FLAGS: None   WEIGHT  BEARING RESTRICTIONS: No  FALLS:  Has patient fallen in last 6 months? No  LIVING ENVIRONMENT: Lives with: lives alone Lives in: House/apartment Stairs:  Avoids stairs Has following equipment at home: Single point cane  OCCUPATION: Claims specialist, works from home on a computer   PLOF: Independent  PATIENT GOALS: get looser in hips, decrease pain in back, going back to [place] in Dec  NEXT MD VISIT: as needed  OBJECTIVE:  Note: Objective measures were completed at Evaluation unless otherwise noted.  DIAGNOSTIC FINDINGS:  None in chart  PATIENT SURVEYS:  Modified Oswestry 17/45=37%   COGNITION: Overall cognitive status: Within functional limits for tasks assessed     SENSATION: WFL  MUSCLE LENGTH: Hamstrings: WFL tested in sitting    POSTURE: rounded shoulders and increased lumbar lordosis  PALPATION: TTP slight left IT band, lumbar paraspinals  LUMBAR ROM:   AROM eval  Flexion full  Extension Full P!  Right lateral flexion Full P!  Left lateral flexion full  Right rotation   Left rotation    (Blank rows = not tested)  LOWER EXTREMITY ROM:     wfl  LOWER EXTREMITY MMT:    MMT Right eval Left eval  Hip flexion 50.6 53.9  Hip extension    Hip abduction 33.2 37.6  Hip adduction    Hip internal rotation    Hip external rotation    Knee flexion    Knee extension    Ankle dorsiflexion    Ankle plantarflexion    Ankle inversion    Ankle eversion     (Blank rows = not tested)  LUMBAR SPECIAL TESTS:  Straight leg raise test: Negative and Slump test: Negative  FUNCTIONAL TESTS:  5 times sit to stand: 21.88 Timed up and go (TUG): 15.42 4 stage balance: 1&2 passed; tandem x 10s; SLS x2s  GAIT: Distance walked: 400 ft Assistive device utilized: Single point cane Level of assistance: Complete Independence Comments: slight bilateral hip trendelenburg  TREATMENT [facility] Adult PT Treatment:                                                DATE:  09/19/23 Pt seen for aquatic therapy today.  Treatment took place in water 3.5-4.75 ft in depth at the [facility] pool. Temp of water was 91.  Pt entered/exited the pool  via stairs using step to pattern with hand rail.  *unsupported walking forward/ backward with reciprocal arm swing, multiple laps  * L stretch at wall ; L stretch with tail wagging * farmers carry with bilateral then single yellow hand floats - marching forward and back  * hip hinge with forward arm reach with yellow hand floats x 10 * side stepping -> with arm abdct/ addct -> with rainbow hand floats *resisted row using rider band 2 x 10.  Cues for tight core. * UE support on yellow HB->unsupported: Hip abdct/ addct x 10 ; traditional squat x 8; hip flex/ext x 8 *cycling on noodle for decompression and unloaded * L stretch at wall and walking in between exercises as needed for recovery     Pt requires the buoyancy and hydrostatic pressure of water for support, and to offload joints by unweighting joint load by at least 50 % in navel deep water and by at least 75-80% in chest to neck deep water.  Viscosity of the water is needed for resistance of strengthening. Water current perturbations provides challenge to standing balance requiring increased core activation.     09/17/23 LTR 10 x 5 second holds Ab set 5 x 5 second holds March with ab set 3 x 10 Bridge 2 x 10  Standing hip abduction 2 x 10  Standing hip extension 2 x 10 Lateral stepping 6 x 10 feet Seated trunk flexion stretch with green ball 2 x 10 x 5 second holds Nustep seat 7 level 3 5 minutes for conditioning   PATIENT EDUCATION:  Education details: aquatic therapy exercise progression/ modification  Person educated: Patient Education method: Explanation Education comprehension: verbalized understanding  HOME EXERCISE PROGRAM: Verbally assigned standing hip ext stretch 20s x 2, and "L" stretch x 20s  Access Code: [email or web address] URL:  [email or web address]  ASSESSMENT:  CLINICAL IMPRESSION: Good response to first land based session. Demonstrates good progression with beginning transition to next level. Continued focus on core strengthening given multiple cues for core engagement throughout. Requires a few resting periods into L stretch for LB tightness.  She will be getting membership here at [facility] soon for pool access.  Will continue to benefit from skilled PT to progress towards all land based goals.       Initial Impression Patient is a 52 y.o. f who was seen today for physical therapy evaluation and treatment for lumbar stenosis/LBP. She does have intermittent radicular pain into her lle to knee with extended walking. Reports reduction of pain after steroid injection a few month ago.  Slump test neg.  Hip flex and abduction strength is fairly good although resistance increases lbp, posterior core strength lacking.  Lumbar ROM also good but experiences pain in LB at end ranges. Her biggest complaint is ability to tolerate amb as pain begins immediately once up and gait initiated and is limited after 5 mins. She does use a cane out of home.  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 She is going to [employer] in December and wants to improve her walking as much as able.  She will benefit from skilled physical therapy intervention beginning in aquatics to initiate reduction and improved management of pain and strengthening/ stretching post core using the properties of water.  Will transition to land based as tolerated.  OBJECTIVE IMPAIRMENTS: Abnormal gait, decreased activity tolerance, decreased balance, decreased endurance, difficulty walking, decreased strength, obesity, and pain.   ACTIVITY LIMITATIONS: carrying, lifting, bending, sitting, standing, stairs, transfers, and locomotion level  PARTICIPATION LIMITATIONS: meal prep, cleaning, laundry, shopping, community activity, and yard work  PERSONAL FACTORS: Fitness  and 1-2 comorbidities: see problem list  are also affecting patient's functional outcome.   REHAB POTENTIAL: Good  CLINICAL DECISION MAKING: Evolving/moderate complexity  EVALUATION COMPLEXITY: Moderate   GOALS: Goals reviewed with patient? Yes  SHORT TERM GOALS: Target date: 09/05/23  Pt will tolerate full aquatic sessions consistently without increase in pain and with improving function to demonstrate good toleration and effectiveness of intervention.  Baseline: Goal status: Met 09/02/23  2.  Pt will report feeling "looser" in hips and back with stretching submerged (personal goal) Baseline: tight Goal status: Met 09/02/23  3.  Pt will improve on Tug test to <or=13.5 (community dwelling adults) to demonstrate improvement in lower extremity function, mobility and decreased fall risk. Baseline: 15.42 Goal status: INITIAL  LONG TERM GOALS: Target date: 10/11/23  Pt to improve on ODI to below 20% to demonstrate statistically significant Improvement in function. Baseline: 17/45=37% Goal status: INITIAL  2.  Pt will report  a 75% reduction in LBP Baseline:  Goal status: INITIAL  3.  Pt will improve on 5 X STS test to <or= 17s (MDC) to demonstrate improving functional lower extremity strength, transitional movements, and balance Baseline: 21.88 Goal status: INITIAL  4.  Pt will perform full lumbar ROM without pain Baseline: see chart Goal status: INITIAL  5.  Pt will report amb community distances without limitation to pain (up to 2000 ft) with AD as needed Baseline:  Goal status: INITIAL    PLAN:  PT FREQUENCY: 1-2x/week  PT DURATION: 8 weeks  PLANNED INTERVENTIONS: 97164- PT Re-evaluation, 97110-Therapeutic exercises, 97530- Therapeutic activity, 97112- Neuromuscular re-education, 97535- Self Care, 16109- Manual therapy, [phone]- Gait training, [phone]- Orthotic Fit/training, [phone]- Aquatic Therapy, [phone]- Electrical stimulation (unattended), [phone]- Ionotophoresis 4mg /ml  Dexamethasone , Patient/Family education, Balance training, Stair training, Taping, Dry Needling, Joint mobilization, DME instructions, Cryotherapy, and Moist heat.  PLAN FOR NEXT SESSION: aquatics: glut and posterior core strengthening; gait. Balance stretching LE and lumbar spine   Aretta Kudo, PT 09/19/2023, 10:59 AM  [facility] [address] [place], [place], [address] Phone: [phone]   Fax:  [phone]

## 2023-09-23 ENCOUNTER — Ambulatory Visit (HOSPITAL_BASED_OUTPATIENT_CLINIC_OR_DEPARTMENT_OTHER): Admitting: Physical Therapy

## 2023-09-24 ENCOUNTER — Ambulatory Visit (HOSPITAL_BASED_OUTPATIENT_CLINIC_OR_DEPARTMENT_OTHER): Admitting: Physical Therapy

## 2023-09-24 ENCOUNTER — Encounter (HOSPITAL_BASED_OUTPATIENT_CLINIC_OR_DEPARTMENT_OTHER): Payer: Self-pay | Admitting: Physical Therapy

## 2023-09-24 DIAGNOSIS — M5459 Other low back pain: Secondary | ICD-10-CM | POA: Diagnosis not present

## 2023-09-24 DIAGNOSIS — R262 Difficulty in walking, not elsewhere classified: Secondary | ICD-10-CM

## 2023-09-24 DIAGNOSIS — M6281 Muscle weakness (generalized): Secondary | ICD-10-CM

## 2023-09-24 NOTE — Therapy (Signed)
 OUTPATIENT PHYSICAL THERAPY THORACOLUMBAR TREATMENT   Patient Name: ROSANGELA GARRETTE MRN: [id number] DOB:1970-11-06, 53 y.o., female Today's Date: 09/24/2023  END OF SESSION:  PT End of Session - 09/24/23 1301     Visit Number 10    Number of Visits 16    Date for PT Re-Evaluation 10/11/23    Authorization Type [employer]    PT Start Time 1302    PT Stop Time 1340    PT Time Calculation (min) 38 min    Activity Tolerance Patient tolerated treatment well    Behavior During Therapy WFL for tasks assessed/performed                 Past Medical History:  Diagnosis Date   Anxiety    Arthritis    left shoulder   Back pain    Chronic kidney disease    stage 1   Depression    Diabetes mellitus without complication (HCC)    type 2- on meds   Diverticulitis    High cholesterol    History of chicken pox    Hyperlipidemia    on meds   Hypertension    on meds   Joint pain    Lactose intolerance    PCOS (polycystic ovarian syndrome)    Seasonal allergies    Shingles    Sleep apnea    does NOT use CPAP   SVD (spontaneous vaginal delivery) 1995   x 1   Type 2 diabetes mellitus, uncontrolled 07/30/2006   Qualifier: Diagnosis of  By: Redia Candela  MD, Roz Cornelia     Past Surgical History:  Procedure Laterality Date   ANKLE FRACTURE SURGERY  05/28/1989   right   CHOLECYSTECTOMY  05/28/2006   COLONOSCOPY  1989   due to blood in stool and diverticulitis per pt   HYSTEROSCOPY N/A 09/13/2014   Procedure: HYSTEROSCOPY with IUD removal;  Surgeon: Dyanna Glasgow, DO;  Location: [facility];  Service: Gynecology;  Laterality: N/A;  need longest speculum and instruments that aresavailable due to patient's habitus (BMI ~60)   Patient Active Problem List   Diagnosis Date Noted   Snoring 07/10/2023   Insomnia 02/20/2023   Attention deficit hyperactivity disorder, combined type, moderate 03/01/2022   Impacted cerumen of both ears 01/23/2021   Left Achilles tendinitis 06/19/2017   Depression  07/13/2015   Preventative health care 06/23/2014   PCOS (polycystic ovarian syndrome) 09/10/2013   CHOLELITHIASIS 02/20/2008   Hyperlipidemia 08/06/2006   Uncontrolled diabetes mellitus with hyperglycemia (HCC) 07/30/2006   HYPERTENSION, BENIGN ESSENTIAL 07/30/2006   MORBID OBESITY 07/25/2006    PCP: Dorrene Gaucher NP  REFERRING PROVIDER:   Shauna Del, DO    REFERRING DIAG:  [phone] (ICD-10-CM) - Foraminal stenosis of lumbar region  M54.42,G89.29 (ICD-10-CM) - Chronic left-sided low back pain with left-sided sciatica    Rationale for Evaluation and Treatment: Rehabilitation  THERAPY DIAG:  Other low back pain  Muscle weakness (generalized)  Difficulty in walking, not elsewhere classified  ONSET DATE: April 2024  SUBJECTIVE:  SUBJECTIVE STATEMENT: Pt reports good response to land and pool PT.   POOL ACCESS: has pool in apt complex open Memorial Day to Labor Day  Initial subjective I was told I had left hip OA but minimal, shouldn't be causing as much pain as I am having.  Had shot in L3 which pulled my pain down (Oct 2024).  Had another in L4 without relief (Jan 2025).  Pain is daily, occasionally waking at night. Started when I began walking more a little over 1 year ago at [employer].  Have been up a down since  PERTINENT HISTORY:  Left shoulder OA, chronic kidney disease, type 2 DM, HLD, right ankle fracture '91, morbid obesity   PAIN:  Are you having pain? Yes: NPRS scale: 4/10 Pain location: lumbar spine  Pain description: ache intermittent Aggravating factors: walking few minutes; sitting > 1 hour Relieving factors: sitting, standing  PRECAUTIONS: None  RED FLAGS: None   WEIGHT BEARING RESTRICTIONS: No  FALLS:  Has patient fallen in last 6 months? No  LIVING  ENVIRONMENT: Lives with: lives alone Lives in: House/apartment Stairs:  Avoids stairs Has following equipment at home: Single point cane  OCCUPATION: Claims specialist, works from home on a computer   PLOF: Independent  PATIENT GOALS: get looser in hips, decrease pain in back, going back to [place] in Dec  NEXT MD VISIT: as needed  OBJECTIVE:  Note: Objective measures were completed at Evaluation unless otherwise noted.  DIAGNOSTIC FINDINGS:  None in chart  PATIENT SURVEYS:  Modified Oswestry 17/45=37%   COGNITION: Overall cognitive status: Within functional limits for tasks assessed     SENSATION: WFL  MUSCLE LENGTH: Hamstrings: WFL tested in sitting    POSTURE: rounded shoulders and increased lumbar lordosis  PALPATION: TTP slight left IT band, lumbar paraspinals  LUMBAR ROM:   AROM eval  Flexion full  Extension Full P!  Right lateral flexion Full P!  Left lateral flexion full  Right rotation   Left rotation    (Blank rows = not tested)  LOWER EXTREMITY ROM:     wfl  LOWER EXTREMITY MMT:    MMT Right eval Left eval  Hip flexion 50.6 53.9  Hip extension    Hip abduction 33.2 37.6  /Hip adduction    Hip internal rotation    Hip external rotation    Knee flexion    Knee extension    Ankle dorsiflexion    Ankle plantarflexion    Ankle inversion    Ankle eversion     (Blank rows = not tested)  LUMBAR SPECIAL TESTS:  Straight leg raise test: Negative and Slump test: Negative  FUNCTIONAL TESTS:  5 times sit to stand: 21.88 Timed up and go (TUG): 15.42 4 stage balance: 1&2 passed; tandem x 10s; SLS x2s  GAIT: Distance walked: 400 ft Assistive device utilized: Single point cane Level of assistance: Complete Independence Comments: slight bilateral hip trendelenburg  TREATMENT 09/04/23 LTR 10 x 5 second holds Prone hip extension 2 x 10 SLR with ab set 1 x 10 Standing row RTB 2 x 15 Standing shoulder extension RTB 2 x 12 Standing palof  press 1 x 10  STS 1 x 10  [facility] Adult PT Treatment:  DATE: 09/19/23 Pt seen for aquatic therapy today.  Treatment took place in water 3.5-4.75 ft in depth at the [facility] pool. Temp of water was 91.  Pt entered/exited the pool via stairs using step to pattern with hand rail.  *unsupported walking forward/ backward with reciprocal arm swing, multiple laps  * L stretch at wall ; L stretch with tail wagging * farmers carry with bilateral then single yellow hand floats - marching forward and back  * hip hinge with forward arm reach with yellow hand floats x 10 * side stepping -> with arm abdct/ addct -> with rainbow hand floats *resisted row using rider band 2 x 10.  Cues for tight core. * UE support on yellow HB->unsupported: Hip abdct/ addct x 10 ; traditional squat x 8; hip flex/ext x 8 *cycling on noodle for decompression and unloaded * L stretch at wall and walking in between exercises as needed for recovery     Pt requires the buoyancy and hydrostatic pressure of water for support, and to offload joints by unweighting joint load by at least 50 % in navel deep water and by at least 75-80% in chest to neck deep water.  Viscosity of the water is needed for resistance of strengthening. Water current perturbations provides challenge to standing balance requiring increased core activation.     09/17/23 LTR 10 x 5 second holds Ab set 5 x 5 second holds March with ab set 3 x 10 Bridge 2 x 10  Standing hip abduction 2 x 10  Standing hip extension 2 x 10 Lateral stepping 6 x 10 feet Seated trunk flexion stretch with green ball 2 x 10 x 5 second holds Nustep seat 7 level 3 5 minutes for conditioning   PATIENT EDUCATION:  Education details: HEP Person educated: Patient Education method: Explanation Education comprehension: verbalized understanding  HOME EXERCISE PROGRAM: Verbally assigned standing hip ext stretch 20s x 2, and "L"  stretch x 20s  Access Code: [email or web address] URL: [email or web address]  ASSESSMENT:  CLINICAL IMPRESSION: Continued with core and hip strength which is tolerated well. Resisted core/UE strength with good mechanics after initial cueing. Updated HEP.  Patient will continue to benefit from physical therapy in order to improve function and reduce impairment.        Initial Impression Patient is a 53 y.o. f who was seen today for physical therapy evaluation and treatment for lumbar stenosis/LBP. She does have intermittent radicular pain into her lle to knee with extended walking. Reports reduction of pain after steroid injection a few month ago.  Slump test neg.  Hip flex and abduction strength is fairly good although resistance increases lbp, posterior core strength lacking.  Lumbar ROM also good but experiences pain in LB at end ranges. Her biggest complaint is ability to tolerate amb as pain begins immediately once up and gait initiated and is limited after 5 mins. She does use a cane out of home.  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 She is going to [facility] in December and wants to improve her walking as much as able.  She will benefit from skilled physical therapy intervention beginning in aquatics to initiate reduction and improved management of pain and strengthening/ stretching post core using the properties of water.  Will transition to land based as tolerated.  OBJECTIVE IMPAIRMENTS: Abnormal gait, decreased activity tolerance, decreased balance, decreased endurance, difficulty walking, decreased strength, obesity, and pain.   ACTIVITY LIMITATIONS: carrying, lifting, bending, sitting, standing, stairs, transfers, and locomotion level  PARTICIPATION LIMITATIONS: meal prep, cleaning, laundry, shopping, community activity, and yard work  PERSONAL FACTORS: Fitness and 1-2 comorbidities: see problem list  are also affecting patient's functional outcome.   REHAB POTENTIAL:  Good  CLINICAL DECISION MAKING: Evolving/moderate complexity  EVALUATION COMPLEXITY: Moderate   GOALS: Goals reviewed with patient? Yes  SHORT TERM GOALS: Target date: 09/05/23  Pt will tolerate full aquatic sessions consistently without increase in pain and with improving function to demonstrate good toleration and effectiveness of intervention.  Baseline: Goal status: Met 09/02/23  2.  Pt will report feeling "looser" in hips and back with stretching submerged (personal goal) Baseline: tight Goal status: Met 09/02/23  3.  Pt will improve on Tug test to <or=13.5 (community dwelling adults) to demonstrate improvement in lower extremity function, mobility and decreased fall risk. Baseline: 15.42 Goal status: INITIAL  LONG TERM GOALS: Target date: 10/11/23  Pt to improve on ODI to below 20% to demonstrate statistically significant Improvement in function. Baseline: 17/45=37% Goal status: INITIAL  2.  Pt will report  a 75% reduction in LBP Baseline:  Goal status: INITIAL  3.  Pt will improve on 5 X STS test to <or= 17s ([facility]) to demonstrate improving functional lower extremity strength, transitional movements, and balance Baseline: 21.88 Goal status: INITIAL  4.  Pt will perform full lumbar ROM without pain Baseline: see chart Goal status: INITIAL  5.  Pt will report amb community distances without limitation to pain (up to 2000 ft) with AD as needed Baseline:  Goal status: INITIAL    PLAN:  PT FREQUENCY: 1-2x/week  PT DURATION: 8 weeks  PLANNED INTERVENTIONS: 97164- PT Re-evaluation, 97110-Therapeutic exercises, 97530- Therapeutic activity, 97112- Neuromuscular re-education, 97535- Self Care, 16109- Manual therapy, [phone]- Gait training, [phone]- Orthotic Fit/training, [phone]- Aquatic Therapy, [phone]- Electrical stimulation (unattended), [phone]- Ionotophoresis 4mg /ml Dexamethasone , Patient/Family education, Balance training, Stair training, Taping, Dry Needling, Joint  mobilization, DME instructions, Cryotherapy, and Moist heat.  PLAN FOR NEXT SESSION: aquatics: glut and posterior core strengthening; gait. Balance stretching LE and lumbar spine   Beather Liming, PT 09/24/2023, 1:03 PM  [facility] [address] [place], [place], [address] Phone: [phone]   Fax:  [phone]

## 2023-09-26 ENCOUNTER — Ambulatory Visit (HOSPITAL_BASED_OUTPATIENT_CLINIC_OR_DEPARTMENT_OTHER): Attending: Sports Medicine | Admitting: Physical Therapy

## 2023-09-26 ENCOUNTER — Encounter (HOSPITAL_BASED_OUTPATIENT_CLINIC_OR_DEPARTMENT_OTHER): Payer: Self-pay | Admitting: Physical Therapy

## 2023-09-26 ENCOUNTER — Other Ambulatory Visit: Payer: Self-pay | Admitting: Sports Medicine

## 2023-09-26 DIAGNOSIS — M6281 Muscle weakness (generalized): Secondary | ICD-10-CM | POA: Insufficient documentation

## 2023-09-26 DIAGNOSIS — R262 Difficulty in walking, not elsewhere classified: Secondary | ICD-10-CM | POA: Insufficient documentation

## 2023-09-26 DIAGNOSIS — M5459 Other low back pain: Secondary | ICD-10-CM | POA: Insufficient documentation

## 2023-09-26 NOTE — Therapy (Addendum)
 OUTPATIENT PHYSICAL THERAPY THORACOLUMBAR TREATMENT   Patient Name: Audrey Goodman MRN: [id number] DOB:April 25, 1971, 53 y.o., female Today's Date: 09/26/2023  END OF SESSION:  PT End of Session - 09/26/23 0815     Visit Number 11    Number of Visits 16    Date for PT Re-Evaluation 10/11/23    Authorization Type [employer]    PT Start Time 0800    PT Stop Time 0840    PT Time Calculation (min) 40 min    Activity Tolerance Patient tolerated treatment well    Behavior During Therapy WFL for tasks assessed/performed             Past Medical History:  Diagnosis Date   Anxiety    Arthritis    left shoulder   Back pain    Chronic kidney disease    stage 1   Depression    Diabetes mellitus without complication (HCC)    type 2- on meds   Diverticulitis    High cholesterol    History of chicken pox    Hyperlipidemia    on meds   Hypertension    on meds   Joint pain    Lactose intolerance    PCOS (polycystic ovarian syndrome)    Seasonal allergies    Shingles    Sleep apnea    does NOT use CPAP   SVD (spontaneous vaginal delivery) 1995   x 1   Type 2 diabetes mellitus, uncontrolled 07/30/2006   Qualifier: Diagnosis of  By: Redia Candela  MD, Roz Cornelia     Past Surgical History:  Procedure Laterality Date   ANKLE FRACTURE SURGERY  05/28/1989   right   CHOLECYSTECTOMY  05/28/2006   COLONOSCOPY  1989   due to blood in stool and diverticulitis per pt   HYSTEROSCOPY N/A 09/13/2014   Procedure: HYSTEROSCOPY with IUD removal;  Surgeon: Dyanna Glasgow, DO;  Location: [facility];  Service: Gynecology;  Laterality: N/A;  need longest speculum and instruments that aresavailable due to patient's habitus (BMI ~60)   Patient Active Problem List   Diagnosis Date Noted   Snoring 07/10/2023   Insomnia 02/20/2023   Attention deficit hyperactivity disorder, combined type, moderate 03/01/2022   Impacted cerumen of both ears 01/23/2021   Left Achilles tendinitis 06/19/2017   Depression 07/13/2015    Preventative health care 06/23/2014   PCOS (polycystic ovarian syndrome) 09/10/2013   CHOLELITHIASIS 02/20/2008   Hyperlipidemia 08/06/2006   Uncontrolled diabetes mellitus with hyperglycemia (HCC) 07/30/2006   HYPERTENSION, BENIGN ESSENTIAL 07/30/2006   MORBID OBESITY 07/25/2006    PCP: Dorrene Gaucher NP  REFERRING PROVIDER:   Shauna Del, DO    REFERRING DIAG:  [phone] (ICD-10-CM) - Foraminal stenosis of lumbar region  M54.42,G89.29 (ICD-10-CM) - Chronic left-sided low back pain with left-sided sciatica    Rationale for Evaluation and Treatment: Rehabilitation  THERAPY DIAG:  Other low back pain  Muscle weakness (generalized)  Difficulty in walking, not elsewhere classified  ONSET DATE: April 2024  SUBJECTIVE:  SUBJECTIVE STATEMENT: "I'm learning to manage my pain better. "    Pt reports 50% improvement in symptoms since starting therapy.    POOL ACCESS: has pool in apt complex open Memorial Day to Labor Day. Pt reports she plans to attain membership at d/c.   Initial subjective I was told I had left hip OA but minimal, shouldn't be causing as much pain as I am having.  Had shot in L3 which pulled my pain down (Oct 2024).  Had another in L4 without relief (Jan 2025).  Pain is daily, occasionally waking at night. Started when I began walking more a little over 1 year ago at [employer].  Have been up a down since  PERTINENT HISTORY:  Left shoulder OA, chronic kidney disease, type 2 DM, HLD, right ankle fracture '91, morbid obesity   PAIN:  Are you having pain? no: NPRS scale: 0 Pain location: lumbar spine  Pain description: ache intermittent Aggravating factors: walking few minutes; sitting > 1 hour Relieving factors: sitting, standing  PRECAUTIONS: None  RED  FLAGS: None   WEIGHT BEARING RESTRICTIONS: No  FALLS:  Has patient fallen in last 6 months? No  LIVING ENVIRONMENT: Lives with: lives alone Lives in: House/apartment Stairs:  Avoids stairs Has following equipment at home: Single point cane  OCCUPATION: Claims specialist, works from home on a computer   PLOF: Independent  PATIENT GOALS: get looser in hips, decrease pain in back, going back to [place] [employer] in Dec  NEXT MD VISIT: as needed  OBJECTIVE:  Note: Objective measures were completed at Evaluation unless otherwise noted.  DIAGNOSTIC FINDINGS:  None in chart  PATIENT SURVEYS:  Modified Oswestry 17/45=37%   COGNITION: Overall cognitive status: Within functional limits for tasks assessed     SENSATION: WFL  MUSCLE LENGTH: Hamstrings: WFL tested in sitting    POSTURE: rounded shoulders and increased lumbar lordosis  PALPATION: TTP slight left IT band, lumbar paraspinals  LUMBAR ROM:   AROM eval  Flexion full  Extension Full P!  Right lateral flexion Full P!  Left lateral flexion full  Right rotation   Left rotation    (Blank rows = not tested)  LOWER EXTREMITY ROM:     wfl  LOWER EXTREMITY MMT:    MMT Right eval Left eval  Hip flexion 50.6 53.9  Hip extension    Hip abduction 33.2 37.6  /Hip adduction    Hip internal rotation    Hip external rotation    Knee flexion    Knee extension    Ankle dorsiflexion    Ankle plantarflexion    Ankle inversion    Ankle eversion     (Blank rows = not tested)  LUMBAR SPECIAL TESTS:  Straight leg raise test: Negative and Slump test: Negative  FUNCTIONAL TESTS:  5 times sit to stand: 21.88 Timed up and go (TUG): 15.42 4 stage balance: 1&2 passed; tandem x 10s; SLS x2s  GAIT: Distance walked: 400 ft Assistive device utilized: Single point cane Level of assistance: Complete Independence Comments: slight bilateral hip trendelenburg  TREATMENT [facility] Adult PT Treatment:                                                 DATE: 09/26/23 Pt seen for aquatic therapy today.  Treatment took place in water 3.5-4.75 ft in depth at the [facility] pool.  Temp of water was 91.  Pt entered/exited the pool via stairs using step to pattern with hand rail.  *unsupported walking forward/ backward with reciprocal arm swing, multiple laps  * * side stepping -> with arm abdct/ addct -> with rainbow hand floats * L stretch at wall  * hip hinge with forward arm reach with yellow hand floats x 10 * TrA set with hollow noodle pull down to thighs x 10, repeated in staggered stance x 5 each * UE support on wall: Hip abdct/ addct x 10 -> with UE on yellow hand floats x 10 * marching with row motion (bilat/ alternating) with yellow hand floats on top of water *cycling (noodle wrapped anteriorly) for decompression - brief rest at corners for LE to dangle   09/04/23 LTR 10 x 5 second holds Prone hip extension 2 x 10 SLR with ab set 1 x 10 Standing row RTB 2 x 15 Standing shoulder extension RTB 2 x 12 Standing palof press 1 x 10  STS 1 x 10  [facility] Adult PT Treatment:                                                DATE: 09/19/23 Pt seen for aquatic therapy today.  Treatment took place in water 3.5-4.75 ft in depth at the [facility] pool. Temp of water was 91.  Pt entered/exited the pool via stairs using step to pattern with hand rail.  *unsupported walking forward/ backward with reciprocal arm swing, multiple laps  * L stretch at wall ; L stretch with tail wagging * farmers carry with bilateral then single yellow hand floats - marching forward and back  * hip hinge with forward arm reach with yellow hand floats x 10 * side stepping -> with arm abdct/ addct -> with rainbow hand floats *resisted row using rider band 2 x 10.  Cues for tight core. * UE support on yellow HB->unsupported: Hip abdct/ addct x 10 ; traditional squat x 8; hip flex/ext x 8 *cycling on noodle for decompression and unloaded * L  stretch at wall and walking in between exercises as needed for recovery     Pt requires the buoyancy and hydrostatic pressure of water for support, and to offload joints by unweighting joint load by at least 50 % in navel deep water and by at least 75-80% in chest to neck deep water.  Viscosity of the water is needed for resistance of strengthening. Water current perturbations provides challenge to standing balance requiring increased core activation.     09/17/23 LTR 10 x 5 second holds Ab set 5 x 5 second holds March with ab set 3 x 10 Bridge 2 x 10  Standing hip abduction 2 x 10  Standing hip extension 2 x 10 Lateral stepping 6 x 10 feet Seated trunk flexion stretch with green ball 2 x 10 x 5 second holds Nustep seat 7 level 3 5 minutes for conditioning   PATIENT EDUCATION:  Education details: Person educated: Patient Education method: Explanation Education comprehension: verbalized understanding  HOME EXERCISE PROGRAM: Verbally assigned standing hip ext stretch 20s x 2, and "L" stretch x 20s  Access Code: [email or web address] URL: [email or web address] Access Code: [email or web address] URL: [email or web address] Prepared by: [facility] [address] This aquatic home exercise program from [employer] utilizes pictures from land based exercises,  but has been adapted prior to lamination and issuance.  * not issued yet  ASSESSMENT:  CLINICAL IMPRESSION: Pt arrived pain free.  She had some discomfort in L low back when engaging TrA too strongly; discomfort dissipated with dialing contraction back to 25% and performance of "L" stretch. Pain up to 1/10 by end of session.  Encouraged pt to continue working on L hip stretches as given previously. Patient will continue to benefit from physical therapy in order to improve function and reduce impairment.    Pt requests to work on stairs with cane during next land appt.  Therapist to ck TUG next  session, as time allows (STG 3)      Initial Impression Patient is a 53 y.o. f who was seen today for physical therapy evaluation and treatment for lumbar stenosis/LBP. She does have intermittent radicular pain into her lle to knee with extended walking. Reports reduction of pain after steroid injection a few month ago.  Slump test neg.  Hip flex and abduction strength is fairly good although resistance increases lbp, posterior core strength lacking.  Lumbar ROM also good but experiences pain in LB at end ranges. Her biggest complaint is ability to tolerate amb as pain begins immediately once up and gait initiated and is limited after 5 mins. She does use a cane out of home.  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 She is going to [facility] in December and wants to improve her walking as much as able.  She will benefit from skilled physical therapy intervention beginning in aquatics to initiate reduction and improved management of pain and strengthening/ stretching post core using the properties of water.  Will transition to land based as tolerated.  OBJECTIVE IMPAIRMENTS: Abnormal gait, decreased activity tolerance, decreased balance, decreased endurance, difficulty walking, decreased strength, obesity, and pain.   ACTIVITY LIMITATIONS: carrying, lifting, bending, sitting, standing, stairs, transfers, and locomotion level  PARTICIPATION LIMITATIONS: meal prep, cleaning, laundry, shopping, community activity, and yard work  PERSONAL FACTORS: Fitness and 1-2 comorbidities: see problem list  are also affecting patient's functional outcome.   REHAB POTENTIAL: Good  CLINICAL DECISION MAKING: Evolving/moderate complexity  EVALUATION COMPLEXITY: Moderate   GOALS: Goals reviewed with patient? Yes  SHORT TERM GOALS: Target date: 09/05/23  Pt will tolerate full aquatic sessions consistently without increase in pain and with improving function to demonstrate good toleration and effectiveness of  intervention.  Baseline: Goal status: Met 09/02/23  2.  Pt will report feeling "looser" in hips and back with stretching submerged (personal goal) Baseline: tight Goal status: Met 09/02/23  3.  Pt will improve on Tug test to <or=13.5 (community dwelling adults) to demonstrate improvement in lower extremity function, mobility and decreased fall risk. Baseline: 15.42 Goal status: INITIAL  LONG TERM GOALS: Target date: 10/11/23  Pt to improve on ODI to below 20% to demonstrate statistically significant Improvement in function. Baseline: 17/45=37% Goal status: INITIAL  2.  Pt will report  a 75% reduction in LBP Baseline:  Goal status: INITIAL  3.  Pt will improve on 5 X STS test to <or= 17s ([facility]) to demonstrate improving functional lower extremity strength, transitional movements, and balance Baseline: 21.88 Goal status: INITIAL  4.  Pt will perform full lumbar ROM without pain Baseline: see chart Goal status: INITIAL  5.  Pt will report amb community distances without limitation to pain (up to 2000 ft) with AD as needed Baseline:  Goal status: INITIAL    PLAN:  PT FREQUENCY: 1-2x/week  PT DURATION: 8 weeks  PLANNED INTERVENTIONS: 97164- PT Re-evaluation, 97110-Therapeutic exercises, 97530- Therapeutic activity, 97112- Neuromuscular re-education, [phone]- Self Care, 19147- Manual therapy, [phone]- Gait training, [phone]- Orthotic Fit/training, [phone]- Aquatic Therapy, [phone]- Electrical stimulation (unattended), [phone]- Ionotophoresis 4mg /ml Dexamethasone , Patient/Family education, Balance training, Stair training, Taping, Dry Needling, Joint mobilization, DME instructions, Cryotherapy, and Moist heat.  PLAN FOR NEXT SESSION: aquatics: glut and posterior core strengthening; gait. Balance stretching LE and lumbar spine   Almedia Jacobsen, PTA 09/26/23 8:51 AM [facility] [address] [place], [place], [address] Phone:  [phone]   Fax:  [phone]

## 2023-09-30 ENCOUNTER — Ambulatory Visit: Admitting: Sports Medicine

## 2023-10-01 ENCOUNTER — Encounter (HOSPITAL_BASED_OUTPATIENT_CLINIC_OR_DEPARTMENT_OTHER): Payer: Self-pay

## 2023-10-01 ENCOUNTER — Ambulatory Visit (HOSPITAL_BASED_OUTPATIENT_CLINIC_OR_DEPARTMENT_OTHER): Admitting: Physical Therapy

## 2023-10-03 ENCOUNTER — Encounter (HOSPITAL_BASED_OUTPATIENT_CLINIC_OR_DEPARTMENT_OTHER): Payer: Self-pay | Admitting: Physical Therapy

## 2023-10-03 ENCOUNTER — Ambulatory Visit (HOSPITAL_BASED_OUTPATIENT_CLINIC_OR_DEPARTMENT_OTHER): Admitting: Physical Therapy

## 2023-10-03 DIAGNOSIS — M5459 Other low back pain: Secondary | ICD-10-CM

## 2023-10-03 DIAGNOSIS — R262 Difficulty in walking, not elsewhere classified: Secondary | ICD-10-CM

## 2023-10-03 DIAGNOSIS — M6281 Muscle weakness (generalized): Secondary | ICD-10-CM

## 2023-10-03 NOTE — Therapy (Signed)
 OUTPATIENT PHYSICAL THERAPY THORACOLUMBAR TREATMENT   Patient Name: Audrey Goodman MRN: [id number] DOB:[birth year], 53 y.o., female Today's Date: 10/03/2023  END OF SESSION:  PT End of Session - 10/03/23 0805     Visit Number 12    Number of Visits 16    Date for PT Re-Evaluation 10/11/23    Authorization Type [employer]    PT Start Time 0800    PT Stop Time 0840    PT Time Calculation (min) 40 min    Activity Tolerance Patient tolerated treatment well    Behavior During Therapy WFL for tasks assessed/performed             Past Medical History:  Diagnosis Date   Anxiety    Arthritis    left shoulder   Back pain    Chronic kidney disease    stage 1   Depression    Diabetes mellitus without complication (HCC)    type 2- on meds   Diverticulitis    High cholesterol    History of chicken pox    Hyperlipidemia    on meds   Hypertension    on meds   Joint pain    Lactose intolerance    PCOS (polycystic ovarian syndrome)    Seasonal allergies    Shingles    Sleep apnea    does NOT use CPAP   SVD (spontaneous vaginal delivery) 1995   x 1   Type 2 diabetes mellitus, uncontrolled 07/30/2006   Qualifier: Diagnosis of  By: Redia Candela  MD, Roz Cornelia     Past Surgical History:  Procedure Laterality Date   ANKLE FRACTURE SURGERY  05/28/1989   right   CHOLECYSTECTOMY  05/28/2006   COLONOSCOPY  1989   due to blood in stool and diverticulitis per pt   HYSTEROSCOPY N/A 09/13/2014   Procedure: HYSTEROSCOPY with IUD removal;  Surgeon: Dyanna Glasgow, DO;  Location: [facility];  Service: Gynecology;  Laterality: N/A;  need longest speculum and instruments that aresavailable due to patient's habitus (BMI ~60)   Patient Active Problem List   Diagnosis Date Noted   Snoring 07/10/2023   Insomnia 02/20/2023   Attention deficit hyperactivity disorder, combined type, moderate 03/01/2022   Impacted cerumen of both ears 01/23/2021   Left Achilles tendinitis 06/19/2017   Depression 07/13/2015    Preventative health care 06/23/2014   PCOS (polycystic ovarian syndrome) 09/10/2013   CHOLELITHIASIS 02/20/2008   Hyperlipidemia 08/06/2006   Uncontrolled diabetes mellitus with hyperglycemia (HCC) 07/30/2006   HYPERTENSION, BENIGN ESSENTIAL 07/30/2006   MORBID OBESITY 07/25/2006    PCP: Dorrene Gaucher NP  REFERRING PROVIDER:   Shauna Del, DO    REFERRING DIAG:  [phone] (ICD-10-CM) - Foraminal stenosis of lumbar region  M54.42,G89.29 (ICD-10-CM) - Chronic left-sided low back pain with left-sided sciatica    Rationale for Evaluation and Treatment: Rehabilitation  THERAPY DIAG:  Other low back pain  Muscle weakness (generalized)  Difficulty in walking, not elsewhere classified  ONSET DATE: April 2024  SUBJECTIVE:  SUBJECTIVE STATEMENT: "I'm learning to manage my pain better. "    Pt reports 50% improvement in symptoms since starting therapy.    POOL ACCESS: has pool in apt complex open Memorial Day to Labor Day. Pt reports she plans to attain membership at d/c.   Initial subjective I was told I had left hip OA but minimal, shouldn't be causing as much pain as I am having.  Had shot in L3 which pulled my pain down (Oct 2024).  Had another in L4 without relief (Jan 2025).  Pain is daily, occasionally waking at night. Started when I began walking more a little over 1 year ago at [employer].  Have been up a down since  PERTINENT HISTORY:  Left shoulder OA, chronic kidney disease, type 2 DM, HLD, right ankle fracture '91, morbid obesity   PAIN:  Are you having pain? no: NPRS scale: 0 Pain location: lumbar spine  Pain description: ache intermittent Aggravating factors: walking few minutes; sitting > 1 hour Relieving factors: sitting, standing  PRECAUTIONS: None  RED  FLAGS: None   WEIGHT BEARING RESTRICTIONS: No  FALLS:  Has patient fallen in last 6 months? No  LIVING ENVIRONMENT: Lives with: lives alone Lives in: House/apartment Stairs:  Avoids stairs Has following equipment at home: Single point cane  OCCUPATION: Claims specialist, works from home on a computer   PLOF: Independent  PATIENT GOALS: get looser in hips, decrease pain in back, going back to [place] in Dec  NEXT MD VISIT: as needed  OBJECTIVE:  Note: Objective measures were completed at Evaluation unless otherwise noted.  DIAGNOSTIC FINDINGS:  None in chart  PATIENT SURVEYS:  Modified Oswestry 17/45=37%   COGNITION: Overall cognitive status: Within functional limits for tasks assessed     SENSATION: WFL  MUSCLE LENGTH: Hamstrings: WFL tested in sitting    POSTURE: rounded shoulders and increased lumbar lordosis  PALPATION: TTP slight left IT band, lumbar paraspinals  LUMBAR ROM:   AROM eval  Flexion full  Extension Full P!  Right lateral flexion Full P!  Left lateral flexion full  Right rotation   Left rotation    (Blank rows = not tested)  LOWER EXTREMITY ROM:     wfl  LOWER EXTREMITY MMT:    MMT Right eval Left eval  Hip flexion 50.6 53.9  Hip extension    Hip abduction 33.2 37.6  /Hip adduction    Hip internal rotation    Hip external rotation    Knee flexion    Knee extension    Ankle dorsiflexion    Ankle plantarflexion    Ankle inversion    Ankle eversion     (Blank rows = not tested)  LUMBAR SPECIAL TESTS:  Straight leg raise test: Negative and Slump test: Negative  FUNCTIONAL TESTS:  5 times sit to stand: 21.88 Timed up and go (TUG): 15.42 4 stage balance: 1&2 passed; tandem x 10s; SLS x2s  GAIT: Distance walked: 400 ft Assistive device utilized: Single point cane Level of assistance: Complete Independence Comments: slight bilateral hip trendelenburg  TREATMENT  [facility] Adult PT Treatment:                                                 DATE: 10/03/23 Pt seen for aquatic therapy today.  Treatment took place in water 3.5-4.75 ft in depth at the [facility]  pool. Temp of water was 91.  Pt entered/exited the pool via stairs using step to pattern with hand rail.   Exercises - Hand buoy carry: Forward and Backward; bilaterally->unilaterally  - 1 x daily - 1-3 x weekly - Side Stepping  - 1 x daily - 1-3 x weekly - Side lunge with hand buoys  - 1 x daily - 1-3 x weekly - Heel Toe Raises at Pool Wall  - 1 x daily - 1-2 x weekly - 1-2 sets - 10 reps - Kick Leg Out To The Side  - 1 x daily - 1-3 x weekly - 1-2 sets - 10 reps - Leg swings flex/ext  - 1 x daily - 1-3 x weekly - 1-2 sets - 10 reps - Standing Hip Hinge  - 1 x daily - 1-3 x weekly - 1-2 sets - 10 reps - Squat  - 1 x daily - 1-3 x weekly - 1-2 sets - 10 reps - Noodle Press  - 1 x daily - 1-3 x weekly - 1-2 sets - 10 reps - Cycling on noodle/noodle wrapped under shoulders  - 1 x daily - 1-3 x weekly   Pt requires the buoyancy and hydrostatic pressure of water for support, and to offload joints by unweighting joint load by at least 50 % in navel deep water and by at least 75-80% in chest to neck deep water.  Viscosity of the water is needed for resistance of strengthening. Water current perturbations provides challenge to standing balance requiring increased core activation.  [facility] Adult PT Treatment:                                                DATE: 10/03/23 Pt seen for aquatic therapy today.  Treatment took place in water 3.5-4.75 ft in depth at the [facility] pool. Temp of water was 91.  Pt entered/exited the pool via stairs using step to pattern with hand rail.  *unsupported walking forward/ backward with reciprocal arm swing, multiple laps  *HB carry yellow forward back and side bilaterally then unilaterally * side stepping  with arm abdct/ addct yellow HB->side slight lunge * L stretch at wall  * hip hinge with forward arm reach  with yellow hand floats x 10 * UE support yellow HB: toe raises; heel raises Hip abdct/ addct * TrA set with hollow noodle pull down to thighs x 10, repeated in staggered stance x 5 each * marching with row motion (bilat/ alternating) with yellow hand floats on top of water *cycling (noodle wrapped anteriorly) for decompression - brief rest at corners for LE to dangle   Pt requires the buoyancy and hydrostatic pressure of water for support, and to offload joints by unweighting joint load by at least 50 % in navel deep water and by at least 75-80% in chest to neck deep water.  Viscosity of the water is needed for resistance of strengthening. Water current perturbations provides challenge to standing balance requiring increased core activation.  09/04/23 LTR 10 x 5 second holds Prone hip extension 2 x 10 SLR with ab set 1 x 10 Standing row RTB 2 x 15 Standing shoulder extension RTB 2 x 12 Standing palof press 1 x 10  STS 1 x 10  [facility] Adult PT Treatment:  DATE: 09/19/23 Pt seen for aquatic therapy today.  Treatment took place in water 3.5-4.75 ft in depth at the [facility] pool. Temp of water was 91.  Pt entered/exited the pool via stairs using step to pattern with hand rail.  *unsupported walking forward/ backward with reciprocal arm swing, multiple laps  * L stretch at wall ; L stretch with tail wagging * farmers carry with bilateral then single yellow hand floats - marching forward and back  * hip hinge with forward arm reach with yellow hand floats x 10 * side stepping -> with arm abdct/ addct -> with rainbow hand floats *resisted row using rider band 2 x 10.  Cues for tight core. * UE support on yellow HB->unsupported: Hip abdct/ addct x 10 ; traditional squat x 8; hip flex/ext x 8 *cycling on noodle for decompression and unloaded * L stretch at wall and walking in between exercises as needed for recovery     Pt requires the  buoyancy and hydrostatic pressure of water for support, and to offload joints by unweighting joint load by at least 50 % in navel deep water and by at least 75-80% in chest to neck deep water.  Viscosity of the water is needed for resistance of strengthening. Water current perturbations provides challenge to standing balance requiring increased core activation.     09/17/23 LTR 10 x 5 second holds Ab set 5 x 5 second holds March with ab set 3 x 10 Bridge 2 x 10  Standing hip abduction 2 x 10  Standing hip extension 2 x 10 Lateral stepping 6 x 10 feet Seated trunk flexion stretch with green ball 2 x 10 x 5 second holds Nustep seat 7 level 3 5 minutes for conditioning   PATIENT EDUCATION:  Education details: Person educated: Patient Education method: Explanation Education comprehension: verbalized understanding  HOME EXERCISE PROGRAM: Verbally assigned standing hip ext stretch 20s x 2, and "L" stretch x 20s  Access Code: [email or web address] URL: [email or web address]  Access Code: [email or web address] URL: [email or web address] Date: 10/03/2023 Prepared by: Frankie Rainelle Sulewski  Exercises - Hand buoy carry: Forward and Backward; bilaterally->unilaterally  - 1 x daily - 1-3 x weekly - Side Stepping  - 1 x daily - 1-3 x weekly - Side lunge with hand buoys  - 1 x daily - 1-3 x weekly - Heel Toe Raises at Pool Wall  - 1 x daily - 1-2 x weekly - 1-2 sets - 10 reps - Kick Leg Out To The Side  - 1 x daily - 1-3 x weekly - 1-2 sets - 10 reps - Leg swings flex/ext  - 1 x daily - 1-3 x weekly - 1-2 sets - 10 reps - Standing Hip Hinge  - 1 x daily - 1-3 x weekly - 1-2 sets - 10 reps - Squat  - 1 x daily - 1-3 x weekly - 1-2 sets - 10 reps - Noodle Press  - 1 x daily - 1-3 x weekly - 1-2 sets - 10 reps - Cycling on noodle/noodle wrapped under shoulders  - 1 x daily - 1-3 x weekly * not issued yet  ASSESSMENT:  CLINICAL IMPRESSION: Pt reports a 50% reduction in pain since onset of  therapy. She will be gaining pool access in next 1-2 weeks. Final aquatic HEP created with beginning instruction.  Not yet issued.  She demonstrates improving pain management technique with stopping throughout session to stretch indep (L stretch).  Advanced balance challenges decreasing ue support with le swings.  No LOB. No reports of pain, good core engagement.  Reports community amb toleration walking through grocery store for 30 minutes.  She will have met her max potential in setting next session.  Will re-assess to determine need for continued land based intervention.   Pt requests to work on stairs with cane during next land appt.  Therapist to ck TUG next session, as time allows and 5x STS      Initial Impression Patient is a 53 y.o. f who was seen today for physical therapy evaluation and treatment for lumbar stenosis/LBP. She does have intermittent radicular pain into her lle to knee with extended walking. Reports reduction of pain after steroid injection a few month ago.  Slump test neg.  Hip flex and abduction strength is fairly good although resistance increases lbp, posterior core strength lacking.  Lumbar ROM also good but experiences pain in LB at end ranges. Her biggest complaint is ability to tolerate amb as pain begins immediately once up and gait initiated and is limited after 5 mins. She does use a cane out of home.  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 She is going to [facility] in December and wants to improve her walking as much as able.  She will benefit from skilled physical therapy intervention beginning in aquatics to initiate reduction and improved management of pain and strengthening/ stretching post core using the properties of water.  Will transition to land based as tolerated.  OBJECTIVE IMPAIRMENTS: Abnormal gait, decreased activity tolerance, decreased balance, decreased endurance, difficulty walking, decreased strength, obesity, and pain.   ACTIVITY LIMITATIONS: carrying,  lifting, bending, sitting, standing, stairs, transfers, and locomotion level  PARTICIPATION LIMITATIONS: meal prep, cleaning, laundry, shopping, community activity, and yard work  PERSONAL FACTORS: Fitness and 1-2 comorbidities: see problem list are also affecting patient's functional outcome.   REHAB POTENTIAL: Good  CLINICAL DECISION MAKING: Evolving/moderate complexity  EVALUATION COMPLEXITY: Moderate   GOALS: Goals reviewed with patient? Yes  SHORT TERM GOALS: Target date: 09/05/23  Pt will tolerate full aquatic sessions consistently without increase in pain and with improving function to demonstrate good toleration and effectiveness of intervention.  Baseline: Goal status: Met 09/02/23  2.  Pt will report feeling "looser" in hips and back with stretching submerged (personal goal) Baseline: tight Goal status: Met 09/02/23  3.  Pt will improve on Tug test to <or=13.5 (community dwelling adults) to demonstrate improvement in lower extremity function, mobility and decreased fall risk. Baseline: 15.42 Goal status: INITIAL  LONG TERM GOALS: Target date: 10/11/23  Pt to improve on ODI to below 20% to demonstrate statistically significant Improvement in function. Baseline: 17/45=37% Goal status: INITIAL  2.  Pt will report  a 75% reduction in LBP Baseline: 50% 10/03/23 Goal status: In progress  3.  Pt will improve on 5 X STS test to <or= 17s ([facility]) to demonstrate improving functional lower extremity strength, transitional movements, and balance Baseline: 21.88 Goal status: INITIAL  4.  Pt will perform full lumbar ROM without pain Baseline: see chart Goal status: INITIAL  5.  Pt will report amb community distances without limitation to pain (up to 2000 ft) with AD as needed Baseline:  Goal status: Met 10/03/23    PLAN:  PT FREQUENCY: 1-2x/week  PT DURATION: 8 weeks  PLANNED INTERVENTIONS: 97164- PT Re-evaluation, 97110-Therapeutic exercises, 97530- Therapeutic activity,  97112- Neuromuscular re-education, 97535- Self Care, 16109- Manual therapy, [id number]- Gait training, [phone]- Orthotic Fit/training, [phone]- Aquatic Therapy, [phone]- Electrical stimulation (unattended), [phone]- Ionotophoresis 4mg /ml  Dexamethasone , Patient/Family education, Balance training, Stair training, Taping, Dry Needling, Joint mobilization, DME instructions, Cryotherapy, and Moist heat.  PLAN FOR NEXT SESSION: aquatics: glut and posterior core strengthening; gait. Balance stretching LE and lumbar spine   Adriana Hopping Laneta Pintos) Marquest Gunkel MPT 10/03/23 8:08 AM [facility] [address] [place], [place], [address] Phone: [phone]   Fax:  [phone]

## 2023-10-08 ENCOUNTER — Encounter (HOSPITAL_BASED_OUTPATIENT_CLINIC_OR_DEPARTMENT_OTHER): Payer: Self-pay | Admitting: Physical Therapy

## 2023-10-08 ENCOUNTER — Ambulatory Visit (HOSPITAL_BASED_OUTPATIENT_CLINIC_OR_DEPARTMENT_OTHER): Admitting: Physical Therapy

## 2023-10-08 DIAGNOSIS — M6281 Muscle weakness (generalized): Secondary | ICD-10-CM

## 2023-10-08 DIAGNOSIS — M5459 Other low back pain: Secondary | ICD-10-CM

## 2023-10-08 NOTE — Therapy (Signed)
 OUTPATIENT PHYSICAL THERAPY THORACOLUMBAR TREATMENT   Patient Name: Audrey Goodman MRN: [id number] DOB:Mar 26, 1971, 53 y.o., female Today's Date: 10/08/2023  END OF SESSION:  PT End of Session - 10/08/23 0849     Visit Number 13    Number of Visits 16    Date for PT Re-Evaluation 10/11/23    Authorization Type [employer]    PT Start Time 0849    PT Stop Time 0929    PT Time Calculation (min) 40 min    Activity Tolerance Patient tolerated treatment well    Behavior During Therapy WFL for tasks assessed/performed             Past Medical History:  Diagnosis Date   Anxiety    Arthritis    left shoulder   Back pain    Chronic kidney disease    stage 1   Depression    Diabetes mellitus without complication (HCC)    type 2- on meds   Diverticulitis    High cholesterol    History of chicken pox    Hyperlipidemia    on meds   Hypertension    on meds   Joint pain    Lactose intolerance    PCOS (polycystic ovarian syndrome)    Seasonal allergies    Shingles    Sleep apnea    does NOT use CPAP   SVD (spontaneous vaginal delivery) 1995   x 1   Type 2 diabetes mellitus, uncontrolled 07/30/2006   Qualifier: Diagnosis of  By: Redia Candela  MD, Roz Cornelia     Past Surgical History:  Procedure Laterality Date   ANKLE FRACTURE SURGERY  05/28/1989   right   CHOLECYSTECTOMY  05/28/2006   COLONOSCOPY  1989   due to blood in stool and diverticulitis per pt   HYSTEROSCOPY N/A 09/13/2014   Procedure: HYSTEROSCOPY with IUD removal;  Surgeon: Dyanna Glasgow, DO;  Location: [facility];  Service: Gynecology;  Laterality: N/A;  need longest speculum and instruments that aresavailable due to patient's habitus (BMI ~60)   Patient Active Problem List   Diagnosis Date Noted   Snoring 07/10/2023   Insomnia 02/20/2023   Attention deficit hyperactivity disorder, combined type, moderate 03/01/2022   Impacted cerumen of both ears 01/23/2021   Left Achilles tendinitis 06/19/2017   Depression 07/13/2015    Preventative health care 06/23/2014   PCOS (polycystic ovarian syndrome) 09/10/2013   CHOLELITHIASIS 02/20/2008   Hyperlipidemia 08/06/2006   Uncontrolled diabetes mellitus with hyperglycemia (HCC) 07/30/2006   HYPERTENSION, BENIGN ESSENTIAL 07/30/2006   MORBID OBESITY 07/25/2006    PCP: Dorrene Gaucher NP  REFERRING PROVIDER:   Shauna Del, DO    REFERRING DIAG:  [phone] (ICD-10-CM) - Foraminal stenosis of lumbar region  M54.42,G89.29 (ICD-10-CM) - Chronic left-sided low back pain with left-sided sciatica    Rationale for Evaluation and Treatment: Rehabilitation  THERAPY DIAG:  Other low back pain  Muscle weakness (generalized)  ONSET DATE: April 2024  SUBJECTIVE:  SUBJECTIVE STATEMENT: Patient states hip is sore, possibly from weather. Going to be coming to gym starting this week. Will likely d/c to HEP next session due to finances.   POOL ACCESS: has pool in apt complex open Memorial Day to Labor Day. Pt reports she plans to attain membership at d/c.   Initial subjective I was told I had left hip OA but minimal, shouldn't be causing as much pain as I am having.  Had shot in L3 which pulled my pain down (Oct 2024).  Had another in L4 without relief (Jan 2025).  Pain is daily, occasionally waking at night. Started when I began walking more a little over 1 year ago at [employer].  Have been up a down since  PERTINENT HISTORY:  Left shoulder OA, chronic kidney disease, type 2 DM, HLD, right ankle fracture '91, morbid obesity   PAIN:  Are you having pain? no: NPRS scale: 4/10 Pain location: L hip Pain description: ache intermittent Aggravating factors: walking few minutes; sitting > 1 hour Relieving factors: sitting, standing  PRECAUTIONS: None  RED FLAGS: None   WEIGHT BEARING  RESTRICTIONS: No  FALLS:  Has patient fallen in last 6 months? No  LIVING ENVIRONMENT: Lives with: lives alone Lives in: House/apartment Stairs:  Avoids stairs Has following equipment at home: Single point cane  OCCUPATION: Claims specialist, works from home on a computer   PLOF: Independent  PATIENT GOALS: get looser in hips, decrease pain in back, going back to [place] in Dec  NEXT MD VISIT: as needed  OBJECTIVE:  Note: Objective measures were completed at Evaluation unless otherwise noted.  DIAGNOSTIC FINDINGS:  None in chart  PATIENT SURVEYS:  Modified Oswestry 17/45=37%   COGNITION: Overall cognitive status: Within functional limits for tasks assessed     SENSATION: WFL  MUSCLE LENGTH: Hamstrings: WFL tested in sitting    POSTURE: rounded shoulders and increased lumbar lordosis  PALPATION: TTP slight left IT band, lumbar paraspinals  LUMBAR ROM:   AROM eval  Flexion full  Extension Full P!  Right lateral flexion Full P!  Left lateral flexion full  Right rotation   Left rotation    (Blank rows = not tested)  LOWER EXTREMITY ROM:     wfl  LOWER EXTREMITY MMT:    MMT Right eval Left eval  Hip flexion 50.6 53.9  Hip extension    Hip abduction 33.2 37.6  /Hip adduction    Hip internal rotation    Hip external rotation    Knee flexion    Knee extension    Ankle dorsiflexion    Ankle plantarflexion    Ankle inversion    Ankle eversion     (Blank rows = not tested)  LUMBAR SPECIAL TESTS:  Straight leg raise test: Negative and Slump test: Negative  FUNCTIONAL TESTS:  5 times sit to stand: 21.88 Timed up and go (TUG): 15.42 4 stage balance: 1&2 passed; tandem x 10s; SLS x2s  GAIT: Distance walked: 400 ft Assistive device utilized: Single point cane Level of assistance: Complete Independence Comments: slight bilateral hip trendelenburg   TREATMENT 10/08/23 Gait/ stairs with SPC - stairs 7 inch tends to use step too pattern with  RLE, labored with heavy UE support with alternating, apprehensive Step up 4 inch 2 x 10 STS 2 x 10 Lateral step up 4 inch 2 x 10  Standing hip abduction RTB at knees 2 x 10    [facility] Adult PT Treatment:  DATE: 10/03/23 Pt seen for aquatic therapy today.  Treatment took place in water 3.5-4.75 ft in depth at the [facility] pool. Temp of water was 91.  Pt entered/exited the pool via stairs using step to pattern with hand rail.   Exercises - Hand buoy carry: Forward and Backward; bilaterally->unilaterally  - 1 x daily - 1-3 x weekly - Side Stepping  - 1 x daily - 1-3 x weekly - Side lunge with hand buoys  - 1 x daily - 1-3 x weekly - Heel Toe Raises at Pool Wall  - 1 x daily - 1-2 x weekly - 1-2 sets - 10 reps - Kick Leg Out To The Side  - 1 x daily - 1-3 x weekly - 1-2 sets - 10 reps - Leg swings flex/ext  - 1 x daily - 1-3 x weekly - 1-2 sets - 10 reps - Standing Hip Hinge  - 1 x daily - 1-3 x weekly - 1-2 sets - 10 reps - Squat  - 1 x daily - 1-3 x weekly - 1-2 sets - 10 reps - Noodle Press  - 1 x daily - 1-3 x weekly - 1-2 sets - 10 reps - Cycling on noodle/noodle wrapped under shoulders  - 1 x daily - 1-3 x weekly   Pt requires the buoyancy and hydrostatic pressure of water for support, and to offload joints by unweighting joint load by at least 50 % in navel deep water and by at least 75-80% in chest to neck deep water.  Viscosity of the water is needed for resistance of strengthening. Water current perturbations provides challenge to standing balance requiring increased core activation.  [facility] Adult PT Treatment:                                                DATE: 10/03/23 Pt seen for aquatic therapy today.  Treatment took place in water 3.5-4.75 ft in depth at the [facility] pool. Temp of water was 91.  Pt entered/exited the pool via stairs using step to pattern with hand rail.  *unsupported walking forward/ backward with  reciprocal arm swing, multiple laps  *HB carry yellow forward back and side bilaterally then unilaterally * side stepping  with arm abdct/ addct yellow HB->side slight lunge * L stretch at wall  * hip hinge with forward arm reach with yellow hand floats x 10 * UE support yellow HB: toe raises; heel raises Hip abdct/ addct * TrA set with hollow noodle pull down to thighs x 10, repeated in staggered stance x 5 each * marching with row motion (bilat/ alternating) with yellow hand floats on top of water *cycling (noodle wrapped anteriorly) for decompression - brief rest at corners for LE to dangle   Pt requires the buoyancy and hydrostatic pressure of water for support, and to offload joints by unweighting joint load by at least 50 % in navel deep water and by at least 75-80% in chest to neck deep water.  Viscosity of the water is needed for resistance of strengthening. Water current perturbations provides challenge to standing balance requiring increased core activation.  09/04/23 LTR 10 x 5 second holds Prone hip extension 2 x 10 SLR with ab set 1 x 10 Standing row RTB 2 x 15 Standing shoulder extension RTB 2 x 12 Standing palof press 1 x 10  STS 1 x 10  [facility] Adult PT Treatment:                                                DATE: 09/19/23 Pt seen for aquatic therapy today.  Treatment took place in water 3.5-4.75 ft in depth at the [facility] pool. Temp of water was 91.  Pt entered/exited the pool via stairs using step to pattern with hand rail.  *unsupported walking forward/ backward with reciprocal arm swing, multiple laps  * L stretch at wall ; L stretch with tail wagging * farmers carry with bilateral then single yellow hand floats - marching forward and back  * hip hinge with forward arm reach with yellow hand floats x 10 * side stepping -> with arm abdct/ addct -> with rainbow hand floats *resisted row using rider band 2 x 10.  Cues for tight core. * UE support on yellow  HB->unsupported: Hip abdct/ addct x 10 ; traditional squat x 8; hip flex/ext x 8 *cycling on noodle for decompression and unloaded * L stretch at wall and walking in between exercises as needed for recovery     Pt requires the buoyancy and hydrostatic pressure of water for support, and to offload joints by unweighting joint load by at least 50 % in navel deep water and by at least 75-80% in chest to neck deep water.  Viscosity of the water is needed for resistance of strengthening. Water current perturbations provides challenge to standing balance requiring increased core activation.     09/17/23 LTR 10 x 5 second holds Ab set 5 x 5 second holds March with ab set 3 x 10 Bridge 2 x 10  Standing hip abduction 2 x 10  Standing hip extension 2 x 10 Lateral stepping 6 x 10 feet Seated trunk flexion stretch with green ball 2 x 10 x 5 second holds Nustep seat 7 level 3 5 minutes for conditioning   PATIENT EDUCATION:  Education details: 10/08/23: HEP, steps, step progression, walking program Person educated: Patient Education method: Explanation Education comprehension: verbalized understanding  HOME EXERCISE PROGRAM: Verbally assigned standing hip ext stretch 20s x 2, and "L" stretch x 20s  Access Code: [email or web address] URL: [email or web address]  Access Code: [email or web address] URL: [email or web address] Date: 10/03/2023 Prepared by: Frankie Ziemba  Exercises - Hand buoy carry: Forward and Backward; bilaterally->unilaterally  - 1 x daily - 1-3 x weekly - Side Stepping  - 1 x daily - 1-3 x weekly - Side lunge with hand buoys  - 1 x daily - 1-3 x weekly - Heel Toe Raises at Pool Wall  - 1 x daily - 1-2 x weekly - 1-2 sets - 10 reps - Kick Leg Out To The Side  - 1 x daily - 1-3 x weekly - 1-2 sets - 10 reps - Leg swings flex/ext  - 1 x daily - 1-3 x weekly - 1-2 sets - 10 reps - Standing Hip Hinge  - 1 x daily - 1-3 x weekly - 1-2 sets - 10 reps - Squat  - 1 x daily -  1-3 x weekly - 1-2 sets - 10 reps - Noodle Press  - 1 x daily - 1-3 x weekly - 1-2 sets - 10 reps - Cycling on noodle/noodle wrapped under shoulders  - 1 x daily - 1-3 x weekly * not issued yet  ASSESSMENT:  CLINICAL IMPRESSION: Discussed POC with patient and she will likely d/c to HEP next session. Review of stairs with patient and educated on beginning with 4 inch step and progressions going forward. Additional functional strengthening performed well. Fatigue noted 8/10 at EOS. Patient will continue to benefit from physical therapy in order to improve function and reduce impairment.    Pt requests to work on stairs with cane during next land appt.  Therapist to ck TUG next session, as time allows and 5x STS      Initial Impression Patient is a 53 y.o. f who was seen today for physical therapy evaluation and treatment for lumbar stenosis/LBP. She does have intermittent radicular pain into her lle to knee with extended walking. Reports reduction of pain after steroid injection a few month ago.  Slump test neg.  Hip flex and abduction strength is fairly good although resistance increases lbp, posterior core strength lacking.  Lumbar ROM also good but experiences pain in LB at end ranges. Her biggest complaint is ability to tolerate amb as pain begins immediately once up and gait initiated and is limited after 5 mins. She does use a cane out of home.  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 She is going to [facility] in December and wants to improve her walking as much as able.  She will benefit from skilled physical therapy intervention beginning in aquatics to initiate reduction and improved management of pain and strengthening/ stretching post core using the properties of water.  Will transition to land based as tolerated.  OBJECTIVE IMPAIRMENTS: Abnormal gait, decreased activity tolerance, decreased balance, decreased endurance, difficulty walking, decreased strength, obesity, and pain.   ACTIVITY  LIMITATIONS: carrying, lifting, bending, sitting, standing, stairs, transfers, and locomotion level  PARTICIPATION LIMITATIONS: meal prep, cleaning, laundry, shopping, community activity, and yard work  PERSONAL FACTORS: Fitness and 1-2 comorbidities: see problem list are also affecting patient's functional outcome.   REHAB POTENTIAL: Good  CLINICAL DECISION MAKING: Evolving/moderate complexity  EVALUATION COMPLEXITY: Moderate   GOALS: Goals reviewed with patient? Yes  SHORT TERM GOALS: Target date: 09/05/23  Pt will tolerate full aquatic sessions consistently without increase in pain and with improving function to demonstrate good toleration and effectiveness of intervention.  Baseline: Goal status: Met 09/02/23  2.  Pt will report feeling "looser" in hips and back with stretching submerged (personal goal) Baseline: tight Goal status: Met 09/02/23  3.  Pt will improve on Tug test to <or=13.5 (community dwelling adults) to demonstrate improvement in lower extremity function, mobility and decreased fall risk. Baseline: 15.42 Goal status: INITIAL  LONG TERM GOALS: Target date: 10/11/23  Pt to improve on ODI to below 20% to demonstrate statistically significant Improvement in function. Baseline: 17/45=37% Goal status: INITIAL  2.  Pt will report  a 75% reduction in LBP Baseline: 50% 10/03/23 Goal status: In progress  3.  Pt will improve on 5 X STS test to <or= 17s ([facility]) to demonstrate improving functional lower extremity strength, transitional movements, and balance Baseline: 21.88 Goal status: INITIAL  4.  Pt will perform full lumbar ROM without pain Baseline: see chart Goal status: INITIAL  5.  Pt will report amb community distances without limitation to pain (up to 2000 ft) with AD as needed Baseline:  Goal status: Met 10/03/23    PLAN:  PT FREQUENCY: 1-2x/week  PT DURATION: 8 weeks  PLANNED INTERVENTIONS: 97164- PT Re-evaluation, 97110-Therapeutic exercises, 97530-  Therapeutic activity, 97112- Neuromuscular re-education, 97535- Self Care, 16109- Manual  therapy, [id number]- Gait training, 91478- Orthotic Fit/training, 29562- Aquatic Therapy, [phone]- Electrical stimulation (unattended), [phone]- Ionotophoresis 4mg /ml Dexamethasone , Patient/Family education, Balance training, Stair training, Taping, Dry Needling, Joint mobilization, DME instructions, Cryotherapy, and Moist heat.  PLAN FOR NEXT SESSION: aquatics: glut and posterior core strengthening; gait. Balance stretching LE and lumbar spine    Beather Liming, PT, DPT 10/08/2023, 8:50 AM  [facility] [address] [place], [place], [address] Phone: [phone]   Fax:  [phone]

## 2023-10-10 ENCOUNTER — Ambulatory Visit (HOSPITAL_BASED_OUTPATIENT_CLINIC_OR_DEPARTMENT_OTHER): Admitting: Physical Therapy

## 2023-10-10 ENCOUNTER — Encounter (HOSPITAL_BASED_OUTPATIENT_CLINIC_OR_DEPARTMENT_OTHER): Payer: Self-pay | Admitting: Physical Therapy

## 2023-10-10 DIAGNOSIS — R262 Difficulty in walking, not elsewhere classified: Secondary | ICD-10-CM

## 2023-10-10 DIAGNOSIS — M5459 Other low back pain: Secondary | ICD-10-CM | POA: Diagnosis not present

## 2023-10-10 DIAGNOSIS — M6281 Muscle weakness (generalized): Secondary | ICD-10-CM

## 2023-10-10 NOTE — Therapy (Signed)
 OUTPATIENT PHYSICAL THERAPY THORACOLUMBAR TREATMENT  PHYSICAL THERAPY DISCHARGE SUMMARY  Visits from Start of Care: 14  Current functional level related to goals / functional outcomes: indep   Remaining deficits: Chronic condition/pain   Education / Equipment: Management of condition/HEP's land and aquatic   Patient agrees to discharge. Patient goals were partially met. Patient is being discharged due to maximized rehab potential.   Patient Name: Audrey Goodman MRN: [id number] DOB:14-Jan-1971, 53 y.o., female Today's Date: 10/10/2023  END OF SESSION:  PT End of Session - 10/10/23 0815     Visit Number 15    Number of Visits 16    Date for PT Re-Evaluation 10/11/23    Authorization Type UHC    PT Start Time 0800    PT Stop Time 0840    PT Time Calculation (min) 40 min    Activity Tolerance Patient tolerated treatment well    Behavior During Therapy WFL for tasks assessed/performed              Past Medical History:  Diagnosis Date   Anxiety    Arthritis    left shoulder   Back pain    Chronic kidney disease    stage 1   Depression    Diabetes mellitus without complication (HCC)    type 2- on meds   Diverticulitis    High cholesterol    History of chicken pox    Hyperlipidemia    on meds   Hypertension    on meds   Joint pain    Lactose intolerance    PCOS (polycystic ovarian syndrome)    Seasonal allergies    Shingles    Sleep apnea    does NOT use CPAP   SVD (spontaneous vaginal delivery) 1995   x 1   Type 2 diabetes mellitus, uncontrolled 07/30/2006   Qualifier: Diagnosis of  By: Redia Candela  MD, Roz Cornelia     Past Surgical History:  Procedure Laterality Date   ANKLE FRACTURE SURGERY  05/28/1989   right   CHOLECYSTECTOMY  05/28/2006   COLONOSCOPY  1989   due to blood in stool and diverticulitis per pt   HYSTEROSCOPY N/A 09/13/2014   Procedure: HYSTEROSCOPY with IUD removal;  Surgeon: Dyanna Glasgow, DO;  Location: [facility];  Service: Gynecology;   Laterality: N/A;  need longest speculum and instruments that aresavailable due to patient's habitus (BMI ~60)   Patient Active Problem List   Diagnosis Date Noted   Snoring 07/10/2023   Insomnia 02/20/2023   Attention deficit hyperactivity disorder, combined type, moderate 03/01/2022   Impacted cerumen of both ears 01/23/2021   Left Achilles tendinitis 06/19/2017   Depression 07/13/2015   Preventative health care 06/23/2014   PCOS (polycystic ovarian syndrome) 09/10/2013   CHOLELITHIASIS 02/20/2008   Hyperlipidemia 08/06/2006   Uncontrolled diabetes mellitus with hyperglycemia (HCC) 07/30/2006   HYPERTENSION, BENIGN ESSENTIAL 07/30/2006   MORBID OBESITY 07/25/2006    PCP: Dorrene Gaucher NP  REFERRING PROVIDER:   Shauna Del, DO    REFERRING DIAG:  [phone] (ICD-10-CM) - Foraminal stenosis of lumbar region  M54.42,G89.29 (ICD-10-CM) - Chronic left-sided low back pain with left-sided sciatica    Rationale for Evaluation and Treatment: Rehabilitation  THERAPY DIAG:  Other low back pain  Muscle weakness (generalized)  Difficulty in walking, not elsewhere classified  ONSET DATE: April 2024  SUBJECTIVE:  SUBJECTIVE STATEMENT: Patient states she is going to begin the [facility] at [facility] with a friend.  LBP low   POOL ACCESS: has pool in apt complex open Memorial Day to Labor Day. Pt reports she plans to attain membership at d/c.   Initial subjective I was told I had left hip OA but minimal, shouldn't be causing as much pain as I am having.  Had shot in L3 which pulled my pain down (Oct 2024).  Had another in L4 without relief (Jan 2025).  Pain is daily, occasionally waking at night. Started when I began walking more a little over 1 year ago at [employer].  Have been up a down  since  PERTINENT HISTORY:  Left shoulder OA, chronic kidney disease, type 2 DM, HLD, right ankle fracture '91, morbid obesity   PAIN:  Are you having pain? no: NPRS scale: 4/10 Pain location: L hip Pain description: ache intermittent Aggravating factors: walking few minutes; sitting > 1 hour Relieving factors: sitting, standing  PRECAUTIONS: None  RED FLAGS: None   WEIGHT BEARING RESTRICTIONS: No  FALLS:  Has patient fallen in last 6 months? No  LIVING ENVIRONMENT: Lives with: lives alone Lives in: House/apartment Stairs:  Avoids stairs Has following equipment at home: Single point cane  OCCUPATION: Claims specialist, works from home on a computer   PLOF: Independent  PATIENT GOALS: get looser in hips, decrease pain in back, going back to [place] in Dec  NEXT MD VISIT: as needed  OBJECTIVE:  Note: Objective measures were completed at Evaluation unless otherwise noted.  DIAGNOSTIC FINDINGS:  None in chart  PATIENT SURVEYS:  Modified Oswestry 17/45=37%  10/10/23: 34%  COGNITION: Overall cognitive status: Within functional limits for tasks assessed     SENSATION: WFL  MUSCLE LENGTH: Hamstrings: WFL tested in sitting    POSTURE: rounded shoulders and increased lumbar lordosis  PALPATION: TTP slight left IT band, lumbar paraspinals  LUMBAR ROM:   AROM eval 10/10/23  Flexion full   Extension Full P! Full no P!  Right lateral flexion Full P! Full no P!  Left lateral flexion full   Right rotation    Left rotation     (Blank rows = not tested)  LOWER EXTREMITY ROM:     wfl  LOWER EXTREMITY MMT:    MMT Right eval Left eval  Hip flexion 50.6 53.9  Hip extension    Hip abduction 33.2 37.6  /Hip adduction    Hip internal rotation    Hip external rotation    Knee flexion    Knee extension    Ankle dorsiflexion    Ankle plantarflexion    Ankle inversion    Ankle eversion     (Blank rows = not tested)  LUMBAR SPECIAL TESTS:  Straight leg  raise test: Negative and Slump test: Negative  FUNCTIONAL TESTS:  5 times sit to stand: 21.88 Timed up and go (TUG): 15.42 4 stage balance: 1&2 passed; tandem x 10s; SLS x2s  10/10/23: TUG: 12.56      5x STS: 13.40  GAIT: Distance walked: 400 ft Assistive device utilized: Single point cane Level of assistance: Complete Independence Comments: slight bilateral hip trendelenburg   TREATMENT DATE: 10/10/23 Pt seen for aquatic therapy today.  Treatment took place in water 3.5-4.75 ft in depth at the [facility] pool. Temp of water was 91.  Pt entered/exited the pool via stairs using step to pattern with hand rail.   Exercises - Hand buoy carry: Forward and  Backward; bilaterally->unilaterally  - Side Stepping   - Side lunge with hand buoys  - Heel Toe Raises at [employer]  - Kick Leg Out To The Side  - Leg swings flex/ext   - Standing Hip Hinge  - Squat   - Noodle Press  - Cycling on noodle/noodle wrapped under shoulders    Pt requires the buoyancy and hydrostatic pressure of water for support, and to offload joints by unweighting joint load by at least 50 % in navel deep water and by at least 75-80% in chest to neck deep water.  Viscosity of the water is needed for resistance of strengthening. Water current perturbations provides challenge to standing balance requiring increased core activation.  10/08/23 Gait/ stairs with SPC - stairs 7 inch tends to use step too pattern with RLE, labored with heavy UE support with alternating, apprehensive Step up 4 inch 2 x 10 STS 2 x 10 Lateral step up 4 inch 2 x 10  Standing hip abduction RTB at knees 2 x 10    [facility] Adult PT Treatment:                                                DATE: 10/03/23 Pt seen for aquatic therapy today.  Treatment took place in water 3.5-4.75 ft in depth at the [facility] pool. Temp of water was 91.  Pt entered/exited the pool via stairs using step to pattern with hand rail.   Exercises -  Hand buoy carry: Forward and Backward; bilaterally->unilaterally  - 1 x daily - 1-3 x weekly - Side Stepping  - 1 x daily - 1-3 x weekly - Side lunge with hand buoys  - 1 x daily - 1-3 x weekly - Heel Toe Raises at Pool Wall  - 1 x daily - 1-2 x weekly - 1-2 sets - 10 reps - Kick Leg Out To The Side  - 1 x daily - 1-3 x weekly - 1-2 sets - 10 reps - Leg swings flex/ext  - 1 x daily - 1-3 x weekly - 1-2 sets - 10 reps - Standing Hip Hinge  - 1 x daily - 1-3 x weekly - 1-2 sets - 10 reps - Squat  - 1 x daily - 1-3 x weekly - 1-2 sets - 10 reps - Noodle Press  - 1 x daily - 1-3 x weekly - 1-2 sets - 10 reps - Cycling on noodle/noodle wrapped under shoulders  - 1 x daily - 1-3 x weekly   Pt requires the buoyancy and hydrostatic pressure of water for support, and to offload joints by unweighting joint load by at least 50 % in navel deep water and by at least 75-80% in chest to neck deep water.  Viscosity of the water is needed for resistance of strengthening. Water current perturbations provides challenge to standing balance requiring increased core activation.  [facility] Adult PT Treatment:                                                DATE: 10/03/23 Pt seen for aquatic therapy today.  Treatment took place in water 3.5-4.75 ft in depth at the [facility] pool. Temp of water was 91.  Pt  entered/exited the pool via stairs using step to pattern with hand rail.  *unsupported walking forward/ backward with reciprocal arm swing, multiple laps  *HB carry yellow forward back and side bilaterally then unilaterally * side stepping  with arm abdct/ addct yellow HB->side slight lunge * L stretch at wall  * hip hinge with forward arm reach with yellow hand floats x 10 * UE support yellow HB: toe raises; heel raises Hip abdct/ addct * TrA set with hollow noodle pull down to thighs x 10, repeated in staggered stance x 5 each * marching with row motion (bilat/ alternating) with yellow hand floats on top of  water *cycling (noodle wrapped anteriorly) for decompression - brief rest at corners for LE to dangle   Pt requires the buoyancy and hydrostatic pressure of water for support, and to offload joints by unweighting joint load by at least 50 % in navel deep water and by at least 75-80% in chest to neck deep water.  Viscosity of the water is needed for resistance of strengthening. Water current perturbations provides challenge to standing balance requiring increased core activation.  09/04/23 LTR 10 x 5 second holds Prone hip extension 2 x 10 SLR with ab set 1 x 10 Standing row RTB 2 x 15 Standing shoulder extension RTB 2 x 12 Standing palof press 1 x 10  STS 1 x 10  [facility] Adult PT Treatment:                                                DATE: 09/19/23 Pt seen for aquatic therapy today.  Treatment took place in water 3.5-4.75 ft in depth at the [facility] pool. Temp of water was 91.  Pt entered/exited the pool via stairs using step to pattern with hand rail.  *unsupported walking forward/ backward with reciprocal arm swing, multiple laps  * L stretch at wall ; L stretch with tail wagging * farmers carry with bilateral then single yellow hand floats - marching forward and back  * hip hinge with forward arm reach with yellow hand floats x 10 * side stepping -> with arm abdct/ addct -> with rainbow hand floats *resisted row using rider band 2 x 10.  Cues for tight core. * UE support on yellow HB->unsupported: Hip abdct/ addct x 10 ; traditional squat x 8; hip flex/ext x 8 *cycling on noodle for decompression and unloaded * L stretch at wall and walking in between exercises as needed for recovery     Pt requires the buoyancy and hydrostatic pressure of water for support, and to offload joints by unweighting joint load by at least 50 % in navel deep water and by at least 75-80% in chest to neck deep water.  Viscosity of the water is needed for resistance of strengthening. Water current  perturbations provides challenge to standing balance requiring increased core activation.     09/17/23 LTR 10 x 5 second holds Ab set 5 x 5 second holds March with ab set 3 x 10 Bridge 2 x 10  Standing hip abduction 2 x 10  Standing hip extension 2 x 10 Lateral stepping 6 x 10 feet Seated trunk flexion stretch with green ball 2 x 10 x 5 second holds Nustep seat 7 level 3 5 minutes for conditioning   PATIENT EDUCATION:  Education details: 10/08/23: HEP, steps, step progression,  walking program Person educated: Patient Education method: Explanation Education comprehension: verbalized understanding  HOME EXERCISE PROGRAM: Verbally assigned standing hip ext stretch 20s x 2, and "L" stretch x 20s  Access Code: [email or web address] URL: [email or web address]  Access Code: [email or web address] URL: [email or web address] Date: 10/03/2023 Prepared by: Frankie Arieona Swaggerty  Exercises - Hand buoy carry: Forward and Backward; bilaterally->unilaterally  - 1 x daily - 1-3 x weekly - Side Stepping  - 1 x daily - 1-3 x weekly - Side lunge with hand buoys  - 1 x daily - 1-3 x weekly - Heel Toe Raises at Pool Wall  - 1 x daily - 1-2 x weekly - 1-2 sets - 10 reps - Kick Leg Out To The Side  - 1 x daily - 1-3 x weekly - 1-2 sets - 10 reps - Leg swings flex/ext  - 1 x daily - 1-3 x weekly - 1-2 sets - 10 reps - Standing Hip Hinge  - 1 x daily - 1-3 x weekly - 1-2 sets - 10 reps - Squat  - 1 x daily - 1-3 x weekly - 1-2 sets - 10 reps - Noodle Press  - 1 x daily - 1-3 x weekly - 1-2 sets - 10 reps - Cycling on noodle/noodle wrapped under shoulders  - 1 x daily - 1-3 x weekly   ASSESSMENT:  CLINICAL IMPRESSION: Pt reports indep and compliance with HEP as instructed by land based PT.  She is instructed through final aquatic HEP given minor vc and written clarifications on program.  She demonstrates and verbalized indep. She met most goals.  2 not met but progress made in both areas.  She has  reached her max potential and is ready for DC.  Pt in agreement     OBJECTIVE IMPAIRMENTS: Abnormal gait, decreased activity tolerance, decreased balance, decreased endurance, difficulty walking, decreased strength, obesity, and pain.   ACTIVITY LIMITATIONS: carrying, lifting, bending, sitting, standing, stairs, transfers, and locomotion level  PARTICIPATION LIMITATIONS: meal prep, cleaning, laundry, shopping, community activity, and yard work  PERSONAL FACTORS: Fitness and 1-2 comorbidities: see problem list are also affecting patient's functional outcome.   REHAB POTENTIAL: Good  CLINICAL DECISION MAKING: Evolving/moderate complexity  EVALUATION COMPLEXITY: Moderate   GOALS: Goals reviewed with patient? Yes  SHORT TERM GOALS: Target date: 09/05/23  Pt will tolerate full aquatic sessions consistently without increase in pain and with improving function to demonstrate good toleration and effectiveness of intervention.  Baseline: Goal status: Met 09/02/23  2.  Pt will report feeling "looser" in hips and back with stretching submerged (personal goal) Baseline: tight Goal status: Met 09/02/23  3.  Pt will improve on Tug test to <or=13.5 (community dwelling adults) to demonstrate improvement in lower extremity function, mobility and decreased fall risk. Baseline: 15.42; 13.4s Goal status: Met 10/10/23  LONG TERM GOALS: Target date: 10/11/23  Pt to improve on ODI to below 20% to demonstrate statistically significant Improvement in function. Baseline: 17/45=37%;  34% Goal status: Goal Not Met.  2.  Pt will report  a 75% reduction in LBP Baseline: 50% 10/03/23;  50-60% Goal status: In progress; Goal improved but not met  3.  Pt will improve on 5 X STS test to <or= 17s (MD[facility]) to demonstrate improving functional lower extremity strength, transitional movements, and balance Baseline: 21.88; 12.56 Goal status: Met 10/10/23  4.  Pt will perform full lumbar ROM without pain Baseline: see  chart Goal status: Met 10/10/23  5.  Pt will report amb community distances without  limitation to pain (up to 2000 ft) with AD as needed Baseline:  Goal status: Met 10/03/23    PLAN:  PT FREQUENCY: 1-2x/week  PT DURATION: 8 weeks  PLANNED INTERVENTIONS: 97164- PT Re-evaluation, 97110-Therapeutic exercises, 97530- Therapeutic activity, 97112- Neuromuscular re-education, 97535- Self Care, 44010- Manual therapy, [phone]- Gait training, [phone]- Orthotic Fit/training, [phone]- Aquatic Therapy, [phone]- Electrical stimulation (unattended), [phone]- Ionotophoresis 4mg /ml Dexamethasone , Patient/Family education, Balance training, Stair training, Taping, Dry Needling, Joint mobilization, DME instructions, Cryotherapy, and Moist heat.  PLAN FOR NEXT SESSION: aquatics: glut and posterior core strengthening; gait. Balance stretching LE and lumbar spine    Adriana Hopping Laneta Pintos) Yuliet Needs MPT 10/10/23 8:19 AM [facility] [address] [place], [place], [address] Phone: [phone]   Fax:  [phone]

## 2023-10-11 ENCOUNTER — Ambulatory Visit: Admitting: Sports Medicine

## 2023-10-11 ENCOUNTER — Encounter: Payer: Self-pay | Admitting: Sports Medicine

## 2023-10-11 DIAGNOSIS — G8929 Other chronic pain: Secondary | ICD-10-CM

## 2023-10-11 DIAGNOSIS — M5442 Lumbago with sciatica, left side: Secondary | ICD-10-CM | POA: Diagnosis not present

## 2023-10-11 DIAGNOSIS — M1612 Unilateral primary osteoarthritis, left hip: Secondary | ICD-10-CM

## 2023-10-11 DIAGNOSIS — R2681 Unsteadiness on feet: Secondary | ICD-10-CM

## 2023-10-11 DIAGNOSIS — M48061 Spinal stenosis, lumbar region without neurogenic claudication: Secondary | ICD-10-CM

## 2023-10-11 NOTE — Progress Notes (Signed)
 Patient says she is doing much better. She has had a great experience with aquatic and land therapy, and has gotten a membership with [facility] so that she can continue to use the pool. She hopes to go to the gym twice a week moving forward. She did ween herself off of the Gabapentin 2 weeks ago as she did not notice any relief; she has not had any change in pain since discontinuing. She is taking Celebrex  once daily, although is unsure how much relief she is getting with that medication. She does still have weakness in the left leg and feels that her balance is off; she is still using a cane but would like to stop using it as her pain is improved.

## 2023-10-11 NOTE — Progress Notes (Signed)
 Audrey Goodman - 53 y.o. female MRN [id number]  Date of birth: 11-16-1970  Office Visit Note: Visit Date: 10/11/2023 PCP: Dorrene Gaucher, NP Referred by: Dorrene Gaucher, NP  Subjective: Chief Complaint  Patient presents with   Lower Back - Follow-up   HPI: Audrey Goodman is a pleasant 53 y.o. female who presents today for follow-up of chronic low back pain with lumbar stenosis and L-hip OA.   Audrey Goodman is doing much better.  She has done an excellent job getting involved with both aquatic based physical therapy and land-based therapy at [facility].  She has progressed through this and did graduate with recent discharge.  She noticed good improvement with this and the aquatic therapy was much easier on her joints.  She actually has now joined [facility] and she is planning on participating in an upcoming 9-week workout regimen, starts next Tuesday.  She has been able to wean herself off of gabapentin, not noticing much change in her pain without this.  She still continues her Celebrex  100 mg daily.  She still has some reported weakness in the left leg and feels like her gait is still slightly unstable, but this is improved both stability wise and strength wise with her therapy.  Still using a cane for support/safety net.  Pertinent ROS were reviewed with the patient and found to be negative unless otherwise specified above in HPI.   Assessment & Plan: Visit Diagnoses:  1. Foraminal stenosis of lumbar region   2. Chronic left-sided low back pain with left-sided sciatica   3. Unilateral primary osteoarthritis, left hip   4. Gait instability    Plan: Impression is chronic low back pain with lumbar foraminal stenosis and intermittent left-sided sciatica.  She also has left hip OA and today her exam does suggest a component of left SI joint dysfunction.  She has made excellent strides with all the above with both aquatic and land-based physical therapy.  She has graduated this  but will continue her home exercise regimen and has an upcoming 9-week course at [facility] which I would like to see her further improvement after this.  She was able to discontinue gabapentin, she will stop this going forward.  May continue her Celebrex  100 mg daily.  Her gait instability is improved with her PT and therapy, although still uses a cane which I would like her to have with her more for a safety net if feeling unstable, but will work on relying less on this with general walking.  I will plan on seeing her back after her 9-week therapy/workout course.  If for some reason she has pain that still localizes to the left SI joint, we discussed we could always consider an ultrasound-guided injection for both diagnostic and therapeutic purposes, although I do not think this is the single etiology of her pain.  Follow-up: Return in about 9 weeks (around 12/13/2023) for for Low back/SI  Meds & Orders: No orders of the defined types were placed in this encounter.  No orders of the defined types were placed in this encounter.    Procedures: No procedures performed      Clinical History: No specialty comments available.  She reports that she has never smoked. She has never used smokeless tobacco.  Recent Labs    02/20/23 1014 08/23/23 1007  HGBA1C 7.2* 7.1*    Objective:    Physical Exam  Gen: Well-appearing, in no acute distress; non-toxic CV: Well-perfused. Warm.  Resp: Breathing  unlabored on room air; no wheezing. Psych: Fluid speech in conversation; appropriate affect; normal thought process  Ortho Exam - Lumbar/SI/hip: There is no midline spinous process TTP.  She has full range of motion with flexion and extension of the lumbar spine.  There is positive TTP overlying the left posterior SI joint with positive Fortin's point test and associated FABER testing.  There is some mild weakness and hesitancy with left hip flexion compared to right hip flexion.  Otherwise has 5/5 strength  in the lower extremity myotomes.  Negative SLR.  Imaging:  11/20/22: 2 view radiographs of the left hip shows arthritic changes with  subchondral cyst sclerosis and joint space narrowing.   Past Medical/Family/Surgical/Social History: Medications & Allergies reviewed per EMR, new medications updated. Patient Active Problem List   Diagnosis Date Noted   Snoring 07/10/2023   Insomnia 02/20/2023   Attention deficit hyperactivity disorder, combined type, moderate 03/01/2022   Impacted cerumen of both ears 01/23/2021   Left Achilles tendinitis 06/19/2017   Depression 07/13/2015   Preventative health care 06/23/2014   PCOS (polycystic ovarian syndrome) 09/10/2013   CHOLELITHIASIS 02/20/2008   Hyperlipidemia 08/06/2006   Uncontrolled diabetes mellitus with hyperglycemia (HCC) 07/30/2006   HYPERTENSION, BENIGN ESSENTIAL 07/30/2006   MORBID OBESITY 07/25/2006   Past Medical History:  Diagnosis Date   Anxiety    Arthritis    left shoulder   Back pain    Chronic kidney disease    stage 1   Depression    Diabetes mellitus without complication (HCC)    type 2- on meds   Diverticulitis    High cholesterol    History of chicken pox    Hyperlipidemia    on meds   Hypertension    on meds   Joint pain    Lactose intolerance    PCOS (polycystic ovarian syndrome)    Seasonal allergies    Shingles    Sleep apnea    does NOT use CPAP   SVD (spontaneous vaginal delivery) 1995   x 1   Type 2 diabetes mellitus, uncontrolled 07/30/2006   Qualifier: Diagnosis of  By: Redia Candela  MD, Roz Cornelia     Family History  Problem Relation Age of Onset   Obesity Mother    Colon polyps Mother    Cancer Mother        breast   Hyperlipidemia Mother    Heart disease Mother    Hypertension Mother    Mental illness Mother        depression   Diabetes Mother    Alcoholism Father    Hyperlipidemia Father    Stroke Father    Hypertension Father    Cancer Maternal Grandmother        colon    Diabetes Maternal Grandmother    Diabetes Maternal Aunt    Glaucoma Maternal Aunt    Colon polyps Maternal Uncle    Colon cancer Maternal Uncle    Diabetes Maternal Uncle    Diabetes Maternal Uncle    Esophageal cancer Neg Hx    Rectal cancer Neg Hx    Stomach cancer Neg Hx    Past Surgical History:  Procedure Laterality Date   ANKLE FRACTURE SURGERY  05/28/1989   right   CHOLECYSTECTOMY  05/28/2006   COLONOSCOPY  1989   due to blood in stool and diverticulitis per pt   HYSTEROSCOPY N/A 09/13/2014   Procedure: HYSTEROSCOPY with IUD removal;  Surgeon: Dyanna Glasgow, DO;  Location: [facility];  Service:  Gynecology;  Laterality: N/A;  need longest speculum and instruments that aresavailable due to patient's habitus (BMI ~60)   Social History   Occupational History   Not on file  Tobacco Use   Smoking status: Never   Smokeless tobacco: Never  Vaping Use   Vaping status: Never Used  Substance and Sexual Activity   Alcohol use: No    Alcohol/week: 0.0 standard drinks of alcohol   Drug use: No   Sexual activity: Yes    Partners: Male    Birth control/protection: Pill

## 2023-10-17 ENCOUNTER — Encounter: Payer: Self-pay | Admitting: Family

## 2023-10-17 DIAGNOSIS — F902 Attention-deficit hyperactivity disorder, combined type: Secondary | ICD-10-CM

## 2023-10-17 MED ORDER — AMPHETAMINE-DEXTROAMPHET ER 30 MG PO CP24: 30.0000 mg | ORAL_CAPSULE | ORAL | 0 refills | Status: DC

## 2023-10-17 NOTE — Telephone Encounter (Signed)
 Requesting: Adderall XR 30mg   Contract:02/20/23 UDS: 02/20/23 Last Visit: 08/23/23 Next Visit: 12/20/23 Last Refill: 09/16/23 #30 and 0RF   Please Advise

## 2023-10-22 ENCOUNTER — Other Ambulatory Visit: Payer: Self-pay | Admitting: Family

## 2023-11-01 ENCOUNTER — Encounter: Payer: Self-pay | Admitting: Sports Medicine

## 2023-11-01 ENCOUNTER — Encounter (HOSPITAL_BASED_OUTPATIENT_CLINIC_OR_DEPARTMENT_OTHER): Payer: Self-pay | Admitting: Physical Therapy

## 2023-11-12 ENCOUNTER — Encounter: Payer: Self-pay | Admitting: Internal Medicine

## 2023-11-12 MED ORDER — DEXCOM G7 SENSOR MISC: 0 refills | Status: DC

## 2023-11-14 ENCOUNTER — Encounter: Payer: Self-pay | Admitting: Family

## 2023-11-14 DIAGNOSIS — F902 Attention-deficit hyperactivity disorder, combined type: Secondary | ICD-10-CM

## 2023-11-14 MED ORDER — AMPHETAMINE-DEXTROAMPHET ER 30 MG PO CP24: 30.0000 mg | ORAL_CAPSULE | ORAL | 0 refills | Status: DC

## 2023-11-14 NOTE — Telephone Encounter (Signed)
 Requesting: Adderall XR 30mg   Contract: 02/20/23 UDS: 02/20/23 Last Visit: 08/23/23 Next Visit:  12/20/23 Last Refill: 10/17/23 #30 and 0RF   Please Advise

## 2023-11-20 ENCOUNTER — Encounter: Payer: Self-pay | Admitting: Internal Medicine

## 2023-11-20 MED ORDER — DAPAGLIFLOZIN PROPANEDIOL 10 MG PO TABS: 10.0000 mg | ORAL_TABLET | Freq: Every day | ORAL | 3 refills | Status: DC

## 2023-11-20 NOTE — Addendum Note (Signed)
 Addended by: CLEOTILDE ROLIN RAMAN on: 11/20/2023 02:51 PM   Modules accepted: Orders

## 2023-11-20 NOTE — Telephone Encounter (Signed)
 I called and spoke with Medford B at [employer] and he advised that her insurance does not cover Generic at all but they cover the brand and its going to be $250, but she can go on the [employer]  website and get the savings card and it should take her price down to possibly $0 oop.    I called and spoke with the patient to let her know all the details and she stated that she would go on the website to apply for the savings card.

## 2023-11-21 ENCOUNTER — Encounter: Payer: Self-pay | Admitting: Sports Medicine

## 2023-11-22 MED ORDER — DAPAGLIFLOZIN PROPANEDIOL 10 MG PO TABS: 10.0000 mg | ORAL_TABLET | Freq: Every day | ORAL | 0 refills | Status: DC

## 2023-11-22 NOTE — Addendum Note (Signed)
 Addended by: CLEOTILDE ROLIN RAMAN on: 11/22/2023 07:56 AM   Modules accepted: Orders

## 2023-12-04 ENCOUNTER — Encounter: Payer: Self-pay | Admitting: Sports Medicine

## 2023-12-09 ENCOUNTER — Encounter: Payer: Self-pay | Admitting: Family

## 2023-12-13 ENCOUNTER — Ambulatory Visit: Admitting: Sports Medicine

## 2023-12-13 ENCOUNTER — Encounter: Payer: Self-pay | Admitting: Sports Medicine

## 2023-12-13 ENCOUNTER — Other Ambulatory Visit: Payer: Self-pay

## 2023-12-13 DIAGNOSIS — M5442 Lumbago with sciatica, left side: Secondary | ICD-10-CM

## 2023-12-13 DIAGNOSIS — M48061 Spinal stenosis, lumbar region without neurogenic claudication: Secondary | ICD-10-CM

## 2023-12-13 DIAGNOSIS — G8929 Other chronic pain: Secondary | ICD-10-CM | POA: Diagnosis not present

## 2023-12-13 DIAGNOSIS — M7918 Myalgia, other site: Secondary | ICD-10-CM | POA: Diagnosis not present

## 2023-12-13 DIAGNOSIS — Z0279 Encounter for issue of other medical certificate: Secondary | ICD-10-CM

## 2023-12-13 DIAGNOSIS — R2681 Unsteadiness on feet: Secondary | ICD-10-CM

## 2023-12-13 DIAGNOSIS — M533 Sacrococcygeal disorders, not elsewhere classified: Secondary | ICD-10-CM

## 2023-12-13 NOTE — Telephone Encounter (Signed)
 Form started, will be completed and signed by provider

## 2023-12-13 NOTE — Progress Notes (Signed)
 Patient says that she would like to discuss SI joint injection today. She says that she is doing better mentally, although she is still having some discomfort. She has been doing her home exercises, and has been going to the gym twice a week. She did try to walk a mile, and stopped every quarter of a mile due to feeling her leg going numb. She has weaned herself off of her Gabapentin and Celebrex  and only takes these as needed.

## 2023-12-13 NOTE — Progress Notes (Signed)
 Audrey Goodman - 53 y.o. female MRN [id number]  Date of birth: 10-18-1970  Office Visit Note: Visit Date: 12/13/2023 PCP: Daryl Setter, NP Referred by: Daryl Setter, NP  Subjective: Chief Complaint  Patient presents with   Lower Back - Follow-up   HPI: Audrey Goodman is a pleasant 53 y.o. female who presents today for follow-up of of low back pain with lumbar foraminal stenosis and SI joint dysfunction.  Rebel feels she is doing fairly well at this point.  She knows she is going to have chronic back pain but feels from a mental and physical standpoint she is in a good space.  She does have continued discomfort over the left posterior hip, pointing to the SI joint region.  She has been doing an excellent job trying to get back into activity and have healthy lifestyle changes.  She is performing her home exercises a few times weekly.  She also has been going to [employer] twice weekly.  She does walk on the track on the upper level as well.  Recently she was able to walk 1 mile, she had to stop every quarter mile due to progressive numbness in the left leg but was able to complete this in about 40-45 minutes.  She has been able to wean herself off of her gabapentin which she feels good about.  She takes Celebrex  100 mg daily as needed when she has a flare of her pain.  She does continue to use her cane for support/safety net as she feels slightly unstable still on her gait although improved from previous visits.  She is doing aquatic-based exercises as well.  Pertinent ROS were reviewed with the patient and found to be negative unless otherwise specified above in HPI.   Assessment & Plan: Visit Diagnoses:  1. Chronic left-sided low back pain with left-sided sciatica   2. Foraminal stenosis of lumbar region   3. Sacroiliac joint dysfunction of both sides   4. Left buttock pain   5. Gait instability    Plan: Impression is chronic low back pain with underlying moderate to  severe lumbar foraminal stenosis.  She has been seen by previous spine physicians, and has been recommended against any surgical intervention based on body habitus and unclear status of benefit.  She does have back pain on a daily basis, but has done an excellent job getting involved in physical therapy, home exercises and working out/walking on her own.  Her symptoms are better managed and she is not requiring daily medication at this point.  As long as she is not having weakness in the legs, would not pursue surgical intervention at this time.  Did discuss with Asha that unfortunately given her stenosis and related arthritic change of the back and hip, this is something that she will need to manage going forward.  She is in the process of applying for long-term handicap parking pass, I do think this is indicated given that her conditions are chronic and will be life-long. More so recently she has been having exacerbation of her left SI joint region with gait dysfunction. Through shared decision making, we did proceed with ultrasound-guided SI joint injection, patient tolerated well.  Advised on postinjection protocol.  Following 48 hours of modified rest, she may get back into her home exercises, aquatic therapy and walking.  She may use her Celebrex  100 mg daily as needed for any postinjection pain and for any breakthrough pain going forward.   Follow-up: Return if  symptoms worsen or fail to improve.   Meds & Orders: No orders of the defined types were placed in this encounter.   Orders Placed This Encounter  Procedures   US  Guided Needle Placement - No Linked Charges     Procedures: U/S-guided SI-joint injection, Left   After discussion of risk/benefits/indications, informed verbal consent was obtained. A timeout was then performed. The patient was positioned in a prone position on exam room table with a pillow placed under the pelvis for mild hip flexion. The SI joint area was cleaned and prepped  with betadine and alcohol swabs. Sterile ultrasound gel was applied and the ultrasound transducer was placed in an anatomic axial plane over the PSIS, then moved distally over the SI-joint. Using ultrasound guidance, a 22-gauge, 3.5 needle was inserted from a medial to lateral approach utilizing an in-plane approach and directed into the SI-joint. The SI-joint was then injected with a mixture of 4:1 lidocaine :depomedrol with visualization of the injectate flow into the SI-joint under ultrasound visualization. The patient tolerated the procedure well without immediate complications.       Clinical History: No specialty comments available.  She reports that she has never smoked. She has never used smokeless tobacco.  Recent Labs    02/20/23 1014 08/23/23 1007  HGBA1C 7.2* 7.1*    Objective:    Physical Exam  Gen: Well-appearing, in no acute distress; non-toxic CV: Well-perfused. Warm.  Resp: Breathing unlabored on room air; no wheezing. Psych: Fluid speech in conversation; appropriate affect; normal thought process  Ortho Exam - Low back/SI-joint: + TTP overlying the left SI joint region just inferior to the PSIS.  There is no redness swelling or effusion here.  There is limited range of motion about the lumbar spine specifically with lumbar extension.  Patient does ambulate with a slow gait with the use of a cane more so for support.   Imaging:  MRI lumbar spine from 10/28/2022 demonstrates multilevel mild to moderate degenerative disc disease. There is an moderately prominent disc bulge at the L3-L4 level with right foraminal protrusion. There is at least moderate to severe left and right foraminal stenosis. Mild disc bulge at the L4-L5 level without neural impingement. There is some facet arthropathy of the mid to lower lumbar spine.   Past Medical/Family/Surgical/Social History: Medications & Allergies reviewed per EMR, new medications updated. Patient Active Problem List   Diagnosis  Date Noted   Snoring 07/10/2023   Insomnia 02/20/2023   Attention deficit hyperactivity disorder, combined type, moderate 03/01/2022   Impacted cerumen of both ears 01/23/2021   Left Achilles tendinitis 06/19/2017   Depression 07/13/2015   Preventative health care 06/23/2014   PCOS (polycystic ovarian syndrome) 09/10/2013   CHOLELITHIASIS 02/20/2008   Hyperlipidemia 08/06/2006   Uncontrolled diabetes mellitus with hyperglycemia (HCC) 07/30/2006   HYPERTENSION, BENIGN ESSENTIAL 07/30/2006   MORBID OBESITY 07/25/2006   Past Medical History:  Diagnosis Date   Anxiety    Arthritis    left shoulder   Back pain    Chronic kidney disease    stage 1   Depression    Diabetes mellitus without complication (HCC)    type 2- on meds   Diverticulitis    High cholesterol    History of chicken pox    Hyperlipidemia    on meds   Hypertension    on meds   Joint pain    Lactose intolerance    PCOS (polycystic ovarian syndrome)    Seasonal allergies    Shingles  Sleep apnea    does NOT use CPAP   SVD (spontaneous vaginal delivery) 1995   x 1   Type 2 diabetes mellitus, uncontrolled 07/30/2006   Qualifier: Diagnosis of  By: Lucille  MD, Preston     Family History  Problem Relation Age of Onset   Obesity Mother    Colon polyps Mother    Cancer Mother        breast   Hyperlipidemia Mother    Heart disease Mother    Hypertension Mother    Mental illness Mother        depression   Diabetes Mother    Alcoholism Father    Hyperlipidemia Father    Stroke Father    Hypertension Father    Cancer Maternal Grandmother        colon   Diabetes Maternal Grandmother    Diabetes Maternal Aunt    Glaucoma Maternal Aunt    Colon polyps Maternal Uncle    Colon cancer Maternal Uncle    Diabetes Maternal Uncle    Diabetes Maternal Uncle    Esophageal cancer Neg Hx    Rectal cancer Neg Hx    Stomach cancer Neg Hx    Past Surgical History:  Procedure Laterality Date   ANKLE  FRACTURE SURGERY  05/28/1989   right   CHOLECYSTECTOMY  05/28/2006   COLONOSCOPY  1989   due to blood in stool and diverticulitis per pt   HYSTEROSCOPY N/A 09/13/2014   Procedure: HYSTEROSCOPY with IUD removal;  Surgeon: Duwaine Blumenthal, DO;  Location: [facility];  Service: Gynecology;  Laterality: N/A;  need longest speculum and instruments that aresavailable due to patient's habitus (BMI ~60)   Social History   Occupational History   Not on file  Tobacco Use   Smoking status: Never   Smokeless tobacco: Never  Vaping Use   Vaping status: Never Used  Substance and Sexual Activity   Alcohol use: No    Alcohol/week: 0.0 standard drinks of alcohol   Drug use: No   Sexual activity: Yes    Partners: Male    Birth control/protection: Pill

## 2023-12-15 ENCOUNTER — Other Ambulatory Visit: Payer: Self-pay | Admitting: Internal Medicine

## 2023-12-16 NOTE — Telephone Encounter (Signed)
 Refill request complete

## 2023-12-20 ENCOUNTER — Ambulatory Visit: Admitting: Family

## 2023-12-20 ENCOUNTER — Other Ambulatory Visit: Payer: Self-pay

## 2023-12-20 ENCOUNTER — Other Ambulatory Visit: Payer: Self-pay | Admitting: Family

## 2023-12-20 ENCOUNTER — Ambulatory Visit: Payer: Self-pay | Admitting: Family

## 2023-12-20 VITALS — BP 135/75 | HR 97 | Temp 99.4°F | Ht 65.0 in | Wt 249.6 lb

## 2023-12-20 DIAGNOSIS — F902 Attention-deficit hyperactivity disorder, combined type: Secondary | ICD-10-CM

## 2023-12-20 DIAGNOSIS — E785 Hyperlipidemia, unspecified: Secondary | ICD-10-CM | POA: Diagnosis not present

## 2023-12-20 DIAGNOSIS — E1165 Type 2 diabetes mellitus with hyperglycemia: Secondary | ICD-10-CM

## 2023-12-20 DIAGNOSIS — I10 Essential (primary) hypertension: Secondary | ICD-10-CM

## 2023-12-20 DIAGNOSIS — G4700 Insomnia, unspecified: Secondary | ICD-10-CM

## 2023-12-20 DIAGNOSIS — F32A Depression, unspecified: Secondary | ICD-10-CM

## 2023-12-20 DIAGNOSIS — Z7984 Long term (current) use of oral hypoglycemic drugs: Secondary | ICD-10-CM

## 2023-12-20 LAB — LIPID PANEL
Cholesterol: 171 mg/dL (ref 0–200)
HDL: 62.8 mg/dL (ref >39.00)
LDL Cholesterol: 90 mg/dL (ref 0–99)
NonHDL: 108.32
Total CHOL/HDL Ratio: 3
Triglycerides: 94 mg/dL (ref 0.0–149.0)
VLDL: 18.8 mg/dL (ref 0.0–40.0)

## 2023-12-20 LAB — BASIC METABOLIC PANEL WITH GFR
BUN: 20 mg/dL (ref 6–23)
CO2: 27 meq/L (ref 19–32)
Calcium: 10.2 mg/dL (ref 8.4–10.5)
Chloride: 100 meq/L (ref 96–112)
Creatinine, Ser: 0.95 mg/dL (ref 0.40–1.20)
GFR: 69.09 mL/min (ref >60.00)
Glucose, Bld: 136 mg/dL — ABNORMAL HIGH (ref 70–99)
Potassium: 4.9 meq/L (ref 3.5–5.1)
Sodium: 137 meq/L (ref 135–145)

## 2023-12-20 MED ORDER — FLUOXETINE HCL 10 MG PO CAPS: 30.0000 mg | ORAL_CAPSULE | Freq: Every day | ORAL | 1 refills | Status: DC

## 2023-12-20 NOTE — Assessment & Plan Note (Signed)
 Lab Results  Component Value Date   HGBA1C 7.1 (H) 08/23/2023   HGBA1C 7.2 (H) 02/20/2023   HGBA1C 6.3 (A) 09/28/2021   Lab Results  Component Value Date   MICROALBUR 1.1 03/21/2020   LDLCALC 109 (H) 08/23/2023   CREATININE 0.86 08/23/2023   Last A1C was nearly at goal. Maintained on farxiga , metformin , lantus  and ozempic . Continue same.

## 2023-12-20 NOTE — Patient Instructions (Signed)
 VISIT SUMMARY:  Today, we reviewed your medications and blood sugar management. We discussed your diabetes, blood pressure, cholesterol, PCOS, ADHD, and depression. We also talked about your weight management and general health maintenance.  YOUR PLAN:  TYPE 2 DIABETES MELLITUS: Your blood sugar levels may have been affected by a recent steroid injection, and you have been noncompliant with monitoring due to stress and aversion to finger sticks. -Continue taking semaglutide  2.4 mg weekly. -We will coordinate with a pharmacist to assist you with a glucose sensor. -We discussed the importance of compliance and stress management.  HYPERTENSION: Your blood pressure is well controlled with your current medications. -Continue taking amlodipine  and aldactone  as prescribed.  HYPERLIPIDEMIA: Your cholesterol levels are slightly above the target despite the increase in Crestor . -We will order a lipid panel to check your cholesterol levels. -Continue taking Crestor  20 mg daily.  POLYCYSTIC OVARY SYNDROME (PCOS): Aldactone  helps manage your PCOS and blood pressure. -Continue taking aldactone  as prescribed.  ATTENTION DEFICIT HYPERACTIVITY DISORDER (ADHD): You are experiencing mixed focus, possibly due to stress. -We will update your medication contract for Adderall. -Continue taking Adderall 30 mg as prescribed.  DEPRESSION: You are experiencing increased stress and mood issues, and we discussed financial constraints affecting your medication dose. -We will prescribe fluoxetine  10 mg capsules, to be taken as 3 capsules daily for a total of 30 mg.  GENERAL HEALTH MAINTENANCE: Your weight is decreasing steadily, and you are due for a Pap smear. -Schedule a Pap smear with your gynecologist. -Continue with your current weight management strategies.  FOLLOW-UP: We need to monitor your progress and coordinate assistance for medication costs. -Schedule a follow-up appointment in three months for a  physical and labs. -We will coordinate with a pharmacist to assist with medication costs.

## 2023-12-20 NOTE — Assessment & Plan Note (Addendum)
 Fair control on adderall xr. Continue same. Updated Contract today.

## 2023-12-20 NOTE — Telephone Encounter (Signed)
 Requesting: adderall Contract:03/19/23 UDS:12/20/23 & 9//25/24 Last Visit:12/20/23 Next Visit:03/24/24 Last Refill:11/14/23  Please Advise           Copied from CRM #[id number]. Topic: Clinical - Medication Refill >> Dec 20, 2023 10:54 AM Macario HERO wrote: Medication: amphetamine -dextroamphetamine  (ADDERALL XR) 30 MG 24 hr capsule [[id number]]  Has the patient contacted their pharmacy? No (Agent: If no, request that the patient contact the pharmacy for the refill. If patient does not wish to contact the pharmacy document the reason why and proceed with request.) (Agent: If yes, when and what did the pharmacy advise?)  This is the patient's preferred pharmacy:  [id number] [place],  - [address] [place] [address] Phone: [phone] Fax: [phone]   Is this the correct pharmacy for this prescription? Yes If no, delete pharmacy and type the correct one.   Has the prescription been filled recently? Yes  Is the patient out of the medication? Yes  Has the patient been seen for an appointment in the last year OR does the patient have an upcoming appointment? Yes  Can we respond through MyChart? Yes  Agent: Please be advised that Rx refills may take up to 3 business days. We ask that you follow-up with your pharmacy. >> Dec 20, 2023  3:33 PM Rosina D wrote: Patient called stating she is checking on her medication. She has enough to last until sunday

## 2023-12-20 NOTE — Assessment & Plan Note (Signed)
 Lab Results  Component Value Date   CHOL 188 08/23/2023   HDL 56.90 08/23/2023   LDLCALC 109 (H) 08/23/2023   TRIG 111.0 08/23/2023   CHOLHDL 3 08/23/2023   Last visit we increased crestor . Update panel.

## 2023-12-20 NOTE — Assessment & Plan Note (Signed)
 BP stable. She continues aldactone /amlodipine .

## 2023-12-20 NOTE — Telephone Encounter (Unsigned)
 Copied from CRM [id number]. Topic: Clinical - Medication Refill >> Dec 20, 2023 10:54 AM Macario HERO wrote: Medication: amphetamine -dextroamphetamine  (ADDERALL XR) 30 MG 24 hr capsule [[id number]]  Has the patient contacted their pharmacy? No (Agent: If no, request that the patient contact the pharmacy for the refill. If patient does not wish to contact the pharmacy document the reason why and proceed with request.) (Agent: If yes, when and what did the pharmacy advise?)  This is the patient's preferred pharmacy:  [id number] [place], [place] - [address] [place] [address] Phone: [phone] Fax: [phone]   Is this the correct pharmacy for this prescription? Yes If no, delete pharmacy and type the correct one.   Has the prescription been filled recently? Yes  Is the patient out of the medication? Yes  Has the patient been seen for an appointment in the last year OR does the patient have an upcoming appointment? Yes  Can we respond through MyChart? Yes  Agent: Please be advised that Rx refills may take up to 3 business days. We ask that you follow-up with your pharmacy.

## 2023-12-20 NOTE — Progress Notes (Signed)
 Subjective:     Patient ID: Audrey Goodman, female    DOB: 1971/02/13, 53 y.o.   MRN: [id number]  No chief complaint on file.   HPI  Discussed the use of AI scribe software for clinical note transcription with the patient, who gave verbal consent to proceed.  History of Present Illness  Audrey Goodman is a 53 year old female with ADHD and type 2 diabetes who presents for a follow-up on medications and blood sugar management.  She takes Adderall 30 mg for ADHD, with focus being 'okay', though stress may affect concentration. For diabetes and weight management, she is on semaglutide  2.4 mg weekly and has lost 13 pounds in three months. She strength trains twice a week but feels the medication's effectiveness may be waning.  She takes Aldactone  twice daily (for BP and PCOS) and amlodipine  for blood pressure. Crestor  20 mg is used for cholesterol management, increased from 10 mg. She is unsure if her kidney specialist has sent a cholesterol report.  Fluoxetine  20 mg is used for mood management, reduced from 30 mg due to financial constraints. She experiences sleep disturbances, feeling tired despite sleeping, and snores. A home sleep study showed no significant sleep apnea.  Financial stress affects her ability to maintain medication doses. She works from home, which she finds beneficial, and is considering moving to [place]  next year for financial reasons.     Health Maintenance Due  Topic Date Due   COVID-19 Vaccine (8 - 2024-25 season) 03/31/2023   Cervical Cancer Screening (HPV/Pap Cotest)  12/02/2023    Past Medical History:  Diagnosis Date   Anxiety    Arthritis    left shoulder   Back pain    Chronic kidney disease    stage 1   Depression    Diabetes mellitus without complication (HCC)    type 2- on meds   Diverticulitis    High cholesterol    History of chicken pox    Hyperlipidemia    on meds   Hypertension    on meds   Joint pain    Lactose intolerance     PCOS (polycystic ovarian syndrome)    Seasonal allergies    Shingles    Sleep apnea    does NOT use CPAP   SVD (spontaneous vaginal delivery) 1995   x 1   Type 2 diabetes mellitus, uncontrolled 07/30/2006   Qualifier: Diagnosis of  By: Lucille  MD, [place]      Past Surgical History:  Procedure Laterality Date   ANKLE FRACTURE SURGERY  05/28/1989   right   CHOLECYSTECTOMY  05/28/2006   COLONOSCOPY  1989   due to blood in stool and diverticulitis per pt   HYSTEROSCOPY N/A 09/13/2014   Procedure: HYSTEROSCOPY with IUD removal;  Surgeon: Duwaine Blumenthal, DO;  Location: [facility];  Service: Gynecology;  Laterality: N/A;  need longest speculum and instruments that aresavailable due to patient's habitus (BMI ~60)    Family History  Problem Relation Age of Onset   Obesity Mother    Colon polyps Mother    Cancer Mother        breast   Hyperlipidemia Mother    Heart disease Mother    Hypertension Mother    Mental illness Mother        depression   Diabetes Mother    Alcoholism Father    Hyperlipidemia Father    Stroke Father    Hypertension Father    Cancer Maternal Grandmother  colon   Diabetes Maternal Grandmother    Diabetes Maternal Aunt    Glaucoma Maternal Aunt    Colon polyps Maternal Uncle    Colon cancer Maternal Uncle    Diabetes Maternal Uncle    Diabetes Maternal Uncle    Esophageal cancer Neg Hx    Rectal cancer Neg Hx    Stomach cancer Neg Hx     Social History   Socioeconomic History   Marital status: Single    Spouse name: Not on file   Number of children: Not on file   Years of education: Not on file   Highest education level: Bachelor's degree (e.g., BA, AB, BS)  Occupational History   Not on file  Tobacco Use   Smoking status: Never   Smokeless tobacco: Never  Vaping Use   Vaping status: Never Used  Substance and Sexual Activity   Alcohol use: No    Alcohol/week: 0.0 standard drinks of alcohol   Drug use: No   Sexual activity: Yes     Partners: Male    Birth control/protection: Pill  Other Topics Concern   Not on file  Social History Narrative   Single- lives alone   Son 21- senior at [employer]   Enjoys reading   Works as a Advertising account planner at [employer] Strain: Medium Risk (12/13/2023)   Overall Financial Resource Strain (CARDIA)    Difficulty of Paying Living Expenses: Somewhat hard  Food Insecurity: No Food Insecurity (12/13/2023)   Hunger Vital Sign    Worried About Running Out of Food in the Last Year: Never true    Ran Out of Food in the Last Year: Never true  Transportation Needs: No Transportation Needs (12/13/2023)   PRAPARE - Administrator, Civil Service (Medical): No    Lack of Transportation (Non-Medical): No  Physical Activity: Insufficiently Active (12/13/2023)   Exercise Vital Sign    Days of Exercise per Week: 2 days    Minutes of Exercise per Session: 30 min  Stress: No Stress Concern Present (12/13/2023)   [employer] of Occupational Health - Occupational Stress Questionnaire    Feeling of Stress: Only a little  Social Connections: Socially Isolated (12/13/2023)   Social Connection and Isolation Panel    Frequency of Communication with Friends and Family: Three times a week    Frequency of Social Gatherings with Friends and Family: Once a week    Attends Religious Services: Never    Database administrator or Organizations: No    Attends Engineer, structural: Not on file    Marital Status: Never married  Intimate Partner Violence: Not on file    Outpatient Medications Prior to Visit  Medication Sig Dispense Refill   amLODipine  (NORVASC ) 5 MG tablet TAKE 1 TABLET DAILY 90 tablet 0   amphetamine -dextroamphetamine  (ADDERALL XR) 30 MG 24 hr capsule Take 1 capsule (30 mg total) by mouth every morning. 30 capsule 0   cetirizine (ZYRTEC) 10 MG tablet Take 10 mg by mouth daily.     Continuous Glucose Sensor (DEXCOM G7 SENSOR)  MISC USE TO CHECK GLUCOSE CONTINUOUSLY, CHANGE EVERY 10 DAYS 3 each 0   dapagliflozin  propanediol (FARXIGA ) 10 MG TABS tablet Take 1 tablet (10 mg total) by mouth daily. Fill as generic. 30 tablet 0   fluticasone  (FLONASE ) 50 MCG/ACT nasal spray Place 2 sprays into both nostrils daily. 48 g 1   insulin  aspart (NOVOLOG  FLEXPEN)  100 UNIT/ML FlexPen Inject 12-15 Units into the skin 3 (three) times daily with meals. INJECT 12-15 UNITS (DEPENDING ON MEAL SIZE) UNDER THE SKIN 3 TIMES A DAYINJECT 12-15 UNITS (DEPENDING ON MEAL SIZE) UNDER THE SKIN 3 TIMES A DAY 45 mL 3   insulin  glargine (LANTUS  SOLOSTAR) 100 UNIT/ML Solostar Pen Inject 10 Units into the skin daily.     Insulin  Pen Needle 32G X 4 MM MISC Use to inject insulin  4 times daily 400 each 3   metFORMIN  (GLUCOPHAGE -XR) 750 MG 24 hr tablet TAKE 1 TABLET BY MOUTH 2 TIMES DAILY. 180 tablet 3   Multiple Vitamin (MULTIVITAMIN PO) Take 1 tablet by mouth daily at 6 (six) AM.     rosuvastatin  (CRESTOR ) 20 MG tablet Take 1 tablet (20 mg total) by mouth daily. 90 tablet 1   Semaglutide -Weight Management 2.4 MG/0.75ML SOAJ Inject 2.4 mg into the skin once a week. 9 mL 3   spironolactone  (ALDACTONE ) 50 MG tablet Take 1 tablet (50 mg total) by mouth 2 (two) times daily. 180 tablet 3   celecoxib  (CELEBREX ) 100 MG capsule TAKE 1 CAPSULE BY MOUTH TWICE A DAY 60 capsule 0   FLUoxetine  (PROZAC ) 20 MG capsule Take 1 capsule (20 mg total) by mouth daily. 90 capsule 1   Fluoxetine  HCl, PMDD, 10 MG TABS Take by mouth.     gabapentin (NEURONTIN) 300 MG capsule Take by mouth.     Phentermine HCl (LOMAIRA) 8 MG TABS Take by mouth. (Patient not taking: Reported on 09/12/2023)     traMADol (ULTRAM) 50 MG tablet SMARTSIG:1 Tablet(s) By Mouth Every 12 Hours (Patient not taking: Reported on 09/12/2023)     No facility-administered medications prior to visit.    Allergies  Allergen Reactions   Lisinopril  Swelling    Swelling of the left side of tongue   Lipitor  [Atorvastatin ] Other (See Comments)    Myalgia/foot cramping    ROS    See HPI Objective:    Physical Exam Constitutional:      General: She is not in acute distress.    Appearance: Normal appearance. She is well-developed.  HENT:     Head: Normocephalic and atraumatic.     Right Ear: External ear normal.     Left Ear: External ear normal.  Eyes:     General: No scleral icterus. Neck:     Thyroid : No thyromegaly.  Cardiovascular:     Rate and Rhythm: Normal rate and regular rhythm.     Heart sounds: Normal heart sounds. No murmur heard. Pulmonary:     Effort: Pulmonary effort is normal. No respiratory distress.     Breath sounds: Normal breath sounds. No wheezing.  Musculoskeletal:     Cervical back: Neck supple.  Skin:    General: Skin is warm and dry.  Neurological:     Mental Status: She is alert and oriented to person, place, and time.  Psychiatric:        Attention and Perception: Attention normal.        Mood and Affect: Mood is depressed. Affect is flat.        Behavior: Behavior normal.        Thought Content: Thought content normal.        Judgment: Judgment normal.      BP 135/75 (BP Location: Left Arm, Patient Position: Sitting, Cuff Size: Large)   Pulse 97   Temp 99.4 F (37.4 C) (Oral)   Ht 5' 5 (1.651 m)   Wt  249 lb 9.6 oz (113.2 kg)   SpO2 100%   BMI 41.54 kg/m  Wt Readings from Last 3 Encounters:  12/20/23 249 lb 9.6 oz (113.2 kg)  09/12/23 262 lb (118.8 kg)  08/23/23 260 lb (117.9 kg)       Assessment & Plan:   Problem List Items Addressed This Visit       Unprioritized   Uncontrolled diabetes mellitus with hyperglycemia (HCC) - Primary   Lab Results  Component Value Date   HGBA1C 7.1 (H) 08/23/2023   HGBA1C 7.2 (H) 02/20/2023   HGBA1C 6.3 (A) 09/28/2021   Lab Results  Component Value Date   MICROALBUR 1.1 03/21/2020   LDLCALC 109 (H) 08/23/2023   CREATININE 0.86 08/23/2023   Last A1C was nearly at goal. Maintained on  farxiga , metformin , lantus  and ozempic . Continue same.       Relevant Orders   AMB Referral [facility] Care Management   Basic Metabolic Panel (BMET)   Insomnia   Sleeping well but still tired during the day- attributes to stress.        HYPERTENSION, BENIGN ESSENTIAL   BP stable. She continues aldactone /amlodipine .       Hyperlipidemia   Lab Results  Component Value Date   CHOL 188 08/23/2023   HDL 56.90 08/23/2023   LDLCALC 109 (H) 08/23/2023   TRIG 111.0 08/23/2023   CHOLHDL 3 08/23/2023   Last visit we increased crestor . Update panel.      Relevant Orders   Lipid panel   Depression   She was feeling better and dropped the prozac  back from 30mg  to 20mg  but now she is feeling worse. Interested in going back up on dose.       Relevant Medications   FLUoxetine  (PROZAC ) 10 MG capsule   Attention deficit hyperactivity disorder, combined type, moderate   Fair control on adderall xr. Continue same. Updated Contract today.       Relevant Orders   DRUG MONITORING, PANEL 8 WITH CONFIRMATION, URINE    I have discontinued Audrey Goodman's FLUoxetine , traMADol, Fluoxetine  HCl (PMDD), gabapentin, Lomaira, and celecoxib . I am also having her start on FLUoxetine . Additionally, I am having her maintain her Multiple Vitamin (MULTIVITAMIN PO), cetirizine, metFORMIN , fluticasone , Insulin  Pen Needle, NovoLOG  FlexPen, Semaglutide -Weight Management, spironolactone , Lantus  SoloStar, rosuvastatin , amLODipine , amphetamine -dextroamphetamine , dapagliflozin  propanediol, and Dexcom G7 Sensor.  Meds ordered this encounter  Medications   FLUoxetine  (PROZAC ) 10 MG capsule    Sig: Take 3 capsules (30 mg total) by mouth daily.    Dispense:  270 capsule    Refill:  1    Supervising Provider:   DOMENICA BLACKBIRD A [[id number]]

## 2023-12-20 NOTE — Assessment & Plan Note (Signed)
 She was feeling better and dropped the prozac  back from 30mg  to 20mg  but now she is feeling worse. Interested in going back up on dose.

## 2023-12-20 NOTE — Assessment & Plan Note (Signed)
 Sleeping well but still tired during the day- attributes to stress.

## 2023-12-22 LAB — DRUG MONITORING, PANEL 8 WITH CONFIRMATION, URINE
6 Acetylmorphine: NEGATIVE ng/mL (ref <10)
Alcohol Metabolites: NEGATIVE ng/mL (ref <500)
Amphetamine: 5809 ng/mL — ABNORMAL HIGH (ref <250)
Amphetamines: POSITIVE ng/mL — AB (ref <500)
Benzodiazepines: NEGATIVE ng/mL (ref <100)
Buprenorphine, Urine: NEGATIVE ng/mL (ref <5)
Cocaine Metabolite: NEGATIVE ng/mL (ref <150)
Creatinine: 82.2 mg/dL (ref >=20.0)
MDMA: NEGATIVE ng/mL (ref <500)
Marijuana Metabolite: NEGATIVE ng/mL (ref <20)
Methamphetamine: NEGATIVE ng/mL (ref <250)
Opiates: NEGATIVE ng/mL (ref <100)
Oxidant: NEGATIVE ug/mL (ref <200)
Oxycodone: NEGATIVE ng/mL (ref <100)
pH: 6.4 (ref 4.5–9.0)

## 2023-12-22 LAB — DM TEMPLATE

## 2023-12-23 MED ORDER — AMPHETAMINE-DEXTROAMPHET ER 30 MG PO CP24
30.0000 mg | ORAL_CAPSULE | ORAL | 0 refills | Status: DC
Start: 2023-12-23 — End: 2024-01-28

## 2024-01-01 ENCOUNTER — Encounter: Payer: Self-pay | Admitting: Sports Medicine

## 2024-01-02 ENCOUNTER — Other Ambulatory Visit: Payer: Self-pay | Admitting: Sports Medicine

## 2024-01-02 MED ORDER — CELECOXIB 100 MG PO CAPS: 100.0000 mg | ORAL_CAPSULE | Freq: Two times a day (BID) | ORAL | 1 refills | Status: DC

## 2024-01-06 ENCOUNTER — Ambulatory Visit: Admitting: Internal Medicine

## 2024-01-06 ENCOUNTER — Encounter: Payer: Self-pay | Admitting: Internal Medicine

## 2024-01-06 VITALS — BP 118/64 | HR 96 | Ht 65.0 in | Wt 251.2 lb

## 2024-01-06 DIAGNOSIS — E282 Polycystic ovarian syndrome: Secondary | ICD-10-CM

## 2024-01-06 DIAGNOSIS — Z7985 Long-term (current) use of injectable non-insulin antidiabetic drugs: Secondary | ICD-10-CM

## 2024-01-06 DIAGNOSIS — Z794 Long term (current) use of insulin: Secondary | ICD-10-CM

## 2024-01-06 DIAGNOSIS — Z7984 Long term (current) use of oral hypoglycemic drugs: Secondary | ICD-10-CM

## 2024-01-06 DIAGNOSIS — E785 Hyperlipidemia, unspecified: Secondary | ICD-10-CM | POA: Diagnosis not present

## 2024-01-06 DIAGNOSIS — E1165 Type 2 diabetes mellitus with hyperglycemia: Secondary | ICD-10-CM

## 2024-01-06 LAB — POCT GLYCOSYLATED HEMOGLOBIN (HGB A1C): Hemoglobin A1C: 7.4 % — AB (ref 4.0–5.6)

## 2024-01-06 MED ORDER — DAPAGLIFLOZIN PROPANEDIOL 10 MG PO TABS: 10.0000 mg | ORAL_TABLET | Freq: Every day | ORAL | 11 refills | Status: DC

## 2024-01-06 MED ORDER — LANTUS SOLOSTAR 100 UNIT/ML ~~LOC~~ SOPN: 30.0000 [IU] | PEN_INJECTOR | Freq: Every day | SUBCUTANEOUS | Status: DC

## 2024-01-06 MED ORDER — TIRZEPATIDE 5 MG/0.5ML ~~LOC~~ SOAJ
5.0000 mg | SUBCUTANEOUS | 2 refills | Status: DC
Start: 2024-01-06 — End: 2024-01-28

## 2024-01-06 MED ORDER — METFORMIN HCL ER 750 MG PO TB24: 750.0000 mg | ORAL_TABLET | Freq: Two times a day (BID) | ORAL | Status: DC

## 2024-01-06 NOTE — Patient Instructions (Addendum)
 Please continue: - Metformin  ER 750 mg 2x a day - Farxiga  10 mg in am, before b'fast  Try to change from Wegovy  to: - Mounjaro  5 mg weekly - let me know if we can increase the dose.  Increase: - Lantus  30 units daily in am - Novolog  10-15 units 15 min before meals  Please return in 4 months.

## 2024-01-06 NOTE — Progress Notes (Signed)
 [name] ID: Audrey Goodman, female   DOB: [birth year], 53 y.o.   MRN: [id number]   HPI: Audrey Goodman is a 53 y.o.-year-old female, returning for f/u for DM2, dx with GDM in 1995, then DM 6 mo after son was born, insulin -dependent since ~2010, controlled, without long-term complications and PCOS. Last visit 4.5 months ago.  Interim history: No increased urination, blurry vision, nausea, chest pain.  She continues to have steroids p.o. and injections for sciatica and also for hip pain.  Last injection 1 mo ago.  Sugars increased and they did not improve much since then. She lost 10 lbs since last OV.  Reviewed HbA1c levels: Lab Results  Component Value Date   HGBA1C 7.1 (H) 08/23/2023   HGBA1C 7.2 (H) 02/20/2023   HGBA1C 6.3 (A) 09/28/2021  06/07/2022: HbA1c 7.2% 05/31/2021: HbA1c 7.0%  She is on: - Metformin  ER 750 mg 2x a day - Farxiga  10 mg in am, before b'fast - Lantus  20 units at bedtime >> split 10 units 2x day >> 10 units in am and 6 units at night - Novolog  8-15 >> 10-20 >> 20-40 >> 15-25 units before meals.  - Trulicity  4.5 mg weekly >> Wegovy  2.4 mg weekly Of note, she could not tolerate Jardiance  10 (started 01/2018) because of increased urination.  Pt checks her sugars >4x a day:  Previously:   Lowest sugar was  54 >> 84 >> 50s; she has hypoglycemia awareness in the 80s. Highest sugar was 501 (streroid) >> .SABRA.288 >> 201 >> 200s.  + CKD with proteinuria-sees nephrology (Dr. Gearline), last BUN/creatinine:  Lab Results  Component Value Date   BUN 20 12/20/2023   CREATININE 0.95 12/20/2023   Lab Results  Component Value Date   MICRALBCREAT n/c 09/12/2023   MICRALBCREAT 10 03/20/2023   MICRALBCREAT 7 06/08/2022   MICRALBCREAT 11 03/21/2020  She is off lisinopril .  + HL; last set of lipids: Lab Results  Component Value Date   CHOL 171 12/20/2023   HDL 62.80 12/20/2023   LDLCALC 90 12/20/2023   TRIG 94.0 12/20/2023   CHOLHDL 3 12/20/2023  She had leg  cramps with Crestor  10. Started drinking more water >> helped.  - last eye exam was: 01/21/2023-no DR; Dr. Octavia.  - no numbness and tingling in her feet.  Last foot exam 09/12/2023. On Gabapentin.  PCOS: -She had irregular menses since menarche -+1 pregnancies, no miscarriages - was on Mirena IUD - for ~4.5 years >> then on Sprintec since 2016 >> then Orthocycen >> now Nexplanon since 2020 - She continues on metformin  ER 750 mg twice a day and spironolactone  50 mg twice a day.  She feels that this helps with her hirsutism.  Latest potassium level is normal: Lab Results  Component Value Date   K 4.9 12/20/2023   She was previously seen with weight management clinic at [facility] - nonsurgical weight loss program.  She lost 20 pounds on their diet but could not be continue due to price.   ROS: + see HPI  I reviewed pt's medications, allergies, PMH, social hx, family hx, and changes were documented in the history of present illness. Otherwise, unchanged from my initial visit note.  Past Medical History:  Diagnosis Date   Anxiety    Arthritis    left shoulder   Back pain    Chronic kidney disease    stage 1   Depression    Diabetes mellitus without complication (HCC)    type 2- on  meds   Diverticulitis    High cholesterol    History of chicken pox    Hyperlipidemia    on meds   Hypertension    on meds   Joint pain    Lactose intolerance    PCOS (polycystic ovarian syndrome)    Seasonal allergies    Shingles    Sleep apnea    does NOT use CPAP   SVD (spontaneous vaginal delivery) 1995   x 1   Type 2 diabetes mellitus, uncontrolled 07/30/2006   Qualifier: Diagnosis of  By: Lucille  MD, Preston     Past Surgical History:  Procedure Laterality Date   ANKLE FRACTURE SURGERY  05/28/1989   right   CHOLECYSTECTOMY  05/28/2006   COLONOSCOPY  1989   due to blood in stool and diverticulitis per pt   HYSTEROSCOPY N/A 09/13/2014   Procedure: HYSTEROSCOPY with IUD  removal;  Surgeon: Duwaine Blumenthal, DO;  Location: [facility];  Service: Gynecology;  Laterality: N/A;  need longest speculum and instruments that aresavailable due to patient's habitus (BMI ~60)   Social History   Socioeconomic History   Marital status: Single    Spouse name: Not on file   Number of children: Not on file   Years of education: Not on file   Highest education level: Bachelor's degree (e.g., BA, AB, BS)  Occupational History   Not on file  Tobacco Use   Smoking status: Never   Smokeless tobacco: Never  Vaping Use   Vaping status: Never Used  Substance and Sexual Activity   Alcohol use: No    Alcohol/week: 0.0 standard drinks of alcohol   Drug use: No   Sexual activity: Yes    Partners: Male    Birth control/protection: Pill  Other Topics Concern   Not on file  Social History Narrative   Single- lives alone   Son 21- senior at [employer]   Enjoys reading   Works as a Advertising account planner at [employer] Strain: Medium Risk (12/13/2023)   Overall Financial Resource Strain (CARDIA)    Difficulty of Paying Living Expenses: Somewhat hard  Food Insecurity: No Food Insecurity (12/13/2023)   Hunger Vital Sign    Worried About Running Out of Food in the Last Year: Never true    Ran Out of Food in the Last Year: Never true  Transportation Needs: No Transportation Needs (12/13/2023)   PRAPARE - Administrator, Civil Service (Medical): No    Lack of Transportation (Non-Medical): No  Physical Activity: Insufficiently Active (12/13/2023)   Exercise Vital Sign    Days of Exercise per Week: 2 days    Minutes of Exercise per Session: 30 min  Stress: No Stress Concern Present (12/13/2023)   [employer] of Occupational Health - Occupational Stress Questionnaire    Feeling of Stress: Only a little  Social Connections: Socially Isolated (12/13/2023)   Social Connection and Isolation Panel    Frequency of Communication with  Friends and Family: Three times a week    Frequency of Social Gatherings with Friends and Family: Once a week    Attends Religious Services: Never    Database administrator or Organizations: No    Attends Engineer, structural: Not on file    Marital Status: Never married  Intimate Partner Violence: Not on file   Current Outpatient Medications  Medication Sig Dispense Refill   celecoxib  (CELEBREX ) 100 MG capsule Take  1 capsule (100 mg total) by mouth 2 (two) times daily. 60 capsule 1   amLODipine  (NORVASC ) 5 MG tablet TAKE 1 TABLET DAILY 90 tablet 0   amphetamine -dextroamphetamine  (ADDERALL XR) 30 MG 24 hr capsule Take 1 capsule (30 mg total) by mouth every morning. 30 capsule 0   cetirizine (ZYRTEC) 10 MG tablet Take 10 mg by mouth daily.     Continuous Glucose Sensor (DEXCOM G7 SENSOR) MISC USE TO CHECK GLUCOSE CONTINUOUSLY, CHANGE EVERY 10 DAYS 3 each 0   dapagliflozin  propanediol (FARXIGA ) 10 MG TABS tablet Take 1 tablet (10 mg total) by mouth daily. Fill as generic. 30 tablet 0   FLUoxetine  (PROZAC ) 10 MG capsule Take 3 capsules (30 mg total) by mouth daily. 270 capsule 1   fluticasone  (FLONASE ) 50 MCG/ACT nasal spray Place 2 sprays into both nostrils daily. 48 g 1   insulin  aspart (NOVOLOG  FLEXPEN) 100 UNIT/ML FlexPen Inject 12-15 Units into the skin 3 (three) times daily with meals. INJECT 12-15 UNITS (DEPENDING ON MEAL SIZE) UNDER THE SKIN 3 TIMES A DAYINJECT 12-15 UNITS (DEPENDING ON MEAL SIZE) UNDER THE SKIN 3 TIMES A DAY 45 mL 3   insulin  glargine (LANTUS  SOLOSTAR) 100 UNIT/ML Solostar Pen Inject 10 Units into the skin daily.     Insulin  Pen Needle 32G X 4 MM MISC Use to inject insulin  4 times daily 400 each 3   metFORMIN  (GLUCOPHAGE -XR) 750 MG 24 hr tablet TAKE 1 TABLET BY MOUTH 2 TIMES DAILY. 180 tablet 3   Multiple Vitamin (MULTIVITAMIN PO) Take 1 tablet by mouth daily at 6 (six) AM.     rosuvastatin  (CRESTOR ) 20 MG tablet Take 1 tablet (20 mg total) by mouth daily. 90  tablet 1   Semaglutide -Weight Management 2.4 MG/0.75ML SOAJ Inject 2.4 mg into the skin once a week. 9 mL 3   spironolactone  (ALDACTONE ) 50 MG tablet Take 1 tablet (50 mg total) by mouth 2 (two) times daily. 180 tablet 3   No current facility-administered medications for this visit.    Allergies  Allergen Reactions   Lisinopril  Swelling    Swelling of the left side of tongue   Lipitor [Atorvastatin ] Other (See Comments)    Myalgia/foot cramping   Family History  Problem Relation Age of Onset   Obesity Mother    Colon polyps Mother    Cancer Mother        breast   Hyperlipidemia Mother    Heart disease Mother    Hypertension Mother    Mental illness Mother        depression   Diabetes Mother    Alcoholism Father    Hyperlipidemia Father    Stroke Father    Hypertension Father    Cancer Maternal Grandmother        colon   Diabetes Maternal Grandmother    Diabetes Maternal Aunt    Glaucoma Maternal Aunt    Colon polyps Maternal Uncle    Colon cancer Maternal Uncle    Diabetes Maternal Uncle    Diabetes Maternal Uncle    Esophageal cancer Neg Hx    Rectal cancer Neg Hx    Stomach cancer Neg Hx    PE: BP 118/64   Pulse 96   Ht 5' 5 (1.651 m)   Wt 251 lb 3.2 oz (113.9 kg)   SpO2 97%   BMI 41.80 kg/m  Wt Readings from Last 15 Encounters:  01/06/24 251 lb 3.2 oz (113.9 kg)  12/20/23 249 lb 9.6 oz (113.2  kg)  09/12/23 262 lb (118.8 kg)  08/23/23 260 lb (117.9 kg)  07/09/23 262 lb (118.8 kg)  03/18/23 254 lb 9.6 oz (115.5 kg)  02/20/23 259 lb (117.5 kg)  07/16/22 255 lb (115.7 kg)  06/15/22 265 lb (120.2 kg)  04/13/22 266 lb (120.7 kg)  03/16/22 277 lb (125.6 kg)  09/28/21 270 lb 9.6 oz (122.7 kg)  05/31/21 275 lb (124.7 kg)  01/23/21 281 lb (127.5 kg)  12/21/20 281 lb (127.5 kg)   Constitutional: overweight, in NAD Eyes:  EOMI, no exophthalmos ENT: no neck masses, no cervical lymphadenopathy Cardiovascular: RRR (no tachycardia at the time of the physical  exam), No MRG Respiratory: CTA B Musculoskeletal: no deformities Skin:no rashes Neurological: no tremor with outstretched hands  ASSESSMENT: 1. DM2, insulin -dependent, controlled, without long-term complications, but with hyperglycemia  2. PCOS Component     Latest Ref Rng 10/16/2013  Testosterone      10 - 70 ng/dL 54  Sex Hormone Binding     18 - 114 nmol/L 12 (L)  Testosterone  Free     0.6 - 6.8 pg/mL 15.5 (H)  Testosterone -% Free     0.4 - 2.4 % 2.9 (H)  TSH     0.35 - 4.50 uIU/mL 0.54  17-OH-Progesterone, LC/MS/MS      <8  Free T4     0.60 - 1.60 ng/dL 9.15  T3, Free     2.3 - 4.2 pg/mL 3.0   Labs confirmed PCOS (high testosterone , low 17-HO Prog, normal TFTs).  LH and FSH were not tested as she was on the Mirena IUD.  3. HL  PLAN:  1. DM2 - Patient with longstanding, uncontrolled type 2 diabetes, on metformin , SGLT2 inhibitor, weekly GLP-1 receptor agonist and basal-bolus insulin , with still suboptimal control.  At last visit, HbA1c was stable, at 7.1%, above target. - Sugars were higher in the 2 weeks prior to the visit, likely due to prednisone .  I advised her to use a higher dose of Lantus  while on steroids but to decrease the dose after coming off.  At that time, she already adjusted the NovoLog  up to cover postprandial excursions while on prednisone . CGM interpretation: -At today's visit, we reviewed her CGM downloads: It appears that 44% of values are in target range (goal >70%), while 56% are higher than 180 (goal <25%), and 0% are lower than 70 (goal <4%).  The calculated average blood sugar is 186.  The projected HbA1c for the next 3 months (GMI) is 7.8%. -Reviewing the CGM trends, sugars appear to be elevated, fluctuating around the upper limit of the target range overnight with an increase in blood sugars even before eating breakfast and a decrease to the target range or slightly above afterwards.  Sugars increase again after dinner.  She is taking a higher dose  of NovoLog  compared to Lantus  per day and she also feels that she is not responding as well to Wegovy  as before.  At today's visit, we discussed about increasing the Lantus  dose.  She is afraid that she may drop her blood sugars at night if she uses a higher dose so I advised her to take the entire dose in the morning.  In the meantime, we will also reduce the NovoLog  doses and we will try to switch from Wegovy  to Mounjaro  for stronger effect on her blood sugars -  I suggested to:  Patient Instructions  Please continue: - Metformin  ER 750 mg 2x a day - Farxiga  10 mg in am,  before b'fast  Try to change from Wegovy  to: - Mounjaro  5 mg weekly - let me know if we can increase the dose.  Increase: - Lantus  30 units daily in am - Novolog  10-15 units 15 min before meals  Please return in 4 months.  - we checked her HbA1c: 7.4% (higher) - advised to check sugars at different times of the day - 4x a day, rotating check times - advised for yearly eye exams >> she is UTD - return to clinic in 4 months  2. PCOS -Patient with clinically evident PCOS, confirmed biochemically in 2015, now perimenopausal -She was on oral contraceptives (Ortho-Cyclen), tolerated well >> then Nexplanon -She continues on metformin  ER and spironolactone .  She had a slightly high potassium in 2019 while on a potassium supplement, which we stopped.  He is also on an SGLT2 inhibitor, which can raise potassium.  Latest potassium level was normal: Lab Results  Component Value Date   K 4.9 12/20/2023  - Side against weight loss surgery - She continues Wegovy  at maximum dose, with good tolerance >> will try to switch to Mounjaro  for stronger effect on weight loss, also  3. HL - Latest lipid panel from 11/2023 showed fractions at goal: Lab Results  Component Value Date   CHOL 171 12/20/2023   HDL 62.80 12/20/2023   LDLCALC 90 12/20/2023   TRIG 94.0 12/20/2023   CHOLHDL 3 12/20/2023  - She continues on Crestor  10 mg  daily.  She had muscle cramps in the past but this resolved with better hydration  Lela Fendt, MD PhD [facility]

## 2024-01-07 ENCOUNTER — Encounter: Payer: Self-pay | Admitting: Internal Medicine

## 2024-01-15 ENCOUNTER — Telehealth: Payer: Self-pay

## 2024-01-15 NOTE — Progress Notes (Unsigned)
 Complex Care Management Note Care Guide Note  01/15/2024 Name: Audrey Goodman MRN: [id number] DOB: 06-Nov-1970   Complex Care Management Outreach Attempts: An unsuccessful telephone outreach was attempted today to offer the patient information about available complex care management services.  Follow Up Plan:  Additional outreach attempts will be made to offer the patient complex care management information and services.   Encounter Outcome:  No Answer  Dreama [facility]  [facility], [facility] VBCI Assistant Direct Dial: [phone]  Fax: [phone]

## 2024-01-16 ENCOUNTER — Other Ambulatory Visit: Payer: Self-pay | Admitting: Internal Medicine

## 2024-01-17 NOTE — Progress Notes (Signed)
 Complex Care Management Note  Care Guide Note 01/17/2024 Name: HANEEFAH VENTURINI MRN: [id number] DOB: [birth year]  [name] is a 53 y.o. year old female who sees Daryl Setter, NP for primary care. I reached out to [name] by phone today to offer complex care management services.  Ms. Schepp was given information about Complex Care Management services today including:   The Complex Care Management services include support from the care team which includes your Nurse Care Manager, Clinical Social Worker, or Pharmacist.  The Complex Care Management team is here to help remove barriers to the health concerns and goals most important to you. Complex Care Management services are voluntary, and the patient may decline or stop services at any time by request to their care team member.   Complex Care Management Consent Status: Patient agreed to services and verbal consent obtained.   Follow up plan:  Telephone appointment with complex care management team member scheduled for:  01/22/24 at 2:15 p.m.   Encounter Outcome:  Patient Scheduled  Dreama [facility]  [facility], [facility] VBCI Assistant Direct Dial: [phone]  Fax: [phone]

## 2024-01-20 ENCOUNTER — Telehealth: Payer: Self-pay

## 2024-01-20 NOTE — Progress Notes (Signed)
 Complex Care Management Note Care Guide Note  01/20/2024 Name: Audrey Goodman MRN: [id number] DOB: 04/26/1971   Complex Care Management Outreach Attempts: An unsuccessful telephone outreach was attempted today to offer the patient information about available complex care management services.  Follow Up Plan:  Additional outreach attempts will be made to offer the patient complex care management information and services.   Encounter Outcome:  No Answer  Dreama [facility]  [facility], [facility] VBCI Assistant Direct Dial: [phone]  Fax: [phone]

## 2024-01-22 ENCOUNTER — Other Ambulatory Visit

## 2024-01-24 ENCOUNTER — Encounter: Payer: Self-pay | Admitting: Family

## 2024-01-24 DIAGNOSIS — F902 Attention-deficit hyperactivity disorder, combined type: Secondary | ICD-10-CM

## 2024-01-26 ENCOUNTER — Encounter: Payer: Self-pay | Admitting: Internal Medicine

## 2024-01-26 ENCOUNTER — Encounter: Payer: Self-pay | Admitting: Family

## 2024-01-28 MED ORDER — AMPHETAMINE-DEXTROAMPHET ER 30 MG PO CP24: 30.0000 mg | ORAL_CAPSULE | ORAL | 0 refills | Status: DC

## 2024-01-28 MED ORDER — MOUNJARO 7.5 MG/0.5ML ~~LOC~~ SOAJ: 7.5000 mg | SUBCUTANEOUS | 0 refills | Status: DC

## 2024-01-28 MED ORDER — AMLODIPINE BESYLATE 5 MG PO TABS: 5.0000 mg | ORAL_TABLET | Freq: Every day | ORAL | 0 refills | Status: AC

## 2024-01-28 NOTE — Progress Notes (Signed)
 Complex Care Management Note Care Guide Note  01/28/2024 Name: Audrey Goodman MRN: [id number] DOB: Aug 26, 1970   Complex Care Management Outreach Attempts: A second unsuccessful outreach was attempted today to offer the patient with information about available complex care management services.  Follow Up Plan:  No further outreach attempts will be made at this time. We have been unable to contact the patient to offer or enroll patient in complex care management services.  Encounter Outcome:  No Answer  Dreama [facility]  [facility], [facility] VBCI Assistant Direct Dial: [phone]  Fax: [phone]

## 2024-02-05 LAB — HM DIABETES EYE EXAM

## 2024-02-18 ENCOUNTER — Other Ambulatory Visit: Payer: Self-pay | Admitting: Internal Medicine

## 2024-02-21 ENCOUNTER — Encounter: Payer: Self-pay | Admitting: Family

## 2024-02-21 DIAGNOSIS — F902 Attention-deficit hyperactivity disorder, combined type: Secondary | ICD-10-CM

## 2024-02-26 MED ORDER — AMPHETAMINE-DEXTROAMPHET ER 30 MG PO CP24: 30.0000 mg | ORAL_CAPSULE | ORAL | 0 refills | Status: DC

## 2024-02-26 NOTE — Addendum Note (Signed)
 Addended by: DARYL SETTER on: 02/26/2024 04:45 PM   Modules accepted: Orders

## 2024-02-29 ENCOUNTER — Encounter: Payer: Self-pay | Admitting: Internal Medicine

## 2024-03-03 MED ORDER — TIRZEPATIDE 10 MG/0.5ML ~~LOC~~ SOAJ: 10.0000 mg | SUBCUTANEOUS | 1 refills | Status: AC

## 2024-03-06 ENCOUNTER — Encounter: Payer: Self-pay | Admitting: Internal Medicine

## 2024-03-06 ENCOUNTER — Other Ambulatory Visit: Payer: Self-pay | Admitting: Sports Medicine

## 2024-03-08 ENCOUNTER — Encounter: Payer: Self-pay | Admitting: Sports Medicine

## 2024-03-09 MED ORDER — DAPAGLIFLOZIN PROPANEDIOL 10 MG PO TABS: 10.0000 mg | ORAL_TABLET | Freq: Every day | ORAL | 11 refills | Status: AC

## 2024-03-10 ENCOUNTER — Other Ambulatory Visit: Payer: Self-pay | Admitting: Sports Medicine

## 2024-03-24 ENCOUNTER — Ambulatory Visit: Admitting: Family

## 2024-03-24 ENCOUNTER — Telehealth: Payer: Self-pay | Admitting: Family

## 2024-03-24 VITALS — BP 112/60 | HR 112 | Temp 99.9°F | Resp 16 | Ht 65.0 in | Wt 252.0 lb

## 2024-03-24 DIAGNOSIS — F32A Depression, unspecified: Secondary | ICD-10-CM

## 2024-03-24 DIAGNOSIS — I10 Essential (primary) hypertension: Secondary | ICD-10-CM

## 2024-03-24 DIAGNOSIS — F902 Attention-deficit hyperactivity disorder, combined type: Secondary | ICD-10-CM

## 2024-03-24 DIAGNOSIS — E785 Hyperlipidemia, unspecified: Secondary | ICD-10-CM

## 2024-03-24 DIAGNOSIS — T753XXA Motion sickness, initial encounter: Secondary | ICD-10-CM

## 2024-03-24 DIAGNOSIS — Z7985 Long-term (current) use of injectable non-insulin antidiabetic drugs: Secondary | ICD-10-CM

## 2024-03-24 DIAGNOSIS — R0683 Snoring: Secondary | ICD-10-CM

## 2024-03-24 DIAGNOSIS — Z0000 Encounter for general adult medical examination without abnormal findings: Secondary | ICD-10-CM

## 2024-03-24 DIAGNOSIS — Z23 Encounter for immunization: Secondary | ICD-10-CM | POA: Diagnosis not present

## 2024-03-24 DIAGNOSIS — E1165 Type 2 diabetes mellitus with hyperglycemia: Secondary | ICD-10-CM | POA: Diagnosis not present

## 2024-03-24 DIAGNOSIS — H8112 Benign paroxysmal vertigo, left ear: Secondary | ICD-10-CM | POA: Insufficient documentation

## 2024-03-24 LAB — COMPREHENSIVE METABOLIC PANEL WITH GFR
ALT: 20 U/L (ref 0–35)
AST: 15 U/L (ref 0–37)
Albumin: 4.6 g/dL (ref 3.5–5.2)
Alkaline Phosphatase: 66 U/L (ref 39–117)
BUN: 16 mg/dL (ref 6–23)
CO2: 25 meq/L (ref 19–32)
Calcium: 9.9 mg/dL (ref 8.4–10.5)
Chloride: 102 meq/L (ref 96–112)
Creatinine, Ser: 1.1 mg/dL (ref 0.40–1.20)
GFR: 57.84 mL/min — ABNORMAL LOW (ref >60.00)
Glucose, Bld: 150 mg/dL — ABNORMAL HIGH (ref 70–99)
Potassium: 5 meq/L (ref 3.5–5.1)
Sodium: 137 meq/L (ref 135–145)
Total Bilirubin: 0.7 mg/dL (ref 0.2–1.2)
Total Protein: 7.5 g/dL (ref 6.0–8.3)

## 2024-03-24 LAB — LIPID PANEL
Cholesterol: 173 mg/dL (ref 0–200)
HDL: 54.9 mg/dL (ref >39.00)
LDL Cholesterol: 98 mg/dL (ref 0–99)
NonHDL: 118.47
Total CHOL/HDL Ratio: 3
Triglycerides: 103 mg/dL (ref 0.0–149.0)
VLDL: 20.6 mg/dL (ref 0.0–40.0)

## 2024-03-24 MED ORDER — FLUOXETINE HCL 20 MG PO CAPS: 40.0000 mg | ORAL_CAPSULE | Freq: Every day | ORAL | 1 refills | Status: AC

## 2024-03-24 MED ORDER — AMPHETAMINE-DEXTROAMPHET ER 30 MG PO CP24: 30.0000 mg | ORAL_CAPSULE | ORAL | 0 refills | Status: DC

## 2024-03-24 MED ORDER — MECLIZINE HCL 25 MG PO TABS: 25.0000 mg | ORAL_TABLET | Freq: Three times a day (TID) | ORAL | 0 refills | Status: AC | PRN

## 2024-03-24 MED ORDER — SCOPOLAMINE 1 MG/3DAYS TD PT72: 1.0000 | MEDICATED_PATCH | TRANSDERMAL | 0 refills | Status: DC

## 2024-03-24 MED ORDER — AMPHETAMINE-DEXTROAMPHETAMINE 5 MG PO TABS: 5.0000 mg | ORAL_TABLET | Freq: Every day | ORAL | 0 refills | Status: DC

## 2024-03-24 NOTE — Assessment & Plan Note (Signed)
 New. Rx with meclizine prn- if symptoms worsen or fail to improve, plan to refer

## 2024-03-24 NOTE — Patient Instructions (Addendum)
 VISIT SUMMARY:  Today, you had your annual physical exam. We reviewed your type 2 diabetes, hypertension, and other health concerns. Your recent lab results and medications were discussed, and we made some adjustments to your treatment plan. We also addressed your concerns about motion sickness for your upcoming trip and other health maintenance needs.  YOUR PLAN:  -ADULT WELLNESS VISIT: Your routine wellness visit was normal, including a Pap smear and mammogram. Early cataracts were noted, but no immediate action is needed. We will obtain hard copies of your recent Pap smear and mammogram results for your chart. You received the Twinrix vaccine for Hepatitis A and B, and we recommend getting the RSV vaccine at a pharmacy. Continue with your routine dental and eye check-ups.  -TYPE 2 DIABETES MELLITUS, UNCONTROLLED: Your A1c level has increased to 7.4, indicating that your blood sugar is not as well controlled as it should be. We will continue your current diabetes medications and encourage you to focus on weight management strategies. We will recheck your A1c at your next endocrinology visit in December.  -HYPERLIPIDEMIA IN DIABETES: Your LDL cholesterol level is 90, which is above the target of less than 70. We will repeat your lipid panel today and may consider increasing your Crestor  dosage based on the results.  -HYPERTENSION: Your blood pressure is well-controlled with Amlodipine . Continue taking this medication as prescribed.  -OBESITY: Your weight is affecting your health. We encourage you to continue focusing on weight management strategies.  -MAJOR DEPRESSIVE DISORDER: You are experiencing a depressive period that is affecting your motivation and weight management. We will continue to monitor this.  -ATTENTION-DEFICIT HYPERACTIVITY DISORDER: You have ADHD, which is currently managed with Adderall. To help with your afternoon fatigue and decreased focus, we will increase your Adderall  dosage to 40 mg and prescribe a low-dose quick-release Adderall for afternoon use.  -LUMBAR SPINAL STENOSIS WITH RADICULOPATHY: You have lumbar spinal stenosis, which is causing back and hip pain and affecting your physical activity. We will continue to monitor this condition.  -GAIT ABNORMALITY AND FOOT DROP: You are experiencing stability issues and occasional foot drop, which affects your walking. We will continue to monitor this condition.  -VERTIGO AND MOTION SICKNESS: You have intermittent vertigo and motion sickness, especially with visual stimuli and positional changes. For your upcoming trip, we will prescribe a scopolamine  patch to help with motion sickness. We will also evaluate you for potential inner ear issues.  -CERUMEN IMPACTION, LEFT EAR: You have earwax buildup in your left ear, which may be contributing to hearing issues. Purchase over the counter ear wax drops and flush per package instructions.  -EARLY CATARACTS: You have early cataract development, which means the lens of your eye is becoming cloudy. Continue with routine eye examinations to monitor this.  INSTRUCTIONS:  Please follow up with your endocrinologist in December to recheck your A1c. Get your lipid panel done today, and we will adjust your Crestor  dosage if needed. Continue regular follow-ups with your nephrologist every 8 months. For your upcoming trip, use the prescribed scopolamine  patch to help with motion sickness. Continue with routine dental and eye check-ups, and get the RSV vaccine at a pharmacy.

## 2024-03-24 NOTE — Assessment & Plan Note (Signed)
 Uncontrolled on prozac  30 mg. Will try increasing to 40mg .  She will send us  a mychart message in a few weeks to let us  know how she is doing with adjustments in prozac  and adderall.

## 2024-03-24 NOTE — Telephone Encounter (Signed)
 Please call to request pap/mammo from GYN. ([facility]).

## 2024-03-24 NOTE — Progress Notes (Signed)
 I spoke to patient today. She said she has gotten coupons for Mounjaro  and Farxiga  which has helped with medication cost. I encouraged her to also go to Novo Nordisk / Novolog  and [employer]. Lantus  websites to check on coupons for those products as well.  She did not want to set up appointment but I did provide patient my phone number [phone] so that she can call if me if medication cost issues come up again.

## 2024-03-24 NOTE — Telephone Encounter (Signed)
Electronic request sent for both

## 2024-03-24 NOTE — Assessment & Plan Note (Signed)
 Lab Results  Component Value Date   HGBA1C 7.4 (A) 01/06/2024   HGBA1C 7.1 (H) 08/23/2023   HGBA1C 7.2 (H) 02/20/2023   Lab Results  Component Value Date   MICROALBUR 1.1 03/21/2020   LDLCALC 90 12/20/2023   CREATININE 0.95 12/20/2023   She is followed by Endo.  Last A1C slightly above goal but appetite has been suppressed more by mounjaro .

## 2024-03-24 NOTE — Progress Notes (Signed)
 Subjective:     Patient ID: Audrey Goodman, female    DOB: [birth year], 53 y.o.   MRN: [id number]  Chief Complaint  Patient presents with   Annual Exam    HPI  Discussed the use of AI scribe software for clinical note transcription with the patient, who gave verbal consent to proceed.  History of Present Illness  Audrey Goodman is a 53 year old female with type 2 diabetes and hypertension who presents for an annual physical exam.  She has type 2 diabetes with a recent A1c of 7.4, slightly elevated from 7.1. Her medications include Farxiga , Lantus  30 units at night, metformin , Mounjaro , and Novolog  sliding scale, which she uses less frequently due to decreased hunger. She takes amlodipine  for hypertension and Crestor  20 mg for cholesterol, with a recent LDL of 90.  She experiences depressive symptoms and lack of motivation, affecting her physical activity. She has stopped going to the gym due to cold weather and fatigue after work. Back and hip pain from lumbar lateral stenosis contribute to her decreased activity.  She underwent a sleep study and experiences snoring. She has vertigo-like symptoms when turning to her left side and after watching movies with flashing lights, with a sensation of the room spinning and intermittent hearing changes in her left ear.  She is planning a trip to [place] and is concerned about motion sickness on rides, as Dramamine is ineffective for her.  Her recent eye exam showed no diabetic retinopathy but early cataract development.  She takes Adderall 30 mg for ADHD, which helps but causes an afternoon crash. She is considering an increase in dosage. She is also considering moving for financial reasons, possibly to [place]  or [place], and is concerned about medication expenses.  Immunizations:  she plans to get RSV at pharmacy Diet: working on diet Exercise: limited due to pain. Colonoscopy: 2022 good for 10 years Pap Smear: up to date Mammogram:  up to date Vision: up to date Dental: up to date     03/24/2024    8:51 AM 08/23/2023   10:14 AM 02/20/2023    3:33 PM  PHQ9 SCORE ONLY  PHQ-9 Total Score 5 5  2       Data saved with a previous flowsheet row definition       Health Maintenance Due  Topic Date Due   Cervical Cancer Screening (HPV/Pap Cotest)  12/02/2023   Mammogram  12/21/2023    Past Medical History:  Diagnosis Date   Anxiety    Arthritis    left shoulder   Back pain    Chronic kidney disease    stage 1   Depression    Diabetes mellitus without complication (HCC)    type 2- on meds   Diverticulitis    High cholesterol    History of chicken pox    Hyperlipidemia    on meds   Hypertension    on meds   Joint pain    Lactose intolerance    PCOS (polycystic ovarian syndrome)    Seasonal allergies    Shingles    Sleep apnea    does NOT use CPAP   SVD (spontaneous vaginal delivery) 1995   x 1   Type 2 diabetes mellitus, uncontrolled 07/30/2006   Qualifier: Diagnosis of  By: Lucille  MD, [place]      Past Surgical History:  Procedure Laterality Date   ANKLE FRACTURE SURGERY  05/28/1989   right   CHOLECYSTECTOMY  05/28/2006  COLONOSCOPY  1989   due to blood in stool and diverticulitis per pt   HYSTEROSCOPY N/A 09/13/2014   Procedure: HYSTEROSCOPY with IUD removal;  Surgeon: Duwaine Blumenthal, DO;  Location: [facility];  Service: Gynecology;  Laterality: N/A;  need longest speculum and instruments that aresavailable due to patient's habitus (BMI ~60)    Family History  Problem Relation Age of Onset   Obesity Mother    Colon polyps Mother    Cancer Mother        breast   Hyperlipidemia Mother    Heart disease Mother    Hypertension Mother    Mental illness Mother        depression   Diabetes Mother    Alcoholism Father    Hyperlipidemia Father    Stroke Father    Hypertension Father    Cancer Maternal Grandmother        colon   Diabetes Maternal Grandmother    Diabetes Maternal Aunt     Glaucoma Maternal Aunt    Colon polyps Maternal Uncle    Colon cancer Maternal Uncle    Diabetes Maternal Uncle    Diabetes Maternal Uncle    Esophageal cancer Neg Hx    Rectal cancer Neg Hx    Stomach cancer Neg Hx     Social History   Socioeconomic History   Marital status: Single    Spouse name: Not on file   Number of children: Not on file   Years of education: Not on file   Highest education level: Bachelor's degree (e.g., BA, AB, BS)  Occupational History   Not on file  Tobacco Use   Smoking status: Never   Smokeless tobacco: Never  Vaping Use   Vaping status: Never Used  Substance and Sexual Activity   Alcohol use: No    Alcohol/week: 0.0 standard drinks of alcohol   Drug use: No   Sexual activity: Yes    Partners: Male    Birth control/protection: Pill  Other Topics Concern   Not on file  Social History Narrative   Single- lives alone   Son 21- senior at [employer]   Enjoys reading   Works as a advertising account planner at [employer]: Low Risk  (03/17/2024)   Overall Financial Resource Strain (CARDIA)    Difficulty of Paying Living Expenses: Not very hard  Food Insecurity: No Food Insecurity (03/17/2024)   Hunger Vital Sign    Worried About Running Out of Food in the Last Year: Never true    Ran Out of Food in the Last Year: Never true  Transportation Needs: No Transportation Needs (03/17/2024)   PRAPARE - Administrator, Civil Service (Medical): No    Lack of Transportation (Non-Medical): No  Physical Activity: Insufficiently Active (03/17/2024)   Exercise Vital Sign    Days of Exercise per Week: 2 days    Minutes of Exercise per Session: 30 min  Stress: No Stress Concern Present (03/17/2024)   [employer] of Occupational Health - Occupational Stress Questionnaire    Feeling of Stress: Only a little  Social Connections: Unknown (03/17/2024)   Social Connection and Isolation Panel     Frequency of Communication with Friends and Family: Twice a week    Frequency of Social Gatherings with Friends and Family: Patient declined    Attends Religious Services: 1 to 4 times per year    Active Member of Clubs or Organizations: No  Attends Banker Meetings: Not on file    Marital Status: Never married  Intimate Partner Violence: Not on file    Outpatient Medications Prior to Visit  Medication Sig Dispense Refill   amLODipine  (NORVASC ) 5 MG tablet Take 1 tablet (5 mg total) by mouth daily. 90 tablet 0   celecoxib  (CELEBREX ) 100 MG capsule TAKE 1 CAPSULE BY MOUTH TWICE A DAY 60 capsule 1   cetirizine (ZYRTEC) 10 MG tablet Take 10 mg by mouth daily.     Continuous Glucose Sensor (DEXCOM G7 SENSOR) MISC USE TO CHECK GLUCOSE CONTINUOUSLY, CHANGE EVERY 10 DAYS 3 each 3   dapagliflozin  propanediol (FARXIGA ) 10 MG TABS tablet Take 1 tablet (10 mg total) by mouth daily. Fill as generic. 30 tablet 11   fluticasone  (FLONASE ) 50 MCG/ACT nasal spray Place 2 sprays into both nostrils daily. 48 g 1   insulin  aspart (NOVOLOG  FLEXPEN) 100 UNIT/ML FlexPen Inject 12-15 Units into the skin 3 (three) times daily with meals. INJECT 12-15 UNITS (DEPENDING ON MEAL SIZE) UNDER THE SKIN 3 TIMES A DAYINJECT 12-15 UNITS (DEPENDING ON MEAL SIZE) UNDER THE SKIN 3 TIMES A DAY (Patient taking differently: Inject 20-40 Units into the skin 3 (three) times daily with meals. INJECT 12-15 UNITS (DEPENDING ON MEAL SIZE) UNDER THE SKIN 3 TIMES A DAYINJECT 12-15 UNITS (DEPENDING ON MEAL SIZE) UNDER THE SKIN 3 TIMES A DAY) 45 mL 3   insulin  glargine (LANTUS  SOLOSTAR) 100 UNIT/ML Solostar Pen Inject 30 Units into the skin daily.     Insulin  Pen Needle 32G X 4 MM MISC Use to inject insulin  4 times daily 400 each 3   metFORMIN  (GLUCOPHAGE -XR) 750 MG 24 hr tablet TAKE 1 TABLET BY MOUTH TWICE A DAY 180 tablet 3   Multiple Vitamin (MULTIVITAMIN PO) Take 1 tablet by mouth daily at 6 (six) AM.     rosuvastatin   (CRESTOR ) 20 MG tablet Take 1 tablet (20 mg total) by mouth daily. 90 tablet 1   spironolactone  (ALDACTONE ) 50 MG tablet Take 1 tablet (50 mg total) by mouth 2 (two) times daily. 180 tablet 3   tirzepatide  (MOUNJARO ) 10 MG/0.5ML Pen Inject 10 mg into the skin once a week. 6 mL 1   amphetamine -dextroamphetamine  (ADDERALL XR) 30 MG 24 hr capsule Take 1 capsule (30 mg total) by mouth every morning. 30 capsule 0   FLUoxetine  (PROZAC ) 10 MG capsule Take 3 capsules (30 mg total) by mouth daily. 270 capsule 1   No facility-administered medications prior to visit.    Allergies  Allergen Reactions   Lisinopril  Swelling    Swelling of the left side of tongue   Lipitor [Atorvastatin ] Other (See Comments)    Myalgia/foot cramping    ROS See HPI    Objective:    Physical Exam Constitutional:      General: She is not in acute distress.    Appearance: Normal appearance. She is well-developed.  HENT:     Head: Normocephalic and atraumatic.     Right Ear: Tympanic membrane, ear canal and external ear normal.     Left Ear: Tympanic membrane, ear canal and external ear normal.     Mouth/Throat:     Mouth: Mucous membranes are moist.     Pharynx: No posterior oropharyngeal erythema.  Eyes:     General: No scleral icterus.    Extraocular Movements: Extraocular movements intact.     Pupils: Pupils are equal, round, and reactive to light.  Neck:     Thyroid :  No thyromegaly.  Cardiovascular:     Rate and Rhythm: Normal rate and regular rhythm.     Heart sounds: Normal heart sounds. No murmur heard. Pulmonary:     Effort: Pulmonary effort is normal. No respiratory distress.     Breath sounds: Normal breath sounds. No wheezing.  Musculoskeletal:        General: No swelling.     Cervical back: Neck supple.  Skin:    General: Skin is warm and dry.  Neurological:     Mental Status: She is alert and oriented to person, place, and time.     Comments: Mildly positive Trenda Shona Grebe left   Psychiatric:        Mood and Affect: Mood normal. Affect is flat.        Behavior: Behavior normal.        Thought Content: Thought content normal.        Judgment: Judgment normal.      BP 112/60 (BP Location: Right Arm, Patient Position: Sitting, Cuff Size: Large)   Pulse (!) 112   Temp 99.9 F (37.7 C) (Oral)   Resp 16   Ht 5' 5 (1.651 m)   Wt 252 lb (114.3 kg)   SpO2 99%   BMI 41.93 kg/m  Wt Readings from Last 3 Encounters:  03/24/24 252 lb (114.3 kg)  01/06/24 251 lb 3.2 oz (113.9 kg)  12/20/23 249 lb 9.6 oz (113.2 kg)       Assessment & Plan:   Problem List Items Addressed This Visit       Unprioritized   Uncontrolled diabetes mellitus with hyperglycemia (HCC) - Primary   Lab Results  Component Value Date   HGBA1C 7.4 (A) 01/06/2024   HGBA1C 7.1 (H) 08/23/2023   HGBA1C 7.2 (H) 02/20/2023   Lab Results  Component Value Date   MICROALBUR 1.1 03/21/2020   LDLCALC 90 12/20/2023   CREATININE 0.95 12/20/2023   She is followed by Endo.  Last A1C slightly above goal but appetite has been suppressed more by mounjaro .         Snoring   Negative sleep study.      HYPERTENSION, BENIGN ESSENTIAL   BP Readings from Last 3 Encounters:  03/24/24 112/60  01/06/24 118/64  12/20/23 135/75   BP stable on amlodipine .        Hyperlipidemia   Lab Results  Component Value Date   CHOL 171 12/20/2023   HDL 62.80 12/20/2023   LDLCALC 90 12/20/2023   TRIG 94.0 12/20/2023   CHOLHDL 3 12/20/2023   On crestor , ldl >70, update today, consider adjusting crestor .       Relevant Orders   Comp Met (CMET)   Lipid panel   Depression   Uncontrolled on prozac  30 mg. Will try increasing to 40mg .  She will send us  a mychart message in a few weeks to let us  know how she is doing with adjustments in prozac  and adderall.      Relevant Medications   FLUoxetine  (PROZAC ) 20 MG capsule   Benign paroxysmal positional vertigo of left ear   Relevant Medications   meclizine  (ANTIVERT) 25 MG tablet   Attention deficit hyperactivity disorder, combined type, moderate   Contract up to date.  Continues adderall which she continues to find helpful except it wanes in the afternoon. Will add 5mg  rapid release in the afternoon.       Relevant Medications   amphetamine -dextroamphetamine  (ADDERALL) 5 MG tablet   amphetamine -dextroamphetamine  (ADDERALL XR)  30 MG 24 hr capsule   Other Visit Diagnoses       Motion sickness, initial encounter       Relevant Medications   scopolamine  (TRANSDERM-SCOP) 1 MG/3DAYS       I have discontinued Avilyn C. Burleigh's FLUoxetine . I am also having her start on scopolamine , amphetamine -dextroamphetamine , meclizine, and FLUoxetine . Additionally, I am having her maintain her Multiple Vitamin (MULTIVITAMIN PO), cetirizine, fluticasone , Insulin  Pen Needle, NovoLOG  FlexPen, spironolactone , rosuvastatin , Lantus  SoloStar, Dexcom G7 Sensor, amLODipine , metFORMIN , tirzepatide , celecoxib , dapagliflozin  propanediol, and amphetamine -dextroamphetamine .  Meds ordered this encounter  Medications   scopolamine  (TRANSDERM-SCOP) 1 MG/3DAYS    Sig: Place 1 patch (1 mg total) onto the skin every 3 (three) days.    Dispense:  4 patch    Refill:  0    Supervising Provider:   DOMENICA BLACKBIRD A [[id number]]   amphetamine -dextroamphetamine  (ADDERALL) 5 MG tablet    Sig: Take 1 tablet (5 mg total) by mouth daily.    Dispense:  30 tablet    Refill:  0    Supervising Provider:   DOMENICA BLACKBIRD A [[id number]]   amphetamine -dextroamphetamine  (ADDERALL XR) 30 MG 24 hr capsule    Sig: Take 1 capsule (30 mg total) by mouth every morning.    Dispense:  30 capsule    Refill:  0    Supervising Provider:   DOMENICA BLACKBIRD A [[id number]]   meclizine (ANTIVERT) 25 MG tablet    Sig: Take 1 tablet (25 mg total) by mouth 3 (three) times daily as needed for dizziness.    Dispense:  30 tablet    Refill:  0    Supervising Provider:   DOMENICA BLACKBIRD A [[id number]]   FLUoxetine  (PROZAC ) 20 MG capsule     Sig: Take 2 capsules (40 mg total) by mouth daily.    Dispense:  180 capsule    Refill:  1    Cancel rx for 30mg  daily    Supervising Provider:   DOMENICA BLACKBIRD A [[id number]]

## 2024-03-24 NOTE — Assessment & Plan Note (Signed)
Negative sleep study

## 2024-03-24 NOTE — Assessment & Plan Note (Signed)
 Lab Results  Component Value Date   CHOL 171 12/20/2023   HDL 62.80 12/20/2023   LDLCALC 90 12/20/2023   TRIG 94.0 12/20/2023   CHOLHDL 3 12/20/2023   On crestor , ldl >70, update today, consider adjusting crestor .

## 2024-03-24 NOTE — Assessment & Plan Note (Signed)
 BP Readings from Last 3 Encounters:  03/24/24 112/60  01/06/24 118/64  12/20/23 135/75   BP stable on amlodipine .

## 2024-03-24 NOTE — Assessment & Plan Note (Addendum)
 Contract up to date.  Continues adderall which she continues to find helpful except it wanes in the afternoon. Will add 5mg  rapid release in the afternoon.

## 2024-03-27 ENCOUNTER — Encounter: Payer: Self-pay | Admitting: Family

## 2024-03-30 ENCOUNTER — Encounter: Payer: Self-pay | Admitting: Radiology

## 2024-03-31 ENCOUNTER — Ambulatory Visit (INDEPENDENT_AMBULATORY_CARE_PROVIDER_SITE_OTHER)

## 2024-03-31 DIAGNOSIS — Z23 Encounter for immunization: Secondary | ICD-10-CM

## 2024-03-31 NOTE — Progress Notes (Signed)
 Pt presents today for her 2nd twinrx (Hep A & Hep B) vaccine. She received vaccine in left deltoid and tolerated vaccine well.

## 2024-04-11 ENCOUNTER — Other Ambulatory Visit: Payer: Self-pay | Admitting: Family

## 2024-04-14 ENCOUNTER — Ambulatory Visit (INDEPENDENT_AMBULATORY_CARE_PROVIDER_SITE_OTHER)

## 2024-04-14 DIAGNOSIS — Z23 Encounter for immunization: Secondary | ICD-10-CM

## 2024-04-14 NOTE — Progress Notes (Signed)
 Pt was in office today for 3rd dose of Hep A and B vaccine, vaccine was given in R deltoid and pt tolerated well.

## 2024-04-27 ENCOUNTER — Encounter: Payer: Self-pay | Admitting: Family

## 2024-04-27 DIAGNOSIS — F902 Attention-deficit hyperactivity disorder, combined type: Secondary | ICD-10-CM

## 2024-04-28 ENCOUNTER — Encounter: Payer: Self-pay | Admitting: Family

## 2024-04-28 ENCOUNTER — Other Ambulatory Visit: Payer: Self-pay | Admitting: Sports Medicine

## 2024-04-28 ENCOUNTER — Encounter: Payer: Self-pay | Admitting: Internal Medicine

## 2024-04-28 ENCOUNTER — Encounter: Payer: Self-pay | Admitting: Sports Medicine

## 2024-04-28 DIAGNOSIS — F902 Attention-deficit hyperactivity disorder, combined type: Secondary | ICD-10-CM

## 2024-04-28 MED ORDER — AMPHETAMINE-DEXTROAMPHET ER 30 MG PO CP24: 30.0000 mg | ORAL_CAPSULE | ORAL | 0 refills | Status: DC

## 2024-04-28 MED ORDER — AMPHETAMINE-DEXTROAMPHETAMINE 5 MG PO TABS: 5.0000 mg | ORAL_TABLET | Freq: Every day | ORAL | 0 refills | Status: AC

## 2024-04-28 MED ORDER — CELECOXIB 100 MG PO CAPS: 100.0000 mg | ORAL_CAPSULE | Freq: Two times a day (BID) | ORAL | 1 refills | Status: AC

## 2024-04-28 NOTE — Telephone Encounter (Signed)
 Requesting: Adderall 5mg  Contract: 12/20/23 UDS: 12/20/23 Last Visit: 03/24/24 Next Visit: 06/24/24 Last Refill: 03/24/24 #30 and 0RF    Please Advise

## 2024-04-28 NOTE — Telephone Encounter (Signed)
 Requesting: Adderall XR 30mg   Contract: 12/20/23 UDS: 12/20/23 Last Visit: 03/24/24 Next Visit: 06/24/24 Last Refill: 03/24/24 #30 and 0RF   Please Advise

## 2024-05-18 ENCOUNTER — Ambulatory Visit: Admitting: Internal Medicine

## 2024-05-20 ENCOUNTER — Encounter: Payer: Self-pay | Admitting: Internal Medicine

## 2024-05-22 ENCOUNTER — Ambulatory Visit: Admitting: Internal Medicine

## 2024-05-22 NOTE — Progress Notes (Deleted)
 [name] ID: Audrey Goodman, female   DOB: [birth year], 53 y.o.   MRN: [id number]   HPI: Audrey Goodman is a 53 y.o.-year-old female, returning for f/u for DM2, dx with GDM in 1995, then DM 6 mo after son was born, insulin -dependent since ~2010, controlled, without long-term complications and PCOS. Last visit 4 months ago.  Interim history: No increased urination, blurry vision, nausea, chest pain.  She continues to have steroids p.o. and injections for sciatica and also for hip pain.  She lost 10 lbs before last visit.  Reviewed HbA1c levels: Lab Results  Component Value Date   HGBA1C 7.4 (A) 01/06/2024   HGBA1C 7.1 (H) 08/23/2023   HGBA1C 7.2 (H) 02/20/2023  06/07/2022: HbA1c 7.2% 05/31/2021: HbA1c 7.0%  At last visit she was on: - Metformin  ER 750 mg 2x a day - Farxiga  10 mg in am, before b'fast - Lantus  20 units at bedtime >> split 10 units 2x day >> 10 units in am and 6 units at night - Novolog  8-15 >> 10-20 >> 20-40 >> 15-25 units before meals.  - Trulicity  4.5 mg weekly >> Wegovy  2.4 mg weekly Of note, she could not tolerate Jardiance  10 (started 01/2018) because of increased urination.  We changed to: - Metformin  ER 750 mg 2x a day - Farxiga  10 mg in am, before b'fast - Tirzepatide  10 mg weekly  - Lantus  30 units daily in am - Novolog  10-15 units 15 min before meals  Pt checks her sugars >4x a day:  Previously  Previously:   Lowest sugar was  54 >> 84 >> 50s; she has hypoglycemia awareness in the 80s. Highest sugar was 501 (streroid) >> .SABRA.288 >> 201 >> 200s.  + CKD with proteinuria-sees nephrology (Dr. Gearline), last BUN/creatinine:  Lab Results  Component Value Date   BUN 16 03/24/2024   CREATININE 1.10 03/24/2024   Lab Results  Component Value Date   MICRALBCREAT n/c 09/12/2023   MICRALBCREAT 10 03/20/2023   MICRALBCREAT 7 06/08/2022   MICRALBCREAT 11 03/21/2020  She is off lisinopril .  + HL; last set of lipids: Lab Results  Component Value Date    CHOL 173 03/24/2024   HDL 54.90 03/24/2024   LDLCALC 98 03/24/2024   TRIG 103.0 03/24/2024   CHOLHDL 3 03/24/2024  She had leg cramps with Crestor  10. Started drinking more water >> helped.  - last eye exam was: 02/05/2024-no DR.  - no numbness and tingling in her feet.  Last foot exam 09/12/2023. On Gabapentin.  PCOS: -She had irregular menses since menarche -+1 pregnancies, no miscarriages - was on Mirena IUD - for ~4.5 years >> then on Sprintec since 2016 >> then Orthocycen >> now Nexplanon since 2020 - She continues on metformin  ER 750 mg twice a day and spironolactone  50 mg twice a day.  She feels that this helps with her hirsutism.  Latest potassium level is normal: Lab Results  Component Value Date   K 5.0 03/24/2024   She was previously seen with weight management clinic at [facility] - nonsurgical weight loss program.  She lost 20 pounds on their diet but could not be continue due to price.   ROS: + see HPI  I reviewed pt's medications, allergies, PMH, social hx, family hx, and changes were documented in the history of present illness. Otherwise, unchanged from my initial visit note.  Past Medical History:  Diagnosis Date   Anxiety    Arthritis    left shoulder   Back pain  Chronic kidney disease    stage 1   Depression    Diabetes mellitus without complication (HCC)    type 2- on meds   Diverticulitis    High cholesterol    History of chicken pox    Hyperlipidemia    on meds   Hypertension    on meds   Joint pain    Lactose intolerance    PCOS (polycystic ovarian syndrome)    Seasonal allergies    Shingles    Sleep apnea    does NOT use CPAP   SVD (spontaneous vaginal delivery) 1995   x 1   Type 2 diabetes mellitus, uncontrolled 07/30/2006   Qualifier: Diagnosis of  By: Lucille  MD, Preston     Past Surgical History:  Procedure Laterality Date   ANKLE FRACTURE SURGERY  05/28/1989   right   CHOLECYSTECTOMY  05/28/2006   COLONOSCOPY  1989    due to blood in stool and diverticulitis per pt   HYSTEROSCOPY N/A 09/13/2014   Procedure: HYSTEROSCOPY with IUD removal;  Surgeon: Duwaine Blumenthal, DO;  Location: [facility];  Service: Gynecology;  Laterality: N/A;  need longest speculum and instruments that aresavailable due to patient's habitus (BMI ~60)   Social History   Socioeconomic History   Marital status: Single    Spouse name: Not on file   Number of children: Not on file   Years of education: Not on file   Highest education level: Bachelor's degree (e.g., BA, AB, BS)  Occupational History   Not on file  Tobacco Use   Smoking status: Never   Smokeless tobacco: Never  Vaping Use   Vaping status: Never Used  Substance and Sexual Activity   Alcohol use: No    Alcohol/week: 0.0 standard drinks of alcohol   Drug use: No   Sexual activity: Yes    Partners: Male    Birth control/protection: Pill  Other Topics Concern   Not on file  Social History Narrative   Single- lives alone   Son 21- senior at [employer]   Enjoys reading   Works as a advertising account planner at [employer]   Tobacco Use: Low Risk (01/06/2024)   Patient History    Smoking Tobacco Use: Never    Smokeless Tobacco Use: Never    Passive Exposure: Not on file  Financial Resource Strain: Low Risk (03/17/2024)   Overall Financial Resource Strain (CARDIA)    Difficulty of Paying Living Expenses: Not very hard  Food Insecurity: No Food Insecurity (03/17/2024)   Epic    Worried About Radiation Protection Practitioner of Food in the Last Year: Never true    Ran Out of Food in the Last Year: Never true  Transportation Needs: No Transportation Needs (03/17/2024)   Epic    Lack of Transportation (Medical): No    Lack of Transportation (Non-Medical): No  Physical Activity: Insufficiently Active (03/17/2024)   Exercise Vital Sign    Days of Exercise per Week: 2 days    Minutes of Exercise per Session: 30 min  Stress: No Stress Concern Present (03/17/2024)    [employer] of Occupational Health - Occupational Stress Questionnaire    Feeling of Stress: Only a little  Social Connections: Unknown (03/17/2024)   Social Connection and Isolation Panel    Frequency of Communication with Friends and Family: Twice a week    Frequency of Social Gatherings with Friends and Family: Patient declined    Attends Religious Services: 1 to 4  times per year    Active Member of Clubs or Organizations: No    Attends Banker Meetings: Not on file    Marital Status: Never married  Intimate Partner Violence: Not on file  Depression (PHQ2-9): Medium Risk (03/24/2024)   Depression (PHQ2-9)    PHQ-2 Score: 5  Alcohol Screen: Low Risk (03/17/2024)   Alcohol Screen    Last Alcohol Screening Score (AUDIT): 1  Housing: High Risk (03/17/2024)   Epic    Unable to Pay for Housing in the Last Year: Yes    Number of Times Moved in the Last Year: 0    Homeless in the Last Year: No  Utilities: Not on file  Health Literacy: Not on file   Current Outpatient Medications  Medication Sig Dispense Refill   amLODipine  (NORVASC ) 5 MG tablet Take 1 tablet (5 mg total) by mouth daily. 90 tablet 0   amphetamine -dextroamphetamine  (ADDERALL XR) 30 MG 24 hr capsule Take 1 capsule (30 mg total) by mouth every morning. 30 capsule 0   amphetamine -dextroamphetamine  (ADDERALL) 5 MG tablet Take 1 tablet (5 mg total) by mouth daily. 30 tablet 0   celecoxib  (CELEBREX ) 100 MG capsule Take 1 capsule (100 mg total) by mouth 2 (two) times daily. 60 capsule 1   cetirizine (ZYRTEC) 10 MG tablet Take 10 mg by mouth daily.     Continuous Glucose Sensor (DEXCOM G7 SENSOR) MISC USE TO CHECK GLUCOSE CONTINUOUSLY, CHANGE EVERY 10 DAYS 3 each 3   dapagliflozin  propanediol (FARXIGA ) 10 MG TABS tablet Take 1 tablet (10 mg total) by mouth daily. Fill as generic. 30 tablet 11   FLUoxetine  (PROZAC ) 20 MG capsule Take 2 capsules (40 mg total) by mouth daily. 180 capsule 1   fluticasone  (FLONASE )  50 MCG/ACT nasal spray Place 2 sprays into both nostrils daily. 48 g 1   insulin  aspart (NOVOLOG  FLEXPEN) 100 UNIT/ML FlexPen Inject 12-15 Units into the skin 3 (three) times daily with meals. INJECT 12-15 UNITS (DEPENDING ON MEAL SIZE) UNDER THE SKIN 3 TIMES A DAYINJECT 12-15 UNITS (DEPENDING ON MEAL SIZE) UNDER THE SKIN 3 TIMES A DAY (Patient taking differently: Inject 20-40 Units into the skin 3 (three) times daily with meals. INJECT 12-15 UNITS (DEPENDING ON MEAL SIZE) UNDER THE SKIN 3 TIMES A DAYINJECT 12-15 UNITS (DEPENDING ON MEAL SIZE) UNDER THE SKIN 3 TIMES A DAY) 45 mL 3   insulin  glargine (LANTUS  SOLOSTAR) 100 UNIT/ML Solostar Pen Inject 30 Units into the skin daily.     Insulin  Pen Needle 32G X 4 MM MISC Use to inject insulin  4 times daily 400 each 3   meclizine  (ANTIVERT ) 25 MG tablet Take 1 tablet (25 mg total) by mouth 3 (three) times daily as needed for dizziness. 30 tablet 0   metFORMIN  (GLUCOPHAGE -XR) 750 MG 24 hr tablet TAKE 1 TABLET BY MOUTH TWICE A DAY 180 tablet 3   Multiple Vitamin (MULTIVITAMIN PO) Take 1 tablet by mouth daily at 6 (six) AM.     rosuvastatin  (CRESTOR ) 20 MG tablet TAKE 1 TABLET DAILY 90 tablet 1   scopolamine  (TRANSDERM-SCOP) 1 MG/3DAYS Place 1 patch (1 mg total) onto the skin every 3 (three) days. 4 patch 0   spironolactone  (ALDACTONE ) 50 MG tablet Take 1 tablet (50 mg total) by mouth 2 (two) times daily. 180 tablet 3   tirzepatide  (MOUNJARO ) 10 MG/0.5ML Pen Inject 10 mg into the skin once a week. 6 mL 1   No current facility-administered medications for this visit.  Allergies  Allergen Reactions   Lisinopril  Swelling    Swelling of the left side of tongue   Lipitor [Atorvastatin ] Other (See Comments)    Myalgia/foot cramping   Family History  Problem Relation Age of Onset   Obesity Mother    Colon polyps Mother    Cancer Mother        breast   Hyperlipidemia Mother    Heart disease Mother    Hypertension Mother    Mental illness Mother         depression   Diabetes Mother    Alcoholism Father    Hyperlipidemia Father    Stroke Father    Hypertension Father    Cancer Maternal Grandmother        colon   Diabetes Maternal Grandmother    Diabetes Maternal Aunt    Glaucoma Maternal Aunt    Colon polyps Maternal Uncle    Colon cancer Maternal Uncle    Diabetes Maternal Uncle    Diabetes Maternal Uncle    Esophageal cancer Neg Hx    Rectal cancer Neg Hx    Stomach cancer Neg Hx    PE: There were no vitals taken for this visit. Wt Readings from Last 15 Encounters:  03/24/24 252 lb (114.3 kg)  01/06/24 251 lb 3.2 oz (113.9 kg)  12/20/23 249 lb 9.6 oz (113.2 kg)  09/12/23 262 lb (118.8 kg)  08/23/23 260 lb (117.9 kg)  07/09/23 262 lb (118.8 kg)  03/18/23 254 lb 9.6 oz (115.5 kg)  02/20/23 259 lb (117.5 kg)  07/16/22 255 lb (115.7 kg)  06/15/22 265 lb (120.2 kg)  04/13/22 266 lb (120.7 kg)  03/16/22 277 lb (125.6 kg)  09/28/21 270 lb 9.6 oz (122.7 kg)  05/31/21 275 lb (124.7 kg)  01/23/21 281 lb (127.5 kg)   Constitutional: overweight, in NAD Eyes:  EOMI, no exophthalmos ENT: no neck masses, no cervical lymphadenopathy Cardiovascular: RRR (no tachycardia at the time of the physical exam), No MRG Respiratory: CTA B Musculoskeletal: no deformities Skin:no rashes Neurological: no tremor with outstretched hands  ASSESSMENT: 1. DM2, insulin -dependent, controlled, without long-term complications, but with hyperglycemia  2. PCOS Component     Latest Ref Rng 10/16/2013  Testosterone      10 - 70 ng/dL 54  Sex Hormone Binding     18 - 114 nmol/L 12 (L)  Testosterone  Free     0.6 - 6.8 pg/mL 15.5 (H)  Testosterone -% Free     0.4 - 2.4 % 2.9 (H)  TSH     0.35 - 4.50 uIU/mL 0.54  17-OH-Progesterone, LC/MS/MS      <8  Free T4     0.60 - 1.60 ng/dL 9.15  T3, Free     2.3 - 4.2 pg/mL 3.0   Labs confirmed PCOS (high testosterone , low 17-HO Prog, normal TFTs).  LH and FSH were not tested as she was on the Mirena  IUD.  3. HL  PLAN:  1. DM2 - Patient with longstanding, uncontrolled type 2 diabetes, on metformin , SGLT2 inhibitor, weekly GLP-1/GIP receptor agonist and basal-bolus insulin , with still suboptimal control.  At last visit, HbA1c was higher, at 7.4%.  At that time, I advised her to try to switch from Wegovy  to tirzepatide  and we decreased the dose of NovoLog  while increasing the dose of Lantus  since sugars were fluctuating around the upper limit of the target range overnight with an increase in blood sugars even before eating breakfast and decreased to the target range or  slightly above afterwards.  Sugars are again increasing after dinner. - Sugars were higher in the 2 weeks prior to the visit, likely due to prednisone .  I advised her to use a higher dose of Lantus  while on steroids but to decrease the dose after coming off.  At that time, she already adjusted the NovoLog  up to cover postprandial excursions while on prednisone . CGM interpretation: -At today's visit, we reviewed her CGM downloads: It appears that *** of values are in target range (goal >70%), while *** are higher than 180 (goal <25%), and *** are lower than 70 (goal <4%).  The calculated average blood sugar is ***.  The projected HbA1c for the next 3 months (GMI) is ***. -Reviewing the CGM trends, ***  -  I suggested to:  Patient Instructions  Please continue: - Metformin  ER 750 mg 2x a day - Farxiga  10 mg in am, before b'fast - Tirzepatide  10 mg weekly  - Lantus  30 units daily in am - Novolog  10-15 units 15 min before meals  Please return in 4 months.  - we checked her HbA1c: 7%  - advised to check sugars at different times of the day - 4x a day, rotating check times - advised for yearly eye exams >> she is UTD - return to clinic in 3-4 months  2. PCOS - Patient with clinically evident PCOS, confirmed biochemically in 2015, now perimenopausal - She was on oral contraceptives (Ortho-Cyclen), tolerated well >> then  Nexplanon - She continues on metformin  ER and spironolactone .  She had a slightly high potassium in 2019 while on a potassium supplement, which we stopped.  He is also on an SGLT2 inhibitor, which can raise potassium.  Latest potassium level was normal: Lab Results  Component Value Date   K 5.0 03/24/2024  - She decided against weight loss surgery - At last visit we switched from Wegovy  to Mounjaro  for stronger effect on blood sugars and weight.  She tolerates this well.  3. HL - Latest lipid panel was reviewed from 02/2024: Fractions at goal: Lab Results  Component Value Date   CHOL 173 03/24/2024   HDL 54.90 03/24/2024   LDLCALC 98 03/24/2024   TRIG 103.0 03/24/2024   CHOLHDL 3 03/24/2024  -She continues on Crestor  20 mg daily.  She had muscle cramps with this in the past but improved after increasing hydration.  Lela Fendt, MD PhD [facility] Endocrinology

## 2024-06-09 ENCOUNTER — Encounter: Payer: Self-pay | Admitting: Family

## 2024-06-09 DIAGNOSIS — F902 Attention-deficit hyperactivity disorder, combined type: Secondary | ICD-10-CM

## 2024-06-09 MED ORDER — AMPHETAMINE-DEXTROAMPHET ER 30 MG PO CP24: 30.0000 mg | ORAL_CAPSULE | ORAL | 0 refills | Status: AC

## 2024-06-09 NOTE — Telephone Encounter (Signed)
 Requesting: Adderall XR 30mg   Contract:12/20/23 UDS:12/20/23 Last Visit: 03/24/24 Next Visit: 06/24/24 Last Refill: 04/28/24 #30 and 0RF   Please Advise

## 2024-06-12 ENCOUNTER — Ambulatory Visit: Admitting: Sports Medicine

## 2024-06-14 ENCOUNTER — Other Ambulatory Visit: Payer: Self-pay | Admitting: Family

## 2024-06-14 DIAGNOSIS — T753XXA Motion sickness, initial encounter: Secondary | ICD-10-CM

## 2024-06-18 ENCOUNTER — Encounter: Payer: Self-pay | Admitting: Internal Medicine

## 2024-06-18 ENCOUNTER — Ambulatory Visit: Admitting: Internal Medicine

## 2024-06-18 VITALS — BP 120/60 | HR 99 | Ht 65.0 in | Wt 248.6 lb

## 2024-06-18 DIAGNOSIS — E1165 Type 2 diabetes mellitus with hyperglycemia: Secondary | ICD-10-CM | POA: Diagnosis not present

## 2024-06-18 DIAGNOSIS — E785 Hyperlipidemia, unspecified: Secondary | ICD-10-CM

## 2024-06-18 DIAGNOSIS — Z7984 Long term (current) use of oral hypoglycemic drugs: Secondary | ICD-10-CM

## 2024-06-18 DIAGNOSIS — Z794 Long term (current) use of insulin: Secondary | ICD-10-CM

## 2024-06-18 DIAGNOSIS — E282 Polycystic ovarian syndrome: Secondary | ICD-10-CM

## 2024-06-18 DIAGNOSIS — Z7985 Long-term (current) use of injectable non-insulin antidiabetic drugs: Secondary | ICD-10-CM

## 2024-06-18 LAB — POCT GLYCOSYLATED HEMOGLOBIN (HGB A1C): Hemoglobin A1C: 7.2 % — AB (ref 4.0–5.6)

## 2024-06-18 NOTE — Progress Notes (Signed)
 Patient ID: Audrey Goodman, female   DOB: Dec 25, 1970, 54 y.o.   MRN: [id number]  This note was precharted 05/22/2024.  HPI: Audrey Goodman is a 54 y.o.-year-old female, returning for f/u for DM2, dx with GDM in 1995, then DM 6 mo after son was born, insulin -dependent since ~2010, controlled, without long-term complications and PCOS. Last visit 4 months ago.  Interim history: No increased urination, blurry vision, nausea, chest pain.  She has a history of steroids p.o. and injections for sciatica and also for hip pain.  She walks with a cane. She lost 10 lbs before last visit.  She lost 3 pounds since then.   She just returned from a [place] cruise that she took last month. She had the flu last month for a week.  So She will be moving to [place]  in 09/2024.  She works from home.  She will be associated with [facility].  Reviewed HbA1c levels: Lab Results  Component Value Date   HGBA1C 7.4 (A) 01/06/2024   HGBA1C 7.1 (H) 08/23/2023   HGBA1C 7.2 (H) 02/20/2023  06/07/2022: HbA1c 7.2% 05/31/2021: HbA1c 7.0%  At last visit she was on: - Metformin  ER 750 mg 2x a day - Farxiga  10 mg in am, before b'fast - Lantus  20 units at bedtime >> split 10 units 2x day >> 10 units in am and 6 units at night - Novolog  8-15 >> 10-20 >> 20-40 >> 15-25 units before meals.  - Trulicity  4.5 mg weekly >> Wegovy  2.4 mg weekly Of note, she could not tolerate Jardiance  10 (started 01/2018) because of increased urination.  We changed to: - Metformin  ER 750 mg 2x a day >> 1500 mg daily in am - Farxiga  10 mg in am, before b'fast - Tirzepatide  5 >> .SABRA. 10 mg weekly  - Lantus  30 units daily in am - Novolog  10-15 units 15 min before meals >> actually using B: 6-10 units L: 0-10 units D: 6 units  Pt checks her sugars >4x a day:  Previously  Previously:   Lowest sugar was  54 >> 84 >> 50s >> 60s >> 64; she has hypoglycemia awareness in the 80s. Highest sugar was 501 (streroid) >> .SABRA.288 >> 201 >>  200s.  + CKD with proteinuria-sees nephrology (Dr. Gearline), last BUN/creatinine:  Lab Results  Component Value Date   BUN 16 03/24/2024   CREATININE 1.10 03/24/2024   Lab Results  Component Value Date   MICRALBCREAT n/c 09/12/2023   MICRALBCREAT 10 03/20/2023   MICRALBCREAT 7 06/08/2022   MICRALBCREAT 11 03/21/2020  She is off lisinopril .  + HL; last set of lipids: Lab Results  Component Value Date   CHOL 173 03/24/2024   HDL 54.90 03/24/2024   LDLCALC 98 03/24/2024   TRIG 103.0 03/24/2024   CHOLHDL 3 03/24/2024  She had leg cramps with Crestor  10. Started drinking more water >> helped.  - last eye exam was: 02/05/2024-no DR.  - no numbness and tingling in her feet.  Last foot exam 09/12/2023. On Gabapentin.  PCOS: -She had irregular menses since menarche -+1 pregnancies, no miscarriages - was on Mirena IUD - for ~4.5 years >> then on Sprintec since 2016 >> then Orthocycen >> now Nexplanon since 2020 - She continues on metformin  ER 750 mg twice a day and spironolactone  50 mg twice a day.  She feels that this helps with her hirsutism.  Latest potassium level is normal: Lab Results  Component Value Date   K 5.0 03/24/2024  She was previously seen with weight management clinic at [facility] - nonsurgical weight loss program.  She lost 20 pounds on their diet but could not be continue due to price.   ROS: + see HPI  I reviewed pt's medications, allergies, PMH, social hx, family hx, and changes were documented in the history of present illness. Otherwise, unchanged from my initial visit note.  Past Medical History:  Diagnosis Date   Anxiety    Arthritis    left shoulder   Back pain    Chronic kidney disease    stage 1   Depression    Diabetes mellitus without complication (HCC)    type 2- on meds   Diverticulitis    High cholesterol    History of chicken pox    Hyperlipidemia    on meds   Hypertension    on meds   Joint pain    Lactose intolerance    PCOS  (polycystic ovarian syndrome)    Seasonal allergies    Shingles    Sleep apnea    does NOT use CPAP   SVD (spontaneous vaginal delivery) 1995   x 1   Type 2 diabetes mellitus, uncontrolled 07/30/2006   Qualifier: Diagnosis of  By: Lucille  MD, Preston     Past Surgical History:  Procedure Laterality Date   ANKLE FRACTURE SURGERY  05/28/1989   right   CHOLECYSTECTOMY  05/28/2006   COLONOSCOPY  1989   due to blood in stool and diverticulitis per pt   HYSTEROSCOPY N/A 09/13/2014   Procedure: HYSTEROSCOPY with IUD removal;  Surgeon: Duwaine Blumenthal, DO;  Location: [facility];  Service: Gynecology;  Laterality: N/A;  need longest speculum and instruments that aresavailable due to patient's habitus (BMI ~60)   Social History   Socioeconomic History   Marital status: Single    Spouse name: Not on file   Number of children: Not on file   Years of education: Not on file   Highest education level: Bachelor's degree (e.g., BA, AB, BS)  Occupational History   Not on file  Tobacco Use   Smoking status: Never   Smokeless tobacco: Never  Vaping Use   Vaping status: Never Used  Substance and Sexual Activity   Alcohol use: No    Alcohol/week: 0.0 standard drinks of alcohol   Drug use: No   Sexual activity: Yes    Partners: Male    Birth control/protection: Pill  Other Topics Concern   Not on file  Social History Narrative   Single- lives alone   Son 21- senior at [employer]   Enjoys reading   Works as a advertising account planner at [employer]   Tobacco Use: Low Risk (01/06/2024)   Patient History    Smoking Tobacco Use: Never    Smokeless Tobacco Use: Never    Passive Exposure: Not on file  Financial Resource Strain: Low Risk (03/17/2024)   Overall Financial Resource Strain (CARDIA)    Difficulty of Paying Living Expenses: Not very hard  Food Insecurity: No Food Insecurity (03/17/2024)   Epic    Worried About Radiation Protection Practitioner of Food in the Last Year: Never true     Ran Out of Food in the Last Year: Never true  Transportation Needs: No Transportation Needs (03/17/2024)   Epic    Lack of Transportation (Medical): No    Lack of Transportation (Non-Medical): No  Physical Activity: Insufficiently Active (03/17/2024)   Exercise Vital Sign  Days of Exercise per Week: 2 days    Minutes of Exercise per Session: 30 min  Stress: No Stress Concern Present (03/17/2024)   [employer] of Occupational Health - Occupational Stress Questionnaire    Feeling of Stress: Only a little  Social Connections: Unknown (03/17/2024)   Social Connection and Isolation Panel    Frequency of Communication with Friends and Family: Twice a week    Frequency of Social Gatherings with Friends and Family: Patient declined    Attends Religious Services: 1 to 4 times per year    Active Member of Clubs or Organizations: No    Attends Engineer, Structural: Not on file    Marital Status: Never married  Intimate Partner Violence: Not on file  Depression (PHQ2-9): Medium Risk (03/24/2024)   Depression (PHQ2-9)    PHQ-2 Score: 5  Alcohol Screen: Low Risk (03/17/2024)   Alcohol Screen    Last Alcohol Screening Score (AUDIT): 1  Housing: High Risk (03/17/2024)   Epic    Unable to Pay for Housing in the Last Year: Yes    Number of Times Moved in the Last Year: 0    Homeless in the Last Year: No  Utilities: Not on file  Health Literacy: Not on file   Current Outpatient Medications  Medication Sig Dispense Refill   amLODipine  (NORVASC ) 5 MG tablet Take 1 tablet (5 mg total) by mouth daily. 90 tablet 0   amphetamine -dextroamphetamine  (ADDERALL XR) 30 MG 24 hr capsule Take 1 capsule (30 mg total) by mouth every morning. 30 capsule 0   amphetamine -dextroamphetamine  (ADDERALL) 5 MG tablet Take 1 tablet (5 mg total) by mouth daily. 30 tablet 0   celecoxib  (CELEBREX ) 100 MG capsule Take 1 capsule (100 mg total) by mouth 2 (two) times daily. 60 capsule 1   cetirizine (ZYRTEC)  10 MG tablet Take 10 mg by mouth daily.     Continuous Glucose Sensor (DEXCOM G7 SENSOR) MISC USE TO CHECK GLUCOSE CONTINUOUSLY, CHANGE EVERY 10 DAYS 3 each 3   dapagliflozin  propanediol (FARXIGA ) 10 MG TABS tablet Take 1 tablet (10 mg total) by mouth daily. Fill as generic. 30 tablet 11   FLUoxetine  (PROZAC ) 20 MG capsule Take 2 capsules (40 mg total) by mouth daily. 180 capsule 1   fluticasone  (FLONASE ) 50 MCG/ACT nasal spray Place 2 sprays into both nostrils daily. 48 g 1   insulin  aspart (NOVOLOG  FLEXPEN) 100 UNIT/ML FlexPen Inject 12-15 Units into the skin 3 (three) times daily with meals. INJECT 12-15 UNITS (DEPENDING ON MEAL SIZE) UNDER THE SKIN 3 TIMES A DAYINJECT 12-15 UNITS (DEPENDING ON MEAL SIZE) UNDER THE SKIN 3 TIMES A DAY (Patient taking differently: Inject 20-40 Units into the skin 3 (three) times daily with meals. INJECT 12-15 UNITS (DEPENDING ON MEAL SIZE) UNDER THE SKIN 3 TIMES A DAYINJECT 12-15 UNITS (DEPENDING ON MEAL SIZE) UNDER THE SKIN 3 TIMES A DAY) 45 mL 3   insulin  glargine (LANTUS  SOLOSTAR) 100 UNIT/ML Solostar Pen Inject 30 Units into the skin daily.     Insulin  Pen Needle 32G X 4 MM MISC Use to inject insulin  4 times daily 400 each 3   meclizine  (ANTIVERT ) 25 MG tablet Take 1 tablet (25 mg total) by mouth 3 (three) times daily as needed for dizziness. 30 tablet 0   metFORMIN  (GLUCOPHAGE -XR) 750 MG 24 hr tablet TAKE 1 TABLET BY MOUTH TWICE A DAY 180 tablet 3   Multiple Vitamin (MULTIVITAMIN PO) Take 1 tablet by mouth daily at 6 (  six) AM.     rosuvastatin  (CRESTOR ) 20 MG tablet TAKE 1 TABLET DAILY 90 tablet 1   scopolamine  (TRANSDERM-SCOP) 1 MG/3DAYS PLACE 1 PATCH (1 MG TOTAL) ONTO THE SKIN EVERY 3 (THREE) DAYS. 4 patch 0   spironolactone  (ALDACTONE ) 50 MG tablet Take 1 tablet (50 mg total) by mouth 2 (two) times daily. 180 tablet 3   tirzepatide  (MOUNJARO ) 10 MG/0.5ML Pen Inject 10 mg into the skin once a week. 6 mL 1   No current facility-administered medications for  this visit.    Allergies  Allergen Reactions   Lisinopril  Swelling    Swelling of the left side of tongue   Lipitor [Atorvastatin ] Other (See Comments)    Myalgia/foot cramping   Family History  Problem Relation Age of Onset   Obesity Mother    Colon polyps Mother    Cancer Mother        breast   Hyperlipidemia Mother    Heart disease Mother    Hypertension Mother    Mental illness Mother        depression   Diabetes Mother    Alcoholism Father    Hyperlipidemia Father    Stroke Father    Hypertension Father    Cancer Maternal Grandmother        colon   Diabetes Maternal Grandmother    Diabetes Maternal Aunt    Glaucoma Maternal Aunt    Colon polyps Maternal Uncle    Colon cancer Maternal Uncle    Diabetes Maternal Uncle    Diabetes Maternal Uncle    Esophageal cancer Neg Hx    Rectal cancer Neg Hx    Stomach cancer Neg Hx    PE: BP 120/60   Pulse 99   Ht 5' 5 (1.651 m)   Wt 248 lb 9.6 oz (112.8 kg)   SpO2 99%   BMI 41.37 kg/m  Wt Readings from Last 15 Encounters:  06/18/24 248 lb 9.6 oz (112.8 kg)  03/24/24 252 lb (114.3 kg)  01/06/24 251 lb 3.2 oz (113.9 kg)  12/20/23 249 lb 9.6 oz (113.2 kg)  09/12/23 262 lb (118.8 kg)  08/23/23 260 lb (117.9 kg)  07/09/23 262 lb (118.8 kg)  03/18/23 254 lb 9.6 oz (115.5 kg)  02/20/23 259 lb (117.5 kg)  07/16/22 255 lb (115.7 kg)  06/15/22 265 lb (120.2 kg)  04/13/22 266 lb (120.7 kg)  03/16/22 277 lb (125.6 kg)  09/28/21 270 lb 9.6 oz (122.7 kg)  05/31/21 275 lb (124.7 kg)   Constitutional: overweight, in NAD Eyes:  EOMI, no exophthalmos ENT: no neck masses, no cervical lymphadenopathy Cardiovascular: RRR (no tachycardia at the time of the physical exam), No MRG Respiratory: CTA B Musculoskeletal: no deformities Skin:no rashes Neurological: no tremor with outstretched hands  ASSESSMENT: 1. DM2, insulin -dependent, controlled, without long-term complications, but with hyperglycemia  2. PCOS Component      Latest Ref Rng 10/16/2013  Testosterone      10 - 70 ng/dL 54  Sex Hormone Binding     18 - 114 nmol/L 12 (L)  Testosterone  Free     0.6 - 6.8 pg/mL 15.5 (H)  Testosterone -% Free     0.4 - 2.4 % 2.9 (H)  TSH     0.35 - 4.50 uIU/mL 0.54  17-OH-Progesterone, LC/MS/MS      <8  Free T4     0.60 - 1.60 ng/dL 9.15  T3, Free     2.3 - 4.2 pg/mL 3.0   Labs confirmed PCOS (  high testosterone , low 17-HO Prog, normal TFTs).  LH and FSH were not tested as she was on the Mirena IUD.  3. HL  PLAN:  1. DM2 - Patient with longstanding, uncontrolled type 2 diabetes, on metformin , SGLT2 inhibitor, weekly GLP-1/GIP receptor agonist and basal-bolus insulin , with still suboptimal control.  At last visit, HbA1c was higher, at 7.4%.  At that time, I advised her to try to switch from Wegovy  to tirzepatide  and we decreased the dose of NovoLog  while increasing the dose of Lantus  since sugars were fluctuating around the upper limit of the target range overnight with an increase in blood sugars even before eating breakfast and decreased to the target range or slightly above afterwards.  Sugars are again increasing after dinner. - Sugars were higher in the 2 weeks prior to the visit, likely due to prednisone .  I advised her to use a higher dose of Lantus  while on steroids but to decrease the dose after coming off.  At that time, she already adjusted the NovoLog  up to cover postprandial excursions while on prednisone . CGM interpretation: -At today's visit, we reviewed her CGM downloads: It appears that 77% of values are in target range (goal >70%), while 23% are higher than 180 (goal <25%), and 0% are lower than 70 (goal <4%).  The calculated average blood sugar is 158.  The projected HbA1c for the next 3 months (GMI) is 7.1%. -Reviewing the CGM trends, sugars appear to be improved, with the majority of the blood sugars fluctuating within the target range with a few hyperglycemic exceptions particularly after lunch and  dinner.  She is tolerating the Mounjaro  well at the higher dose and she was able to reduce the doses of NovoLog , currently taking between 6 to 10 units before most meals, but occasionally skipping it especially for lunch.  We discussed about continuing to improve the diet and hopefully will be able to eliminate NovoLog  completely in the following months.  For now, I did not advise her to reduce the doses as sugars just started to improve after the holidays. -  I suggested to:  Patient Instructions  Please continue: - Metformin  ER 1500 mg in am - Farxiga  10 mg in am, before b'fast - Tirzepatide  10 mg weekly  - Lantus  30 units daily in am - Novolog  6-10 units 15 min before meals  Please return in 3 months.  - we checked her HbA1c: 7.2% (lower) - advised to check sugars at different times of the day - 4x a day, rotating check times - advised for yearly eye exams >> she is UTD - return to clinic in 3 months  2. PCOS - Patient with clinically evident PCOS, confirmed biochemically in 2015, now perimenopausal - She was on oral contraceptives (Ortho-Cyclen), tolerated well >> then Nexplanon - She continues on metformin  ER and spironolactone .  She had a slightly high potassium in 2019 while on a potassium supplement, which we stopped.  He is also on an SGLT2 inhibitor, which can raise potassium.  Latest potassium level was normal: Lab Results  Component Value Date   K 5.0 03/24/2024  - She decided against weight loss surgery - At last visit we switched from Wegovy  to Mounjaro  for stronger effect on blood sugars and weight.  She tolerates this well.  3. HL - Latest lipid panel was reviewed from 02/2024: Fractions at goal: Lab Results  Component Value Date   CHOL 173 03/24/2024   HDL 54.90 03/24/2024   LDLCALC 98 03/24/2024  TRIG 103.0 03/24/2024   CHOLHDL 3 03/24/2024  -She continues on Crestor  20 mg daily.  She had muscle cramps with this in the past but improved after increasing  hydration.  Lela Fendt, MD PhD [facility]

## 2024-06-18 NOTE — Patient Instructions (Addendum)
 Please continue: - Metformin  ER 1500 mg in am - Farxiga  10 mg in am, before b'fast - Tirzepatide  10 mg weekly  - Lantus  30 units daily in am - Novolog  6-10 units 15 min before meals  Please return in 3 months.

## 2024-06-24 ENCOUNTER — Ambulatory Visit: Admitting: Family

## 2024-06-27 ENCOUNTER — Other Ambulatory Visit: Payer: Self-pay | Admitting: Internal Medicine

## 2024-06-29 ENCOUNTER — Encounter: Payer: Self-pay | Admitting: Internal Medicine

## 2024-07-22 ENCOUNTER — Ambulatory Visit: Admitting: Family

## 2024-09-21 ENCOUNTER — Ambulatory Visit: Admitting: Internal Medicine
